# Patient Record
Sex: Female | Born: 1970 | Race: White | Hispanic: No | Marital: Married | Smoking: Current some day smoker
Health system: Southern US, Community
[De-identification: ages and names within clinical notes are randomized; demographics above are authoritative.]

## PROBLEM LIST (undated history)

## (undated) DIAGNOSIS — F419 Anxiety disorder, unspecified: Secondary | ICD-10-CM

## (undated) DIAGNOSIS — F32A Depression, unspecified: Secondary | ICD-10-CM

## (undated) DIAGNOSIS — R197 Diarrhea, unspecified: Secondary | ICD-10-CM

## (undated) DIAGNOSIS — J40 Bronchitis, not specified as acute or chronic: Secondary | ICD-10-CM

## (undated) DIAGNOSIS — R109 Unspecified abdominal pain: Secondary | ICD-10-CM

## (undated) DIAGNOSIS — Z720 Tobacco use: Secondary | ICD-10-CM

## (undated) DIAGNOSIS — J439 Emphysema, unspecified: Secondary | ICD-10-CM

## (undated) DIAGNOSIS — D696 Thrombocytopenia, unspecified: Secondary | ICD-10-CM

## (undated) DIAGNOSIS — R161 Splenomegaly, not elsewhere classified: Secondary | ICD-10-CM

## (undated) DIAGNOSIS — J449 Chronic obstructive pulmonary disease, unspecified: Secondary | ICD-10-CM

## (undated) DIAGNOSIS — Z9289 Personal history of other medical treatment: Secondary | ICD-10-CM

## (undated) DIAGNOSIS — D649 Anemia, unspecified: Secondary | ICD-10-CM

## (undated) DIAGNOSIS — K219 Gastro-esophageal reflux disease without esophagitis: Secondary | ICD-10-CM

## (undated) DIAGNOSIS — F329 Major depressive disorder, single episode, unspecified: Secondary | ICD-10-CM

## (undated) DIAGNOSIS — D469 Myelodysplastic syndrome, unspecified: Secondary | ICD-10-CM

## (undated) HISTORY — DX: Unspecified abdominal pain: R10.9

## (undated) HISTORY — PX: WISDOM TOOTH EXTRACTION: SHX21

## (undated) HISTORY — DX: Personal history of other medical treatment: Z92.89

## (undated) HISTORY — PX: UPPER GASTROINTESTINAL ENDOSCOPY: SHX188

## (undated) HISTORY — PX: GASTRIC BYPASS: SHX52

## (undated) HISTORY — DX: Bronchitis, not specified as acute or chronic: J40

## (undated) HISTORY — DX: Thrombocytopenia, unspecified: D69.6

## (undated) HISTORY — DX: Anemia, unspecified: D64.9

## (undated) HISTORY — DX: Anxiety disorder, unspecified: F41.9

## (undated) HISTORY — DX: Diarrhea, unspecified: R19.7

## (undated) HISTORY — PX: CHOLECYSTECTOMY: SHX55

## (undated) HISTORY — DX: Tobacco use: Z72.0

## (undated) HISTORY — PX: OTHER SURGICAL HISTORY: SHX169

## (undated) HISTORY — PX: BONE MARROW TRANSPLANT: SHX200

## (undated) HISTORY — PX: ABDOMINAL HYSTERECTOMY: SHX81

## (undated) HISTORY — DX: Myelodysplastic syndrome, unspecified: D46.9

## (undated) HISTORY — DX: Splenomegaly, not elsewhere classified: R16.1

---

## 2011-09-06 DIAGNOSIS — D219 Benign neoplasm of connective and other soft tissue, unspecified: Secondary | ICD-10-CM | POA: Insufficient documentation

## 2011-09-06 DIAGNOSIS — Z9884 Bariatric surgery status: Secondary | ICD-10-CM | POA: Insufficient documentation

## 2011-09-06 DIAGNOSIS — N83209 Unspecified ovarian cyst, unspecified side: Secondary | ICD-10-CM | POA: Insufficient documentation

## 2011-11-15 DIAGNOSIS — Z90710 Acquired absence of both cervix and uterus: Secondary | ICD-10-CM | POA: Insufficient documentation

## 2012-05-17 DIAGNOSIS — K911 Postgastric surgery syndromes: Secondary | ICD-10-CM | POA: Insufficient documentation

## 2012-05-17 DIAGNOSIS — Z833 Family history of diabetes mellitus: Secondary | ICD-10-CM | POA: Insufficient documentation

## 2013-04-12 ENCOUNTER — Encounter (HOSPITAL_COMMUNITY): Payer: Self-pay | Admitting: Emergency Medicine

## 2013-04-12 ENCOUNTER — Emergency Department (HOSPITAL_COMMUNITY)
Admission: EM | Admit: 2013-04-12 | Discharge: 2013-04-12 | Disposition: A | Discharge status: Home / Self Care | Payer: Medicaid Other | Attending: Emergency Medicine | Admitting: Emergency Medicine

## 2013-04-12 DIAGNOSIS — R45851 Suicidal ideations: Secondary | ICD-10-CM | POA: Insufficient documentation

## 2013-04-12 DIAGNOSIS — J449 Chronic obstructive pulmonary disease, unspecified: Secondary | ICD-10-CM | POA: Insufficient documentation

## 2013-04-12 DIAGNOSIS — F1010 Alcohol abuse, uncomplicated: Secondary | ICD-10-CM

## 2013-04-12 DIAGNOSIS — F329 Major depressive disorder, single episode, unspecified: Secondary | ICD-10-CM

## 2013-04-12 DIAGNOSIS — F3289 Other specified depressive episodes: Secondary | ICD-10-CM | POA: Insufficient documentation

## 2013-04-12 DIAGNOSIS — F32A Depression, unspecified: Secondary | ICD-10-CM

## 2013-04-12 DIAGNOSIS — K219 Gastro-esophageal reflux disease without esophagitis: Secondary | ICD-10-CM | POA: Insufficient documentation

## 2013-04-12 DIAGNOSIS — J4489 Other specified chronic obstructive pulmonary disease: Secondary | ICD-10-CM | POA: Insufficient documentation

## 2013-04-12 DIAGNOSIS — Z9884 Bariatric surgery status: Secondary | ICD-10-CM | POA: Insufficient documentation

## 2013-04-12 DIAGNOSIS — F17200 Nicotine dependence, unspecified, uncomplicated: Secondary | ICD-10-CM | POA: Insufficient documentation

## 2013-04-12 DIAGNOSIS — Z79899 Other long term (current) drug therapy: Secondary | ICD-10-CM | POA: Insufficient documentation

## 2013-04-12 HISTORY — DX: Emphysema, unspecified: J43.9

## 2013-04-12 HISTORY — DX: Major depressive disorder, single episode, unspecified: F32.9

## 2013-04-12 HISTORY — DX: Depression, unspecified: F32.A

## 2013-04-12 HISTORY — DX: Chronic obstructive pulmonary disease, unspecified: J44.9

## 2013-04-12 HISTORY — DX: Gastro-esophageal reflux disease without esophagitis: K21.9

## 2013-04-12 LAB — URINALYSIS, ROUTINE W REFLEX MICROSCOPIC
Leukocytes, UA: NEGATIVE
Nitrite: NEGATIVE
Protein, ur: NEGATIVE mg/dL
Specific Gravity, Urine: 1.011 (ref 1.005–1.030)
Urobilinogen, UA: 0.2 mg/dL (ref 0.0–1.0)

## 2013-04-12 LAB — COMPREHENSIVE METABOLIC PANEL WITH GFR
ALT: 15 U/L (ref 0–35)
AST: 28 U/L (ref 0–37)
Albumin: 3.8 g/dL (ref 3.5–5.2)
Alkaline Phosphatase: 88 U/L (ref 39–117)
BUN: 8 mg/dL (ref 6–23)
CO2: 24 meq/L (ref 19–32)
Calcium: 8.6 mg/dL (ref 8.4–10.5)
Chloride: 100 meq/L (ref 96–112)
Creatinine, Ser: 0.63 mg/dL (ref 0.50–1.10)
GFR calc Af Amer: >90 mL/min
GFR calc non Af Amer: >90 mL/min
Glucose, Bld: 98 mg/dL (ref 70–99)
Potassium: 3.6 meq/L (ref 3.5–5.1)
Sodium: 135 meq/L (ref 135–145)
Total Bilirubin: 0.2 mg/dL — ABNORMAL LOW (ref 0.3–1.2)
Total Protein: 7.4 g/dL (ref 6.0–8.3)

## 2013-04-12 LAB — RAPID URINE DRUG SCREEN, HOSP PERFORMED
Amphetamines: NOT DETECTED
Barbiturates: NOT DETECTED
Benzodiazepines: NOT DETECTED
Cocaine: NOT DETECTED
Opiates: NOT DETECTED
Tetrahydrocannabinol: NOT DETECTED

## 2013-04-12 LAB — CBC
HCT: 38.7 % (ref 36.0–46.0)
Hemoglobin: 12.2 g/dL (ref 12.0–15.0)
MCH: 24 pg — ABNORMAL LOW (ref 26.0–34.0)
MCHC: 31.5 g/dL (ref 30.0–36.0)
MCV: 76.2 fL — ABNORMAL LOW (ref 78.0–100.0)
Platelets: 211 K/uL (ref 150–400)
RBC: 5.08 MIL/uL (ref 3.87–5.11)
RDW: 15.5 % (ref 11.5–15.5)
WBC: 5.9 K/uL (ref 4.0–10.5)

## 2013-04-12 MED ORDER — ZOLPIDEM TARTRATE 5 MG PO TABS: 5.0000 mg | ORAL_TABLET | Freq: Every evening | ORAL | Status: DC | PRN

## 2013-04-12 MED ORDER — NICOTINE 21 MG/24HR TD PT24: 21.0000 mg | MEDICATED_PATCH | Freq: Every day | TRANSDERMAL | Status: DC

## 2013-04-12 MED ORDER — ALUM & MAG HYDROXIDE-SIMETH 200-200-20 MG/5ML PO SUSP: 30.0000 mL | ORAL | Status: DC | PRN

## 2013-04-12 MED ORDER — IBUPROFEN 600 MG PO TABS: 600.0000 mg | ORAL_TABLET | Freq: Three times a day (TID) | ORAL | Status: DC | PRN

## 2013-04-12 MED ORDER — ACETAMINOPHEN 325 MG PO TABS: 650.0000 mg | ORAL_TABLET | ORAL | Status: DC | PRN

## 2013-04-12 MED ORDER — ONDANSETRON HCL 4 MG PO TABS: 4.0000 mg | ORAL_TABLET | Freq: Three times a day (TID) | ORAL | Status: DC | PRN

## 2013-04-12 MED ORDER — LORAZEPAM 1 MG PO TABS
1.0000 mg | ORAL_TABLET | Freq: Three times a day (TID) | ORAL | Status: DC | PRN
  Administered 2013-04-12: 1 mg via ORAL
  Filled 2013-04-12: qty 1

## 2013-04-12 NOTE — ED Provider Notes (Signed)
Medical screening examination/treatment/procedure(s) were performed by non-physician practitioner and as supervising physician I was immediately available for consultation/collaboration.  Martha K Linker, MD 04/12/13 0633 

## 2013-04-12 NOTE — Progress Notes (Signed)
Chaplain distressed and anxious after making phone call.  Had spoken with boyfriend, who wishes her to leave children with friend and spend time in [place].  Pt does not wish to go to [place].   Chaplain provided support around pt's upcoming decision and conversation with boyfriend.  Spoke with chaplain about history of behavioral health, having been in psychiatric hospitals in [place] from childhood through 48 y.  Pt described being on a number of medications.  Had stopped medication at 30 and felt she had been healthier.  Now back on medication, she is fearful that "this will snowball" and she will have to take more medications.  Has spoken with therapist about this.  Expressed fears that "the person that I was came out again last night."   Pt spoke with chaplain about her new faith and where this fits into discerning her next steps.    Belva Crome MDiv.

## 2013-04-12 NOTE — ED Notes (Signed)
Patient arrived to unit. Pt denies SI/HI but states "I was drinking and I said some stupid things to my pastor, things I shouldn't have". Pt states she made statements that may have scared him and he called [employer]. Pt asking to leave asap, and that she is "sober now".

## 2013-04-12 NOTE — Consult Note (Signed)
[facility] Psychiatry Consult   Reason for Consult:Ms Barbara Stanley drank 4-5 drinks with a friend last night on top of taking Latuda and had a bad reaction blacking out and making suicidal threats. Referring Physician:  ER physician  Shaneese Tait is an 42 y.o. female.  Assessment: AXIS I:  Alcohol Abuse AXIS II:  Deferred AXIS III:   Past Medical History  Diagnosis Date  . Depression   . COPD (chronic obstructive pulmonary disease)   . Emphysema   . GERD (gastroesophageal reflux disease)    AXIS IV:  other psychosocial or environmental problems AXIS V:  61-70 mild symptoms  Plan:  No evidence of imminent risk to self or others at present.   Patient does not meet criteria for psychiatric inpatient admission. Supportive therapy provided about ongoing stressors. Discussed crisis plan, support from social network, calling 911, coming to the Emergency Department, and calling Suicide Hotline.  Subjective:   Barbara Stanley is a 42 y.o. female patient admitted with having been drinking and made suicidal threats she cannot recall making.  HPI:  Ms Barbara Stanley related the story above.  Said years ago she was a problem drinker but stopped 12 years ago.  Last night she did not plan to drink that much but after a few drinks she does not recall all that happened.  Has no recall of making suicidal threats but does recall talking to her pastor.  Today she says she has absolutely no desire to kill herself and feels very bad about what happened.  She has been diagnosed with Bipolar DO and the Latuda has been helpful.  She has children and a supportive boyfriend and wants to be discharged. HPI Elements:   Location:  ER. Quality:  normally does not drink. Severity:  severe response to the combination of meds and alcohol. Timing:  a one time thing that will not happen again, she says. Duration:  has not drunk for 12 yrs. Context:  has been stressed recently.  Past Psychiatric History: Past Medical History   Diagnosis Date  . Depression   . COPD (chronic obstructive pulmonary disease)   . Emphysema   . GERD (gastroesophageal reflux disease)     reports that she has been smoking Cigarettes.  She has been smoking about 0.00 packs per day. She does not have any smokeless tobacco history on file. She reports that  drinks alcohol. She reports that she does not use illicit drugs. Family History  Problem Relation Age of Onset  . Diabetes Mother   . Diabetes Other   . Hypertension Other   . CAD Other            Allergies:  No Known Allergies  Past Psychiatric History: Diagnosis:  Alcohol abuse  Hospitalizations:  When younger, none for 15 years  Outpatient Care:  Sees and provider and takes Jordan  Substance Abuse Care:  none  Self-Mutilation:  none  Suicidal Attempts:  none  Violent Behaviors:  none   Objective: Blood pressure 121/77, pulse 77, temperature 97.6 F (36.4 C), temperature source Oral, resp. rate 16, SpO2 100.00%.There is no height or weight on file to calculate BMI. Results for orders placed during the hospital encounter of 04/12/13 (from the past 72 hour(s))  URINE RAPID DRUG SCREEN (HOSP PERFORMED)     Status: None   Collection Time    04/12/13  3:28 AM      Result Value Range   Opiates NONE DETECTED  NONE DETECTED   Cocaine NONE DETECTED  NONE  DETECTED   Benzodiazepines NONE DETECTED  NONE DETECTED   Amphetamines NONE DETECTED  NONE DETECTED   Tetrahydrocannabinol NONE DETECTED  NONE DETECTED   Barbiturates NONE DETECTED  NONE DETECTED   Comment:            DRUG SCREEN FOR MEDICAL PURPOSES     ONLY.  IF CONFIRMATION IS NEEDED     FOR ANY PURPOSE, NOTIFY LAB     WITHIN 5 DAYS.                LOWEST DETECTABLE LIMITS     FOR URINE DRUG SCREEN     Drug Class       Cutoff (ng/mL)     Amphetamine      1000     Barbiturate      200     Benzodiazepine   200     Tricyclics       300     Opiates          300     Cocaine          300     THC              50   URINALYSIS, ROUTINE W REFLEX MICROSCOPIC     Status: None   Collection Time    04/12/13  3:28 AM      Result Value Range   Color, Urine YELLOW  YELLOW   APPearance CLEAR  CLEAR   Specific Gravity, Urine 1.011  1.005 - 1.030   pH 5.5  5.0 - 8.0   Glucose, UA NEGATIVE  NEGATIVE mg/dL   Hgb urine dipstick NEGATIVE  NEGATIVE   Bilirubin Urine NEGATIVE  NEGATIVE   Ketones, ur NEGATIVE  NEGATIVE mg/dL   Protein, ur NEGATIVE  NEGATIVE mg/dL   Urobilinogen, UA 0.2  0.0 - 1.0 mg/dL   Nitrite NEGATIVE  NEGATIVE   Leukocytes, UA NEGATIVE  NEGATIVE   Comment: MICROSCOPIC NOT DONE ON URINES WITH NEGATIVE PROTEIN, BLOOD, LEUKOCYTES, NITRITE, OR GLUCOSE <1000 mg/dL.  CBC     Status: Abnormal   Collection Time    04/12/13  4:02 AM      Result Value Range   WBC 5.9  4.0 - 10.5 K/uL   RBC 5.08  3.87 - 5.11 MIL/uL   Hemoglobin 12.2  12.0 - 15.0 g/dL   HCT 16.1  09.6 - 04.5 %   MCV 76.2 (*) 78.0 - 100.0 fL   MCH 24.0 (*) 26.0 - 34.0 pg   MCHC 31.5  30.0 - 36.0 g/dL   RDW 40.9  81.1 - 91.4 %   Platelets 211  150 - 400 K/uL  COMPREHENSIVE METABOLIC PANEL     Status: Abnormal   Collection Time    04/12/13  4:02 AM      Result Value Range   Sodium 135  135 - 145 mEq/L   Potassium 3.6  3.5 - 5.1 mEq/L   Chloride 100  96 - 112 mEq/L   CO2 24  19 - 32 mEq/L   Glucose, Bld 98  70 - 99 mg/dL   BUN 8  6 - 23 mg/dL   Creatinine, Ser 7.82  0.50 - 1.10 mg/dL   Calcium 8.6  8.4 - 95.6 mg/dL   Total Protein 7.4  6.0 - 8.3 g/dL   Albumin 3.8  3.5 - 5.2 g/dL   AST 28  0 - 37 U/L   ALT 15  0 - 35 U/L  Alkaline Phosphatase 88  39 - 117 U/L   Total Bilirubin 0.2 (*) 0.3 - 1.2 mg/dL   GFR calc non Af Amer >90  >90 mL/min   GFR calc Af Amer >90  >90 mL/min   Comment:            The eGFR has been calculated     using the CKD EPI equation.     This calculation has not been     validated in all clinical     situations.     eGFR's persistently     <90 mL/min signify     possible Chronic Kidney  Disease.  ETHANOL     Status: Abnormal   Collection Time    04/12/13  4:02 AM      Result Value Range   Alcohol, Ethyl (B) 119 (*) 0 - 11 mg/dL   Comment:            LOWEST DETECTABLE LIMIT FOR     SERUM ALCOHOL IS 11 mg/dL     FOR MEDICAL PURPOSES ONLY   Labs are reviewed and are pertinent for any psychiatric issue.  Current Facility-Administered Medications  Medication Dose Route Frequency Provider Last Rate Last Dose  . acetaminophen (TYLENOL) tablet 650 mg  650 mg Oral Q4H PRN Antony Madura, PA-C      . alum & mag hydroxide-simeth (MAALOX/MYLANTA) 200-200-20 MG/5ML suspension 30 mL  30 mL Oral PRN Antony Madura, PA-C      . ibuprofen (ADVIL,MOTRIN) tablet 600 mg  600 mg Oral Q8H PRN Antony Madura, PA-C      . LORazepam (ATIVAN) tablet 1 mg  1 mg Oral Q8H PRN Antony Madura, PA-C      . nicotine (NICODERM CQ - dosed in mg/24 hours) patch 21 mg  21 mg Transdermal Daily Antony Madura, PA-C      . ondansetron [facility]) tablet 4 mg  4 mg Oral Q8H PRN Antony Madura, PA-C      . zolpidem (AMBIEN) tablet 5 mg  5 mg Oral QHS PRN Antony Madura, PA-C       Current Outpatient Prescriptions  Medication Sig Dispense Refill  . lurasidone (LATUDA) 40 MG TABS Take 40 mg by mouth daily with breakfast.      . omeprazole (PRILOSEC) 20 MG capsule Take 20 mg by mouth daily.        Psychiatric Specialty Exam: Physical Exam  ROS  Blood pressure 121/77, pulse 77, temperature 97.6 F (36.4 C), temperature source Oral, resp. rate 16, SpO2 100.00%.There is no height or weight on file to calculate BMI.  General Appearance: Fairly Groomed  Patent attorney::  Good  Speech:  Clear and Coherent and Normal Rate  Volume:  Normal  Mood:  Euthymic  Affect:  Appropriate  Thought Process:  Coherent and Goal Directed  Orientation:  Full (Time, Place, and Person)  Thought Content:  Negative  Suicidal Thoughts:  No  Homicidal Thoughts:  No  Memory:  Immediate;   Good Recent;   Good Remote;   Good  Judgement:  Good  Insight:   Good  Psychomotor Activity:  Normal  Concentration:  Good  Recall:  Good  Akathisia:  Negative  Handed:  Right  AIMS (if indicated):     Assets:  Communication Skills Desire for Improvement Financial Resources/Insurance Housing Intimacy Leisure Time Physical Health Resilience Social Support Transportation Vocational/Educational  Sleep:      Treatment Plan Summary: Daily contact with patient to assess and evaluate symptoms and progress  in treatment Medication management she will follow up with her outpt provider  TAYLOR,GERALD D 04/12/2013 9:27 AM

## 2013-04-12 NOTE — ED Provider Notes (Signed)
Transfer of care from Antony Madura, PA-C at 6:19AM. Barbara Stanley is a 41 y/o F with PMHx of depression, COPD, GERD presenting to the ED with suicidal thoughts. Patient in psych ED at [facility]. Telepsych ordered.   Raymon Mutton, PA-C 04/12/13 1840

## 2013-04-12 NOTE — ED Notes (Signed)
Pt's silver colored watch, $15 and a cell phone locked up in security.

## 2013-04-12 NOTE — ED Notes (Signed)
Pt states she is struggling with depression and is supposed to take medication for it but has been out of her meds for the past several days  Pt states she was talking to her pastor tonight and he called [employer] so they went out to check on the pt and brought her here

## 2013-04-12 NOTE — ED Provider Notes (Signed)
History    CSN: [id number] Arrival date & time 04/12/13  0309  First MD Initiated Contact with Patient 04/12/13 [phone]     Chief Complaint  Patient presents with  . Medical Clearance   (Consider location/radiation/quality/duration/timing/severity/associated sxs/prior Treatment) HPI Comments: Patient is a 42 year old female with a history of depression who presents for thoughts of suicide. Patient states that she was drinking Darrel Reach with one of her neighbors tonight and began to feel depressed. Patient subsequently called her pastor. While she does not recall the exact conversation she remembers having intermittent suicidal thoughts. Pastor contacted [place] [employer] who arrived at the patient's home and brought her to the emergency department. Patient denies current suicidal ideations or suicidal plan. She further denies homicidal ideation. Patient is followed by [facility] psychiatric clinic for depression and is supposed to be taking Jordan, but states she ran out of her samples and has not been able to get a new Rx. Patient denies illicit drug use. She denies any medical or pain complaints.  The history is provided by the patient. No language interpreter was used.   Past Medical History  Diagnosis Date  . Depression   . COPD (chronic obstructive pulmonary disease)   . Emphysema   . GERD (gastroesophageal reflux disease)    Past Surgical History  Procedure Laterality Date  . C section x 3     . Cholecystectomy    . Gastric bypass    . Abdominal hysterectomy     Family History  Problem Relation Age of Onset  . Diabetes Mother   . Diabetes Other   . Hypertension Other   . CAD Other    History  Substance Use Topics  . Smoking status: Current Every Day Smoker    Types: Cigarettes  . Smokeless tobacco: Not on file  . Alcohol Use: Yes     Comment: occ   OB History   Grav Para Term Preterm Abortions TAB SAB Ect Mult Living                 Review of Systems   Psychiatric/Behavioral: Positive for suicidal ideas.  All other systems reviewed and are negative.    Allergies  Review of patient's allergies indicates no known allergies.  Home Medications   Current Outpatient Rx  Name  Route  Sig  Dispense  Refill  . lurasidone (LATUDA) 40 MG TABS   Oral   Take 40 mg by mouth daily with breakfast.         . omeprazole (PRILOSEC) 20 MG capsule   Oral   Take 20 mg by mouth daily.          BP 127/71  Pulse 100  Temp(Src) 98.5 F (36.9 C) (Oral)  Resp 16  SpO2 93% Physical Exam  Nursing note and vitals reviewed. Constitutional: She is oriented to person, place, and time. She appears well-developed and well-nourished. No distress.  HENT:  Head: Normocephalic and atraumatic.  Mouth/Throat: Oropharynx is clear and moist. No oropharyngeal exudate.  Eyes: Conjunctivae and EOM are normal. Pupils are equal, round, and reactive to light. No scleral icterus.  Neck: Normal range of motion. Neck supple.  Cardiovascular: Normal rate, regular rhythm, normal heart sounds and intact distal pulses.   Pulmonary/Chest: Effort normal and breath sounds normal. No respiratory distress. She has no wheezes. She has no rales.  Abdominal: Soft. She exhibits no distension. There is no tenderness. There is no rebound and no guarding.  Musculoskeletal: Normal range of motion.  Lymphadenopathy:    She has no cervical adenopathy.  Neurological: She is alert and oriented to person, place, and time.  Skin: Skin is warm and dry. No rash noted. She is not diaphoretic. No erythema. No pallor.  Psychiatric: She has a normal mood and affect. Her behavior is normal.    ED Course  Procedures (including critical care time) Labs Reviewed  CBC - Abnormal; Notable for the following:    MCV 76.2 (*)    MCH 24.0 (*)    All other components within normal limits  COMPREHENSIVE METABOLIC PANEL - Abnormal; Notable for the following:    Total Bilirubin 0.2 (*)    All other  components within normal limits  ETHANOL - Abnormal; Notable for the following:    Alcohol, Ethyl (B) 119 (*)    All other components within normal limits  URINE RAPID DRUG SCREEN (HOSP PERFORMED)  URINALYSIS, ROUTINE W REFLEX MICROSCOPIC   No results found. No diagnosis found.  MDM  Patient presents via [place] [employer] for suicidal thoughts tonight. Patient has history of depression and is currently not taking Latuda for symptoms. Patient followed by [facility] psychiatric clinic for behavioral health history. Patient denies current suicidal ideation or plan and homicidal ideation. She has no medical or pain complaints and physical exam without significant findings. Patient medically cleared. Consult placed to psychiatry for further evaluation of suicidal ideation.  Antony Madura, PA-C 04/12/13 [phone]

## 2013-04-12 NOTE — ED Notes (Signed)
Chaplain saw pt on referral of nursing while rounding in ED.  Introduced spiritual care as resource.   Pt spoke with chaplain about remorse around her actions.  Also spoke about concerns around relationship with boyfriend, who she did not feel was supportive when she was admitted to ED.  Spoke with chaplain about how to communicate unmet needs with boyfriend.  Expressed fear and uncertainty.  Boyfriend is in [place] working on Database administrator.  Is moving to [place] next and wishes pt to go with him.  Pt cannot leave state with son and does not want to give up custody of son.  Provided emotional and spiritual support.

## 2013-04-16 NOTE — ED Provider Notes (Signed)
Medical screening examination/treatment/procedure(s) were performed by non-physician practitioner and as supervising physician I was immediately available for consultation/collaboration.   Richardean Canal, MD 04/16/13 [phone]

## 2013-09-24 ENCOUNTER — Encounter: Payer: Self-pay | Admitting: *Deleted

## 2013-09-24 ENCOUNTER — Encounter: Payer: Self-pay | Admitting: Internal Medicine

## 2013-09-25 ENCOUNTER — Institutional Professional Consult (permissible substitution): Payer: Self-pay | Admitting: Internal Medicine

## 2013-09-28 ENCOUNTER — Encounter: Payer: Self-pay | Admitting: Interventional Cardiology

## 2013-09-28 DIAGNOSIS — J449 Chronic obstructive pulmonary disease, unspecified: Secondary | ICD-10-CM | POA: Insufficient documentation

## 2013-09-28 DIAGNOSIS — J439 Emphysema, unspecified: Secondary | ICD-10-CM | POA: Insufficient documentation

## 2013-09-28 DIAGNOSIS — F32A Depression, unspecified: Secondary | ICD-10-CM | POA: Insufficient documentation

## 2013-09-28 DIAGNOSIS — F329 Major depressive disorder, single episode, unspecified: Secondary | ICD-10-CM | POA: Insufficient documentation

## 2013-09-28 DIAGNOSIS — D649 Anemia, unspecified: Secondary | ICD-10-CM | POA: Insufficient documentation

## 2013-09-28 DIAGNOSIS — K219 Gastro-esophageal reflux disease without esophagitis: Secondary | ICD-10-CM | POA: Insufficient documentation

## 2013-09-30 ENCOUNTER — Ambulatory Visit (INDEPENDENT_AMBULATORY_CARE_PROVIDER_SITE_OTHER): Payer: Medicaid Other | Admitting: Interventional Cardiology

## 2013-09-30 ENCOUNTER — Encounter: Payer: Self-pay | Admitting: Cardiology

## 2013-09-30 ENCOUNTER — Encounter: Payer: Self-pay | Admitting: Interventional Cardiology

## 2013-09-30 VITALS — BP 117/82 | HR 88 | Ht 68.0 in | Wt 265.8 lb

## 2013-09-30 DIAGNOSIS — F17200 Nicotine dependence, unspecified, uncomplicated: Secondary | ICD-10-CM

## 2013-09-30 DIAGNOSIS — R0602 Shortness of breath: Secondary | ICD-10-CM

## 2013-09-30 DIAGNOSIS — R55 Syncope and collapse: Secondary | ICD-10-CM

## 2013-09-30 DIAGNOSIS — R9430 Abnormal result of cardiovascular function study, unspecified: Secondary | ICD-10-CM | POA: Insufficient documentation

## 2013-09-30 LAB — BASIC METABOLIC PANEL
Calcium: 8.9 mg/dL (ref 8.4–10.5)
Creatinine, Ser: 0.8 mg/dL (ref 0.4–1.2)
GFR: 88.45 mL/min (ref >60.00)
Sodium: 139 mEq/L (ref 135–145)

## 2013-09-30 NOTE — Patient Instructions (Signed)
Your physician has requested that you have an echocardiogram. Echocardiography is a painless test that uses sound waves to create images of your heart. It provides your doctor with information about the size and shape of your heart and how well your heart's chambers and valves are working. This procedure takes approximately one hour. There are no restrictions for this procedure.  Your physician recommends that you return for lab work today for BNP and BMet.  Your physician wants you to follow-up in: 6 months with Dr. Eldridge Dace. You will receive a reminder letter in the mail two months in advance. If you don't receive a letter, please call our office to schedule the follow-up appointment.

## 2013-09-30 NOTE — Progress Notes (Signed)
Patient ID: Barbara Stanley, female   DOB: 12-08-1970, 42 y.o.   MRN: [id number]     Patient ID: Barbara Stanley MRN: [id number] DOB/AGE: 01/10/1971 42 y.o.   Referring Physician: Dr. Marchelle Gearing    Reason for Consultation: Abnormal stress test  HPI: 42 y/o who has had an abnormal stress test in 1/14.  There was a possible mild inferior defect.  This was managed medically.  SHe had several syncopal episodes prior to the stress test.  THey occurred with overexcitement per her report.  One episode during sex.  She had one near syncopal episode after walking.  No falling or hurting herself while passing out.   One episode of syncope during intercourse more recently.  Mother and father waith heart issues.  She thinks the father may have had revascularization.  She is trying to cut back on smoking.  SHe has not been successful in the past.    She fels more SHOB with exertion.  SHe coughs a lot.  She has some SHOB with lying.  Inhalers give some relief.  This is her motivation to stop smoking.  Occasional chest pain with anger.  No exertional chest pain.    She drinks water but drinks a lot of coffee.     Current Outpatient Prescriptions  Medication Sig Dispense Refill  . albuterol (PROAIR HFA) 108 (90 BASE) MCG/ACT inhaler Inhale 2 puffs into the lungs every 6 (six) hours as needed for wheezing or shortness of breath.      . budesonide-formoterol (SYMBICORT) 160-4.5 MCG/ACT inhaler Inhale 2 puffs into the lungs 2 (two) times daily.      [name] esomeprazole (NEXIUM) 40 MG capsule Take 40 mg by mouth daily at 12 noon.      [name] omeprazole (PRILOSEC) 20 MG capsule Take 20 mg by mouth as needed.       . tiotropium (SPIRIVA) 18 MCG inhalation capsule Place 18 mcg into inhaler and inhale daily.       No current facility-administered medications for this visit.   Past Medical History  Diagnosis Date  . Depression   . COPD (chronic obstructive pulmonary disease)   . Emphysema   . GERD (gastroesophageal  reflux disease)   . Anemia   . Tobacco abuse     Family History  Problem Relation Age of Onset  . Diabetes Mother   . Diabetes Other   . Hypertension Other   . CAD Other     History   Social History  . Marital Status: Single    Spouse Name: N/A    Number of Children: N/A  . Years of Education: N/A   Occupational History  . Not on file.   Social History Main Topics  . Smoking status: Current Every Day Smoker    Types: Cigarettes  . Smokeless tobacco: Not on file  . Alcohol Use: Yes     Comment: occ  . Drug Use: No  . Sexual Activity: Not on file   Other Topics Concern  . Not on file   Social History Narrative  . No narrative on file    Past Surgical History  Procedure Laterality Date  . C section x 3     . Cholecystectomy    . Gastric bypass    . Abdominal hysterectomy        (Not in a hospital admission)  Review of systems complete and found to be negative unless listed above .  No nausea, vomiting.  No fever chills, No focal weakness,  No palpitations.  Physical Exam: Filed Vitals:   09/30/13 1155  BP: 117/82  Pulse: 88    Weight: 265 lb 12.8 oz (120.566 kg)  Physical exam:  Vian/AT EOMI No JVD, No carotid bruit RRR S1S2  No wheezing Soft. NT, nondistended No edema. No focal motor or sensory deficits Normal affect  Labs:   Lab Results  Component Value Date   WBC 5.9 04/12/2013   HGB 12.2 04/12/2013   HCT 38.7 04/12/2013   MCV 76.2* 04/12/2013   PLT 211 04/12/2013   No results found for this basename: NA, K, CL, CO2, BUN, CREATININE, CALCIUM, LABALBU, PROT, BILITOT, ALKPHOS, ALT, AST, GLUCOSE,  in the last 168 hours No results found for this basename: CKTOTAL, CKMB, CKMBINDEX, TROPONINI    No results found for this basename: CHOL   No results found for this basename: HDL   No results found for this basename: LDLCALC   No results found for this basename: TRIG   No results found for this basename: CHOLHDL   No results found for this  basename: LDLDIRECT       [id number]:[id number]  ASSESSMENT AND PLAN:  Syncope:  Unclear etiology. Both episodes have occurred during intercourse. I encouraged her to stay well hydrated. She does drink a lot of caffeine which may dehydrate her.  Check echocardiogram to evaluate for structural heart disease.  Abnormal stress test: I reviewed her stress test results from [place] performed in January 2014. There is a question of a basal inferior wall defect, but the reader felt it was more likely attenuation artifact. She is not having symptoms of ischemia. I would not pursue further workup at this time.  Tobaco abuse: I strongly recommended that she needs systolic smoking.  SHOB: Likely multifactorial. Will evaluate for structural heart disease with echocardiogram. Check BNP to evaluate for fluid overload.  Signed:   Fredric Mare, MD, [facility] 09/30/2013, 12:11 PM

## 2013-10-01 LAB — BRAIN NATRIURETIC PEPTIDE: Pro B Natriuretic peptide (BNP): 22 pg/mL (ref 0.0–100.0)

## 2013-10-03 ENCOUNTER — Encounter: Payer: Self-pay | Admitting: Cardiology

## 2013-10-03 ENCOUNTER — Ambulatory Visit (HOSPITAL_COMMUNITY): Payer: Medicaid Other | Attending: Cardiology | Admitting: Radiology

## 2013-10-03 DIAGNOSIS — R0602 Shortness of breath: Secondary | ICD-10-CM

## 2013-10-03 DIAGNOSIS — J4489 Other specified chronic obstructive pulmonary disease: Secondary | ICD-10-CM | POA: Insufficient documentation

## 2013-10-03 DIAGNOSIS — J449 Chronic obstructive pulmonary disease, unspecified: Secondary | ICD-10-CM | POA: Insufficient documentation

## 2013-10-03 DIAGNOSIS — Z87891 Personal history of nicotine dependence: Secondary | ICD-10-CM | POA: Insufficient documentation

## 2013-10-03 NOTE — Progress Notes (Signed)
Echocardiogram performed.  

## 2013-10-04 ENCOUNTER — Encounter (HOSPITAL_COMMUNITY): Payer: Self-pay | Admitting: Emergency Medicine

## 2013-10-04 ENCOUNTER — Emergency Department (INDEPENDENT_AMBULATORY_CARE_PROVIDER_SITE_OTHER)
Admission: EM | Admit: 2013-10-04 | Discharge: 2013-10-04 | Disposition: A | Discharge status: Home / Self Care | Payer: Medicaid Other | Source: Home / Self Care | Attending: Family Medicine | Admitting: Family Medicine

## 2013-10-04 DIAGNOSIS — M6740 Ganglion, unspecified site: Secondary | ICD-10-CM

## 2013-10-04 NOTE — ED Notes (Signed)
Pt  Reports  l  Foot  Pain    X  sev weeks         denys  Any  Injury  She  Has  Some  Swelling  To  The  Top   Of        foot

## 2013-10-04 NOTE — ED Provider Notes (Signed)
CSN: [id number]     Arrival date & time 10/04/13  0957 History   First MD Initiated Contact with Patient 10/04/13 1050     Chief Complaint  Patient presents with  . Foot Pain   (Consider location/radiation/quality/duration/timing/severity/associated sxs/prior Treatment) Patient is a 42 y.o. female presenting with lower extremity pain. The history is provided by the patient.  Foot Pain This is a new problem. The current episode started more than 1 week ago. The problem has not changed since onset.The symptoms are aggravated by walking.    Past Medical History  Diagnosis Date  . Depression   . COPD (chronic obstructive pulmonary disease)   . Emphysema   . GERD (gastroesophageal reflux disease)   . Anemia   . Tobacco abuse    Past Surgical History  Procedure Laterality Date  . C section x 3     . Cholecystectomy    . Gastric bypass    . Abdominal hysterectomy     Family History  Problem Relation Age of Onset  . Diabetes Mother   . Diabetes Other   . Hypertension Other   . CAD Other    History  Substance Use Topics  . Smoking status: Current Every Day Smoker    Types: Cigarettes  . Smokeless tobacco: Not on file  . Alcohol Use: Yes     Comment: occ   OB History   Grav Para Term Preterm Abortions TAB SAB Ect Mult Living                 Review of Systems  Constitutional: Negative.   Musculoskeletal: Positive for gait problem. Negative for joint swelling and myalgias.    Allergies  Review of patient's allergies indicates no known allergies.  Home Medications   Current Outpatient Rx  Name  Route  Sig  Dispense  Refill  . albuterol (PROAIR HFA) 108 (90 BASE) MCG/ACT inhaler   Inhalation   Inhale 2 puffs into the lungs every 6 (six) hours as needed for wheezing or shortness of breath.         . budesonide-formoterol (SYMBICORT) 160-4.5 MCG/ACT inhaler   Inhalation   Inhale 2 puffs into the lungs 2 (two) times daily.         [name] esomeprazole (NEXIUM) 40 MG  capsule   Oral   Take 40 mg by mouth daily at 12 noon.         [name] omeprazole (PRILOSEC) 20 MG capsule   Oral   Take 20 mg by mouth as needed.          . tiotropium (SPIRIVA) 18 MCG inhalation capsule   Inhalation   Place 18 mcg into inhaler and inhale daily.          There were no vitals taken for this visit. Physical Exam  Nursing note and vitals reviewed. Constitutional: She is oriented to person, place, and time. She appears well-developed and well-nourished.  Musculoskeletal: She exhibits tenderness.       Feet:  Neurological: She is alert and oriented to person, place, and time.  Skin: Skin is warm and dry.    ED Course  Procedures (including critical care time) Labs Review Labs Reviewed - No data to display Imaging Review No results found.  EKG Interpretation    Date/Time:    Ventricular Rate:    PR Interval:    QRS Duration:   QT Interval:    QTC Calculation:   R Axis:     Text Interpretation:  MDM     Linna Hoff, MD 10/04/13 [phone]

## 2013-10-08 ENCOUNTER — Institutional Professional Consult (permissible substitution): Payer: Self-pay | Admitting: Internal Medicine

## 2013-11-11 ENCOUNTER — Institutional Professional Consult (permissible substitution): Payer: Self-pay | Admitting: Internal Medicine

## 2013-11-14 ENCOUNTER — Encounter: Payer: Self-pay | Admitting: Internal Medicine

## 2014-03-20 ENCOUNTER — Emergency Department (HOSPITAL_COMMUNITY)
Admission: EM | Admit: 2014-03-20 | Discharge: 2014-03-21 | Disposition: A | Discharge status: Other Acute Inpatient Hospital | Payer: Medicaid Other | Attending: Emergency Medicine | Admitting: Emergency Medicine

## 2014-03-20 ENCOUNTER — Encounter (HOSPITAL_COMMUNITY): Payer: Self-pay | Admitting: Emergency Medicine

## 2014-03-20 DIAGNOSIS — F17200 Nicotine dependence, unspecified, uncomplicated: Secondary | ICD-10-CM | POA: Insufficient documentation

## 2014-03-20 DIAGNOSIS — R45851 Suicidal ideations: Secondary | ICD-10-CM

## 2014-03-20 DIAGNOSIS — J4489 Other specified chronic obstructive pulmonary disease: Secondary | ICD-10-CM | POA: Insufficient documentation

## 2014-03-20 DIAGNOSIS — T50992A Poisoning by other drugs, medicaments and biological substances, intentional self-harm, initial encounter: Secondary | ICD-10-CM | POA: Insufficient documentation

## 2014-03-20 DIAGNOSIS — F339 Major depressive disorder, recurrent, unspecified: Secondary | ICD-10-CM | POA: Diagnosis present

## 2014-03-20 DIAGNOSIS — T1491XA Suicide attempt, initial encounter: Secondary | ICD-10-CM | POA: Diagnosis present

## 2014-03-20 DIAGNOSIS — T426X1A Poisoning by other antiepileptic and sedative-hypnotic drugs, accidental (unintentional), initial encounter: Secondary | ICD-10-CM | POA: Insufficient documentation

## 2014-03-20 DIAGNOSIS — F329 Major depressive disorder, single episode, unspecified: Secondary | ICD-10-CM | POA: Insufficient documentation

## 2014-03-20 DIAGNOSIS — J449 Chronic obstructive pulmonary disease, unspecified: Secondary | ICD-10-CM | POA: Insufficient documentation

## 2014-03-20 DIAGNOSIS — Z79899 Other long term (current) drug therapy: Secondary | ICD-10-CM | POA: Insufficient documentation

## 2014-03-20 DIAGNOSIS — K219 Gastro-esophageal reflux disease without esophagitis: Secondary | ICD-10-CM | POA: Insufficient documentation

## 2014-03-20 DIAGNOSIS — F332 Major depressive disorder, recurrent severe without psychotic features: Secondary | ICD-10-CM

## 2014-03-20 DIAGNOSIS — F3289 Other specified depressive episodes: Secondary | ICD-10-CM | POA: Insufficient documentation

## 2014-03-20 DIAGNOSIS — Z3202 Encounter for pregnancy test, result negative: Secondary | ICD-10-CM | POA: Insufficient documentation

## 2014-03-20 DIAGNOSIS — T50901A Poisoning by unspecified drugs, medicaments and biological substances, accidental (unintentional), initial encounter: Secondary | ICD-10-CM

## 2014-03-20 DIAGNOSIS — Z862 Personal history of diseases of the blood and blood-forming organs and certain disorders involving the immune mechanism: Secondary | ICD-10-CM | POA: Insufficient documentation

## 2014-03-20 LAB — COMPREHENSIVE METABOLIC PANEL
ALT: 11 U/L (ref 0–35)
AST: 19 U/L (ref 0–37)
Albumin: 4 g/dL (ref 3.5–5.2)
Alkaline Phosphatase: 72 U/L (ref 39–117)
BUN: 12 mg/dL (ref 6–23)
CO2: 19 mEq/L (ref 19–32)
CREATININE: 0.62 mg/dL (ref 0.50–1.10)
Calcium: 9 mg/dL (ref 8.4–10.5)
Chloride: 108 mEq/L (ref 96–112)
GFR calc non Af Amer: >90 mL/min (ref >90)
GLUCOSE: 94 mg/dL (ref 70–99)
Potassium: 3.7 mEq/L (ref 3.7–5.3)
SODIUM: 141 meq/L (ref 137–147)
TOTAL PROTEIN: 7.3 g/dL (ref 6.0–8.3)
Total Bilirubin: 0.3 mg/dL (ref 0.3–1.2)

## 2014-03-20 LAB — RAPID URINE DRUG SCREEN, HOSP PERFORMED
Amphetamines: NOT DETECTED
BARBITURATES: NOT DETECTED
Benzodiazepines: NOT DETECTED
Cocaine: NOT DETECTED
Opiates: NOT DETECTED
Tetrahydrocannabinol: NOT DETECTED

## 2014-03-20 LAB — URINALYSIS, ROUTINE W REFLEX MICROSCOPIC
BILIRUBIN URINE: NEGATIVE
Glucose, UA: NEGATIVE mg/dL
Hgb urine dipstick: NEGATIVE
KETONES UR: NEGATIVE mg/dL
Leukocytes, UA: NEGATIVE
NITRITE: NEGATIVE
PROTEIN: NEGATIVE mg/dL
Specific Gravity, Urine: 1.017 (ref 1.005–1.030)
Urobilinogen, UA: 1 mg/dL (ref 0.0–1.0)
pH: 8 (ref 5.0–8.0)

## 2014-03-20 LAB — ACETAMINOPHEN LEVEL

## 2014-03-20 LAB — CBC WITH DIFFERENTIAL/PLATELET
Basophils Absolute: 0 10*3/uL (ref 0.0–0.1)
Basophils Relative: 1 % (ref 0–1)
EOS ABS: 0.1 10*3/uL (ref 0.0–0.7)
Eosinophils Relative: 1 % (ref 0–5)
HCT: 37.7 % (ref 36.0–46.0)
Hemoglobin: 11.4 g/dL — ABNORMAL LOW (ref 12.0–15.0)
Lymphocytes Relative: 26 % (ref 12–46)
Lymphs Abs: 1.8 10*3/uL (ref 0.7–4.0)
MCH: 22.7 pg — ABNORMAL LOW (ref 26.0–34.0)
MCHC: 30.2 g/dL (ref 30.0–36.0)
MCV: 75 fL — ABNORMAL LOW (ref 78.0–100.0)
MONOS PCT: 5 % (ref 3–12)
Monocytes Absolute: 0.4 10*3/uL (ref 0.1–1.0)
Neutro Abs: 4.8 10*3/uL (ref 1.7–7.7)
Neutrophils Relative %: 67 % (ref 43–77)
PLATELETS: 216 10*3/uL (ref 150–400)
RBC: 5.03 MIL/uL (ref 3.87–5.11)
RDW: 15.9 % — ABNORMAL HIGH (ref 11.5–15.5)
WBC: 7.1 10*3/uL (ref 4.0–10.5)

## 2014-03-20 LAB — POC URINE PREG, ED: PREG TEST UR: NEGATIVE

## 2014-03-20 LAB — ETHANOL

## 2014-03-20 LAB — SALICYLATE LEVEL

## 2014-03-20 MED ORDER — PANTOPRAZOLE SODIUM 40 MG PO TBEC
40.0000 mg | DELAYED_RELEASE_TABLET | Freq: Every day | ORAL | Status: DC
  Administered 2014-03-20 – 2014-03-21 (×2): 40 mg via ORAL
  Filled 2014-03-20 (×2): qty 1

## 2014-03-20 MED ORDER — SODIUM CHLORIDE 0.9 % IV BOLUS (SEPSIS)
1000.0000 mL | INTRAVENOUS | Status: AC
  Administered 2014-03-20: 1000 mL via INTRAVENOUS

## 2014-03-20 MED ORDER — ONDANSETRON HCL 4 MG/2ML IJ SOLN
4.0000 mg | Freq: Once | INTRAMUSCULAR | Status: AC
  Administered 2014-03-20: 4 mg via INTRAVENOUS
  Filled 2014-03-20: qty 2

## 2014-03-20 MED ORDER — ALBUTEROL SULFATE HFA 108 (90 BASE) MCG/ACT IN AERS
2.0000 | INHALATION_SPRAY | Freq: Four times a day (QID) | RESPIRATORY_TRACT | Status: DC | PRN
  Filled 2014-03-20: qty 6.7

## 2014-03-20 MED ORDER — BUDESONIDE-FORMOTEROL FUMARATE 160-4.5 MCG/ACT IN AERO
2.0000 | INHALATION_SPRAY | Freq: Two times a day (BID) | RESPIRATORY_TRACT | Status: DC
  Administered 2014-03-20 – 2014-03-21 (×2): 2 via RESPIRATORY_TRACT
  Filled 2014-03-20: qty 6

## 2014-03-20 MED ORDER — TIOTROPIUM BROMIDE MONOHYDRATE 18 MCG IN CAPS
18.0000 ug | ORAL_CAPSULE | Freq: Every day | RESPIRATORY_TRACT | Status: DC
  Administered 2014-03-21: 18 ug via RESPIRATORY_TRACT
  Filled 2014-03-20: qty 5

## 2014-03-20 NOTE — ED Notes (Signed)
Pt presented by EMS and PD, report of ingestion 20-40 Topamax(50mg ) at noon, SI, 2nd attempt in 2 weeks, c/o nausea, dizziness and abd pain. Pt in NAD.

## 2014-03-20 NOTE — ED Notes (Signed)
MD at bedside. 

## 2014-03-20 NOTE — ED Provider Notes (Signed)
CSN: [id number]     Arrival date & time 03/20/14  2021 History   First MD Initiated Contact with Patient 03/20/14 2036     Chief Complaint  Patient presents with  . Ingestion  . Drug Overdose  . Suicide Attempt     (Consider location/radiation/quality/duration/timing/severity/associated sxs/prior Treatment) Patient is a 43 y.o. female presenting with Ingested Medication and Overdose. The history is provided by the patient.  Ingestion This is a new problem. The current episode started 6 to 12 hours ago. Episode frequency: once. The problem has not changed since onset.Pertinent negatives include no chest pain, no abdominal pain, no headaches and no shortness of breath. Nothing aggravates the symptoms. Nothing relieves the symptoms. She has tried nothing for the symptoms. The treatment provided no relief.  Drug Overdose This is a recurrent problem. The current episode started 6 to 12 hours ago. Episode frequency: once. The problem has not changed since onset.Pertinent negatives include no chest pain, no abdominal pain, no headaches and no shortness of breath. Nothing aggravates the symptoms. Nothing relieves the symptoms. She has tried nothing for the symptoms. The treatment provided no relief.    Past Medical History  Diagnosis Date  . Depression   . COPD (chronic obstructive pulmonary disease)   . Emphysema   . GERD (gastroesophageal reflux disease)   . Anemia   . Tobacco abuse    Past Surgical History  Procedure Laterality Date  . C section x 3     . Cholecystectomy    . Gastric bypass    . Abdominal hysterectomy     Family History  Problem Relation Age of Onset  . Diabetes Mother   . Diabetes Other   . Hypertension Other   . CAD Other    History  Substance Use Topics  . Smoking status: Current Every Day Smoker -- 2.00 packs/day    Types: Cigarettes  . Smokeless tobacco: Not on file  . Alcohol Use: Yes     Comment: occ   OB History   Grav Para Term Preterm Abortions  TAB SAB Ect Mult Living                 Review of Systems  Constitutional: Negative for fever and fatigue.  HENT: Negative for congestion and drooling.   Eyes: Negative for pain.  Respiratory: Negative for cough and shortness of breath.   Cardiovascular: Negative for chest pain.  Gastrointestinal: Negative for nausea, vomiting, abdominal pain and diarrhea.  Genitourinary: Negative for dysuria and hematuria.  Musculoskeletal: Negative for back pain, gait problem and neck pain.  Skin: Negative for color change.  Neurological: Negative for dizziness and headaches.  Hematological: Negative for adenopathy.  Psychiatric/Behavioral: Negative for behavioral problems.  All other systems reviewed and are negative.     Allergies  Review of patient's allergies indicates no known allergies.  Home Medications   Prior to Admission medications   Medication Sig Start Date End Date Taking? Authorizing Provider  albuterol (PROAIR HFA) 108 (90 BASE) MCG/ACT inhaler Inhale 2 puffs into the lungs every 6 (six) hours as needed for wheezing or shortness of breath.    Historical Provider, MD  budesonide-formoterol (SYMBICORT) 160-4.5 MCG/ACT inhaler Inhale 2 puffs into the lungs 2 (two) times daily.    Historical Provider, MD  esomeprazole (NEXIUM) 40 MG capsule Take 40 mg by mouth daily at 12 noon.    Historical Provider, MD  omeprazole (PRILOSEC) 20 MG capsule Take 20 mg by mouth as needed.  Historical Provider, MD  tiotropium (SPIRIVA) 18 MCG inhalation capsule Place 18 mcg into inhaler and inhale daily.    Historical Provider, MD   BP 117/60  Pulse 83  Temp(Src) 98.9 F (37.2 C) (Oral)  Resp 16  Ht 5\' 8"  (1.727 m)  Wt 250 lb (113.399 kg)  BMI 38.02 kg/m2  SpO2 96% Physical Exam  Nursing note and vitals reviewed. Constitutional: She is oriented to person, place, and time. She appears well-developed and well-nourished.  HENT:  Head: Normocephalic and atraumatic.  Mouth/Throat: Oropharynx  is clear and moist. No oropharyngeal exudate.  Eyes: Conjunctivae and EOM are normal. Pupils are equal, round, and reactive to light.  Neck: Normal range of motion. Neck supple.  Cardiovascular: Normal rate, regular rhythm, normal heart sounds and intact distal pulses.  Exam reveals no gallop and no friction rub.   No murmur heard. Pulmonary/Chest: Effort normal and breath sounds normal. No respiratory distress. She has no wheezes.  Abdominal: Soft. Bowel sounds are normal. There is no tenderness. There is no rebound and no guarding.  Musculoskeletal: Normal range of motion. She exhibits no edema and no tenderness.  Neurological: She is alert and oriented to person, place, and time.  Skin: Skin is warm and dry.  Psychiatric: Her speech is normal and behavior is normal. Judgment normal. Cognition and memory are normal. She exhibits a depressed mood. She expresses suicidal ideation.    ED Course  Procedures (including critical care time) Labs Review Labs Reviewed  CBC WITH DIFFERENTIAL - Abnormal; Notable for the following:    Hemoglobin 11.4 (*)    MCV 75.0 (*)    MCH 22.7 (*)    RDW 15.9 (*)    All other components within normal limits  SALICYLATE LEVEL - Abnormal; Notable for the following:    Salicylate Lvl <7.0 (*)    All other components within normal limits  URINALYSIS, ROUTINE W REFLEX MICROSCOPIC - Abnormal; Notable for the following:    APPearance CLOUDY (*)    All other components within normal limits  COMPREHENSIVE METABOLIC PANEL  ACETAMINOPHEN LEVEL  ETHANOL  URINE RAPID DRUG SCREEN (HOSP PERFORMED)  POC URINE PREG, ED    Imaging Review No results found.   EKG Interpretation   Date/Time:  Thursday March 20 2014 20:37:06 EDT Ventricular Rate:  57 PR Interval:  119 QRS Duration: 105 QT Interval:  417 QTC Calculation: 406 R Axis:   71 Text Interpretation:  Sinus rhythm Borderline short PR interval no  previous for comparison Confirmed by Patrecia Veiga  MD, Delray Reza  ([id number]) on  03/20/2014 8:40:03 PM      MDM   Final diagnoses:  Overdose  Suicide attempt    8:53 PM 43 y.o. female with a history of depression presents with overdose. She states that she took approximately 40 tablets of 50 mg Topamax today at noon. She denies any other coingestants such as Tylenol, other drugs, or alcohol. She states that she took 10 ibuprofen several weeks ago to try to hurt herself but did not present to the ER at that time. She states that she has had thoughts of suicide worsening over the last few weeks. She states that she has a mild global headache, nausea, and nonspecific diffuse abdominal pain since ingesting the Topamax. She denies any vomiting. She is afebrile and vital signs are unremarkable here. I discussed the case with poison control who recommends symptomatic therapy. They do not recommend getting a topiramate level.  11:14 PM Medically cleared, pt continues to  appear well. Consulted TTS. Pt willing to stay voluntarily.     Blanchard Kelch, MD 03/20/14 [phone]

## 2014-03-20 NOTE — ED Notes (Signed)
Bed: XU38 Expected date: 03/20/14 Expected time: 8:11 PM Means of arrival: Ambulance Comments: Drug overdose/SI

## 2014-03-21 ENCOUNTER — Inpatient Hospital Stay (HOSPITAL_COMMUNITY)
Admission: AD | Admit: 2014-03-21 | Discharge: 2014-03-25 | DRG: 885 | Disposition: A | Discharge status: Home / Self Care | Payer: Medicaid Other | Source: Intra-hospital | Attending: Psychiatry | Admitting: Psychiatry

## 2014-03-21 ENCOUNTER — Encounter (HOSPITAL_COMMUNITY): Payer: Self-pay | Admitting: Psychiatry

## 2014-03-21 ENCOUNTER — Encounter (HOSPITAL_COMMUNITY): Payer: Self-pay | Admitting: *Deleted

## 2014-03-21 DIAGNOSIS — R45851 Suicidal ideations: Secondary | ICD-10-CM

## 2014-03-21 DIAGNOSIS — Z23 Encounter for immunization: Secondary | ICD-10-CM

## 2014-03-21 DIAGNOSIS — Z8249 Family history of ischemic heart disease and other diseases of the circulatory system: Secondary | ICD-10-CM

## 2014-03-21 DIAGNOSIS — F17200 Nicotine dependence, unspecified, uncomplicated: Secondary | ICD-10-CM | POA: Diagnosis present

## 2014-03-21 DIAGNOSIS — K219 Gastro-esophageal reflux disease without esophagitis: Secondary | ICD-10-CM | POA: Diagnosis present

## 2014-03-21 DIAGNOSIS — F411 Generalized anxiety disorder: Secondary | ICD-10-CM | POA: Diagnosis present

## 2014-03-21 DIAGNOSIS — Z833 Family history of diabetes mellitus: Secondary | ICD-10-CM

## 2014-03-21 DIAGNOSIS — T1491XA Suicide attempt, initial encounter: Secondary | ICD-10-CM | POA: Diagnosis present

## 2014-03-21 DIAGNOSIS — Z598 Other problems related to housing and economic circumstances: Secondary | ICD-10-CM

## 2014-03-21 DIAGNOSIS — J45909 Unspecified asthma, uncomplicated: Secondary | ICD-10-CM | POA: Diagnosis present

## 2014-03-21 DIAGNOSIS — G4700 Insomnia, unspecified: Secondary | ICD-10-CM | POA: Diagnosis present

## 2014-03-21 DIAGNOSIS — Z5987 Material hardship due to limited financial resources, not elsewhere classified: Secondary | ICD-10-CM

## 2014-03-21 DIAGNOSIS — J438 Other emphysema: Secondary | ICD-10-CM | POA: Diagnosis present

## 2014-03-21 DIAGNOSIS — F329 Major depressive disorder, single episode, unspecified: Secondary | ICD-10-CM | POA: Diagnosis present

## 2014-03-21 DIAGNOSIS — F4310 Post-traumatic stress disorder, unspecified: Secondary | ICD-10-CM | POA: Diagnosis present

## 2014-03-21 DIAGNOSIS — F332 Major depressive disorder, recurrent severe without psychotic features: Secondary | ICD-10-CM

## 2014-03-21 DIAGNOSIS — F339 Major depressive disorder, recurrent, unspecified: Secondary | ICD-10-CM

## 2014-03-21 MED ORDER — HYDROXYZINE HCL 25 MG PO TABS
50.0000 mg | ORAL_TABLET | Freq: Once | ORAL | Status: AC
  Administered 2014-03-21: 50 mg via ORAL
  Filled 2014-03-21: qty 2

## 2014-03-21 MED ORDER — PANTOPRAZOLE SODIUM 40 MG PO TBEC
40.0000 mg | DELAYED_RELEASE_TABLET | Freq: Every day | ORAL | Status: DC
  Administered 2014-03-22 – 2014-03-25 (×4): 40 mg via ORAL
  Filled 2014-03-21 (×6): qty 1

## 2014-03-21 MED ORDER — ACETAMINOPHEN 325 MG PO TABS: 650.0000 mg | ORAL_TABLET | Freq: Four times a day (QID) | ORAL | Status: DC | PRN

## 2014-03-21 MED ORDER — PNEUMOCOCCAL VAC POLYVALENT 25 MCG/0.5ML IJ INJ
0.5000 mL | INJECTION | INTRAMUSCULAR | Status: AC
  Administered 2014-03-22: 0.5 mL via INTRAMUSCULAR

## 2014-03-21 MED ORDER — ALBUTEROL SULFATE HFA 108 (90 BASE) MCG/ACT IN AERS
2.0000 | INHALATION_SPRAY | Freq: Four times a day (QID) | RESPIRATORY_TRACT | Status: DC | PRN
  Administered 2014-03-22 – 2014-03-23 (×3): 2 via RESPIRATORY_TRACT
  Filled 2014-03-21: qty 6.7

## 2014-03-21 MED ORDER — TIOTROPIUM BROMIDE MONOHYDRATE 18 MCG IN CAPS
18.0000 ug | ORAL_CAPSULE | Freq: Every day | RESPIRATORY_TRACT | Status: DC
  Administered 2014-03-22 – 2014-03-25 (×4): 18 ug via RESPIRATORY_TRACT

## 2014-03-21 MED ORDER — BUDESONIDE-FORMOTEROL FUMARATE 160-4.5 MCG/ACT IN AERO
2.0000 | INHALATION_SPRAY | Freq: Two times a day (BID) | RESPIRATORY_TRACT | Status: DC
  Administered 2014-03-22 – 2014-03-25 (×6): 2 via RESPIRATORY_TRACT
  Filled 2014-03-21: qty 6

## 2014-03-21 MED ORDER — BUPROPION HCL ER (XL) 150 MG PO TB24
150.0000 mg | ORAL_TABLET | Freq: Every day | ORAL | Status: DC
  Administered 2014-03-21: 150 mg via ORAL
  Filled 2014-03-21: qty 1

## 2014-03-21 MED ORDER — FLUOXETINE HCL 10 MG PO CAPS
10.0000 mg | ORAL_CAPSULE | Freq: Every day | ORAL | Status: DC
  Filled 2014-03-21 (×3): qty 1

## 2014-03-21 MED ORDER — LURASIDONE HCL 40 MG PO TABS
40.0000 mg | ORAL_TABLET | Freq: Every day | ORAL | Status: DC
  Administered 2014-03-21: 40 mg via ORAL
  Filled 2014-03-21 (×2): qty 1

## 2014-03-21 MED ORDER — BUPROPION HCL ER (XL) 150 MG PO TB24
150.0000 mg | ORAL_TABLET | Freq: Every day | ORAL | Status: DC
  Administered 2014-03-22 – 2014-03-25 (×4): 150 mg via ORAL
  Filled 2014-03-21: qty 1
  Filled 2014-03-21: qty 3
  Filled 2014-03-21 (×4): qty 1

## 2014-03-21 MED ORDER — MAGNESIUM HYDROXIDE 400 MG/5ML PO SUSP: 30.0000 mL | Freq: Every day | ORAL | Status: DC | PRN

## 2014-03-21 MED ORDER — ALUM & MAG HYDROXIDE-SIMETH 200-200-20 MG/5ML PO SUSP: 30.0000 mL | ORAL | Status: DC | PRN

## 2014-03-21 MED ORDER — FLUOXETINE HCL 10 MG PO CAPS: 10.0000 mg | ORAL_CAPSULE | Freq: Every day | ORAL | Status: DC

## 2014-03-21 NOTE — BH Assessment (Signed)
Per Florencia Reasons, [facility], adult unit is at capacity. Contacted the following facilities for placement:  BED AVAILABLE. FAXED CLINICAL INFORMATION: [facility]: Per [facility]: Per [facility]: Per Jackelyn Poling  AT CAPACITY: [facility]: Per [facility]: Per Virl Cagey: Per [facility]: Per [place]: Per [facility]: Per [facility]: Per [facility]: Per [facility]: Per [facility]: Per [facility]: Per Andee Poles: Per Alvina Chou, [facility], [facility] Triage Specialist [phone]

## 2014-03-21 NOTE — Consult Note (Signed)
Barbara Stanley   Reason for Stanley:  Intentional overdose Referring Physician:  EDP  Barbara Stanley is an 43 y.o. female. Total Time spent with patient: 20 minutes  Assessment: AXIS I:  Major Depression, Recurrent severe AXIS II:  Deferred AXIS III:   Past Medical History  Diagnosis Date  . Depression   . COPD (chronic obstructive pulmonary disease)   . Emphysema   . GERD (gastroesophageal reflux disease)   . Anemia   . Tobacco abuse    AXIS IV:  economic problems, other psychosocial or environmental problems, problems related to social environment and problems with primary support group AXIS V:  21-30 behavior considerably influenced by delusions or hallucinations OR serious impairment in judgment, communication OR inability to function in almost all areas  Plan:  Recommend psychiatric Inpatient admission when medically cleared.  Dr. Parke Poisson assessed the patient and concurs with the plan.  Subjective:   Barbara Stanley is a 43 y.o. female patient admitted with depression, suicide attempt.  HPI:  Patient has been having financial stressors with her disability for depression and her boyfriend being out of work.  She has a 7 and 46 yo who live with her.  Her depression has been getting worse with the stress and almost overdosed on ibuprofen recently and last night took an overdose of Topamax.  She admits that "it was stupid" but obviously she is at her breaking point.  Admits to depression with suicidal ideations over the past two weeks.  She has not been taking any antidepressant medications for a long time.  Agreeable to come to inpatient for stabilization and medications. HPI Elements:   Location:  generalized. Quality:  acute. Severity:  severe. Timing:  constant. Duration:  two weeks plus. Context:  stressors.  Past Psychiatric History: Past Medical History  Diagnosis Date  . Depression   . COPD (chronic obstructive pulmonary disease)   . Emphysema   .  GERD (gastroesophageal reflux disease)   . Anemia   . Tobacco abuse     reports that she has been smoking Cigarettes.  She has been smoking about 2.00 packs per day. She does not have any smokeless tobacco history on file. She reports that she drinks alcohol. She reports that she does not use illicit drugs. Family History  Problem Relation Age of Onset  . Diabetes Mother   . Diabetes Other   . Hypertension Other   . CAD Other            Allergies:  No Known Allergies  ACT Assessment Complete:  Yes:    Educational Status    Risk to Self: Risk to self Is patient at risk for suicide?: Yes Substance abuse history and/or treatment for substance abuse?: No  Risk to Others:    Abuse:    Prior Inpatient Therapy:    Prior Outpatient Therapy:    Additional Information:                    Objective: Blood pressure 114/70, pulse 74, temperature 97.9 F (36.6 C), temperature source Oral, resp. rate 22, height '5\' 8"'  (1.727 m), weight 250 lb (113.399 kg), SpO2 97.00%.Body mass index is 38.02 kg/(m^2). Results for orders placed during the hospital encounter of 03/20/14 (from the past 72 hour(s))  CBC WITH DIFFERENTIAL     Status: Abnormal   Collection Time    03/20/14  9:00 PM      Result Value Ref Range   WBC 7.1  4.0 -  10.5 K/uL   RBC 5.03  3.87 - 5.11 MIL/uL   Hemoglobin 11.4 (*) 12.0 - 15.0 g/dL   HCT 37.7  36.0 - 46.0 %   MCV 75.0 (*) 78.0 - 100.0 fL   MCH 22.7 (*) 26.0 - 34.0 pg   MCHC 30.2  30.0 - 36.0 g/dL   RDW 15.9 (*) 11.5 - 15.5 %   Platelets 216  150 - 400 K/uL   Neutrophils Relative % 67  43 - 77 %   Neutro Abs 4.8  1.7 - 7.7 K/uL   Lymphocytes Relative 26  12 - 46 %   Lymphs Abs 1.8  0.7 - 4.0 K/uL   Monocytes Relative 5  3 - 12 %   Monocytes Absolute 0.4  0.1 - 1.0 K/uL   Eosinophils Relative 1  0 - 5 %   Eosinophils Absolute 0.1  0.0 - 0.7 K/uL   Basophils Relative 1  0 - 1 %   Basophils Absolute 0.0  0.0 - 0.1 K/uL  COMPREHENSIVE METABOLIC  PANEL     Status: None   Collection Time    03/20/14  9:00 PM      Result Value Ref Range   Sodium 141  137 - 147 mEq/L   Potassium 3.7  3.7 - 5.3 mEq/L   Chloride 108  96 - 112 mEq/L   CO2 19  19 - 32 mEq/L   Glucose, Bld 94  70 - 99 mg/dL   BUN 12  6 - 23 mg/dL   Creatinine, Ser 0.62  0.50 - 1.10 mg/dL   Calcium 9.0  8.4 - 10.5 mg/dL   Total Protein 7.3  6.0 - 8.3 g/dL   Albumin 4.0  3.5 - 5.2 g/dL   AST 19  0 - 37 U/L   ALT 11  0 - 35 U/L   Alkaline Phosphatase 72  39 - 117 U/L   Total Bilirubin 0.3  0.3 - 1.2 mg/dL   GFR calc non Af Amer >90  >90 mL/min   GFR calc Af Amer >90  >90 mL/min   Comment: (NOTE)     The eGFR has been calculated using the CKD EPI equation.     This calculation has not been validated in all clinical situations.     eGFR's persistently <90 mL/min signify possible Chronic Kidney     Disease.  ACETAMINOPHEN LEVEL     Status: None   Collection Time    03/20/14  9:00 PM      Result Value Ref Range   Acetaminophen (Tylenol), Serum <15.0  10 - 30 ug/mL   Comment:            THERAPEUTIC CONCENTRATIONS VARY     SIGNIFICANTLY. A RANGE OF 10-30     ug/mL MAY BE AN EFFECTIVE     CONCENTRATION FOR MANY PATIENTS.     HOWEVER, SOME ARE BEST TREATED     AT CONCENTRATIONS OUTSIDE THIS     RANGE.     ACETAMINOPHEN CONCENTRATIONS     >150 ug/mL AT 4 HOURS AFTER     INGESTION AND >50 ug/mL AT 12     HOURS AFTER INGESTION ARE     OFTEN ASSOCIATED WITH TOXIC     REACTIONS.  SALICYLATE LEVEL     Status: Abnormal   Collection Time    03/20/14  9:00 PM      Result Value Ref Range   Salicylate Lvl <0.9 (*) 2.8 - 20.0 mg/dL  ETHANOL  Status: None   Collection Time    03/20/14  9:00 PM      Result Value Ref Range   Alcohol, Ethyl (B) <11  0 - 11 mg/dL   Comment:            LOWEST DETECTABLE LIMIT FOR     SERUM ALCOHOL IS 11 mg/dL     FOR MEDICAL PURPOSES ONLY  URINALYSIS, ROUTINE W REFLEX MICROSCOPIC     Status: Abnormal   Collection Time    03/20/14   9:41 PM      Result Value Ref Range   Color, Urine YELLOW  YELLOW   APPearance CLOUDY (*) CLEAR   Specific Gravity, Urine 1.017  1.005 - 1.030   pH 8.0  5.0 - 8.0   Glucose, UA NEGATIVE  NEGATIVE mg/dL   Hgb urine dipstick NEGATIVE  NEGATIVE   Bilirubin Urine NEGATIVE  NEGATIVE   Ketones, ur NEGATIVE  NEGATIVE mg/dL   Protein, ur NEGATIVE  NEGATIVE mg/dL   Urobilinogen, UA 1.0  0.0 - 1.0 mg/dL   Nitrite NEGATIVE  NEGATIVE   Leukocytes, UA NEGATIVE  NEGATIVE   Comment: MICROSCOPIC NOT DONE ON URINES WITH NEGATIVE PROTEIN, BLOOD, LEUKOCYTES, NITRITE, OR GLUCOSE <1000 mg/dL.  URINE RAPID DRUG SCREEN (HOSP PERFORMED)     Status: None   Collection Time    03/20/14  9:41 PM      Result Value Ref Range   Opiates NONE DETECTED  NONE DETECTED   Cocaine NONE DETECTED  NONE DETECTED   Benzodiazepines NONE DETECTED  NONE DETECTED   Amphetamines NONE DETECTED  NONE DETECTED   Tetrahydrocannabinol NONE DETECTED  NONE DETECTED   Barbiturates NONE DETECTED  NONE DETECTED   Comment:            DRUG SCREEN FOR MEDICAL PURPOSES     ONLY.  IF CONFIRMATION IS NEEDED     FOR ANY PURPOSE, NOTIFY LAB     WITHIN 5 DAYS.                LOWEST DETECTABLE LIMITS     FOR URINE DRUG SCREEN     Drug Class       Cutoff (ng/mL)     Amphetamine      1000     Barbiturate      200     Benzodiazepine   008     Tricyclics       676     Opiates          300     Cocaine          300     THC              50  POC URINE PREG, ED     Status: None   Collection Time    03/20/14  9:49 PM      Result Value Ref Range   Preg Test, Ur NEGATIVE  NEGATIVE   Comment:            THE SENSITIVITY OF THIS     METHODOLOGY IS >24 mIU/mL   Labs are reviewed and are pertinent for no medical issues noted.  Current Facility-Administered Medications  Medication Dose Route Frequency Provider Last Rate Last Dose  . albuterol (PROVENTIL HFA;VENTOLIN HFA) 108 (90 BASE) MCG/ACT inhaler 2 puff  2 puff Inhalation Q6H PRN Blanchard Kelch, MD      . budesonide-formoterol (SYMBICORT) 160-4.5 MCG/ACT inhaler 2 puff  2  puff Inhalation BID Blanchard Kelch, MD   2 puff at 03/21/14 0820  . lurasidone (LATUDA) tablet 40 mg  40 mg Oral Q breakfast Lurena Nida, NP   40 mg at 03/21/14 0820  . pantoprazole (PROTONIX) EC tablet 40 mg  40 mg Oral Daily Blanchard Kelch, MD   40 mg at 03/21/14 0820  . tiotropium (SPIRIVA) inhalation capsule 18 mcg  18 mcg Inhalation Daily Blanchard Kelch, MD   18 mcg at 03/21/14 0820   Current Outpatient Prescriptions  Medication Sig Dispense Refill  . albuterol (PROAIR HFA) 108 (90 BASE) MCG/ACT inhaler Inhale 2 puffs into the lungs every 6 (six) hours as needed for wheezing or shortness of breath.      . budesonide-formoterol (SYMBICORT) 160-4.5 MCG/ACT inhaler Inhale 2 puffs into the lungs 2 (two) times daily.      [name] esomeprazole (NEXIUM) 40 MG capsule Take 40 mg by mouth daily at 12 noon.      . tiotropium (SPIRIVA) 18 MCG inhalation capsule Place 18 mcg into inhaler and inhale daily.        Psychiatric Specialty Exam:     Blood pressure 114/70, pulse 74, temperature 97.9 F (36.6 C), temperature source Oral, resp. rate 22, height '5\' 8"'  (1.727 m), weight 250 lb (113.399 kg), SpO2 97.00%.Body mass index is 38.02 kg/(m^2).  General Appearance: Disheveled  Eye Sport and exercise psychologist::  Fair  Speech:  Normal Rate  Volume:  Decreased  Mood:  Depressed  Affect:  Congruent  Thought Process:  Coherent  Orientation:  Full (Time, Place, and Person)  Thought Content:  Rumination  Suicidal Thoughts:  Yes.  with intent/plan  Homicidal Thoughts:  No  Memory:  Immediate;   Fair Recent;   Fair Remote;   Fair  Judgement:  Poor  Insight:  Fair  Psychomotor Activity:  Decreased  Concentration:  Fair  Recall:  [employer] of Knowledge:Fair  Language: Fair  Akathisia:  No  Handed:  Right  AIMS (if indicated):     Assets:  Communication Skills Desire for Improvement Housing Intimacy Leisure  Time Physical Health Resilience Social Support Transportation  Sleep:      Musculoskeletal: Strength & Muscle Tone: within normal limits Gait & Station: normal Patient leans: N/A  Treatment Plan Summary: Daily contact with patient to assess and evaluate symptoms and progress in treatment Medication management; inpatient hospitalization for stability.  Barbara Stanley, PMH-NP 03/21/2014 11:37 AM

## 2014-03-21 NOTE — Progress Notes (Signed)
Pt accepted to [facility] by [facility],  pt to be transported voluntarily by [employer]. Support paperwork completed.   [name] [phone]  ED CSW 03/21/2014 1427pm

## 2014-03-21 NOTE — Progress Notes (Signed)
Didn't attended group

## 2014-03-21 NOTE — ED Notes (Signed)
Patient drowsy. Denies SI, HI, AVH at present. Complains of stabbing pain in abd 7/10.   Encouragement offered. Vitals reassessed.   Patient safety maintained, Q 15 checks in place.
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
## 2014-03-21 NOTE — ED Notes (Signed)
Spoke with Raquel Sarna RN at [place] control with patient update.  No new reccomendations for care at this time.

## 2014-03-21 NOTE — Consult Note (Signed)
[facility] Consult   Reason for Consult:  Referral for psychiatric evaluation Referring Physician:  EDP Nile Dorning is an 43 y.o. female. Total Time spent with patient: 30 minutes  Assessment: AXIS I:  Major Depression, Recurrent severe AXIS II:  Deferred AXIS III:   Past Medical History  Diagnosis Date  . Depression   . COPD (chronic obstructive pulmonary disease)   . Emphysema   . GERD (gastroesophageal reflux disease)   . Anemia   . Tobacco abuse    AXIS IV:  economic problems, other psychosocial or environmental problems and problems with primary support group AXIS V:  11-20 some danger of hurting self or others possible OR occasionally fails to maintain minimal personal hygiene OR gross impairment in communication  Plan:  Recommend psychiatric Inpatient admission when medically cleared.  Subjective:   Zenda Herskowitz is a 43 y.o. female patient who presented to [facility] ED for evaluation of ingestion of medication and overdose. Patient states "I've taken some pills, approximately 40 Topamax(16m) around noon (03/20/14)."  Patient states "I didn't want to be here; I didn't want to be around any more."  Patient endorses that the Topamax ingestion was a suicide attempt; patient states this is her 2nd attempt in 2 weeks. Patient states she has had "multiple" previous attempts by overdose with the exception of once when she was found sitting on her window sill and she was taken to the hospital and admitted overnight. Patient states that prior to recent attempts, her last attempt was "many years ago." Patient reports her last hospitalization was 9 months ago at "[facility]," however, a review of EPIC does not show an admission for this patient, only a consult in 03/2013. Patient also states that she has had "alot of other hospitalizations" [place] She states "I was in the hospital more than I was out." Patient states she was diagnosed with  Bipolar disorder and was on Latuda which she felt was helpful.  Patient reports she has "been feeling really down for the past 3-4 days." She reports that she is not sleeping well and is only sleeping 2-3 hours at night. States she has difficulty falling asleep and staying asleep.  She states her appetite is "sometimes good and sometimes bad." Rates her current depression 15/10. Patient endorses current SI; will contract for safety in the hospital. Denis HI and AVH.   HPI:  43y.o. female patient who presented for evaluation of ingestion of medication and overdose. HPI Elements:   Location:  Mood. Quality:  Depressed. Severity:  Severe. Timing:  Daily. Duration:  Worsening over the past 3-4 days. Context:  Stressors; financial.  Past Psychiatric History: Past Medical History  Diagnosis Date  . Depression   . COPD (chronic obstructive pulmonary disease)   . Emphysema   . GERD (gastroesophageal reflux disease)   . Anemia   . Tobacco abuse     reports that she has been smoking Cigarettes.  She has been smoking about 2.00 packs per day. She does not have any smokeless tobacco history on file. She reports that she drinks alcohol. She reports that she does not use illicit drugs. Family History  Problem Relation Age of Onset  . Diabetes Mother   . Diabetes Other   . Hypertension Other   . CAD Other            Allergies:  No Known Allergies  ACT Assessment Complete:  No:   Past Psychiatric History: Yes Diagnosis:  MDD and Bipolar  Hospitalizations:  Yes, last hospitalized in [place]:  None at this time  Substance Abuse Care:  None  Self-Mutilation:  None  Suicidal Attempts:  Yes, reports multiple previous attempts  Homicidal Behaviors:  None   Violent Behaviors:  None   Place of Residence:  Lives with boyfriend in [place], [place] Marital Status:  Single Employed/Unemployed:  Unemployed Education:  High school grad, some Audiological scientist Family Supports:   Patient reports none Objective: Blood pressure 130/60, pulse 59, temperature 98.9 F (37.2 C), temperature source Oral, resp. rate 17, height '5\' 8"'  (1.727 m), weight 113.399 kg (250 lb), SpO2 95.00%.Body mass index is 38.02 kg/(m^2). Results for orders placed during the hospital encounter of 03/20/14 (from the past 72 hour(s))  CBC WITH DIFFERENTIAL     Status: Abnormal   Collection Time    03/20/14  9:00 PM      Result Value Ref Range   WBC 7.1  4.0 - 10.5 K/uL   RBC 5.03  3.87 - 5.11 MIL/uL   Hemoglobin 11.4 (*) 12.0 - 15.0 g/dL   HCT 37.7  36.0 - 46.0 %   MCV 75.0 (*) 78.0 - 100.0 fL   MCH 22.7 (*) 26.0 - 34.0 pg   MCHC 30.2  30.0 - 36.0 g/dL   RDW 15.9 (*) 11.5 - 15.5 %   Platelets 216  150 - 400 K/uL   Neutrophils Relative % 67  43 - 77 %   Neutro Abs 4.8  1.7 - 7.7 K/uL   Lymphocytes Relative 26  12 - 46 %   Lymphs Abs 1.8  0.7 - 4.0 K/uL   Monocytes Relative 5  3 - 12 %   Monocytes Absolute 0.4  0.1 - 1.0 K/uL   Eosinophils Relative 1  0 - 5 %   Eosinophils Absolute 0.1  0.0 - 0.7 K/uL   Basophils Relative 1  0 - 1 %   Basophils Absolute 0.0  0.0 - 0.1 K/uL  COMPREHENSIVE METABOLIC PANEL     Status: None   Collection Time    03/20/14  9:00 PM      Result Value Ref Range   Sodium 141  137 - 147 mEq/L   Potassium 3.7  3.7 - 5.3 mEq/L   Chloride 108  96 - 112 mEq/L   CO2 19  19 - 32 mEq/L   Glucose, Bld 94  70 - 99 mg/dL   BUN 12  6 - 23 mg/dL   Creatinine, Ser 0.62  0.50 - 1.10 mg/dL   Calcium 9.0  8.4 - 10.5 mg/dL   Total Protein 7.3  6.0 - 8.3 g/dL   Albumin 4.0  3.5 - 5.2 g/dL   AST 19  0 - 37 U/L   ALT 11  0 - 35 U/L   Alkaline Phosphatase 72  39 - 117 U/L   Total Bilirubin 0.3  0.3 - 1.2 mg/dL   GFR calc non Af Amer >90  >90 mL/min   GFR calc Af Amer >90  >90 mL/min   Comment: (NOTE)     The eGFR has been calculated using the CKD EPI equation.     This calculation has not been validated in all clinical situations.     eGFR's persistently <90 mL/min signify  possible Chronic Kidney     Disease.  ACETAMINOPHEN LEVEL     Status: None   Collection Time    03/20/14  9:00 PM  Result Value Ref Range   Acetaminophen (Tylenol), Serum <15.0  10 - 30 ug/mL   Comment:            THERAPEUTIC CONCENTRATIONS VARY     SIGNIFICANTLY. A RANGE OF 10-30     ug/mL MAY BE AN EFFECTIVE     CONCENTRATION FOR MANY PATIENTS.     HOWEVER, SOME ARE BEST TREATED     AT CONCENTRATIONS OUTSIDE THIS     RANGE.     ACETAMINOPHEN CONCENTRATIONS     >150 ug/mL AT 4 HOURS AFTER     INGESTION AND >50 ug/mL AT 12     HOURS AFTER INGESTION ARE     OFTEN ASSOCIATED WITH TOXIC     REACTIONS.  SALICYLATE LEVEL     Status: Abnormal   Collection Time    03/20/14  9:00 PM      Result Value Ref Range   Salicylate Lvl <3.5 (*) 2.8 - 20.0 mg/dL  ETHANOL     Status: None   Collection Time    03/20/14  9:00 PM      Result Value Ref Range   Alcohol, Ethyl (B) <11  0 - 11 mg/dL   Comment:            LOWEST DETECTABLE LIMIT FOR     SERUM ALCOHOL IS 11 mg/dL     FOR MEDICAL PURPOSES ONLY  URINALYSIS, ROUTINE W REFLEX MICROSCOPIC     Status: Abnormal   Collection Time    03/20/14  9:41 PM      Result Value Ref Range   Color, Urine YELLOW  YELLOW   APPearance CLOUDY (*) CLEAR   Specific Gravity, Urine 1.017  1.005 - 1.030   pH 8.0  5.0 - 8.0   Glucose, UA NEGATIVE  NEGATIVE mg/dL   Hgb urine dipstick NEGATIVE  NEGATIVE   Bilirubin Urine NEGATIVE  NEGATIVE   Ketones, ur NEGATIVE  NEGATIVE mg/dL   Protein, ur NEGATIVE  NEGATIVE mg/dL   Urobilinogen, UA 1.0  0.0 - 1.0 mg/dL   Nitrite NEGATIVE  NEGATIVE   Leukocytes, UA NEGATIVE  NEGATIVE   Comment: MICROSCOPIC NOT DONE ON URINES WITH NEGATIVE PROTEIN, BLOOD, LEUKOCYTES, NITRITE, OR GLUCOSE <1000 mg/dL.  URINE RAPID DRUG SCREEN (HOSP PERFORMED)     Status: None   Collection Time    03/20/14  9:41 PM      Result Value Ref Range   Opiates NONE DETECTED  NONE DETECTED   Cocaine NONE DETECTED  NONE DETECTED    Benzodiazepines NONE DETECTED  NONE DETECTED   Amphetamines NONE DETECTED  NONE DETECTED   Tetrahydrocannabinol NONE DETECTED  NONE DETECTED   Barbiturates NONE DETECTED  NONE DETECTED   Comment:            DRUG SCREEN FOR MEDICAL PURPOSES     ONLY.  IF CONFIRMATION IS NEEDED     FOR ANY PURPOSE, NOTIFY LAB     WITHIN 5 DAYS.                LOWEST DETECTABLE LIMITS     FOR URINE DRUG SCREEN     Drug Class       Cutoff (ng/mL)     Amphetamine      1000     Barbiturate      200     Benzodiazepine   456     Tricyclics       256     Opiates  300     Cocaine          300     THC              50  POC URINE PREG, ED     Status: None   Collection Time    03/20/14  9:49 PM      Result Value Ref Range   Preg Test, Ur NEGATIVE  NEGATIVE   Comment:            THE SENSITIVITY OF THIS     METHODOLOGY IS >24 mIU/mL   Labs are reviewed and are essentially unremarkable.  Current Facility-Administered Medications  Medication Dose Route Frequency Provider Last Rate Last Dose  . albuterol (PROVENTIL HFA;VENTOLIN HFA) 108 (90 BASE) MCG/ACT inhaler 2 puff  2 puff Inhalation Q6H PRN Blanchard Kelch, MD      . budesonide-formoterol [facility]) 160-4.5 MCG/ACT inhaler 2 puff  2 puff Inhalation BID Blanchard Kelch, MD   2 puff at 03/20/14 2334  . hydrOXYzine (ATARAX/VISTARIL) tablet 50 mg  50 mg Oral Once Lurena Nida, NP      . pantoprazole (PROTONIX) EC tablet 40 mg  40 mg Oral Daily Blanchard Kelch, MD   40 mg at 03/20/14 2334  . tiotropium (SPIRIVA) inhalation capsule 18 mcg  18 mcg Inhalation Daily Blanchard Kelch, MD       Current Outpatient Prescriptions  Medication Sig Dispense Refill  . albuterol (PROAIR HFA) 108 (90 BASE) MCG/ACT inhaler Inhale 2 puffs into the lungs every 6 (six) hours as needed for wheezing or shortness of breath.      . budesonide-formoterol (SYMBICORT) 160-4.5 MCG/ACT inhaler Inhale 2 puffs into the lungs 2 (two) times daily.      [name] esomeprazole  (NEXIUM) 40 MG capsule Take 40 mg by mouth daily at 12 noon.      . tiotropium (SPIRIVA) 18 MCG inhalation capsule Place 18 mcg into inhaler and inhale daily.        Psychiatric Specialty Exam:     Blood pressure 130/60, pulse 59, temperature 98.9 F (37.2 C), temperature source Oral, resp. rate 17, height '5\' 8"'  (1.727 m), weight 113.399 kg (250 lb), SpO2 95.00%.Body mass index is 38.02 kg/(m^2).  General Appearance: Fairly Groomed  Engineer, water::  Minimal  Speech:  Slow  Volume:  Decreased  Mood:  Depressed  Affect:  Flat  Thought Process:  Coherent  Orientation:  Full (Time, Place, and Person)  Thought Content:  WDL  Suicidal Thoughts:  Yes.  with intent/plan  Homicidal Thoughts:  No  Memory:  Immediate;   Good Recent;   Good Remote;   Fair  Judgement:  Poor  Insight:  Fair  Psychomotor Activity:  Normal  Concentration:  Fair  Recall:  [employer] of Knowledge:Fair  Language: Good  Akathisia:  No  Handed:  Right  AIMS (if indicated):     Assets:  Communication Skills Desire for Improvement  Sleep:      Musculoskeletal: Strength & Muscle Tone: within normal limits Gait & Station: Patient laying in bed Patient leans: N/A  Treatment Plan Summary: Daily contact with patient to assess and evaluate symptoms and progress in treatment Medication management  Disposition: Inpatient treatment is recommended.  No beds available at [facility] Will seek placement at alternate facilities. Will start Latuda 40 mg PO QD.  Serena Colonel, FNP-BC 03/21/2014 2:31 AM

## 2014-03-21 NOTE — ED Notes (Signed)
Pt provided with Suicide Prevention Plan work packet.

## 2014-03-22 DIAGNOSIS — F339 Major depressive disorder, recurrent, unspecified: Secondary | ICD-10-CM

## 2014-03-22 MED ORDER — TRAZODONE HCL 50 MG PO TABS
50.0000 mg | ORAL_TABLET | Freq: Every evening | ORAL | Status: DC | PRN
  Administered 2014-03-22 – 2014-03-24 (×3): 50 mg via ORAL
  Filled 2014-03-22: qty 3
  Filled 2014-03-22 (×2): qty 1

## 2014-03-22 NOTE — [facility] Group Notes (Signed)
[place] Group Notes:  (Clinical Social Work)  03/22/2014   3-4PM  Summary of Progress/Problems:   The main focus of today's process group was for the patient to identify ways in which they have sabotaged their own mental health wellness/recovery.  Motivational interviewing and a handout were used to explore the benefits and costs of their self-sabotaging behavior as well as the benefits and costs of changing this behavior.  The Stages of Change were explained to the group using a handout, and patients identified where they are with regard to changing self-defeating behaviors.  The patient expressed she self-sabotages with negative self-talk and being a people pleaser, but only with the significant someone she is involved with at any given time.  She talked about having a series of unfulfilling relationships, but said she is in a good relationship now and does not understand how things can be going so well and she can end up in the hospital.  Type of Therapy:  Process Group  Participation Level:  Active  Participation Quality:  Attentive and Sharing  Affect:  Blunted and Depressed  Cognitive:  Appropriate and Oriented  Insight:  Engaged  Engagement in Therapy:  Engaged  Modes of Intervention:  Education, Motivational Interviewing   [employer], LCSW 03/22/2014, 4:00pm

## 2014-03-22 NOTE — [facility] Suicide Risk Assessment (Signed)
Suicide Risk Assessment  Admission Assessment     Nursing information obtained from:    Demographic factors:   43 year old married female  Current Mental Status:   See below Loss Factors:   family stressors  Historical Factors:   history of depression Risk Reduction Factors:   Sense of commitment and responsibility to children Total Time spent with patient: 45 minutes  CLINICAL FACTORS:   Depression:   Anhedonia  Psychiatric Specialty Exam:     Blood pressure 116/74, pulse 91, temperature 97.3 F (36.3 C), temperature source Oral, resp. rate 20, height 5\' 7"  (1.702 m), weight 117.028 kg (258 lb).Body mass index is 40.4 kg/(m^2).  General Appearance: Fairly Groomed  Engineer, water::  Good  Speech:  Normal Rate  Volume:  Normal  Mood:  Depressed  Affect:  Labile  Thought Process:  Linear  Orientation:  Full (Time, Place, and Person)  Thought Content:  no psychotic symptoms  Suicidal Thoughts:  No- at THIS time denies any ongoing suicidal ideations and contracts for safety  Homicidal Thoughts:  No  Memory:  NA  Judgement:  Fair  Insight:  Fair  Psychomotor Activity:  Normal  Concentration:  Good  Recall:  Good  Fund of Knowledge:Good  Language: Good  Akathisia:  No  Handed:  Right  AIMS (if indicated):     Assets:  Desire for Improvement Resilience Social Support  Sleep:  Number of Hours: 6.5   Musculoskeletal: Strength & Muscle Tone: within normal limits Gait & Station: normal Patient leans: N/A  COGNITIVE FEATURES THAT CONTRIBUTE TO RISK:  Closed-mindedness    SUICIDE RISK:   Moderate:  Frequent suicidal ideation with limited intensity, and duration, some specificity in terms of plans, no associated intent, good self-control, limited dysphoria/symptomatology, some risk factors present, and identifiable protective factors, including available and accessible social support.  PLAN OF CARE:Patient admitted to inpatient unit- provide milieu/ support geared toward   encouraging and improving adaptive coping skills.  Provide  a safe, monitored environment and medication management as needed.  I certify that inpatient services furnished can reasonably be expected to improve the patient's condition.  Tikita Mabee 03/22/2014, 5:36 PM

## 2014-03-22 NOTE — [facility] Group Notes (Signed)
0900 nursing orientation group   The focus of this group is to educate the patient on the purpose and policies of crisis stabilization and provide a format to answer questions about their admission.  The group details unit policies and expectations of patients while admitted.   Pt was an active participant in group.  She was appropriate and sharing.  She denies any problems or complaints.

## 2014-03-22 NOTE — Progress Notes (Signed)
Adult Psychoeducational Group Note  Date:  03/22/2014 Time:  11:51 PM  Group Topic/Focus:  Wrap-Up Group:   The focus of this group is to help patients review their daily goal of treatment and discuss progress on daily workbooks.  Participation Level:  Active  Participation Quality:  Appropriate  Affect:  Appropriate  Cognitive:  Appropriate  Insight: Appropriate  Engagement in Group:  Engaged  Modes of Intervention:  Discussion  Additional Comments: The patient expressed that she had a better day than yesterday.  Thayer Dallas Ladajah Soltys 03/22/2014, 11:51 PM

## 2014-03-22 NOTE — Progress Notes (Signed)
D: Pt denies SI/HI/AVH. Pt is pleasant and cooperative. Pt forwards little, pt asked question then spent most of night in bed.  A: Pt was offered support and encouragement. Pt was given scheduled medications. Pt was encourage to attend groups. Q 15 minute checks were done for safety.   R:Pt receptive to treatment and safety maintained on unit.

## 2014-03-22 NOTE — Progress Notes (Signed)
D) Pt has been attending the groups and interacting with her peers appropriately. Affect is bright this evening and mood less depressed. Pt rates her depression and hopelessness both at a 4. Denies SI and HI. Affect is less intense today. A) Given support, reassurance and praise along with encouragement. Therapeutic humor used.  Provided with a 1:1.  R) Denies Si and HI.

## 2014-03-22 NOTE — Progress Notes (Signed)
Patient ID: Barbara Stanley, female   DOB: [birth year], 43 y.o.   MRN: [id number] D: pt. Visible on unit in dayroom watching TV, had a visit with family. Pt. Say of SA, "It was as stupid thing to do, a mistake", currently denies SHI. A: Writer introduced self to client, reviewed medication schedule, encouraged client to report concerns to staff. Staff will monitor q19min for safety, encouraged group. R: Pt. Is safe on the unit, attended group.

## 2014-03-22 NOTE — Progress Notes (Signed)
Psychoeducational Group Note  Date: 03/22/2014 Time:  1015  Group Topic/Focus:  Identifying Needs:   The focus of this group is to help patients identify their personal needs that have been historically problematic and identify healthy behaviors to address their needs.  Participation Level:  Active  Participation Quality:  Appropriate  Affect:  Appropriate  Cognitive:  Oriented  Insight:  Improving  Engagement in Group:  Engaged  Additional Comments:  Pt was attentive and participated in the group. Shared good ideas.  Barbara Stanley
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
## 2014-03-23 DIAGNOSIS — F329 Major depressive disorder, single episode, unspecified: Secondary | ICD-10-CM

## 2014-03-23 NOTE — Progress Notes (Signed)
[facility] MD Progress Note  03/23/2014 2:24 PM Barbara Stanley  MRN:  [id number] Subjective:   Evaluated caucasian female who was admitted for suicidal attempt by OD on her Topamax.  Patient stated she was very depressed, called up some loved ones for assistance but could not get help.  Patient feels remorseful and stated she will not attempt suicide again.  Patient today denied SI/HI/AVH and she reported that attending and participating in groups have assisted her in learning coping skills to use when she is depressed.  Patient reported fairly good sleep and good appetite.  Patient is compliant with her medications.  We will continue with our plan of care.  Diagnosis:   Major depressive d/o, single episode DSM5: Schizophrenia Disorders:  NA Obsessive-Compulsive Disorders:  NA Trauma-Stressor Disorders:  NA Substance/Addictive Disorders:  NA Depressive Disorders:  Major Depressive Disorder - Severe (296.23) Total Time spent with patient: 30 minutes  Axis I: Major Depression, single episode Axis II: Deferred Axis III:  Past Medical History  Diagnosis Date  . Depression   . COPD (chronic obstructive pulmonary disease)   . Emphysema   . GERD (gastroesophageal reflux disease)   . Anemia   . Tobacco abuse    Axis IV: other psychosocial or environmental problems, problems related to social environment and problems with primary support group Axis V: 41-50 serious symptoms  ADL's:  Intact  Sleep: Good  Appetite:  Good  Suicidal Ideation:  Plan:  NA Intent:  NA Means:  NA Homicidal Ideation:  Plan:  NA Intent:  NA Means:  NA AEB (as evidenced by):  Psychiatric Specialty Exam: Physical Exam  ROS  Blood pressure 115/78, pulse 94, temperature 97 F (36.1 C), temperature source Oral, resp. rate 18, height 5\' 7"  (1.702 m), weight 117.028 kg (258 lb), SpO2 96.00%.Body mass index is 40.4 kg/(m^2).  General Appearance: Casual and Fairly Groomed  Engineer, water::  Good  Speech:  Clear and  Coherent and Normal Rate  Volume:  Normal  Mood:  Depressed  Affect:  Depressed  Thought Process:  Coherent and Goal Directed  Orientation:  Full (Time, Place, and Person)  Thought Content:  WDL  Suicidal Thoughts:  No  Homicidal Thoughts:  No  Memory:  Immediate;   Good Recent;   Good Remote;   Good  Judgement:  Fair  Insight:  Good  Psychomotor Activity:  Normal  Concentration:  Good  Recall:  NA  Fund of Knowledge:Good  Language: Good  Akathisia:  NA  Handed:  Right  AIMS (if indicated):     Assets:  Desire for Improvement  Sleep:  Number of Hours: 5   Musculoskeletal: Strength & Muscle Tone: within normal limits Gait & Station: normal Patient leans: N/A  Current Medications: Current Facility-Administered Medications  Medication Dose Route Frequency Provider Last Rate Last Dose  . acetaminophen (TYLENOL) tablet 650 mg  650 mg Oral Q6H PRN Waylan Boga, NP      . albuterol (PROVENTIL HFA;VENTOLIN HFA) 108 (90 BASE) MCG/ACT inhaler 2 puff  2 puff Inhalation Q6H PRN Waylan Boga, NP   2 puff at 03/23/14 1255  . alum & mag hydroxide-simeth (MAALOX/MYLANTA) 200-200-20 MG/5ML suspension 30 mL  30 mL Oral Q4H PRN Waylan Boga, NP      . budesonide-formoterol (SYMBICORT) 160-4.5 MCG/ACT inhaler 2 puff  2 puff Inhalation BID Waylan Boga, NP   2 puff at 03/23/14 0726  . buPROPion (WELLBUTRIN XL) 24 hr tablet 150 mg  150 mg Oral Daily Waylan Boga, NP  150 mg at 03/23/14 0726  . magnesium hydroxide (MILK OF MAGNESIA) suspension 30 mL  30 mL Oral Daily PRN Waylan Boga, NP      . pantoprazole (PROTONIX) EC tablet 40 mg  40 mg Oral Daily Waylan Boga, NP   40 mg at 03/23/14 0726  . tiotropium (SPIRIVA) inhalation capsule 18 mcg  18 mcg Inhalation Daily Waylan Boga, NP   18 mcg at 03/23/14 0725  . traZODone (DESYREL) tablet 50 mg  50 mg Oral QHS PRN Nena Polio, PA-C   50 mg at 03/22/14 2125    Lab Results: No results found for this or any previous visit (from the past 48  hour(s)).  Physical Findings: AIMS: Facial and Oral Movements Muscles of Facial Expression: None, normal Lips and Perioral Area: None, normal Jaw: None, normal Tongue: None, normal,Extremity Movements Upper (arms, wrists, hands, fingers): None, normal Lower (legs, knees, ankles, toes): None, normal, Trunk Movements Neck, shoulders, hips: None, normal, Overall Severity Severity of abnormal movements (highest score from questions above): None, normal Incapacitation due to abnormal movements: None, normal Patient's awareness of abnormal movements (rate only patient's report): No Awareness, Dental Status Current problems with teeth and/or dentures?: No Does patient usually wear dentures?: No  CIWA:    COWS:     Treatment Plan Summary: Daily contact with patient to assess and evaluate symptoms and progress in treatment Medication management  Plan:  Plan: Continue with plan of care Continue crisis management Encourage to participate in group and individual sessions Continue medication management/ and review as needed Continue taking Bupropion 24 hr tablet by mouth daily for depression Trazodone 50 mg po every night as needed for sleep Discharge  Plan in progress Address health issues /V/S as needed    Medical Decision Making Problem Points:  Established problem, stable/improving (1) Data Points:  Review and summation of old records (2)  I certify that inpatient services furnished can reasonably be expected to improve the patient's condition.   Delfin Gant  PMHNP-BC  03/23/2014, 2:24 PM
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
## 2014-03-23 NOTE — Progress Notes (Signed)
Psychoeducational Group Note  Date: 03/23/2014 Time: 1015  Group Topic/Focus:  Making Healthy Choices:   The focus of this group is to help patients identify negative/unhealthy choices they were using prior to admission and identify positive/healthier coping strategies to replace them upon discharge.  Participation Level:  Active  Participation Quality:  Attentive  Affect:  Blunted  Cognitive:  Appropriate  Insight:  Engaged  Engagement in Group:  Engaged  Additional Comments:    03/23/2014,4:53 PM [place]

## 2014-03-23 NOTE — BHH Group Notes (Signed)
0900 nursing orientation group   The focus of this group is to educate the patient on the purpose and policies of crisis stabilization and provide a format to answer questions about their admission.  The group details unit policies and expectations of patients while admitted.  Pt was appropriate and sharing in group.  She stated,"I have my boyfriend and kids to help hold me up and it's okay to make a mistake as long as I learn from them"

## 2014-03-23 NOTE — Progress Notes (Signed)
D Pt has been attending the groups and interacting with her peers. Mood and affect are appropriate. Pt denies SI and HI and rates her depression and hopelessness both at a 3. Interacts with her peers in the milieu and attends the groups. Pt states that she is learning a lot and is happy with the new coping skills she has learned. A) Given support, reassurance and praise. Encouraged to talk in the groups and to be able to list her strengths. Provided with positive affirmations and therapeutic humor used. R) Denies SI and HI.

## 2014-03-23 NOTE — BHH Group Notes (Signed)
[facility] Group Notes:  (Clinical Social Work)  03/23/2014   1:15-2:15PM  Summary of Progress/Problems:  The main focus of today's process group was to   identify the patient's current support system and decide on other supports that can be put in place.  The picture on workbook was used to discuss why additional supports are needed.  An emphasis was placed on using counselor, doctor, therapy groups, 12-step groups, and problem-specific support groups to expand supports.   There was also an extensive discussion about what constitutes a healthy support versus an unhealthy support.  The patient expressed full comprehension of the concepts presented, and agreed that there is a need to add more supports.  The patient was very interactive in group and had several examples of unhealthy supports, was insightful.  Type of Therapy:  Process Group  Participation Level:  Active  Participation Quality:  Attentive and Sharing  Affect:  Blunted and Anxious and Depressed  Cognitive:  Appropriate and Oriented  Insight:  Engaged  Engagement in Therapy:  Engaged  Modes of Intervention:  Education,  Support and [employer], LCSW 03/23/2014, 4:00pm

## 2014-03-24 ENCOUNTER — Other Ambulatory Visit (HOSPITAL_COMMUNITY): Payer: Self-pay | Admitting: Physician Assistant

## 2014-03-24 MED ORDER — HYDROXYZINE HCL 50 MG PO TABS
50.0000 mg | ORAL_TABLET | Freq: Once | ORAL | Status: AC
  Administered 2014-03-24: 50 mg via ORAL
  Filled 2014-03-24 (×2): qty 1

## 2014-03-24 MED ORDER — HYDROXYZINE HCL 25 MG PO TABS: 25.0000 mg | ORAL_TABLET | Freq: Four times a day (QID) | ORAL | Status: DC | PRN

## 2014-03-24 MED ORDER — HYDROXYZINE HCL 50 MG PO TABS
50.0000 mg | ORAL_TABLET | Freq: Every evening | ORAL | Status: DC | PRN
  Administered 2014-03-24: 50 mg via ORAL
  Filled 2014-03-24: qty 1

## 2014-03-24 NOTE — Progress Notes (Signed)
D) Pt has been attending the groups and interacting with her peers. Affect is flat when not interacting. Mood is less depressed than yesterday. Pt rates her depression at a 2 and her hopelessness at a 1. States that she is starting to feel better and wanted to be discharged today. A) Pt given support, reassurance and praise. Encouragement given.  R) Pt denies SI and HI.

## 2014-03-24 NOTE — Progress Notes (Signed)
[facility] Assessment Progress Note Adult Psychoeducational Group Note  Date:  03/24/2014 Time:  10:16 PM  Group Topic/Focus:  Wrap-Up Group:   The focus of this group is to help patients review their daily goal of treatment and discuss progress on daily workbooks.  Participation Level:  Active  Participation Quality:  Appropriate and Attentive  Affect:  Appropriate  Cognitive:  Alert and Appropriate  Insight: Appropriate and Good  Engagement in Group:  Supportive  Modes of Intervention:  Activity and Support  Additional Comments:  Pt was engaged in group and had a couple of good points   Dhaval Woo R Marleah Beever 03/24/2014, 10:16 PM      ssessment Progress Note

## 2014-03-24 NOTE — Consult Note (Signed)
Patient seen with [name] and team. Patient increasingly depressed , overwhelmed and S/P suicide attempt by OD ing. At this time warrants inpatient psychiatric admission for safety/ crisis intervention. Agree withh Ms. Alinda Money Note, Assessment, Plan.

## 2014-03-24 NOTE — BHH Group Notes (Addendum)
[facility] LCSW Aftercare Discharge Planning Group Note   03/24/2014 9:33 AM    Participation Quality:  Appropraite  Mood/Affect:  Appropriate  Depression Rating:  1  Anxiety Rating:  3  Thoughts of Suicide:  No  Will you contract for safety?   NA  Current AVH:  No  Plan for Discharge/Comments:  Patient attended discharge planning group and actively participated in group.  She will need referral in [facility].  CSW provided all participants with daily workbook.   Transportation Means: Patient has transportation.   Supports:  Patient has a support system.   Gaby Harney, Eulas Post

## 2014-03-24 NOTE — Progress Notes (Signed)
[facility] MD Progress Note  03/24/2014 4:51 PM Barbara Stanley  MRN:  [id number] Subjective:  " I am feeling better"  Objective: I have discussed case with treatment team and I have met with patient. At this time patient is better. Less depressed, less anxious, and is currently future oriented, looking forward to discharge soon. She is focused on trying to get her son back , who is currently in the custody of her ex boyfirend's wife ( father of the child).  She is participative in milieu and not disruptive on the unit.  She denies medication side effects. Upon discharge she plans to follow up at [facility], and she has a PCP for medical management as needed As per staff, patient is improving, motivated .  Diagnosis:   AXIS I: MDD recurrent single episode  AXIS II: Deferred  AXIS III:  Past Medical History   Diagnosis  Date   .  Depression    .  COPD (chronic obstructive pulmonary disease)    .  Emphysema    .  GERD (gastroesophageal reflux disease)    .  Anemia    .  Tobacco abuse     AXIS IV: economic problems, occupational problems, problems related to social environment and problems with access to health care services  AXIS V: 50- 55    Total Time spent with patient: 20 minutes    ADL's:  Intact  Sleep: Good  Appetite:  Good  Suicidal Ideation:   denies SI currently Homicidal Ideation:   denies HI  AEB (as evidenced by):  Psychiatric Specialty Exam: Physical Exam  Review of Systems  Constitutional: Negative for fever and chills.  Respiratory: Negative for shortness of breath.   Cardiovascular: Negative for chest pain.  Gastrointestinal: Negative for vomiting.  Neurological: Negative for dizziness and tremors.  Psychiatric/Behavioral: Positive for depression. Negative for suicidal ideas.    Blood pressure 116/79, pulse 92, temperature 97.5 F (36.4 C), temperature source Oral, resp. rate 16, height '5\' 7"'  (1.702 m), weight 117.028 kg (258 lb), SpO2 96.00%.Body mass index is  40.4 kg/(m^2).  General Appearance: Well Groomed  Engineer, water::  Good  Speech:  Normal Rate  Volume:  Normal  Mood:  Depressed but improving  Affect:  Appropriate and reactive   Thought Process:  Goal Directed  Orientation:  Full (Time, Place, and Person)  Thought Content:  no psychotic symptoms  Suicidal Thoughts:  No  Homicidal Thoughts:  No  Memory:  NA  Judgement:  Good  Insight:  Fair  Psychomotor Activity:  Normal  Concentration:  Good  Recall:  Good  Fund of Knowledge:Good  Language: Good  Akathisia:  No  Handed:  Right  AIMS (if indicated):     Assets:  Communication Skills Desire for Improvement Resilience  Sleep:  Number of Hours: 5.25   Musculoskeletal: Strength & Muscle Tone: within normal limits Gait & Station: normal Patient leans: N/A  Current Medications: Current Facility-Administered Medications  Medication Dose Route Frequency Provider Last Rate Last Dose  . acetaminophen (TYLENOL) tablet 650 mg  650 mg Oral Q6H PRN Waylan Boga, NP      . albuterol (PROVENTIL HFA;VENTOLIN HFA) 108 (90 BASE) MCG/ACT inhaler 2 puff  2 puff Inhalation Q6H PRN Waylan Boga, NP   2 puff at 03/23/14 1255  . alum & mag hydroxide-simeth (MAALOX/MYLANTA) 200-200-20 MG/5ML suspension 30 mL  30 mL Oral Q4H PRN Waylan Boga, NP      . budesonide-formoterol (SYMBICORT) 160-4.5 MCG/ACT inhaler 2 puff  2  puff Inhalation BID Waylan Boga, NP   2 puff at 03/24/14 [phone]  . buPROPion (WELLBUTRIN XL) 24 hr tablet 150 mg  150 mg Oral Daily Waylan Boga, NP   150 mg at 03/24/14 0811  . magnesium hydroxide (MILK OF MAGNESIA) suspension 30 mL  30 mL Oral Daily PRN Waylan Boga, NP      . pantoprazole (PROTONIX) EC tablet 40 mg  40 mg Oral Daily Waylan Boga, NP   40 mg at 03/24/14 0811  . tiotropium (SPIRIVA) inhalation capsule 18 mcg  18 mcg Inhalation Daily Waylan Boga, NP   18 mcg at 03/24/14 0811  . traZODone (DESYREL) tablet 50 mg  50 mg Oral QHS PRN Nena Polio, PA-C   50 mg at 03/23/14  2110    Lab Results: No results found for this or any previous visit (from the past 48 hour(s)).  Physical Findings: AIMS: Facial and Oral Movements Muscles of Facial Expression: None, normal Lips and Perioral Area: None, normal Jaw: None, normal Tongue: None, normal,Extremity Movements Upper (arms, wrists, hands, fingers): None, normal Lower (legs, knees, ankles, toes): None, normal, Trunk Movements Neck, shoulders, hips: None, normal, Overall Severity Severity of abnormal movements (highest score from questions above): None, normal Incapacitation due to abnormal movements: None, normal Patient's awareness of abnormal movements (rate only patient's report): No Awareness, Dental Status Current problems with teeth and/or dentures?: No Does patient usually wear dentures?: No  CIWA:    COWS:     Assessment: patient is improving - mood and affect are now improved, her behavior is in good control, has no ongoing suicidal ideations, and is future oriented, she is tolerating WELLBUTRIN trial well , without side effects. We considered increasing it, but decided to continue current dose due to no side effects and clinical improvement .   Treatment Plan Summary: Daily contact with patient to assess and evaluate symptoms and progress in treatment Medication management continue Wellbutrin. Consider discharge soon as she continues to improve  Plan:As above.   Medical Decision Making Problem Points:  Established problem, stable/improving (1) Data Points:  Review of medication regiment & side effects (2)  I certify that inpatient services furnished can reasonably be expected to improve the patient's condition.   Fernando Cobos 03/24/2014, 4:51 PM

## 2014-03-24 NOTE — Progress Notes (Signed)
Adult Psychoeducational Group Note  Date:  03/24/2014 Time:  10:23 PM  Group Topic/Focus:  Wrap-Up Group:   The focus of this group is to help patients review their daily goal of treatment and discuss progress on daily workbooks.  Participation Level:  Active  Participation Quality:  Appropriate and Attentive  Affect:  Appropriate  Cognitive:  Alert and Appropriate  Insight: Appropriate and Good  Engagement in Group:  Supportive  Modes of Intervention:  Activity  Additional Comments:  PT was very engaged in the group and talked about her support group and what she felt needs to happens when she leaves [place].  Zhaire Locker R Jenese Mischke 03/24/2014, 10:23 PM

## 2014-03-24 NOTE — Progress Notes (Signed)
D: Pt denies SI/HI/AVH. Pt is pleasant and cooperative. Pt stated she was ready to leave. Her pastor will be picking her up in the morning. Pt wanted vistaril, pt stated it helped her to sleep.   A: Pt was offered support and encouragement. Pt was given scheduled medications. Pt was encourage to attend groups. Q 15 minute checks were done for safety.   R:Pt attends groups and interacts well with peers and staff. Pt is taking medication. Pt has no complaints at this time.Pt receptive to treatment and safety maintained on unit.

## 2014-03-24 NOTE — BHH Group Notes (Signed)
[facility] LCSW Group Therapy          Overcoming Obstacles       1:15 -2:30        03/24/2014       Type of Therapy:  Group Therapy  Participation Level:  Appropriate  Participation Quality:  Appropriate  Affect:  Appropriate, Alert  Cognitive:  Attentive Appropriate  Insight: Developing/Improving Engaged  Engagement in Therapy: Developing/Imprvoing Engaged  Modes of Intervention:  Discussion Exploration  Education Rapport BuildingProblem-Solving Support  Summary of Progress/Problems:  The main focus of today's group was overcoming obstacles.  Patient share she obstacle she has to overcome is trying to build a relationship with a new person before getting over the last person.       Patient able to identify appropriate coping skills.   Barbara Stanley 03/24/2014

## 2014-03-24 NOTE — Progress Notes (Signed)
  D: Pt observed sleeping in bed with eyes closed. RR even and unlabored. No distress noted  .  A: Q 15 minute checks were done for safety.  R: safety maintained on unit.  

## 2014-03-24 NOTE — Tx Team (Signed)
Interdisciplinary Treatment Plan Update   Date Reviewed:  03/24/2014  Time Reviewed:  8:33 AM  Progress in Treatment:   Attending groups: Yes Participating in groups: Yes Taking medication as prescribed: Yes  Tolerating medication: Yes Family/Significant other contact made:  No, but will ask patient for consent for collateral contact Patient understands diagnosis: Yes  Discussing patient identified problems/goals with staff: Yes Medical problems stabilized or resolved: Yes Denies suicidal/homicidal ideation: Yes Patient has not harmed self or others: Yes  For review of initial/current patient goals, please see plan of care.  Estimated Length of Stay:  2-3 days  Reasons for Continued Hospitalization:  Anxiety Depression Medication stabilization Suicidal ideation  New Problems/Goals identified:    Discharge Plan or Barriers:   Home with outpatient follow up to be determined  Additional Comments:   Barbara Stanley is a 43 year old female admitted from the ED after taking an overdose of "40 Topamax." The patient notes that she had been depressed for about 2 weeks prior to taking the pills. She is followed by Dr. Alford Highland at a local health clinic who is her PCP. She tried to speak with the LCSW about her symptoms prior to the OD, and was told she needed to make an appointment to be seen. She has a history of depression and previous suicide attempts ( "I can't tell you how many times from 43 years old to 43years old.) She states none since age 27. She is not currently on any psychiatric medication, and has not been for 20+ years. She states she has been stable for the past 20 years.  Attendees:  Patient:  03/24/2014 8:33 AM   Signature: Gabriel Earing, MD 03/24/2014 8:33 AM  Signature:   03/24/2014 8:33 AM  Signature:  Drake Leach, RN 03/24/2014 8:33 AM  Signature:Beverly Danelle Earthly, RN 03/24/2014 8:33 AM  Signature: Phillis Knack,  RN 03/24/2014 8:33 AM  Signature:  Joette Catching, LCSW 03/24/2014 8:33 AM  Signature:   Regan Lemming, LCSW 03/24/2014 8:33 AM  Signature:  Lucinda Dell, Care Coordinator [facility] 03/24/2014 8:33 AM  Signature:   03/24/2014 8:33 AM  Signature:  03/24/2014  8:33 AM  Signature:   Lars Pinks, RN [facility] 03/24/2014  8:33 AM  Signature: 03/24/2014  8:33 AM    Scribe for Treatment Team:   Joette Catching,  03/24/2014 8:33 AM

## 2014-03-25 MED ORDER — BUPROPION HCL ER (XL) 150 MG PO TB24: 150.0000 mg | ORAL_TABLET | Freq: Every day | ORAL | Status: DC

## 2014-03-25 MED ORDER — TRAZODONE HCL 50 MG PO TABS
50.0000 mg | ORAL_TABLET | Freq: Every evening | ORAL | Status: DC | PRN
Start: 2014-03-25 — End: 2014-06-04

## 2014-03-25 MED ORDER — ESOMEPRAZOLE MAGNESIUM 40 MG PO CPDR: 40.0000 mg | DELAYED_RELEASE_CAPSULE | Freq: Every day | ORAL | Status: DC

## 2014-03-25 MED ORDER — TIOTROPIUM BROMIDE MONOHYDRATE 18 MCG IN CAPS: 18.0000 ug | ORAL_CAPSULE | Freq: Every day | RESPIRATORY_TRACT | Status: DC

## 2014-03-25 MED ORDER — HYDROXYZINE HCL 25 MG PO TABS: 25.0000 mg | ORAL_TABLET | Freq: Four times a day (QID) | ORAL | Status: DC | PRN

## 2014-03-25 MED ORDER — BUDESONIDE-FORMOTEROL FUMARATE 160-4.5 MCG/ACT IN AERO: 2.0000 | INHALATION_SPRAY | Freq: Two times a day (BID) | RESPIRATORY_TRACT | Status: DC

## 2014-03-25 NOTE — Consult Note (Signed)
Face to face evaluation and I agree with this note 

## 2014-03-25 NOTE — BHH Suicide Risk Assessment (Signed)
[facility] INPATIENT:  Family/Significant Other Suicide Prevention Education  Suicide Prevention Education:  Education Completed; [name], Boyfriend, [phone];  has been identified by the patient as the family member/significant other with whom the patient will be residing, and identified as the person(s) who will aid the patient in the event of a mental health crisis (suicidal ideations/suicide attempt).  With written consent from the patient, the family member/significant other has been provided the following suicide prevention education, prior to the and/or following the discharge of the patient.  The suicide prevention education provided includes the following:  Suicide risk factors  Suicide prevention and interventions  National Suicide Hotline telephone number  [facility] assessment telephone number  [facility] Emergency Assistance [place] and/or Residential Mobile Crisis Unit telephone number  Request made of family/significant other to:  Remove weapons (e.g., guns, rifles, knives), all items previously/currently identified as safety concern.  Boyfriend advised patient does not have access to weapons.    Remove drugs/medications (over-the-counter, prescriptions, illicit drugs), all items previously/currently identified as a safety concern.  The family member/significant other verbalizes understanding of the suicide prevention education information provided.  The family member/significant other agrees to remove the items of safety concern listed above.  Barbara Stanley 03/25/2014, 9:07 AM

## 2014-03-25 NOTE — Progress Notes (Signed)
[facility] Adult Case Management Discharge Plan :  Will you be returning to the same living situation after discharge: Yes,  Patient will return to her home At discharge, do you have transportation home?:Yes,  Patient to arrange transportation home Do you have the ability to pay for your medications:Yes,  Patient has Medicaid.  Release of information consent forms completed and in the chart;  Patient's signature needed at discharge.  Patient to Follow up at: Follow-up Information   Follow up with [facility] On 03/28/2014. (Please go to [facility] on Friday, March 28, 2014 or any weekday between [place] for medication management)    Contact information:   [address], [place]   [address]  [phone]      Follow up On 03/27/2014. Ivin Booty Burkitt at [facility] on Thursday, March 27, 2014)    Contact information:   [address], [place]  [address]  [phone]      Patient denies SI/HI:  Patient no longer endorsing SI/HI or other thoughts of self harm.   Safety Planning and Suicide Prevention discussed:  .Reviewed with all patients during discharge planning group   [place] 03/25/2014, 10:30 AM

## 2014-03-25 NOTE — [facility] Suicide Risk Assessment (Signed)
   Demographic Factors:  43 year old female , living with boyfriend, currently unemployed   Total Time spent with patient: 30 minutes  Psychiatric Specialty Exam: Physical Exam  ROS  Blood pressure 101/67, pulse 91, temperature 98.4 F (36.9 C), temperature source Oral, resp. rate 18, height 5\' 7"  (1.702 m), weight 117.028 kg (258 lb), SpO2 96.00%.Body mass index is 40.4 kg/(m^2).  General Appearance: Well Groomed  Engineer, water::  Good  Speech:  Clear and Coherent  Volume:  Normal  Mood:  Euthymic  Affect:  Full Range  Thought Process:  Goal Directed and Linear  Orientation:  Full (Time, Place, and Person)  Thought Content:  no psychotic symptoms  Suicidal Thoughts:  No  Homicidal Thoughts:  No  Memory:  NA  Judgement:  Good  Insight:  Fair  Psychomotor Activity:  Normal  Concentration:  Good  Recall:  Good  Fund of Knowledge:Good  Language: Good  Akathisia:  No  Handed:  Right  AIMS (if indicated):     Assets:  Communication Skills Desire for Improvement Social Support  Sleep:  Number of Hours: 5.25    Musculoskeletal: Strength & Muscle Tone: within normal limits Gait & Station: normal Patient leans: N/A   Mental Status Per Nursing Assessment::   On Admission:     Current Mental Status by Physician: At this time patient calm, pleasant, well related,  not suicidal or homicidal or psychotic, future oriented  Loss Factors: Financial problems/change in socioeconomic status  Historical Factors: Impulsivity  Risk Reduction Factors:   Responsible for children under 49 years of age, Sense of responsibility to family and Positive social support  Continued Clinical Symptoms:  Depression:   Impulsivity  Cognitive Features That Contribute To Risk:  No gross cognitive deficits or issues noted at this time   Suicide Risk:  Mild:  Suicidal ideation of limited frequency, intensity, duration, and specificity.  There are no identifiable plans, no associated intent,  mild dysphoria and related symptoms, good self-control (both objective and subjective assessment), few other risk factors, and identifiable protective factors, including available and accessible social support.  Discharge Diagnoses:   AXIS I: MDD recurrent single episode  AXIS II: Deferred  AXIS III:  Past Medical History   Diagnosis  Date   .  Depression    .  COPD (chronic obstructive pulmonary disease)    .  Emphysema    .  GERD (gastroesophageal reflux disease)    .  Anemia    .  Tobacco abuse    AXIS IV: economic problems, occupational problems, problems related to social environment and problems with access to health care services  AXIS V: 60-65 upon discharge     Plan Of Care/Follow-up recommendations:  Activity:  as tolerated  Diet:  regular Tests:  NA Other:  Patient is returning home- she will follow up at [facility] for ongoing outpatient treatment She is tolerating Wellbutrin trial well- feels it is helping and currently denies side effects  Is patient on multiple antipsychotic therapies at discharge:  No   Has Patient had three or more failed trials of antipsychotic monotherapy by history:  No  Recommended Plan for Multiple Antipsychotic Therapies: NA    Fernando Cobos 03/25/2014, 2:08 PM

## 2014-03-25 NOTE — Progress Notes (Signed)
The focus of this group is to educate the patient on the purpose and policies of crisis stabilization and provide a format to answer questions about their admission.  The group details unit policies and expectations of patients while admitted. Patient attended this group. 

## 2014-03-25 NOTE — Progress Notes (Signed)
Patient ID: Barbara Stanley, female   DOB: [birth year], 43 y.o.   MRN: [id number] Patient currently asleep; no s/s of distress noted. Respirations regular and unlabored. No complaints at this time.

## 2014-03-28 NOTE — Progress Notes (Signed)
Patient Discharge Instructions:  After Visit Summary (AVS):   Faxed to:  03/28/14 Psychiatric Admission Assessment Note:   Faxed to:  03/28/14 Suicide Risk Assessment - Discharge Assessment:   Faxed to:  03/28/14 Faxed/Sent to the Next Level Care provider:  03/28/14 Faxed to [place] @ [phone] Faxed to [facility] @ [place], 03/28/2014, 2:44 PM

## 2014-04-13 NOTE — Discharge Summary (Signed)
Physician Discharge Summary Note  Patient:  Barbara Stanley is an 43 y.o., female MRN:  [id number] DOB:  1971-08-01 Patient phone:  [phone] (home)  Patient address:   [place] [address],  Total Time spent with patient: 30 minutes  Date of Admission:  03/21/2014 Date of Discharge: 03/25/2014  Reason for Admission:  MDD with SI with OD  Discharge Diagnoses: Active Problems:   MDD (major depressive disorder)   Psychiatric Specialty Exam: Physical Exam  Review of Systems  Constitutional: Negative.   HENT: Negative.   Eyes: Negative.   Respiratory: Negative.   Cardiovascular: Negative.   Gastrointestinal: Negative.   Genitourinary: Negative.   Musculoskeletal: Negative.   Skin: Negative.   Neurological: Negative.   Endo/Heme/Allergies: Negative.   Psychiatric/Behavioral: Positive for depression. The patient is nervous/anxious.     Blood pressure 101/67, pulse 91, temperature 98.4 F (36.9 C), temperature source Oral, resp. rate 18, height 5\' 7"  (1.702 m), weight 117.028 kg (258 lb), SpO2 96.00%.Body mass index is 40.4 kg/(m^2).   General Appearance: Well Groomed   Engineer, water:: Good   Speech: Clear and Coherent   Volume: Normal   Mood: Euthymic   Affect: Full Range   Thought Process: Goal Directed and Linear   Orientation: Full (Time, Place, and Person)   Thought Content: no psychotic symptoms   Suicidal Thoughts: No   Homicidal Thoughts: No   Memory: NA   Judgement: Good   Insight: Fair   Psychomotor Activity: Normal   Concentration: Good   Recall: Good   Fund of Knowledge:Good   Language: Good   Akathisia: No   Handed: Right   AIMS (if indicated):   Assets: Communication Skills  Desire for Improvement  Social Support   Sleep: Number of Hours: 5.25    Musculoskeletal:  Strength & Muscle Tone: within normal limits  Gait & Station: normal  Patient leans: N/A   DSM5:  Depressive Disorders:  Major Depressive Disorder - Severe  (296.23)  Axis Diagnosis:   AXIS I:  Major Depression, Recurrent severe AXIS II:  Deferred AXIS III:   Past Medical History  Diagnosis Date  . Depression   . COPD (chronic obstructive pulmonary disease)   . Emphysema   . GERD (gastroesophageal reflux disease)   . Anemia   . Tobacco abuse    AXIS IV:  other psychosocial or environmental problems and problems related to social environment AXIS V:  41-50 serious symptoms  Level of Care:  OP  Hospital Course:  [name] is a 43 year old female admitted from the ED after taking an overdose of "40 Topamax." The patient notes that she had been depressed for about 2 weeks prior to taking the pills. She is followed by Dr. Alford Highland at a local health clinic who is her PCP. She tried to speak with the LCSW about her symptoms prior to the OD, and was told she needed to make an appointment to be seen. She has a history of depression and previous suicide attempts ( "I can't tell you how many times from 43 years old to 43years old.) She states none since age 78. She is not currently on any psychiatric medication, and has not been for 20+ years. She states she has been stable for the past 20 years.  During Hospitalization: Medications managed, psychoeducation, group and individual therapy. Pt currently denies SI, HI, and Psychosis. At discharge, pt rates anxiety and depression as moderate. Pt states that she does have a good supportive home  environment and will followup with outpatient treatment at [facility]. Affirms agreement with medication regimen and discharge plan. Denies other physical and psychological concerns at time of discharge.   Consults:  None  Significant Diagnostic Studies:  None  Discharge Vitals:   Blood pressure 101/67, pulse 91, temperature 98.4 F (36.9 C), temperature source Oral, resp. rate 18, height 5\' 7"  (1.702 m), weight 117.028 kg (258 lb), SpO2 96.00%. Body mass index is 40.4 kg/(m^2). Lab Results:   No results found for  this or any previous visit (from the past 72 hour(s)).  Physical Findings: AIMS: Facial and Oral Movements Muscles of Facial Expression: None, normal Lips and Perioral Area: None, normal Jaw: None, normal Tongue: None, normal,Extremity Movements Upper (arms, wrists, hands, fingers): None, normal Lower (legs, knees, ankles, toes): None, normal, Trunk Movements Neck, shoulders, hips: None, normal, Overall Severity Severity of abnormal movements (highest score from questions above): None, normal Incapacitation due to abnormal movements: None, normal Patient's awareness of abnormal movements (rate only patient's report): No Awareness, Dental Status Current problems with teeth and/or dentures?: No Does patient usually wear dentures?: No  CIWA:    COWS:     Psychiatric Specialty Exam: See Psychiatric Specialty Exam and Suicide Risk Assessment completed by Attending Physician prior to discharge.  Discharge destination:  Home  Is patient on multiple antipsychotic therapies at discharge:  No   Has Patient had three or more failed trials of antipsychotic monotherapy by history:  No  Recommended Plan for Multiple Antipsychotic Therapies: NA     Medication List       Indication   budesonide-formoterol 160-4.5 MCG/ACT inhaler  Commonly known as:  SYMBICORT  Inhale 2 puffs into the lungs 2 (two) times daily.   Indication:  shortness of breath     buPROPion 150 MG 24 hr tablet  Commonly known as:  WELLBUTRIN XL  Take 1 tablet (150 mg total) by mouth daily.   Indication:  mood stabilization     esomeprazole 40 MG capsule  Commonly known as:  NEXIUM  Take 1 capsule (40 mg total) by mouth daily at 12 noon.   Indication:  GERD     hydrOXYzine 25 MG tablet  Commonly known as:  ATARAX/VISTARIL  Take 1 tablet (25 mg total) by mouth every 6 (six) hours as needed for anxiety.   Indication:  anxiety     PROAIR HFA 108 (90 BASE) MCG/ACT inhaler  Generic drug:  albuterol  Inhale 2 puffs  into the lungs every 6 (six) hours as needed for wheezing or shortness of breath.      tiotropium 18 MCG inhalation capsule  Commonly known as:  SPIRIVA  Place 1 capsule (18 mcg total) into inhaler and inhale daily.   Indication:  asthma     traZODone 50 MG tablet  Commonly known as:  DESYREL  Take 1 tablet (50 mg total) by mouth at bedtime as needed for sleep.   Indication:  Trouble Sleeping           Follow-up Information   Follow up with [facility] On 03/28/2014. (Please go to [facility] on Friday, March 28, 2014 or any weekday between [place] for medication management)    Contact information:   [address], [place]   [address]  [phone]      Follow up On 03/27/2014. Ivin Booty Burkitt at [facility] on Thursday, March 27, 2014)    Contact information:   [address] [place], [place]  [address]  [phone]  Follow-up recommendations:  Activity:  As tolerated Diet:  Heart healthy with low sodium.  Comments:   Take all medications as prescribed. Keep all follow-up appointments as scheduled.  Do not consume alcohol or use illegal drugs while on prescription medications. Report any adverse effects from your medications to your primary care provider promptly.  In the event of recurrent symptoms or worsening symptoms, call 911, a crisis hotline, or go to the nearest emergency department for evaluation.   Total Discharge Time:  Greater than 30 minutes.  Signed: Benjamine Mola, FNP-BC 03/25/2014, 4:31 PM

## 2014-06-02 ENCOUNTER — Emergency Department (INDEPENDENT_AMBULATORY_CARE_PROVIDER_SITE_OTHER)
Admission: EM | Admit: 2014-06-02 | Discharge: 2014-06-02 | Disposition: A | Discharge status: Home / Self Care | Payer: Medicaid Other | Source: Home / Self Care | Attending: Family Medicine | Admitting: Family Medicine

## 2014-06-02 ENCOUNTER — Encounter (HOSPITAL_COMMUNITY): Payer: Self-pay | Admitting: Emergency Medicine

## 2014-06-02 DIAGNOSIS — B002 Herpesviral gingivostomatitis and pharyngotonsillitis: Secondary | ICD-10-CM

## 2014-06-02 MED ORDER — VALACYCLOVIR HCL 1 G PO TABS: 2000.0000 mg | ORAL_TABLET | Freq: Once | ORAL | Status: DC

## 2014-06-02 MED ORDER — VALACYCLOVIR HCL 1 G PO TABS: 2000.0000 mg | ORAL_TABLET | Freq: Two times a day (BID) | ORAL | Status: DC

## 2014-06-02 MED ORDER — VALACYCLOVIR HCL 1 G PO TABS
2000.0000 mg | ORAL_TABLET | Freq: Two times a day (BID) | ORAL | Status: DC
Start: 2014-06-02 — End: 2014-06-02

## 2014-06-02 NOTE — Discharge Instructions (Signed)
Thank you for coming in today. Take valtrex once.  Keep your lips covered with Vaseline Come back as needed  Cold Sore A cold sore (fever blister) is a skin infection caused by the herpes simplex virus (HSV-1). HSV-1 is closely related to the virus that causes genital herpes (HSV-2), but they are not the same even though both viruses can cause oral and genital infections. Cold sores are small, fluid-filled sores inside of the mouth or on the lips, gums, nose, chin, cheeks, or fingers.  The herpes simplex virus can be easily passed (contagious) to other people through close personal contact, such as kissing or sharing personal items. The virus can also spread to other parts of the body, such as the eyes or genitals. Cold sores are contagious until the sores crust over completely. They often heal within 2 weeks.  Once a person is infected, the herpes simplex virus remains permanently in the body. Therefore, there is no cure for cold sores, and they often recur when a person is tired, stressed, sick, or gets too much sun. Additional factors that can cause a recurrence include hormone changes in menstruation or pregnancy, certain drugs, and cold weather.  CAUSES  Cold sores are caused by the herpes simplex virus. The virus is spread from person to person through close contact, such as through kissing, touching the affected area, or sharing personal items such as lip balm, razors, or eating utensils.  SYMPTOMS  The first infection may not cause symptoms. If symptoms develop, the symptoms often go through different stages. Here is how a cold sore develops:   Tingling, itching, or burning is felt 1-2 days before the outbreak.   Fluid-filled blisters appear on the lips, inside the mouth, nose, or on the cheeks.   The blisters start to ooze clear fluid.   The blisters dry up and a yellow crust appears in its place.   The crust falls off.  Symptoms depend on whether it is the initial outbreak or a  recurrence. Some other symptoms with the first outbreak may include:   Fever.   Sore throat.   Headache.   Muscle aches.   Swollen neck glands.  DIAGNOSIS  A diagnosis is often made based on your symptoms and looking at the sores. Sometimes, a sore may be swabbed and then examined in the lab to make a final diagnosis. If the sores are not present, blood tests can find the herpes simplex virus.  TREATMENT  There is no cure for cold sores and no vaccine for the herpes simplex virus. Within 2 weeks, most cold sores go away on their own without treatment. Medicines cannot make the infection go away, but medicine can help relieve some of the pain associated with the sores, can work to stop the virus from multiplying, and can also shorten healing time. Medicine may be in the form of creams, gels, pills, or a shot.  HOME CARE INSTRUCTIONS   Only take over-the-counter or prescription medicines for pain, discomfort, or fever as directed by your caregiver. Do not use aspirin.   Use a cotton-tip swab to apply creams or gels to your sores.   Do not touch the sores or pick the scabs. Wash your hands often. Do not touch your eyes without washing your hands first.   Avoid kissing, oral sex, and sharing personal items until sores heal.   Apply an ice pack on your sores for 10-15 minutes to ease any discomfort.   Avoid hot, cold, or salty foods  because they may hurt your mouth. Eat a soft, bland diet to avoid irritating the sores. Use a straw to drink if you have pain when drinking out of a glass.   Keep sores clean and dry to prevent an infection of other tissues.   Avoid the sun and limit stress if these things trigger outbreaks. If sun causes cold sores, apply sunscreen on the lips before being out in the sun.  SEEK MEDICAL CARE IF:   You have a fever or persistent symptoms for more than 2-3 days.   You have a fever and your symptoms suddenly get worse.   You have pus, not  clear fluid, coming from the sores.   You have redness that is spreading.   You have pain or irritation in your eye.   You get sores on your genitals.   Your sores do not heal within 2 weeks.   You have a weakened immune system.   You have frequent recurrences of cold sores.  MAKE SURE YOU:   Understand these instructions.  Will watch your condition.  Will get help right away if you are not doing well or get worse. Document Released: 09/30/2000 Document Revised: 02/17/2014 Document Reviewed: 02/15/2012 [facility] Patient Information 2015 [place], [place]. This information is not intended to replace advice given to you by your health care provider. Make sure you discuss any questions you have with your health care provider.

## 2014-06-02 NOTE — ED Provider Notes (Addendum)
Barbara Stanley is a 43 y.o. female who presents to Urgent Care today for lip blisters. Patient notes a three-day history of tingling and blistering to her lower lip. This is in the setting of a recent sunburn. Her symptoms are somewhat consistent with past episodes of cold sores however they are worse. She denies any fevers or chills nausea vomiting or diarrhea. She denies any new medications soaps shampoos cosmetics etc.   Past Medical History  Diagnosis Date  . Depression   . COPD (chronic obstructive pulmonary disease)   . Emphysema   . GERD (gastroesophageal reflux disease)   . Anemia   . Tobacco abuse    History  Substance Use Topics  . Smoking status: Current Every Day Smoker -- 2.00 packs/day    Types: Cigarettes  . Smokeless tobacco: Not on file  . Alcohol Use: Yes     Comment: occ   ROS as above Medications: No current facility-administered medications for this encounter.   Current Outpatient Prescriptions  Medication Sig Dispense Refill  . esomeprazole (NEXIUM) 40 MG capsule Take 1 capsule (40 mg total) by mouth daily at 12 noon.      [name] albuterol (PROAIR HFA) 108 (90 BASE) MCG/ACT inhaler Inhale 2 puffs into the lungs every 6 (six) hours as needed for wheezing or shortness of breath.      . budesonide-formoterol (SYMBICORT) 160-4.5 MCG/ACT inhaler Inhale 2 puffs into the lungs 2 (two) times daily.  1 Inhaler  12  . buPROPion (WELLBUTRIN XL) 150 MG 24 hr tablet Take 1 tablet (150 mg total) by mouth daily.  30 tablet  0  . hydrOXYzine (ATARAX/VISTARIL) 25 MG tablet Take 1 tablet (25 mg total) by mouth every 6 (six) hours as needed for anxiety.  30 tablet  0  . tiotropium (SPIRIVA) 18 MCG inhalation capsule Place 1 capsule (18 mcg total) into inhaler and inhale daily.  30 capsule  12  . traZODone (DESYREL) 50 MG tablet Take 1 tablet (50 mg total) by mouth at bedtime as needed for sleep.  14 tablet  0  . valACYclovir (VALTREX) 1000 MG tablet Take 2 tablets (2,000 mg total) by  mouth 2 (two) times daily.  4 tablet  0    Exam:  BP 115/80  Pulse 68  Temp(Src) 99.4 F (37.4 C) (Oral)  Resp 16  SpO2 97% Gen: Well NAD Lips: Blistering lower lip.  Mildly tender. No other oral lesions. Skin: No rash  No results found for this or any previous visit (from the past 24 hour(s)). No results found.  Assessment and Plan: 43 y.o. female with oral herpes. Plan to treat with Valtrex 2 g twice daily for one day. HSV culture pending E-prescription clarified over the phone with the pharmacist  Discussed warning signs or symptoms. Please see discharge instructions. Patient expresses understanding.   This note was created using Systems analyst. Any transcription errors are unintended.    Gregor Hams, MD 06/02/14 2919  Gregor Hams, MD 06/02/14 1228

## 2014-06-02 NOTE — ED Notes (Signed)
Pt reports blisters on lower lip onset yest; sunburn Pain is 5/10 Denies fevers Alert; no signs of acute distress.

## 2014-06-04 ENCOUNTER — Emergency Department (HOSPITAL_COMMUNITY)
Admission: EM | Admit: 2014-06-04 | Discharge: 2014-06-04 | Disposition: A | Discharge status: Home / Self Care | Payer: Medicaid Other | Attending: Emergency Medicine | Admitting: Emergency Medicine

## 2014-06-04 ENCOUNTER — Encounter (HOSPITAL_COMMUNITY): Payer: Self-pay | Admitting: Emergency Medicine

## 2014-06-04 DIAGNOSIS — J4489 Other specified chronic obstructive pulmonary disease: Secondary | ICD-10-CM | POA: Insufficient documentation

## 2014-06-04 DIAGNOSIS — F418 Other specified anxiety disorders: Secondary | ICD-10-CM | POA: Diagnosis present

## 2014-06-04 DIAGNOSIS — F419 Anxiety disorder, unspecified: Secondary | ICD-10-CM

## 2014-06-04 DIAGNOSIS — F411 Generalized anxiety disorder: Secondary | ICD-10-CM

## 2014-06-04 DIAGNOSIS — J449 Chronic obstructive pulmonary disease, unspecified: Secondary | ICD-10-CM | POA: Insufficient documentation

## 2014-06-04 DIAGNOSIS — F32A Depression, unspecified: Secondary | ICD-10-CM | POA: Diagnosis present

## 2014-06-04 DIAGNOSIS — K219 Gastro-esophageal reflux disease without esophagitis: Secondary | ICD-10-CM | POA: Diagnosis not present

## 2014-06-04 DIAGNOSIS — F329 Major depressive disorder, single episode, unspecified: Secondary | ICD-10-CM | POA: Insufficient documentation

## 2014-06-04 DIAGNOSIS — Z862 Personal history of diseases of the blood and blood-forming organs and certain disorders involving the immune mechanism: Secondary | ICD-10-CM | POA: Diagnosis not present

## 2014-06-04 DIAGNOSIS — J438 Other emphysema: Secondary | ICD-10-CM | POA: Diagnosis not present

## 2014-06-04 DIAGNOSIS — Z79899 Other long term (current) drug therapy: Secondary | ICD-10-CM | POA: Diagnosis not present

## 2014-06-04 DIAGNOSIS — F3289 Other specified depressive episodes: Secondary | ICD-10-CM | POA: Insufficient documentation

## 2014-06-04 DIAGNOSIS — F17200 Nicotine dependence, unspecified, uncomplicated: Secondary | ICD-10-CM | POA: Insufficient documentation

## 2014-06-04 DIAGNOSIS — R45851 Suicidal ideations: Secondary | ICD-10-CM | POA: Diagnosis present

## 2014-06-04 LAB — HERPES SIMPLEX VIRUS CULTURE: CULTURE: DETECTED

## 2014-06-04 LAB — CBC
HEMATOCRIT: 37 % (ref 36.0–46.0)
Hemoglobin: 11.1 g/dL — ABNORMAL LOW (ref 12.0–15.0)
MCH: 22.3 pg — AB (ref 26.0–34.0)
MCHC: 30 g/dL (ref 30.0–36.0)
MCV: 74.3 fL — ABNORMAL LOW (ref 78.0–100.0)
Platelets: 260 10*3/uL (ref 150–400)
RBC: 4.98 MIL/uL (ref 3.87–5.11)
RDW: 16.7 % — AB (ref 11.5–15.5)
WBC: 7.2 10*3/uL (ref 4.0–10.5)

## 2014-06-04 LAB — COMPREHENSIVE METABOLIC PANEL
ALBUMIN: 3.8 g/dL (ref 3.5–5.2)
ALK PHOS: 76 U/L (ref 39–117)
ALT: 10 U/L (ref 0–35)
AST: 19 U/L (ref 0–37)
Anion gap: 14 (ref 5–15)
BUN: 8 mg/dL (ref 6–23)
CALCIUM: 9.4 mg/dL (ref 8.4–10.5)
CO2: 23 mEq/L (ref 19–32)
Chloride: 103 mEq/L (ref 96–112)
Creatinine, Ser: 0.65 mg/dL (ref 0.50–1.10)
GFR calc non Af Amer: >90 mL/min (ref >90)
Glucose, Bld: 91 mg/dL (ref 70–99)
POTASSIUM: 4.4 meq/L (ref 3.7–5.3)
Sodium: 140 mEq/L (ref 137–147)
Total Bilirubin: 0.3 mg/dL (ref 0.3–1.2)
Total Protein: 7.6 g/dL (ref 6.0–8.3)

## 2014-06-04 LAB — RAPID URINE DRUG SCREEN, HOSP PERFORMED
Amphetamines: NOT DETECTED
Barbiturates: NOT DETECTED
Benzodiazepines: NOT DETECTED
Cocaine: NOT DETECTED
OPIATES: NOT DETECTED
TETRAHYDROCANNABINOL: NOT DETECTED

## 2014-06-04 LAB — ETHANOL: ALCOHOL ETHYL (B): 28 mg/dL — AB (ref 0–11)

## 2014-06-04 LAB — ACETAMINOPHEN LEVEL

## 2014-06-04 LAB — SALICYLATE LEVEL

## 2014-06-04 MED ORDER — ZOLPIDEM TARTRATE 5 MG PO TABS: 5.0000 mg | ORAL_TABLET | Freq: Every evening | ORAL | Status: DC | PRN

## 2014-06-04 MED ORDER — ONDANSETRON HCL 4 MG PO TABS: 4.0000 mg | ORAL_TABLET | Freq: Three times a day (TID) | ORAL | Status: DC | PRN

## 2014-06-04 MED ORDER — LORAZEPAM 1 MG PO TABS: 1.0000 mg | ORAL_TABLET | Freq: Three times a day (TID) | ORAL | Status: DC | PRN

## 2014-06-04 MED ORDER — ACETAMINOPHEN 325 MG PO TABS: 650.0000 mg | ORAL_TABLET | ORAL | Status: DC | PRN

## 2014-06-04 MED ORDER — ALUM & MAG HYDROXIDE-SIMETH 200-200-20 MG/5ML PO SUSP: 30.0000 mL | ORAL | Status: DC | PRN

## 2014-06-04 MED ORDER — IBUPROFEN 200 MG PO TABS: 600.0000 mg | ORAL_TABLET | Freq: Three times a day (TID) | ORAL | Status: DC | PRN

## 2014-06-04 NOTE — ED Notes (Signed)
Patient cooperative, irritated. Denies SI, HI, AVH at present. Admits to calling hotline and expressing SI prior to being brought to the hospital. States that she is angry that she is at the hospital. Rates anxiety 5/10, feelings of depression 6/10. Reports that she has been having trouble falling and staying asleep. Reports that her appetite fluctuates over the last few months. States that family and friends are current stressors in her life. Reports hx of COPD and acid reflux.  Encouragement offered. Oriented to the unit.  Q 15 safety checks in place.

## 2014-06-04 NOTE — Progress Notes (Signed)
Pt was given the [employer] information. Pt encouraged to read material and ask questions if she had any. Pt alert and made eye contact.

## 2014-06-04 NOTE — ED Provider Notes (Signed)
Medical screening examination/treatment/procedure(s) were performed by non-physician practitioner and as supervising physician I was immediately available for consultation/collaboration.   EKG Interpretation None        Julianne Rice, MD 06/04/14 0600

## 2014-06-04 NOTE — Consult Note (Signed)
[facility] Consult   Reason for Consult:  Suicidal Ideation  Referring Physician:  EDP  Barbara Stanley is an 43 y.o. female. Total Time spent with patient: 45 minutes  Assessment: AXIS I:  Anxiety Disorder NOS and Depressive Disorder NOS AXIS II:  Deferred AXIS III:   Past Medical History  Diagnosis Date  . Depression   . COPD (chronic obstructive pulmonary disease)   . Emphysema   . GERD (gastroesophageal reflux disease)   . Anemia   . Tobacco abuse    AXIS IV:  other psychosocial or environmental problems AXIS V:  61-70 mild symptoms  Plan:  No evidence of imminent risk to self or others at present.   Patient does not meet criteria for psychiatric inpatient admission. Supportive therapy provided about ongoing stressors. Discussed crisis plan, support from social network, calling 911, coming to the Emergency Department, and calling Suicide Hotline.  Subjective:   Barbara Stanley is a 43 y.o. female patient.  HPI:  Patient states that she called hot line because she needed someone to talk to.  "I'm here cause I said a bunch of stuff on the hot line that I shouldn't have said; I told them I'll take a bunch of pills and go to sleep.  I was just upset and angry about a bunch of stuff.  I have been dealing with a lot of stuff from my past with my therapist.  My ex-boyfriend just died 01-04-2023; and my boyfriend just decided that he wanted to break up and move out.  I really just wanted to sleep and to get away from everything for a day or two.  No, I don't, want to kill my self."  Patient denies suicidal/homicidal ideation, psychosis, and paranoia.  Patient has outpatient services with psychiatrist and therapist and has an appointment "This 2023/01/04."  HPI Elements:   Location:  Depression. Quality:  ideation. Severity:  stating that she would take a bunch of pills and go to sleep.. Timing:  1 day.  Past Psychiatric History: Past Medical History  Diagnosis Date  .  Depression   . COPD (chronic obstructive pulmonary disease)   . Emphysema   . GERD (gastroesophageal reflux disease)   . Anemia   . Tobacco abuse     reports that she has been smoking Cigarettes.  She has been smoking about 2.00 packs per day. She does not have any smokeless tobacco history on file. She reports that she drinks alcohol. She reports that she does not use illicit drugs. Family History  Problem Relation Age of Onset  . Diabetes Mother   . Diabetes Other   . Hypertension Other   . CAD Other            Allergies:  No Known Allergies  ACT Assessment Complete:  Yes:    Educational Status    Risk to Self: Risk to self with the past 6 months Is patient at risk for suicide?: Yes Substance abuse history and/or treatment for substance abuse?: Yes  Risk to Others:    Abuse:    Prior Inpatient Therapy:    Prior Outpatient Therapy:    Additional Information:      Family History  Problem Relation Age of Onset  . Diabetes Mother   . Diabetes Other   . Hypertension Other   . CAD Other    Review of Systems  Constitutional: Negative for weight loss, malaise/fatigue and diaphoresis.  Respiratory: Negative.   Musculoskeletal: Negative.   Neurological: Negative for  loss of consciousness and headaches.  Psychiatric/Behavioral: Positive for depression. Negative for suicidal ideas, hallucinations and substance abuse. The patient is nervous/anxious.      Objective: Blood pressure 124/72, pulse 65, temperature 98.1 F (36.7 C), temperature source Oral, resp. rate 18, SpO2 97.00%.There is no weight on file to calculate BMI. Results for orders placed during the hospital encounter of 06/04/14 (from the past 72 hour(s))  URINE RAPID DRUG SCREEN (HOSP PERFORMED)     Status: None   Collection Time    06/04/14  4:37 AM      Result Value Ref Range   Opiates NONE DETECTED  NONE DETECTED   Cocaine NONE DETECTED  NONE DETECTED   Benzodiazepines NONE DETECTED  NONE DETECTED    Amphetamines NONE DETECTED  NONE DETECTED   Tetrahydrocannabinol NONE DETECTED  NONE DETECTED   Barbiturates NONE DETECTED  NONE DETECTED   Comment:            DRUG SCREEN FOR MEDICAL PURPOSES     ONLY.  IF CONFIRMATION IS NEEDED     FOR ANY PURPOSE, NOTIFY LAB     WITHIN 5 DAYS.                LOWEST DETECTABLE LIMITS     FOR URINE DRUG SCREEN     Drug Class       Cutoff (ng/mL)     Amphetamine      1000     Barbiturate      200     Benzodiazepine   056     Tricyclics       979     Opiates          300     Cocaine          300     THC              50  ACETAMINOPHEN LEVEL     Status: None   Collection Time    06/04/14  4:45 AM      Result Value Ref Range   Acetaminophen (Tylenol), Serum <15.0  10 - 30 ug/mL   Comment:            THERAPEUTIC CONCENTRATIONS VARY     SIGNIFICANTLY. A RANGE OF 10-30     ug/mL MAY BE AN EFFECTIVE     CONCENTRATION FOR MANY PATIENTS.     HOWEVER, SOME ARE BEST TREATED     AT CONCENTRATIONS OUTSIDE THIS     RANGE.     ACETAMINOPHEN CONCENTRATIONS     >150 ug/mL AT 4 HOURS AFTER     INGESTION AND >50 ug/mL AT 12     HOURS AFTER INGESTION ARE     OFTEN ASSOCIATED WITH TOXIC     REACTIONS.  CBC     Status: Abnormal   Collection Time    06/04/14  4:45 AM      Result Value Ref Range   WBC 7.2  4.0 - 10.5 K/uL   RBC 4.98  3.87 - 5.11 MIL/uL   Hemoglobin 11.1 (*) 12.0 - 15.0 g/dL   HCT 37.0  36.0 - 46.0 %   MCV 74.3 (*) 78.0 - 100.0 fL   MCH 22.3 (*) 26.0 - 34.0 pg   MCHC 30.0  30.0 - 36.0 g/dL   RDW 16.7 (*) 11.5 - 15.5 %   Platelets 260  150 - 400 K/uL  COMPREHENSIVE METABOLIC PANEL     Status: None  Collection Time    06/04/14  4:45 AM      Result Value Ref Range   Sodium 140  137 - 147 mEq/L   Potassium 4.4  3.7 - 5.3 mEq/L   Chloride 103  96 - 112 mEq/L   CO2 23  19 - 32 mEq/L   Glucose, Bld 91  70 - 99 mg/dL   BUN 8  6 - 23 mg/dL   Creatinine, Ser 0.65  0.50 - 1.10 mg/dL   Calcium 9.4  8.4 - 10.5 mg/dL   Total Protein 7.6  6.0  - 8.3 g/dL   Albumin 3.8  3.5 - 5.2 g/dL   AST 19  0 - 37 U/L   ALT 10  0 - 35 U/L   Alkaline Phosphatase 76  39 - 117 U/L   Total Bilirubin 0.3  0.3 - 1.2 mg/dL   GFR calc non Af Amer >90  >90 mL/min   GFR calc Af Amer >90  >90 mL/min   Comment: (NOTE)     The eGFR has been calculated using the CKD EPI equation.     This calculation has not been validated in all clinical situations.     eGFR's persistently <90 mL/min signify possible Chronic Kidney     Disease.   Anion gap 14  5 - 15  ETHANOL     Status: Abnormal   Collection Time    06/04/14  4:45 AM      Result Value Ref Range   Alcohol, Ethyl (B) 28 (*) 0 - 11 mg/dL   Comment:            LOWEST DETECTABLE LIMIT FOR     SERUM ALCOHOL IS 11 mg/dL     FOR MEDICAL PURPOSES ONLY  SALICYLATE LEVEL     Status: Abnormal   Collection Time    06/04/14  4:45 AM      Result Value Ref Range   Salicylate Lvl <8.3 (*) 2.8 - 20.0 mg/dL   Labs are reviewed see above values.  Medications reviewed and no changes made.  Current Facility-Administered Medications  Medication Dose Route Frequency Provider Last Rate Last Dose  . acetaminophen (TYLENOL) tablet 650 mg  650 mg Oral Q4H PRN Resa Miner Lawyer, PA-C      . alum & mag hydroxide-simeth (MAALOX/MYLANTA) 200-200-20 MG/5ML suspension 30 mL  30 mL Oral PRN Resa Miner Lawyer, PA-C      . ibuprofen (ADVIL,MOTRIN) tablet 600 mg  600 mg Oral Q8H PRN Resa Miner Lawyer, PA-C      . LORazepam (ATIVAN) tablet 1 mg  1 mg Oral Q8H PRN Resa Miner Lawyer, PA-C      . ondansetron [facility]) tablet 4 mg  4 mg Oral Q8H PRN Resa Miner Lawyer, PA-C      . zolpidem (AMBIEN) tablet 5 mg  5 mg Oral QHS PRN Brent General, PA-C       Current Outpatient Prescriptions  Medication Sig Dispense Refill  . budesonide-formoterol (SYMBICORT) 160-4.5 MCG/ACT inhaler Inhale 2 puffs into the lungs 2 (two) times daily.  1 Inhaler  12  . esomeprazole (NEXIUM) 40 MG capsule Take 1 capsule (40 mg total)  by mouth daily at 12 noon.      . hydrOXYzine (ATARAX/VISTARIL) 25 MG tablet Take 25 mg by mouth every 4 (four) hours as needed for anxiety.      [name] ibuprofen (ADVIL,MOTRIN) 800 MG tablet Take 800 mg by mouth every 8 (eight) hours as needed for  moderate pain.      [name] tiotropium (SPIRIVA) 18 MCG inhalation capsule Place 1 capsule (18 mcg total) into inhaler and inhale daily.  30 capsule  12  . albuterol (PROAIR HFA) 108 (90 BASE) MCG/ACT inhaler Inhale 2 puffs into the lungs every 6 (six) hours as needed for wheezing or shortness of breath.      . valACYclovir (VALTREX) 1000 MG tablet Take 2 tablets (2,000 mg total) by mouth 2 (two) times daily.  4 tablet  0    Psychiatric Specialty Exam:     Blood pressure 124/72, pulse 65, temperature 98.1 F (36.7 C), temperature source Oral, resp. rate 18, SpO2 97.00%.There is no weight on file to calculate BMI.  General Appearance: Casual  Eye Contact::  Good  Speech:  Clear and Coherent and Normal Rate  Volume:  Normal  Mood:  Anxious and Depressed  Affect:  Appropriate and Congruent  Thought Process:  Circumstantial and Goal Directed  Orientation:  Full (Time, Place, and Person)  Thought Content:  WDL  Suicidal Thoughts:  No  Homicidal Thoughts:  No  Memory:  Immediate;   Good Recent;   Good Remote;   Good  Judgement:  Fair  Insight:  Present  Psychomotor Activity:  Normal  Concentration:  Fair  Recall:  Good  Fund of Knowledge:Good  Language: Good  Akathisia:  No  Handed:  Right  AIMS (if indicated):     Assets:  Communication Skills Desire for Improvement Housing Social Support  Sleep:      Musculoskeletal: Strength & Muscle Tone: within normal limits Gait & Station: normal Patient leans: N/A  Treatment Plan Summary: Discharge home.  Patient to keep scheduled appointment with outpatient provider [facility] [place]) for Friday 06/06/2014.  Takeesha Isley, FNP-BC 06/04/2014 10:29 AM

## 2014-06-04 NOTE — [facility] Suicide Risk Assessment (Cosign Needed)
Suicide Risk Assessment  Discharge Assessment     Demographic Factors:  Caucasian  Total Time spent with patient: 30 minutes  Psychiatric Specialty Exam:     Blood pressure 124/72, pulse 65, temperature 98.1 F (36.7 C), temperature source Oral, resp. rate 18, SpO2 97.00%.There is no weight on file to calculate BMI.   General Appearance: Casual   Eye Contact:: Good   Speech: Clear and Coherent and Normal Rate   Volume: Normal   Mood: Anxious and Depressed   Affect: Appropriate and Congruent   Thought Process: Circumstantial and Goal Directed   Orientation: Full (Time, Place, and Person)   Thought Content: WDL   Suicidal Thoughts: No   Homicidal Thoughts: No   Memory: Immediate; Good  Recent; Good  Remote; Good   Judgement: Fair   Insight: Present   Psychomotor Activity: Normal   Concentration: Fair   Recall: Good   Fund of Knowledge:Good   Language: Good   Akathisia: No   Handed: Right   AIMS (if indicated):   Assets: Communication Skills  Desire for Improvement  Housing  Social Support   Sleep:   Musculoskeletal:  Strength & Muscle Tone: within normal limits  Gait & Station: normal  Patient leans: N/A  Mental Status Per Nursing Assessment::   On Admission:     Current Mental Status by Physician: Patient denies suicidal/homicidal ideation, psychosis, and paranoia  Loss Factors: NA  Historical Factors: NA  Risk Reduction Factors:   Responsible for children under 45 years of age, Sense of responsibility to family, Living with another person, especially a relative and Positive therapeutic relationship  Continued Clinical Symptoms:  Previous Psychiatric Diagnoses and Treatments  Cognitive Features That Contribute To Risk:  None noted    Suicide Risk:  Minimal: No identifiable suicidal ideation.  Patients presenting with no risk factors but with morbid ruminations; may be classified as minimal risk based on the severity of the depressive  symptoms  Discharge Diagnoses:  AXIS I: Anxiety Disorder NOS and Depressive Disorder NOS  AXIS II: Deferred  AXIS III:  Past Medical History   Diagnosis  Date   .  Depression    .  COPD (chronic obstructive pulmonary disease)    .  Emphysema    .  GERD (gastroesophageal reflux disease)    .  Anemia    .  Tobacco abuse     AXIS IV: other psychosocial or environmental problems  AXIS V: 61-70 mild symptoms   Plan Of Care/Follow-up recommendations:  Activity:  At tolerated Diet:  as tolerated  Is patient on multiple antipsychotic therapies at discharge:  No   Has Patient had three or more failed trials of antipsychotic monotherapy by history:  No  Recommended Plan for Multiple Antipsychotic Therapies: NA    Rankin, Shuvon, FNP-BC 06/04/2014, 10:55 AM

## 2014-06-04 NOTE — ED Notes (Signed)
PA at bedside.

## 2014-06-04 NOTE — ED Notes (Signed)
Pt brought in by [employer] voluntarily  Pt called the suicide hotline multiple times today telling them she was going to kill herself  Pt states she had access to medication, alcohol, and a box cutter  Suicide hotline called the police to go check on her and she verified to the police what she had told them   Upon interviewing pt she states she is here because she said something to someone earlier and now she is being forced to be here  Pt denies any suicidal thoughts at this time  Pt is agitated but cooperative at this time

## 2014-06-04 NOTE — ED Provider Notes (Signed)
CSN: [id number]     Arrival date & time 06/04/14  0865 History   First MD Initiated Contact with Patient 06/04/14 0404     Chief Complaint  Patient presents with  . Suicidal     (Consider location/radiation/quality/duration/timing/severity/associated sxs/prior Treatment) HPI Patient presents to the emergency department with suicidal ideation.  The patient, states, that she has attempted suicide in the past.  The patient was brought in by [employer].  The patient, states, that she was forced to come here.  The patient denies hallucinations, nausea, vomiting, diarrhea, headache, blurred vision, chest pain, shortness of breath, fever, weakness, dizziness, or syncope.  Patient, states nothing seems make her condition, better or worse.  The patient, states she has had multiple issues lately, that has caused her to feel this way Past Medical History  Diagnosis Date  . Depression   . COPD (chronic obstructive pulmonary disease)   . Emphysema   . GERD (gastroesophageal reflux disease)   . Anemia   . Tobacco abuse    Past Surgical History  Procedure Laterality Date  . C section x 3     . Cholecystectomy    . Gastric bypass    . Abdominal hysterectomy     Family History  Problem Relation Age of Onset  . Diabetes Mother   . Diabetes Other   . Hypertension Other   . CAD Other    History  Substance Use Topics  . Smoking status: Current Every Day Smoker -- 2.00 packs/day    Types: Cigarettes  . Smokeless tobacco: Not on file  . Alcohol Use: Yes     Comment: occ   OB History   Grav Para Term Preterm Abortions TAB SAB Ect Mult Living                 Review of Systems  All other systems negative except as documented in the HPI. All pertinent positives and negatives as reviewed in the HPI.  Allergies  Review of patient's allergies indicates no known allergies.  Home Medications   Prior to Admission medications   Medication Sig Start Date End Date Taking? Authorizing Provider   albuterol (PROAIR HFA) 108 (90 BASE) MCG/ACT inhaler Inhale 2 puffs into the lungs every 6 (six) hours as needed for wheezing or shortness of breath.    Historical Provider, MD  budesonide-formoterol (SYMBICORT) 160-4.5 MCG/ACT inhaler Inhale 2 puffs into the lungs 2 (two) times daily. 03/25/14   Benjamine Mola, FNP  buPROPion (WELLBUTRIN XL) 150 MG 24 hr tablet Take 1 tablet (150 mg total) by mouth daily. 03/25/14   Benjamine Mola, FNP  esomeprazole (NEXIUM) 40 MG capsule Take 1 capsule (40 mg total) by mouth daily at 12 noon. 03/25/14   Benjamine Mola, FNP  hydrOXYzine (ATARAX/VISTARIL) 25 MG tablet Take 1 tablet (25 mg total) by mouth every 6 (six) hours as needed for anxiety. 03/25/14   Benjamine Mola, FNP  tiotropium (SPIRIVA) 18 MCG inhalation capsule Place 1 capsule (18 mcg total) into inhaler and inhale daily. 03/25/14   Benjamine Mola, FNP  traZODone (DESYREL) 50 MG tablet Take 1 tablet (50 mg total) by mouth at bedtime as needed for sleep. 03/25/14   Benjamine Mola, FNP  valACYclovir (VALTREX) 1000 MG tablet Take 2 tablets (2,000 mg total) by mouth 2 (two) times daily. 06/02/14   Gregor Hams, MD   BP 123/72  Pulse 73  Temp(Src) 98.6 F (37 C) (Oral)  Resp 16  SpO2 97%  Physical Exam  Nursing note and vitals reviewed. Constitutional: She is oriented to person, place, and time. She appears well-developed and well-nourished. No distress.  HENT:  Head: Normocephalic and atraumatic.  Mouth/Throat: Oropharynx is clear and moist.  Eyes: Pupils are equal, round, and reactive to light.  Neck: Normal range of motion. Neck supple.  Cardiovascular: Normal rate, regular rhythm and normal heart sounds.   Pulmonary/Chest: Effort normal and breath sounds normal. No respiratory distress.  Neurological: She is alert and oriented to person, place, and time. She exhibits normal muscle tone. Coordination normal.  Skin: Skin is warm and dry.    ED Course  Procedures (including critical care time) Labs  Review Labs Reviewed  ACETAMINOPHEN LEVEL  CBC  COMPREHENSIVE METABOLIC PANEL  ETHANOL  SALICYLATE LEVEL  URINE RAPID DRUG SCREEN (HOSP PERFORMED)   Patient will need further psychiatric evaluation of her suicidal thoughts and ideation    Brent General, PA-C 06/04/14 0410

## 2014-06-04 NOTE — Discharge Instructions (Signed)
Depression Depression refers to feeling sad, low, down in the dumps, blue, gloomy, or empty. In general, there are two kinds of depression: 1. Normal sadness or normal grief. This kind of depression is one that we all feel from time to time after upsetting life experiences, such as the loss of a job or the ending of a relationship. This kind of depression is considered normal, is short lived, and resolves within a few days to 2 weeks. Depression experienced after the loss of a loved one (bereavement) often lasts longer than 2 weeks but normally gets better with time. 2. Clinical depression. This kind of depression lasts longer than normal sadness or normal grief or interferes with your ability to function at home, at work, and in school. It also interferes with your personal relationships. It affects almost every aspect of your life. Clinical depression is an illness. Symptoms of depression can also be caused by conditions other than those mentioned above, such as:  Physical illness. Some physical illnesses, including underactive thyroid gland (hypothyroidism), severe anemia, specific types of cancer, diabetes, uncontrolled seizures, heart and lung problems, strokes, and chronic pain are commonly associated with symptoms of depression.  Side effects of some prescription medicine. In some people, certain types of medicine can cause symptoms of depression.  Substance abuse. Abuse of alcohol and illicit drugs can cause symptoms of depression. SYMPTOMS Symptoms of normal sadness and normal grief include the following:  Feeling sad or crying for short periods of time.  Not caring about anything (apathy).  Difficulty sleeping or sleeping too much.  No longer able to enjoy the things you used to enjoy.  Desire to be by oneself all the time (social isolation).  Lack of energy or motivation.  Difficulty concentrating or remembering.  Change in appetite or weight.  Restlessness or  agitation. Symptoms of clinical depression include the same symptoms of normal sadness or normal grief and also the following symptoms:  Feeling sad or crying all the time.  Feelings of guilt or worthlessness.  Feelings of hopelessness or helplessness.  Thoughts of suicide or the desire to harm yourself (suicidal ideation).  Loss of touch with reality (psychotic symptoms). Seeing or hearing things that are not real (hallucinations) or having false beliefs about your life or the people around you (delusions and paranoia). DIAGNOSIS  The diagnosis of clinical depression is usually based on how bad the symptoms are and how long they have lasted. Your health care provider will also ask you questions about your medical history and substance use to find out if physical illness, use of prescription medicine, or substance abuse is causing your depression. Your health care provider may also order blood tests. TREATMENT  Often, normal sadness and normal grief do not require treatment. However, sometimes antidepressant medicine is given for bereavement to ease the depressive symptoms until they resolve. The treatment for clinical depression depends on how bad the symptoms are but often includes antidepressant medicine, counseling with a mental health professional, or both. Your health care provider will help to determine what treatment is best for you. Depression caused by physical illness usually goes away with appropriate medical treatment of the illness. If prescription medicine is causing depression, talk with your health care provider about stopping the medicine, decreasing the dose, or changing to another medicine. Depression caused by the abuse of alcohol or illicit drugs goes away when you stop using these substances. Some adults need professional help in order to stop drinking or using drugs. [place]  CARE IF:  You have thoughts about hurting yourself or others.  You lose touch  with reality (have psychotic symptoms).  You are taking medicine for depression and have a serious side effect. FOR MORE INFORMATION  National Alliance on Mental Illness: www.nami.[employer] of Mental Health: https://carter.com/ Document Released: 09/30/2000 Document Revised: 02/17/2014 Document Reviewed: 01/02/2012 [facility] Patient Information 2015 [place], [place]. This information is not intended to replace advice given to you by your health care provider. Make sure you discuss any questions you have with your health care provider.  Stress Stress-related medical problems are becoming increasingly common. The body has a built-in physical response to stressful situations. Faced with pressure, challenge or danger, we need to react quickly. Our bodies release hormones such as cortisol and adrenaline to help do this. These hormones are part of the "fight or flight" response and affect the metabolic rate, heart rate and blood pressure, resulting in a heightened, stressed state that prepares the body for optimum performance in dealing with a stressful situation. It is likely that early man required these mechanisms to stay alive, but usually modern stresses do not call for this, and the same hormones released in today's world can damage health and reduce coping ability. CAUSES  Pressure to perform at work, at school or in sports.  Threats of physical violence.  Money worries.  Arguments.  Family conflicts.  Divorce or separation from significant other.  Bereavement.  New job or unemployment.  Changes in location.  Alcohol or drug abuse. SOMETIMES, THERE IS NO PARTICULAR REASON FOR DEVELOPING STRESS. Almost all people are at risk of being stressed at some time in their lives. It is important to know that some stress is temporary and some is long term.  Temporary stress will go away when a situation is resolved. Most people can cope with short periods of stress, and it can  often be relieved by relaxing, taking a walk or getting any type of exercise, chatting through issues with friends, or having a good night's sleep.  Chronic (long-term, continuous) stress is much harder to deal with. It can be psychologically and emotionally damaging. It can be harmful both for an individual and for friends and family. SYMPTOMS Everyone reacts to stress differently. There are some common effects that help [place] recognize it. In times of extreme stress, people may:  Shake uncontrollably.  Breathe faster and deeper than normal (hyperventilate).  Vomit.  For people with asthma, stress can trigger an attack.  For some people, stress may trigger migraine headaches, ulcers, and body pain. PHYSICAL EFFECTS OF STRESS MAY INCLUDE:  Loss of energy.  Skin problems.  Aches and pains resulting from tense muscles, including neck ache, backache and tension headaches.  Increased pain from arthritis and other conditions.  Irregular heart beat (palpitations).  Periods of irritability or anger.  Apathy or depression.  Anxiety (feeling uptight or worrying).  Unusual behavior.  Loss of appetite.  Comfort eating.  Lack of concentration.  Loss of, or decreased, sex-drive.  Increased smoking, drinking, or recreational drug use.  For women, missed periods.  Ulcers, joint pain, and muscle pain. Post-traumatic stress is the stress caused by any serious accident, strong emotional damage, or extremely difficult or violent experience such as rape or war. Post-traumatic stress victims can experience mixtures of emotions such as fear, shame, depression, guilt or anger. It may include recurrent memories or images that may be haunting. These feelings can last for weeks, months or even years after the traumatic  event that triggered them. Specialized treatment, possibly with medicines and psychological therapies, is available. If stress is causing physical symptoms, severe distress or  making it difficult for you to function as normal, it is worth seeing your caregiver. It is important to remember that although stress is a usual part of life, extreme or prolonged stress can lead to other illnesses that will need treatment. It is better to visit a doctor sooner rather than later. Stress has been linked to the development of high blood pressure and heart disease, as well as insomnia and depression. There is no diagnostic test for stress since everyone reacts to it differently. But a caregiver will be able to spot the physical symptoms, such as:  Headaches.  Shingles.  Ulcers. Emotional distress such as intense worry, low mood or irritability should be detected when the doctor asks pertinent questions to identify any underlying problems that might be the cause. In case there are physical reasons for the symptoms, the doctor may also want to do some tests to exclude certain conditions. If you feel that you are suffering from stress, try to identify the aspects of your life that are causing it. Sometimes you may not be able to change or avoid them, but even a small change can have a positive ripple effect. A simple lifestyle change can make all the difference. STRATEGIES THAT CAN HELP DEAL WITH STRESS:  Delegating or sharing responsibilities.  Avoiding confrontations.  Learning to be more assertive.  Regular exercise.  Avoid using alcohol or street drugs to cope.  Eating a healthy, balanced diet, rich in fruit and vegetables and proteins.  Finding humor or absurdity in stressful situations.  Never taking on more than you know you can handle comfortably.  Organizing your time better to get as much done as possible.  Talking to friends or family and sharing your thoughts and fears.  Listening to music or relaxation tapes.  Relaxation techniques like deep breathing, meditation, and yoga.  Tensing and then relaxing your muscles, starting at the toes and working up to the  head and neck. If you think that you would benefit from help, either in identifying the things that are causing your stress or in learning techniques to help you relax, see a caregiver who is capable of helping you with this. Rather than relying on medications, it is usually better to try and identify the things in your life that are causing stress and try to deal with them. There are many techniques of managing stress including counseling, psychotherapy, aromatherapy, yoga, and exercise. Your caregiver can help you determine what is best for you. Document Released: 12/24/2002 Document Revised: 10/08/2013 Document Reviewed: 11/20/2007 [facility] Patient Information 2015 [place], [place]. This information is not intended to replace advice given to you by your health care provider. Make sure you discuss any questions you have with your health care provider.  Suicidal Feelings, How to Help Yourself Everyone feels sad or unhappy at times, but depressing thoughts and feelings of hopelessness can lead to thoughts of suicide. It can seem as if life is too tough to handle. If you feel as though you have reached the point where suicide is the only answer, it is time to let someone know immediately.  HOW TO COPE AND PREVENT SUICIDE  Let family, friends, teachers, or counselors know. Get help. Try not to isolate yourself from those who care about you. Even though you may not feel sociable, talk with someone every day. It is best if it  is face-to-face. Remember, they will want to help you.  Eat a regularly spaced and well-balanced diet.  Get plenty of rest.  Avoid alcohol and drugs because they will only make you feel worse and may also lower your inhibitions. Remove them from the home. If you are thinking of taking an overdose of your prescribed medicines, give your medicines to someone who can give them to you one day at a time. If you are on antidepressants, let your caregiver know of your feelings so he or she  can provide a safer medicine, if that is a concern.  Remove weapons or poisons from your home.  Try to stick to routines. Follow a schedule and remind yourself that you have to keep that schedule every day.  Set some realistic goals and achieve them. Make a list and cross things off as you go. Accomplishments give a sense of worth. Wait until you are feeling better before doing things you find difficult or unpleasant to do.  If you are able, try to start exercising. Even half-hour periods of exercise each day will make you feel better. Getting out in the sun or into nature helps you recover from depression faster. If you have a favorite place to walk, take advantage of that.  Increase safe activities that have always given you pleasure. This may include playing your favorite music, reading a good book, painting a picture, or playing your favorite instrument. Do whatever takes your mind off your depression.  Keep your living space well-lighted. GET HELP Contact a suicide hotline, crisis center, or local suicide prevention center for help right away. Local centers may include a hospital, clinic, community service organization, social service provider, or health department.  Call your local emergency services (911 in the [place]).  Call a suicide hotline:  1-800-273-TALK (1-304-696-8044) in the [place].  1-800-SUICIDE 470-644-9333) in the [place].  612-445-4286 in the [place] for Spanish-speaking counselors.  0-973-532-9JME 541-880-9114) in the [place] for TTY users.  Visit the following websites for information and help:  National Suicide Prevention Lifeline: www.suicidepreventionlifeline.org  Hopeline: www.hopeline.[place] for Suicide Prevention: PromotionalLoans.co.za  For lesbian, gay, bisexual, transgender, or questioning youth, contact [employer]:  9-798-9-Q-JJHERD 765-568-3436) in the [place].  www.thetrevorproject.org  In [place], treatment resources are listed in each [place] with listings available under US[employer] for [employer] or similar titles. Another source for Crisis Centres by [place] is located at http://www.suicideprevention.ca/in-crisis-now/find-a-crisis-centre-now/crisis-centres Document Released: 04/09/2003 Document Revised: 12/26/2011 Document Reviewed: 01/28/2014 [facility] Patient Information 2015 [place], [place]. This information is not intended to replace advice given to you by your health care provider. Make sure you discuss any questions you have with your health care provider.

## 2014-06-04 NOTE — Consult Note (Signed)
Face to face evaluation and I agree with this note 

## 2014-06-06 NOTE — ED Notes (Signed)
Chart  Reviewed   By  Dr  Georgina Snell  With lab results  For  This  Visit  No  furthur  tx  Needed

## 2014-06-08 ENCOUNTER — Telehealth (HOSPITAL_COMMUNITY): Payer: Self-pay | Admitting: *Deleted

## 2014-06-08 NOTE — ED Notes (Addendum)
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  Pt. adequately treated with Valtrex.  I called pt. and left a message to call. Call 1. Roselyn Meier 06/08/2014 Unable to reach pt. by phone x 3.  Confidential marked letter sent with results and instructions. Roselyn Meier 06/12/2014

## 2014-07-21 ENCOUNTER — Other Ambulatory Visit (HOSPITAL_COMMUNITY): Payer: Self-pay | Admitting: Internal Medicine

## 2014-07-21 ENCOUNTER — Ambulatory Visit (HOSPITAL_COMMUNITY)
Admission: RE | Admit: 2014-07-21 | Discharge: 2014-07-21 | Disposition: A | Discharge status: Home / Self Care | Payer: Medicaid Other | Source: Ambulatory Visit | Attending: Diagnostic Radiology | Admitting: Diagnostic Radiology

## 2014-07-21 DIAGNOSIS — M85861 Other specified disorders of bone density and structure, right lower leg: Secondary | ICD-10-CM | POA: Insufficient documentation

## 2014-07-21 DIAGNOSIS — R52 Pain, unspecified: Secondary | ICD-10-CM

## 2014-07-21 DIAGNOSIS — G8929 Other chronic pain: Secondary | ICD-10-CM | POA: Diagnosis present

## 2014-08-26 ENCOUNTER — Encounter (HOSPITAL_COMMUNITY): Payer: Self-pay | Admitting: Emergency Medicine

## 2014-08-26 ENCOUNTER — Emergency Department (HOSPITAL_COMMUNITY)
Admission: EM | Admit: 2014-08-26 | Discharge: 2014-08-26 | Discharge status: Against Medical Advice | Payer: Medicaid Other | Attending: Emergency Medicine | Admitting: Emergency Medicine

## 2014-08-26 DIAGNOSIS — J449 Chronic obstructive pulmonary disease, unspecified: Secondary | ICD-10-CM | POA: Diagnosis not present

## 2014-08-26 DIAGNOSIS — R441 Visual hallucinations: Secondary | ICD-10-CM | POA: Insufficient documentation

## 2014-08-26 DIAGNOSIS — R45851 Suicidal ideations: Secondary | ICD-10-CM | POA: Diagnosis present

## 2014-08-26 DIAGNOSIS — T43292A Poisoning by other antidepressants, intentional self-harm, initial encounter: Secondary | ICD-10-CM | POA: Insufficient documentation

## 2014-08-26 DIAGNOSIS — R440 Auditory hallucinations: Secondary | ICD-10-CM | POA: Diagnosis not present

## 2014-08-26 DIAGNOSIS — Z720 Tobacco use: Secondary | ICD-10-CM | POA: Diagnosis not present

## 2014-08-26 LAB — COMPREHENSIVE METABOLIC PANEL
ALT: 11 U/L (ref 0–35)
AST: 18 U/L (ref 0–37)
Albumin: 3.9 g/dL (ref 3.5–5.2)
Alkaline Phosphatase: 91 U/L (ref 39–117)
Anion gap: 13 (ref 5–15)
BUN: 10 mg/dL (ref 6–23)
CALCIUM: 9.3 mg/dL (ref 8.4–10.5)
CO2: 22 meq/L (ref 19–32)
CREATININE: 0.66 mg/dL (ref 0.50–1.10)
Chloride: 104 mEq/L (ref 96–112)
GLUCOSE: 95 mg/dL (ref 70–99)
Potassium: 4 mEq/L (ref 3.7–5.3)
Sodium: 139 mEq/L (ref 137–147)
Total Bilirubin: 0.3 mg/dL (ref 0.3–1.2)
Total Protein: 7.4 g/dL (ref 6.0–8.3)

## 2014-08-26 LAB — CBC
HCT: 37.8 % (ref 36.0–46.0)
HEMOGLOBIN: 11.8 g/dL — AB (ref 12.0–15.0)
MCH: 22.3 pg — AB (ref 26.0–34.0)
MCHC: 31.2 g/dL (ref 30.0–36.0)
MCV: 71.5 fL — AB (ref 78.0–100.0)
PLATELETS: 228 10*3/uL (ref 150–400)
RBC: 5.29 MIL/uL — AB (ref 3.87–5.11)
RDW: 16.6 % — ABNORMAL HIGH (ref 11.5–15.5)
WBC: 6.9 10*3/uL (ref 4.0–10.5)

## 2014-08-26 LAB — ETHANOL

## 2014-08-26 LAB — ACETAMINOPHEN LEVEL

## 2014-08-26 LAB — SALICYLATE LEVEL: Salicylate Lvl: <2 mg/dL — ABNORMAL LOW (ref 2.8–20.0)

## 2014-08-26 NOTE — ED Notes (Signed)
Charge RN notified that patient was gone and her scrubs were left in the floor of triage 4,

## 2014-08-26 NOTE — ED Notes (Signed)
Per pt states she took 4 300 mg Wellbutrin in an attempt to "feel better"-states she is hearing voices and seeing things

## 2014-11-04 ENCOUNTER — Encounter (HOSPITAL_COMMUNITY): Payer: Self-pay

## 2014-11-04 ENCOUNTER — Emergency Department (HOSPITAL_COMMUNITY)
Admission: EM | Admit: 2014-11-04 | Discharge: 2014-11-05 | Disposition: A | Discharge status: Other Acute Inpatient Hospital | Payer: Medicaid Other | Source: Home / Self Care | Attending: Emergency Medicine | Admitting: Emergency Medicine

## 2014-11-04 DIAGNOSIS — K219 Gastro-esophageal reflux disease without esophagitis: Secondary | ICD-10-CM

## 2014-11-04 DIAGNOSIS — J449 Chronic obstructive pulmonary disease, unspecified: Secondary | ICD-10-CM

## 2014-11-04 DIAGNOSIS — F32A Depression, unspecified: Secondary | ICD-10-CM

## 2014-11-04 DIAGNOSIS — R45851 Suicidal ideations: Secondary | ICD-10-CM

## 2014-11-04 DIAGNOSIS — F4310 Post-traumatic stress disorder, unspecified: Secondary | ICD-10-CM | POA: Diagnosis present

## 2014-11-04 DIAGNOSIS — Z9884 Bariatric surgery status: Secondary | ICD-10-CM

## 2014-11-04 DIAGNOSIS — Z720 Tobacco use: Secondary | ICD-10-CM

## 2014-11-04 DIAGNOSIS — R05 Cough: Secondary | ICD-10-CM | POA: Insufficient documentation

## 2014-11-04 DIAGNOSIS — Z79899 Other long term (current) drug therapy: Secondary | ICD-10-CM

## 2014-11-04 DIAGNOSIS — Z862 Personal history of diseases of the blood and blood-forming organs and certain disorders involving the immune mechanism: Secondary | ICD-10-CM | POA: Insufficient documentation

## 2014-11-04 DIAGNOSIS — F323 Major depressive disorder, single episode, severe with psychotic features: Principal | ICD-10-CM | POA: Diagnosis present

## 2014-11-04 DIAGNOSIS — F329 Major depressive disorder, single episode, unspecified: Secondary | ICD-10-CM

## 2014-11-04 DIAGNOSIS — J45909 Unspecified asthma, uncomplicated: Secondary | ICD-10-CM | POA: Diagnosis present

## 2014-11-04 DIAGNOSIS — Z833 Family history of diabetes mellitus: Secondary | ICD-10-CM

## 2014-11-04 DIAGNOSIS — Z8249 Family history of ischemic heart disease and other diseases of the circulatory system: Secondary | ICD-10-CM

## 2014-11-04 DIAGNOSIS — Z599 Problem related to housing and economic circumstances, unspecified: Secondary | ICD-10-CM

## 2014-11-04 DIAGNOSIS — F17210 Nicotine dependence, cigarettes, uncomplicated: Secondary | ICD-10-CM | POA: Diagnosis present

## 2014-11-04 DIAGNOSIS — G4700 Insomnia, unspecified: Secondary | ICD-10-CM | POA: Diagnosis present

## 2014-11-04 DIAGNOSIS — F419 Anxiety disorder, unspecified: Secondary | ICD-10-CM | POA: Diagnosis present

## 2014-11-04 DIAGNOSIS — R440 Auditory hallucinations: Secondary | ICD-10-CM

## 2014-11-04 LAB — RAPID URINE DRUG SCREEN, HOSP PERFORMED
Amphetamines: NOT DETECTED
Barbiturates: NOT DETECTED
Benzodiazepines: NOT DETECTED
COCAINE: NOT DETECTED
Opiates: NOT DETECTED
Tetrahydrocannabinol: NOT DETECTED

## 2014-11-04 LAB — COMPREHENSIVE METABOLIC PANEL
ALK PHOS: 81 U/L (ref 39–117)
ALT: 14 U/L (ref 0–35)
ANION GAP: 7 (ref 5–15)
AST: 25 U/L (ref 0–37)
Albumin: 4.2 g/dL (ref 3.5–5.2)
BUN: 7 mg/dL (ref 6–23)
CO2: 25 mmol/L (ref 19–32)
CREATININE: 0.64 mg/dL (ref 0.50–1.10)
Calcium: 8.7 mg/dL (ref 8.4–10.5)
Chloride: 108 mEq/L (ref 96–112)
GFR calc non Af Amer: >90 mL/min (ref >90)
Glucose, Bld: 102 mg/dL — ABNORMAL HIGH (ref 70–99)
POTASSIUM: 3.8 mmol/L (ref 3.5–5.1)
SODIUM: 140 mmol/L (ref 135–145)
TOTAL PROTEIN: 7 g/dL (ref 6.0–8.3)
Total Bilirubin: 0.7 mg/dL (ref 0.3–1.2)

## 2014-11-04 LAB — CBC WITH DIFFERENTIAL/PLATELET
Basophils Absolute: 0.1 10*3/uL (ref 0.0–0.1)
Basophils Relative: 1 % (ref 0–1)
EOS ABS: 0.1 10*3/uL (ref 0.0–0.7)
Eosinophils Relative: 1 % (ref 0–5)
HCT: 37.9 % (ref 36.0–46.0)
HEMOGLOBIN: 11.5 g/dL — AB (ref 12.0–15.0)
Lymphocytes Relative: 28 % (ref 12–46)
Lymphs Abs: 1.6 10*3/uL (ref 0.7–4.0)
MCH: 22.5 pg — ABNORMAL LOW (ref 26.0–34.0)
MCHC: 30.3 g/dL (ref 30.0–36.0)
MCV: 74.2 fL — ABNORMAL LOW (ref 78.0–100.0)
MONOS PCT: 3 % (ref 3–12)
Monocytes Absolute: 0.2 10*3/uL (ref 0.1–1.0)
Neutro Abs: 3.7 10*3/uL (ref 1.7–7.7)
Neutrophils Relative %: 67 % (ref 43–77)
Platelets: 239 10*3/uL (ref 150–400)
RBC: 5.11 MIL/uL (ref 3.87–5.11)
RDW: 18.2 % — ABNORMAL HIGH (ref 11.5–15.5)
WBC: 5.7 10*3/uL (ref 4.0–10.5)

## 2014-11-04 LAB — URINALYSIS, ROUTINE W REFLEX MICROSCOPIC
Bilirubin Urine: NEGATIVE
GLUCOSE, UA: NEGATIVE mg/dL
Hgb urine dipstick: NEGATIVE
Ketones, ur: NEGATIVE mg/dL
Leukocytes, UA: NEGATIVE
Nitrite: NEGATIVE
Protein, ur: NEGATIVE mg/dL
Specific Gravity, Urine: 1.016 (ref 1.005–1.030)
Urobilinogen, UA: 1 mg/dL (ref 0.0–1.0)
pH: 5.5 (ref 5.0–8.0)

## 2014-11-04 LAB — ETHANOL

## 2014-11-04 MED ORDER — ALBUTEROL SULFATE HFA 108 (90 BASE) MCG/ACT IN AERS: 2.0000 | INHALATION_SPRAY | Freq: Four times a day (QID) | RESPIRATORY_TRACT | Status: DC | PRN

## 2014-11-04 MED ORDER — IBUPROFEN 200 MG PO TABS: 600.0000 mg | ORAL_TABLET | Freq: Three times a day (TID) | ORAL | Status: DC | PRN

## 2014-11-04 MED ORDER — HYDROXYZINE HCL 25 MG PO TABS
25.0000 mg | ORAL_TABLET | ORAL | Status: DC | PRN
  Administered 2014-11-04: 25 mg via ORAL
  Filled 2014-11-04: qty 1

## 2014-11-04 MED ORDER — PANTOPRAZOLE SODIUM 40 MG PO TBEC
40.0000 mg | DELAYED_RELEASE_TABLET | Freq: Every day | ORAL | Status: DC
  Administered 2014-11-04: 40 mg via ORAL
  Filled 2014-11-04: qty 1

## 2014-11-04 MED ORDER — BUDESONIDE-FORMOTEROL FUMARATE 160-4.5 MCG/ACT IN AERO
2.0000 | INHALATION_SPRAY | Freq: Two times a day (BID) | RESPIRATORY_TRACT | Status: DC
  Administered 2014-11-04: 2 via RESPIRATORY_TRACT
  Filled 2014-11-04: qty 6

## 2014-11-04 MED ORDER — QUETIAPINE FUMARATE ER 300 MG PO TB24
600.0000 mg | ORAL_TABLET | Freq: Every day | ORAL | Status: DC
  Administered 2014-11-04: 600 mg via ORAL
  Filled 2014-11-04 (×2): qty 2

## 2014-11-04 MED ORDER — ACETAMINOPHEN 325 MG PO TABS: 650.0000 mg | ORAL_TABLET | ORAL | Status: DC | PRN
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
## 2014-11-04 NOTE — BH Assessment (Signed)
[facility] Assessment Progress Note      This writer attempted to connect via tele assessment, but unable to get an answer on [facility] machine. Spoke with Princeton Santiago Glad to request pt and machine be moved to Consult room where reception is better.  While awaiting relocation, this writer was notified there was a walk-in at [facility].  Attempted to contact [facility] TTS counselor and TTS lead counselor temporarily located at [facility] but unable to do so.  Notified stationed counselor, Waldon Merl who will see the patient when she completes current assessment.

## 2014-11-04 NOTE — ED Notes (Signed)
Patient sitting down on floor at the foot of her bed. Patient reports increase anxiety. Patient states " I feel comfortable and safe here". Writer offers medication for anxiety to patient and patient accepts. Respirations equal and unlabored, skin warm and dry. NAD. Encouragement and support provided and safety maintain. Patient will be medicated per MAR. Q 15 min safety checks remain in place.

## 2014-11-04 NOTE — BHH Counselor (Addendum)
Pt has yet to have a 1st opinion. TTS counselor spoke with Dr Alvino Chapel, whom stated that 1st opinion would be completed in the morning.  AC (Tori) stated that pt needs a first opinion prior to coming over [facility].   Ramond Dial, [facility] Triage Specialist

## 2014-11-04 NOTE — ED Provider Notes (Signed)
CSN: [id number]     Arrival date & time 11/04/14  1639 History   First MD Initiated Contact with Patient 11/04/14 1650     Chief Complaint  Patient presents with  . Suicidal  . Hallucinations     (Consider location/radiation/quality/duration/timing/severity/associated sxs/prior Treatment) The history is provided by the patient.   patient with depression and suicidal thoughts. Also some hallucinations. She has a history of both. She saw her social worker today who involuntarily committed her. Patient states she has had depression for a long time. She states she has had hallucinations in the past they started up a few months ago. She gets seen at [facility] and has been seen since they started back up. She has had an adjustment of her medicines since this is started. She's continued to have an angry hallucination. It does command her to hurt herself but she states she would not do it. She states she's been dealing with this for years. She states she was not involuntarily committed at the office today for when she got home the please were waiting for her. Patient states she's had some chronic suicidal thoughts and she has had a previous attempt but she states that was years ago. I discussed with the patient's boyfriend, who stated that he is not worried about her attempting. He is aware of her chronic history and states this is unchanged from her baseline. She denies substance abuse.  Past Medical History  Diagnosis Date  . Depression   . COPD (chronic obstructive pulmonary disease)   . Emphysema   . GERD (gastroesophageal reflux disease)   . Anemia   . Tobacco abuse    Past Surgical History  Procedure Laterality Date  . C section x 3     . Cholecystectomy    . Gastric bypass    . Abdominal hysterectomy     Family History  Problem Relation Age of Onset  . Diabetes Mother   . Diabetes Other   . Hypertension Other   . CAD Other    History  Substance Use Topics  . Smoking status:  Current Every Day Smoker -- 2.00 packs/day    Types: Cigarettes  . Smokeless tobacco: Not on file  . Alcohol Use: Yes     Comment: occ   OB History    No data available     Review of Systems  Constitutional: Negative for activity change and appetite change.  Eyes: Negative for pain.  Respiratory: Positive for cough. Negative for chest tightness and shortness of breath.   Cardiovascular: Negative for chest pain and leg swelling.  Gastrointestinal: Negative for nausea, vomiting, abdominal pain and diarrhea.  Genitourinary: Negative for flank pain.  Musculoskeletal: Negative for back pain and neck stiffness.  Skin: Negative for rash.  Neurological: Negative for weakness, numbness and headaches.  Psychiatric/Behavioral: Positive for suicidal ideas and hallucinations. Negative for behavioral problems.      Allergies  Review of patient's allergies indicates no known allergies.  Home Medications   Prior to Admission medications   Medication Sig Start Date End Date Taking? Authorizing Provider  albuterol (PROAIR HFA) 108 (90 BASE) MCG/ACT inhaler Inhale 2 puffs into the lungs every 6 (six) hours as needed for wheezing or shortness of breath.   Yes Historical Provider, MD  budesonide-formoterol (SYMBICORT) 160-4.5 MCG/ACT inhaler Inhale 2 puffs into the lungs 2 (two) times daily. 03/25/14  Yes Benjamine Mola, FNP  esomeprazole (NEXIUM) 40 MG capsule Take 1 capsule (40 mg total) by mouth daily  at 12 noon. Patient taking differently: Take 40 mg by mouth every morning.  03/25/14  Yes Benjamine Mola, FNP  hydrOXYzine (ATARAX/VISTARIL) 25 MG tablet Take 25 mg by mouth every 4 (four) hours as needed for anxiety.   Yes Historical Provider, MD  ibuprofen (ADVIL,MOTRIN) 800 MG tablet Take 800 mg by mouth every 8 (eight) hours as needed for moderate pain.   Yes Historical Provider, MD  QUEtiapine (SEROQUEL XR) 300 MG 24 hr tablet Take 600 mg by mouth at bedtime.   Yes Historical Provider, MD   tiotropium (SPIRIVA) 18 MCG inhalation capsule Place 1 capsule (18 mcg total) into inhaler and inhale daily. Patient taking differently: Place 18 mcg into inhaler and inhale daily as needed (COPD).  03/25/14  Yes Benjamine Mola, FNP  valACYclovir (VALTREX) 1000 MG tablet Take 2 tablets (2,000 mg total) by mouth 2 (two) times daily. Patient taking differently: Take 2,000 mg by mouth 2 (two) times daily as needed (cold sores).  06/02/14  Yes Gregor Hams, MD   BP 140/71 mmHg  Pulse 77  Temp(Src) 98.2 F (36.8 C) (Oral)  Resp 18  Ht 5\' 8"  (1.727 m)  Wt 265 lb (120.203 kg)  BMI 40.30 kg/m2  SpO2 98% Physical Exam  Constitutional: She is oriented to person, place, and time. She appears well-developed and well-nourished.  HENT:  Head: Normocephalic and atraumatic.  Cardiovascular: Normal rate and regular rhythm.   No murmur heard. Pulmonary/Chest: Effort normal. No respiratory distress.  Abdominal: Soft. Bowel sounds are normal.  Musculoskeletal: Normal range of motion.  Neurological: She is alert and oriented to person, place, and time. No cranial nerve deficit.  Skin: Skin is warm and dry.  Psychiatric: She has a normal mood and affect. Her speech is normal.  Nursing note and vitals reviewed.   ED Course  Procedures (including critical care time) Labs Review Labs Reviewed - No data to display  Imaging Review No results found.   EKG Interpretation None      MDM   Final diagnoses:  None    Patient with chronic depression and has had auditory hallucinations with some commands over the last few months. Has seen her psychiatrist for it recently. Seen by psychiatry and recommended inpatient treatment. Has been accepted behavioral health.   Jasper Riling. Alvino Chapel, MD 11/04/14 1935

## 2014-11-04 NOTE — ED Notes (Signed)
Pt brought in by [employer].  Pt IVC by Education officer, museum.  Papers states that she appears suicidal.  Hearing commanding female voices.  Visual hallucinations.  Voices telling her to hurt self.  Pt denied to [employer].  Calm and cooperative on transport.

## 2014-11-04 NOTE — ED Notes (Signed)
Writer informed by Vicente Males, TTS counselor that first examination will have to be done in the morning by the psychiatrist therefore patient will not be transferred until tomorrow morning.

## 2014-11-05 ENCOUNTER — Encounter (HOSPITAL_COMMUNITY): Payer: Self-pay

## 2014-11-05 ENCOUNTER — Inpatient Hospital Stay (HOSPITAL_COMMUNITY)
Admission: RE | Admit: 2014-11-05 | Discharge: 2014-11-08 | DRG: 885 | Disposition: A | Discharge status: Home / Self Care | Payer: Medicaid Other | Source: Intra-hospital | Attending: Psychiatry | Admitting: Psychiatry

## 2014-11-05 DIAGNOSIS — F4310 Post-traumatic stress disorder, unspecified: Secondary | ICD-10-CM | POA: Diagnosis present

## 2014-11-05 DIAGNOSIS — J449 Chronic obstructive pulmonary disease, unspecified: Secondary | ICD-10-CM | POA: Diagnosis present

## 2014-11-05 DIAGNOSIS — R45851 Suicidal ideations: Secondary | ICD-10-CM | POA: Diagnosis present

## 2014-11-05 DIAGNOSIS — J45909 Unspecified asthma, uncomplicated: Secondary | ICD-10-CM | POA: Diagnosis present

## 2014-11-05 DIAGNOSIS — Z599 Problem related to housing and economic circumstances, unspecified: Secondary | ICD-10-CM | POA: Diagnosis not present

## 2014-11-05 DIAGNOSIS — Z9884 Bariatric surgery status: Secondary | ICD-10-CM | POA: Diagnosis not present

## 2014-11-05 DIAGNOSIS — F17210 Nicotine dependence, cigarettes, uncomplicated: Secondary | ICD-10-CM | POA: Diagnosis present

## 2014-11-05 DIAGNOSIS — K219 Gastro-esophageal reflux disease without esophagitis: Secondary | ICD-10-CM | POA: Diagnosis present

## 2014-11-05 DIAGNOSIS — F323 Major depressive disorder, single episode, severe with psychotic features: Secondary | ICD-10-CM | POA: Diagnosis not present

## 2014-11-05 DIAGNOSIS — Z833 Family history of diabetes mellitus: Secondary | ICD-10-CM | POA: Diagnosis not present

## 2014-11-05 DIAGNOSIS — G4700 Insomnia, unspecified: Secondary | ICD-10-CM | POA: Diagnosis present

## 2014-11-05 DIAGNOSIS — Z8249 Family history of ischemic heart disease and other diseases of the circulatory system: Secondary | ICD-10-CM | POA: Diagnosis not present

## 2014-11-05 DIAGNOSIS — F419 Anxiety disorder, unspecified: Secondary | ICD-10-CM | POA: Diagnosis present

## 2014-11-05 MED ORDER — ALBUTEROL SULFATE HFA 108 (90 BASE) MCG/ACT IN AERS: 2.0000 | INHALATION_SPRAY | Freq: Four times a day (QID) | RESPIRATORY_TRACT | Status: DC | PRN

## 2014-11-05 MED ORDER — VENLAFAXINE HCL ER 37.5 MG PO CP24
37.5000 mg | ORAL_CAPSULE | Freq: Every day | ORAL | Status: DC
  Administered 2014-11-05 – 2014-11-06 (×2): 37.5 mg via ORAL
  Filled 2014-11-05 (×4): qty 1

## 2014-11-05 MED ORDER — TRAZODONE HCL 50 MG PO TABS: 50.0000 mg | ORAL_TABLET | Freq: Every evening | ORAL | Status: DC | PRN

## 2014-11-05 MED ORDER — ALUM & MAG HYDROXIDE-SIMETH 200-200-20 MG/5ML PO SUSP
30.0000 mL | ORAL | Status: DC | PRN
Start: 2014-11-05 — End: 2014-11-08

## 2014-11-05 MED ORDER — HALOPERIDOL 5 MG PO TABS
5.0000 mg | ORAL_TABLET | ORAL | Status: DC
  Administered 2014-11-05 – 2014-11-07 (×4): 5 mg via ORAL
  Filled 2014-11-05 (×6): qty 1

## 2014-11-05 MED ORDER — BENZTROPINE MESYLATE 0.5 MG PO TABS
0.5000 mg | ORAL_TABLET | ORAL | Status: DC
  Administered 2014-11-05 – 2014-11-07 (×4): 0.5 mg via ORAL
  Filled 2014-11-05 (×6): qty 1

## 2014-11-05 MED ORDER — QUETIAPINE FUMARATE ER 200 MG PO TB24: 200.0000 mg | ORAL_TABLET | Freq: Every day | ORAL | Status: DC

## 2014-11-05 MED ORDER — IBUPROFEN 800 MG PO TABS
800.0000 mg | ORAL_TABLET | Freq: Three times a day (TID) | ORAL | Status: DC | PRN
  Administered 2014-11-05 – 2014-11-07 (×6): 800 mg via ORAL
  Filled 2014-11-05 (×6): qty 1

## 2014-11-05 MED ORDER — HYDROXYZINE HCL 25 MG PO TABS
25.0000 mg | ORAL_TABLET | ORAL | Status: DC | PRN
  Administered 2014-11-05 – 2014-11-07 (×6): 25 mg via ORAL
  Filled 2014-11-05 (×3): qty 1
  Filled 2014-11-05: qty 20
  Filled 2014-11-05 (×3): qty 1

## 2014-11-05 MED ORDER — PANTOPRAZOLE SODIUM 40 MG PO TBEC
40.0000 mg | DELAYED_RELEASE_TABLET | Freq: Every day | ORAL | Status: DC
  Administered 2014-11-05 – 2014-11-08 (×4): 40 mg via ORAL
  Filled 2014-11-05 (×6): qty 1

## 2014-11-05 MED ORDER — TIOTROPIUM BROMIDE MONOHYDRATE 18 MCG IN CAPS
18.0000 ug | ORAL_CAPSULE | Freq: Every day | RESPIRATORY_TRACT | Status: DC
  Administered 2014-11-06 – 2014-11-08 (×2): 18 ug via RESPIRATORY_TRACT
  Filled 2014-11-05: qty 5

## 2014-11-05 MED ORDER — ACETAMINOPHEN 325 MG PO TABS: 650.0000 mg | ORAL_TABLET | Freq: Four times a day (QID) | ORAL | Status: DC | PRN

## 2014-11-05 MED ORDER — MAGNESIUM HYDROXIDE 400 MG/5ML PO SUSP: 30.0000 mL | Freq: Every day | ORAL | Status: DC | PRN

## 2014-11-05 MED ORDER — PRAZOSIN HCL 1 MG PO CAPS
1.0000 mg | ORAL_CAPSULE | Freq: Every day | ORAL | Status: DC
  Administered 2014-11-06 – 2014-11-07 (×2): 1 mg via ORAL
  Filled 2014-11-05 (×3): qty 1
  Filled 2014-11-05 (×2): qty 10
  Filled 2014-11-05: qty 1

## 2014-11-05 MED ORDER — TRAZODONE HCL 100 MG PO TABS
100.0000 mg | ORAL_TABLET | Freq: Every day | ORAL | Status: DC
  Administered 2014-11-05: 100 mg via ORAL
  Filled 2014-11-05 (×2): qty 1

## 2014-11-05 MED ORDER — BUDESONIDE-FORMOTEROL FUMARATE 160-4.5 MCG/ACT IN AERO
2.0000 | INHALATION_SPRAY | Freq: Two times a day (BID) | RESPIRATORY_TRACT | Status: DC
  Administered 2014-11-06 – 2014-11-08 (×2): 2 via RESPIRATORY_TRACT
  Filled 2014-11-05: qty 6

## 2014-11-05 MED ORDER — QUETIAPINE FUMARATE ER 300 MG PO TB24: 600.0000 mg | ORAL_TABLET | Freq: Every day | ORAL | Status: DC

## 2014-11-05 NOTE — Plan of Care (Signed)
Problem: Diagnosis: Increased Risk For Suicide Attempt Goal: STG-Patient Will Report Suicidal Feelings to Staff Outcome: Progressing Pt denied any suicidal ideation on her self-inventory and has no plans here in the hospital and verbally contracts to come to staff if she has any self-harm thoughts.

## 2014-11-05 NOTE — ED Notes (Signed)
Pt transported to [facility] by [employer] for continuation of specialized care. She left in no acute distress.

## 2014-11-05 NOTE — [facility] Suicide Risk Assessment (Signed)
[facility] Admission Suicide Risk Assessment   Nursing information obtained from:    Demographic factors:    Current Mental Status:    Loss Factors:    Historical Factors:    Risk Reduction Factors:    Total Time spent with patient: 30 minutes Principal Problem: PTSD (post-traumatic stress disorder) Diagnosis:   Patient Active Problem List   Diagnosis Date Noted  . 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 PTSD (post-traumatic stress disorder) [F43.10] 11/05/2014  . Depressive disorder [F32.9] 06/04/2014  . Anxiety disorder [F41.9] 06/04/2014  . Major depression, recurrent [F33.9] 03/21/2014  . Suicide attempt [T14.91] 03/21/2014  . Suicidal ideations [R45.851] 03/21/2014  . MDD (major depressive disorder) [F32.2] 03/21/2014  . Nonspecific abnormal unspecified cardiovascular function study [R94.30] 09/30/2013  . Tobacco use disorder [Z72.0] 09/30/2013  . COPD (chronic obstructive pulmonary disease) [J44.9]   . Emphysema [J43.9]   . GERD (gastroesophageal reflux disease) [K21.9]   . Anemia [D64.9]      Continued Clinical Symptoms:  Alcohol Use Disorder Identification Test Final Score (AUDIT): 1 The "Alcohol Use Disorders Identification Test", Guidelines for Use in Primary Care, Second Edition.  World Pharmacologist [facility]). Score between 0-7:  no or low risk or alcohol related problems. Score between 8-15:  moderate risk of alcohol related problems. Score between 16-19:  high risk of alcohol related problems. Score 20 or above:  warrants further diagnostic evaluation for alcohol dependence and treatment.   CLINICAL FACTORS:   Previous Psychiatric Diagnoses and Treatments Medical Diagnoses and Treatments/Surgeries   Musculoskeletal: Strength & Muscle Tone: within normal limits Gait & Station: normal Patient leans: N/A  Psychiatric Specialty Exam: Physical Exam  ROS  Blood pressure 122/75, pulse 78, temperature 98 F  (36.7 C), temperature source Oral, resp. rate 18, height 5\' 8"  (1.727 m), weight 117.028 kg (258 lb).Body mass index is 39.24 kg/(m^2).  Please see H&P.    SUICIDE RISK:   Moderate:  Frequent suicidal ideation with limited intensity, and duration, some specificity in terms of plans, no associated intent, good self-control, limited dysphoria/symptomatology, some risk factors present, and identifiable protective factors, including available and accessible social support.  PLAN OF CARE: Please see H&P.   Medical Decision Making:  Order AIMS Test (2), Established Problem, Worsening (2), Review or order medicine tests (1), Review of Medication Regimen & Side Effects (2) and Review of New Medication or Change in Dosage (2)  I certify that inpatient services furnished can reasonably be expected to improve the patient's condition.   Coralyn Roselli MD 11/05/2014, 2:17 PM

## 2014-11-05 NOTE — Progress Notes (Signed)
Adult Psychoeducational Group Note  Date:  11/05/2014 Time:  10:43 PM  Group Topic/Focus:  Wrap-Up Group:   The focus of this group is to help patients review their daily goal of treatment and discuss progress on daily workbooks.  Participation Level:  Minimal  Participation Quality:  Appropriate  Affect:  Flat  Cognitive:  Oriented  Insight: Appropriate  Engagement in Group:  Engaged  Modes of Intervention:  Socialization and Support  Additional Comments:  Patient attended and participated in group tonight. She reports having a good day. Today she went to her groups and meals. She see her doctor and took her medication.   Salley Scarlet [facility] 11/05/2014, 10:43 PM

## 2014-11-05 NOTE — ED Notes (Signed)
Patient Symbicort inhaler placed in envelope to be transported with patient.
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
## 2014-11-05 NOTE — ED Notes (Signed)
Patient resting quietly in bed with eyes closed. Respirations equal and unlabored., skin warm and dry. NAD noted. Q 15 min safety checks remain in place.

## 2014-11-05 NOTE — ED Notes (Signed)
[facility] communication contacted for patient transport to [facility].

## 2014-11-05 NOTE — BHH Group Notes (Signed)
[place] LCSW Group Therapy  11/05/2014 4:05 PM  Type of Therapy:  Group Therapy  Participation Level:  Minimal  Participation Quality:  Appropriate and Attentive  Affect:  Appropriate  Cognitive:  Alert  Insight:  Limited  Engagement in Therapy:  Limited  Modes of Intervention:  Discussion, Education, Socialization and Support  Summary of Progress/Problems:[employer] ([place]) speaker came to talk about his personal journey with substance abuse and mental illness. Group members were challenged to process ways by which to relate to the speaker. [place] speaker provided handouts and educational information pertaining to groups and services offered by the [facility]. Barbara Stanley attended group and stayed the entire time. She sat quietly and listened to the speaker.     Stanley,Barbara 11/05/2014, 4:05 PM

## 2014-11-05 NOTE — Progress Notes (Signed)
Patient ID: Barbara Stanley, female   DOB: [birth year], 44 y.o.   MRN: [id number]  Admission Note:   D:43 yr female who presents IVC in no acute distress for the treatment of SI and Depression. Pt appears agitated and irritated, and un- cooperative. Pt was calm and cooperative with admission process. Pt denies SI/HI,  Passive AVH- "sometimes". Pt did not want to talk, pt had argumentative tone. Pt was Ivc'd by her psychiatrist due to pt having SI- the means, and being a danger to herself.   A:Skin was assessed and found to be clear of any abnormal marks apart from a scar on abdomen, old cuts to L-arm, tattoo L-arm. [name] POC and unit policies explained and understanding verbalized. Consents obtained. Food and fluids offered, and refused.  R: Pt had no additional questions or concerns.

## 2014-11-05 NOTE — Tx Team (Signed)
Initial Interdisciplinary Treatment Plan   PATIENT STRESSORS: Financial difficulties Health problems Medication change or noncompliance   PATIENT STRENGTHS: General fund of knowledge Supportive family/friends   PROBLEM LIST: Problem List/Patient Goals Date to be addressed Date deferred Reason deferred Estimated date of resolution  "I want the paper to sign me out here" 11/05/14     Risk for suicide 11/05/14     depression 11/05/14     agitation 11/05/14     psychosis 11/05/14                              DISCHARGE CRITERIA:  Improved stabilization in mood, thinking, and/or behavior Medical problems require only outpatient monitoring  PRELIMINARY DISCHARGE PLAN: Attend aftercare/continuing care group Attend PHP/IOP Outpatient therapy  PATIENT/FAMIILY INVOLVEMENT: This treatment plan has been presented to and reviewed with the patient, Barbara Stanley.  The patient and family have been given the opportunity to ask questions and make suggestions.  Vonzella Nipple A 11/05/2014, 6:15 AM

## 2014-11-05 NOTE — BHH Group Notes (Signed)
[facility] LCSW Aftercare Discharge Planning Group Note   11/05/2014 11:09 AM  Participation Quality:  Active   Mood/Affect:  Defensive  Depression Rating:  0  Anxiety Rating:  4  Thoughts of Suicide:  No Will you contract for safety?   NA  Current AVH:  Yes  Plan for Discharge/Comments:  Pt plans to return home and receive outpatient services. Edin stated she does not understand why she was involuntarily committed by her therapist at [place]. Demonstrates a remarkable lack of insight. Later, admits to having a "voice problem," as well as feeling overwhelmed with paying her bills, which was a major stressor.   Transportation Means: Family   Supports: Family   Hyatt,Candace

## 2014-11-05 NOTE — Progress Notes (Signed)
Pt has been up and active in the milieu today. He denied any depression or hopelessness but 5 on his self inventory.  He was hoping to leave today but he handle it well and went back to his room.

## 2014-11-05 NOTE — Tx Team (Signed)
Interdisciplinary Treatment Plan Update (Adult)  Date: 11/05/2014 Time Reviewed: 10:43 AM  Progress in Treatment:  Attending groups: Yes Participating in groups:  Yes Taking medication as prescribed: Yes  Tolerating medication: Yes  Family/Significant othe contact made: Yes Patient understands diagnosis: No  Limited insight  Discussing patient identified problems/goals with staff: Yes  Medical problems stabilized or resolved: Yes  Denies suicidal/homicidal ideation: Yes  Patient has not harmed self or Others: Yes  New problem(s) identified: N/A Discharge Plan or Barriers: Pt plans to return home and receive outpatient services.   Additional comments:Barbara Stanley is an 44 y.o. female. Pt presents to [place] by her therapist Marc Morgans, LCSW at [place]. Per petition "respondent appears to be suicidal". "she has the means and intent to overdose on medication". Pt reports that she presented to her therapist today for a routine therapy appointment. Patient reports that she informed her therapist about her auditory hallucinations. Pt reports that she has been experiencing hallucinations for the past 53months. Pt reports that she has been having worsening Auditory Command Hallucinations for the past 2 months. Pt reports that she ignores the voices. Pt reports that the voices tell her to negative things and put her down. Pt reports that the last episode of command voices telling her to harm herself was 3-4 days ago. Pt reports financial strain as she reports that her rent was due on the 5th of the month and she has been unable to pay. Patient states that her landlord is threatening to take her to court. Pt denies active SI and HI.  Pt and CSW Intern reviewed pt's identified goals and treatment plan. Pt verbalized understanding and agreed to treatment plan  Reason for Continuation of Hospitalization:  Medication stabilization Auditory hallucinations Suicidal ideation    Estimated length of stay: 4-5 days   Attendees:  Patient:  11/05/2014 10:43 AM   Family:  1/20/201610:43 AM   Physician: Dr. Shea Evans MD  1/20/201610:43 AM  Nursing: Para March, RN 11/05/2014 10:43 AM  Clinical Social Worker: Roque Lias, [place]  1/20/201610:43 AM  Clinical Social Worker: Bonnye Fava, [place] Intern 1/20/201610:43 AM  Other:  1/20/201610:43 AM  Other:  1/20/201610:43 AM  Other:  1/20/201610:43 AM  Scribe for Treatment Team:  Bonnye Fava, [place] Intern 11/05/2014 10:43 AM
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
## 2014-11-05 NOTE — BHH Suicide Risk Assessment (Signed)
[place] [facility] INPATIENT:  Family/Significant Other Suicide Prevention Education  Suicide Prevention Education:  Education Completed; Emi Lymon, [phone] has been identified by the patient as the family member/significant other with whom the patient will be residing, and identified as the person(s) who will aid the patient in the event of a mental health crisis (suicidal ideations/suicide attempt).  With written consent from the patient, the family member/significant other has been provided the following suicide prevention education, prior to the and/or following the discharge of the patient.  The suicide prevention education provided includes the following:  Suicide risk factors  Suicide prevention and interventions  National Suicide Hotline telephone number  [facility] assessment telephone number  [facility] Emergency Assistance [place] and/or Residential Mobile Crisis Unit telephone number  Request made of family/significant other to:  Remove weapons (e.g., guns, rifles, knives), all items previously/currently identified as safety concern.    Remove drugs/medications (over-the-counter, prescriptions, illicit drugs), all items previously/currently identified as a safety concern.  The family member/significant other verbalizes understanding of the suicide prevention education information provided.  The family member/significant other agrees to remove the items of safety concern listed above.   Hyatt,Candace 11/05/2014, 3:20 PM

## 2014-11-06 LAB — HEMOGLOBIN A1C
Hgb A1c MFr Bld: 5.3 % (ref <5.7)
MEAN PLASMA GLUCOSE: 105 mg/dL (ref <117)

## 2014-11-06 LAB — LIPID PANEL
CHOL/HDL RATIO: 3.5 ratio
CHOLESTEROL: 156 mg/dL (ref 0–200)
HDL: 44 mg/dL (ref >39)
LDL CALC: 94 mg/dL (ref 0–99)
Triglycerides: 89 mg/dL (ref <150)
VLDL: 18 mg/dL (ref 0–40)

## 2014-11-06 LAB — TSH: TSH: 2.038 u[IU]/mL (ref 0.350–4.500)

## 2014-11-06 MED ORDER — VENLAFAXINE HCL ER 75 MG PO CP24
75.0000 mg | ORAL_CAPSULE | Freq: Every day | ORAL | Status: DC
  Administered 2014-11-07 – 2014-11-08 (×2): 75 mg via ORAL
  Filled 2014-11-06: qty 1
  Filled 2014-11-06 (×2): qty 10
  Filled 2014-11-06 (×3): qty 1

## 2014-11-06 MED ORDER — CAPSAICIN 0.075 % EX CREA
TOPICAL_CREAM | Freq: Two times a day (BID) | CUTANEOUS | Status: DC
  Filled 2014-11-06: qty 60

## 2014-11-06 MED ORDER — CAPSAICIN 0.025 % EX CREA
TOPICAL_CREAM | Freq: Two times a day (BID) | CUTANEOUS | Status: DC
  Administered 2014-11-06: 17:00:00 via TOPICAL
  Filled 2014-11-06: qty 56.6

## 2014-11-06 MED ORDER — TRAZODONE HCL 150 MG PO TABS
150.0000 mg | ORAL_TABLET | Freq: Every day | ORAL | Status: DC
  Administered 2014-11-06 – 2014-11-07 (×2): 150 mg via ORAL
  Filled 2014-11-06: qty 10
  Filled 2014-11-06 (×2): qty 1
  Filled 2014-11-06: qty 10
  Filled 2014-11-06 (×2): qty 1

## 2014-11-06 NOTE — BHH Group Notes (Signed)
[facility] LCSW Group Therapy  11/06/2014 10:23 AM   Type of Therapy:  Group Therapy  Participation Level:  Active  Participation Quality:  Attentive  Affect:  Appropriate  Cognitive:  Appropriate  Insight:  Improving  Engagement in Therapy:  Engaged  Modes of Intervention:  Clarification, Education, Exploration and Socialization  Summary of Progress/Problems: Today's group focused on relapse prevention.  We defined the term, and then brainstormed on ways to prevent relapse.  "Emotion is something that needs to be released so that it doesn't build up.  Fear is one of those for me.  I try to distract myself; sometimes it works, sometimes I am not successful."  "Trust your instincts."  Talked about how that can be difficult.  "My instincts are good. But sometimes its easier for me to ignore them."   Better insight today.  Minimal participation-only when invited directly.  Barbara Stanley 11/06/2014 , 10:23 AM

## 2014-11-06 NOTE — Progress Notes (Signed)
Patient did attend the evening karaoke group. Pt was supportive, engaged, and participated by singing a song.

## 2014-11-06 NOTE — Progress Notes (Signed)
D: Pt presents anxious in affect and mood. Pt verbally endorses anxiety. Pt actively participated within the milieu this evening. Pt was negative for any SI/HI/AVH. Pt was compliant with her plan of care this evening.  A: Writer administered scheduled and prn medications to pt, per MD orders. Continued support and availability as needed was extended to this pt. Staff continue to monitor pt with q2min checks.  R: No adverse drug reactions noted. Pt receptive to treatment. Pt remains safe at this time.

## 2014-11-06 NOTE — Progress Notes (Signed)
Patient ID: Barbara Stanley, female   DOB: 1971-05-10, 44 y.o.   MRN: [id number]  D: Patient pleasant on approach this am. Reports her mood has improved since admission. Gives depression and hopelessness feelings at 0. Anxiety at "3" on scale. Contracts for safety on the unit. Staff reports that she lacks insight into her illness.  A: Staff will monitor on q 15 minute checks, follow treatment plan, and give meds as ordered. R: Cooperative with taking meds and attended nursing group this am.

## 2014-11-06 NOTE — BHH Group Notes (Signed)
[facility] Group Notes:  (Nursing/MHT/Case Management/Adjunct)  Date:  11/06/2014  Time:  9:52 AM  Type of Therapy:  Nurse Education  Participation Level:  Active  Participation Quality:  Appropriate and Attentive  Affect:  Appropriate  Cognitive:  Alert and Appropriate  Insight:  Appropriate  Engagement in Group:  Engaged  Modes of Intervention:  Problem-solving and Support  Summary of Progress/Problems: Answered questions appropriately. Wants discharge  Franciso Bend 11/06/2014, 9:52 AM
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
## 2014-11-07 MED ORDER — HALOPERIDOL 5 MG PO TABS
10.0000 mg | ORAL_TABLET | Freq: Every day | ORAL | Status: DC
  Administered 2014-11-07: 10 mg via ORAL
  Filled 2014-11-07 (×3): qty 2

## 2014-11-07 MED ORDER — BENZTROPINE MESYLATE 0.5 MG PO TABS
0.5000 mg | ORAL_TABLET | Freq: Every day | ORAL | Status: DC
  Administered 2014-11-07: 0.5 mg via ORAL
  Filled 2014-11-07 (×3): qty 1

## 2014-11-07 MED ORDER — BENZTROPINE MESYLATE 1 MG PO TABS
0.5000 mg | ORAL_TABLET | Freq: Every day | ORAL | Status: DC
  Administered 2014-11-08: 0.5 mg via ORAL
  Filled 2014-11-07 (×2): qty 1
  Filled 2014-11-07 (×3): qty 10

## 2014-11-07 MED ORDER — HALOPERIDOL 5 MG PO TABS
5.0000 mg | ORAL_TABLET | Freq: Every day | ORAL | Status: DC
  Administered 2014-11-08: 5 mg via ORAL
  Filled 2014-11-07: qty 30
  Filled 2014-11-07 (×2): qty 1
  Filled 2014-11-07: qty 30

## 2014-11-07 NOTE — Progress Notes (Signed)
[facility] MD Progress Note  11/07/2014 10:08 AM Barbara Stanley  MRN:  [id number] Subjective: Patient states "I feel a little better today, I still hear voices. "   Objective: Patient seen and chart reviewed.Patient discussed with treatment team. Patient with significant past hx of mental illness ,several suicide attempts ,presented after being IVC ed by her therapist.Patient presented with AH of angry voices as well as PTSD sx. Patient with psychosocial stressors like financial problems ,inability to pay rent ,her land lord is taking her to court for the same. Patient today reports some improvement in her anxiety sx. Patient reports having AH still ,but with some improvement. Denies SI.Reports sleep as improved. Denies side effects of medications.      Principal Problem: MDD (major depressive disorder), single episode, severe with psychosis Diagnosis:   DSM5: Primary Psychiatric Diagnosis: MDD ,recurrent severe with psychosis   Secondary Psychiatric Diagnosis: PTSD   Non Psychiatric Diagnosis: SEE PMH   Patient Active Problem List   Diagnosis Date Noted  . 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 PTSD (post-traumatic stress disorder) [F43.10] 11/05/2014  . Depressive disorder [F32.9] 06/04/2014  . Anxiety disorder [F41.9] 06/04/2014  . Major depression, recurrent [F33.9] 03/21/2014  . Suicide attempt [T14.91] 03/21/2014  . Suicidal ideations [R45.851] 03/21/2014  . MDD (major depressive disorder) [F32.2] 03/21/2014  . Nonspecific abnormal unspecified cardiovascular function study [R94.30] 09/30/2013  . Tobacco use disorder [Z72.0] 09/30/2013  . COPD (chronic obstructive pulmonary disease) [J44.9]   . Emphysema [J43.9]   . GERD (gastroesophageal reflux disease) [K21.9]   . Anemia [D64.9]    Total Time spent with patient: 30 minutes   Past Medical History:  Past Medical History  Diagnosis Date  . Depression   . COPD  (chronic obstructive pulmonary disease)   . Emphysema   . GERD (gastroesophageal reflux disease)   . Anemia   . Tobacco abuse     Past Surgical History  Procedure Laterality Date  . C section x 3     . Cholecystectomy    . Gastric bypass    . Abdominal hysterectomy     Family History:  Family History  Problem Relation Age of Onset  . Diabetes Mother   . Diabetes Other   . Hypertension Other   . CAD Other    Social History:  History  Alcohol Use  . Yes    Comment: occas     History  Drug Use No    History   Social History  . Marital Status: Single    Spouse Name: N/A    Number of Children: N/A  . Years of Education: N/A   Social History Main Topics  . Smoking status: Current Every Day Smoker -- 1.00 packs/day for 1 years    Types: Cigarettes  . Smokeless tobacco: None  . Alcohol Use: Yes     Comment: occas  . Drug Use: No  . Sexual Activity: Yes    Birth Control/ Protection: None   Other Topics Concern  . None   Social History Narrative   Additional History:    Sleep: Fair  Appetite:  Fair   Assessment:   Musculoskeletal: Strength & Muscle Tone: within normal limits Gait & Station: normal Patient leans: N/A   Psychiatric Specialty Exam: Physical Exam  ROS  Blood pressure 118/66, pulse 108, temperature 98.2 F (36.8 C), temperature source Oral, resp. rate 20, height 5\' 8"  (1.727 m), weight 117.028 kg (258 lb).Body mass index is  39.24 kg/(m^2).  General Appearance: Fairly Groomed  Engineer, water::  Fair  Speech:  Normal Rate  Volume:  Normal  Mood:  Anxious and Depressed  Affect:  Congruent  Thought Process:  Linear  Orientation:  Full (Time, Place, and Person)  Thought Content:  Hallucinations: Auditory and Rumination  Suicidal Thoughts:  No  Homicidal Thoughts:  No  Memory:  Immediate;   Fair Recent;   Fair Remote;   Fair  Judgement:  Impaired  Insight:  Lacking  Psychomotor Activity:  Normal  Concentration:  Fair  Recall:  [employer] of Knowledge:Fair  Language: Fair  Akathisia:  No  Handed:  Right  AIMS (if indicated):   0  Assets:  Communication Skills Desire for Improvement Physical Health Social Support  ADL's:  Intact  Cognition: WNL  Sleep:  Number of Hours: 6.5     Current Medications: Current Facility-Administered Medications  Medication Dose Route Frequency Provider Last Rate Last Dose  . acetaminophen (TYLENOL) tablet 650 mg  650 mg Oral Q6H PRN Laverle Hobby, PA-C      . albuterol (PROVENTIL HFA;VENTOLIN HFA) 108 (90 BASE) MCG/ACT inhaler 2 puff  2 puff Inhalation Q6H PRN Laverle Hobby, PA-C      . alum & mag hydroxide-simeth (MAALOX/MYLANTA) 200-200-20 MG/5ML suspension 30 mL  30 mL Oral Q4H PRN Laverle Hobby, PA-C      . [START ON 11/08/2014] haloperidol (HALDOL) tablet 5 mg  5 mg Oral Daily Ursula Alert, MD       And  . Derrill Memo ON 11/08/2014] benztropine (COGENTIN) tablet 0.5 mg  0.5 mg Oral Daily Amariyon Maynes, MD      . haloperidol (HALDOL) tablet 10 mg  10 mg Oral QHS Traeton Bordas, MD       And  . benztropine (COGENTIN) tablet 0.5 mg  0.5 mg Oral QHS Deakon Frix, MD      . budesonide-formoterol (SYMBICORT) 160-4.5 MCG/ACT inhaler 2 puff  2 puff Inhalation BID Laverle Hobby, PA-C   2 puff at 11/06/14 0758  . capsaicin (ZOSTRIX) 0.025 % cream   Topical BID Ursula Alert, MD      . hydrOXYzine (ATARAX/VISTARIL) tablet 25 mg  25 mg Oral Q4H PRN Laverle Hobby, PA-C   25 mg at 11/06/14 1502  . ibuprofen (ADVIL,MOTRIN) tablet 800 mg  800 mg Oral Q8H PRN Laverle Hobby, PA-C   800 mg at 11/07/14 4496  . magnesium hydroxide (MILK OF MAGNESIA) suspension 30 mL  30 mL Oral Daily PRN Laverle Hobby, PA-C      . pantoprazole (PROTONIX) EC tablet 40 mg  40 mg Oral Daily Laverle Hobby, PA-C   40 mg at 11/07/14 0837  . prazosin (MINIPRESS) capsule 1 mg  1 mg Oral QHS Ursula Alert, MD   1 mg at 11/06/14 2201  . tiotropium (SPIRIVA) inhalation capsule 18 mcg  18 mcg Inhalation Daily  Laverle Hobby, PA-C   18 mcg at 11/06/14 0758  . traZODone (DESYREL) tablet 150 mg  150 mg Oral QHS Ursula Alert, MD   150 mg at 11/06/14 2201  . venlafaxine XR (EFFEXOR-XR) 24 hr capsule 75 mg  75 mg Oral Q breakfast Ursula Alert, MD   75 mg at 11/07/14 7591    Lab Results:  Results for orders placed or performed during the hospital encounter of 11/05/14 (from the past 48 hour(s))  Lipid panel     Status: None   Collection Time: 11/06/14  7:00 AM  Result Value Ref Range   Cholesterol 156 0 - 200 mg/dL   Triglycerides 89 <150 mg/dL   HDL 44 >39 mg/dL   Total CHOL/HDL Ratio 3.5 RATIO   VLDL 18 0 - 40 mg/dL   LDL Cholesterol 94 0 - 99 mg/dL    Comment:        Total Cholesterol/HDL:CHD Risk Coronary Heart Disease Risk Table                     Men   Women  1/2 Average Risk   3.4   3.3  Average Risk       5.0   4.4  2 X Average Risk   9.6   7.1  3 X Average Risk  23.4   11.0        Use the calculated Patient Ratio above and the CHD Risk Table to determine the patient's CHD Risk.        ATP III CLASSIFICATION (LDL):  <100     mg/dL   Optimal  100-129  mg/dL   Near or Above                    Optimal  130-159  mg/dL   Borderline  160-189  mg/dL   High  >190     mg/dL   Very High Performed at [facility]   Hemoglobin A1c     Status: None   Collection Time: 11/06/14  7:00 AM  Result Value Ref Range   Hgb A1c MFr Bld 5.3 <5.7 %    Comment: (NOTE)                                                                       According to the ADA Clinical Practice Recommendations for 2011, when HbA1c is used as a screening test:  >=6.5%   Diagnostic of Diabetes Mellitus           (if abnormal result is confirmed) 5.7-6.4%   Increased risk of developing Diabetes Mellitus References:Diagnosis and Classification of Diabetes Mellitus,Diabetes HERD,4081,44(YJEHU 1):S62-S69 and Standards of Medical Care in         Diabetes - 2011,Diabetes Care,2011,34 (Suppl 1):S11-S61.     Mean Plasma Glucose 105 <117 mg/dL    Comment: Performed at [employer] [facility]  TSH     Status: None   Collection Time: 11/06/14  7:00 AM  Result Value Ref Range   TSH 2.038 0.350 - 4.500 uIU/mL    Comment: Performed at [facility]    Physical Findings: AIMS: Facial and Oral Movements Muscles of Facial Expression: None, normal Lips and Perioral Area: None, normal Jaw: None, normal Tongue: None, normal,Extremity Movements Upper (arms, wrists, hands, fingers): None, normal Lower (legs, knees, ankles, toes): None, normal, Trunk Movements Neck, shoulders, hips: None, normal, Overall Severity Severity of abnormal movements (highest score from questions above): None, normal Incapacitation due to abnormal movements: None, normal Patient's awareness of abnormal movements (rate only patient's report): No Awareness, Dental Status Current problems with teeth and/or dentures?: No Does patient usually wear dentures?: No  CIWA:  CIWA-Ar Total: 0 COWS:  COWS Total Score: 0  Treatment Plan Summary: Daily contact with patient to  assess and evaluate symptoms and progress in treatment and Medication management   Will increase Effexor XR to 75 mg po daily. Will increase Haldol to 15 mg po daily for psychosis. Start Cogentin 0.5 mg po bid for EPS.AIMS - 0 (11/05/14) Will continue Trazodone 150 mg po qhs for sleep.Will continue Prazosin 1 mg po qhs for nightmares.  Patient to attend group activities. Patient to be referred for Intensive psychotherapy once discharged. Patient has a therapist at [facility].     Medical Decision Making:  Established Problem, Stable/Improving (1), Review of Last Therapy Session (1), Review of Medication Regimen & Side Effects (2) and Review of New Medication or Change in Dosage (2) Problem Points:  Established problem, stable/improving (1), New problem, with no additional work-up planned (3), Review of last therapy session (1) and Review of psycho-social  stressors (1) Data Points:  Order Aims Assessment (2) Review of medication regiment & side effects (2) Review of new medications or change in dosage (2)    Cendy Oconnor MD 11/07/2014, 10:08 AM

## 2014-11-07 NOTE — Progress Notes (Signed)
  [facility] Adult Case Management Discharge Plan :  Will you be returning to the same living situation after discharge:  Yes,  home At discharge, do you have transportation home?: Yes,  [place] Do you have the ability to pay for your medications: Yes,  MCD  Release of information consent forms completed and in the chart;  Patient's signature needed at discharge.  Patient to Follow up at: Follow-up Information    Follow up with [facility].   Why:  Go to your next scheduled medication management appointment   Contact information:   [place] [phone]      Follow up with [place] On 11/11/2014.   Why:  Tuesday at 1:00 with your therapist   Contact information:   [place] [phone]      Patient denies SI/HI: Yes,  yes    Safety Planning and Suicide Prevention discussed: Yes,  yes  Has patient been referred to the Quitline?: Patient refused referral  Trish Mage 11/07/2014, 1:29 PM

## 2014-11-07 NOTE — Tx Team (Signed)
  Interdisciplinary Treatment Plan Update   Date Reviewed:  11/07/2014  Time Reviewed:  1:20 PM  Progress in Treatment:   Attending groups: Yes Participating in groups: Yes Taking medication as prescribed: Yes  Tolerating medication: Yes Family/Significant other contact made: Yes  Patient understands diagnosis: Yes  Discussing patient identified problems/goals with staff: Yes  See initial care plan Medical problems stabilized or resolved: Yes Denies suicidal/homicidal ideation: Yes  In tx team Patient has not harmed self or others: Yes  For review of initial/current patient goals, please see plan of care.  Estimated Length of Stay:  Likely d/c tomorrow  Reason for Continuation of Hospitalization:   New Problems/Goals identified:  N/A  Discharge Plan or Barriers:  return home, follow up outpt   Additional Comments:  Attendees:  Signature: Steva Colder, MD 11/07/2014 1:20 PM   Signature: Ripley Fraise, LCSW 11/07/2014 1:20 PM  Signature:  11/07/2014 1:20 PM  Signature: Drake Leach, RN 11/07/2014 1:20 PM  Signature:  11/07/2014 1:20 PM  Signature:  11/07/2014 1:20 PM  Signature:   11/07/2014 1:20 PM  Signature:    Signature:    Signature:    Signature:    Signature:    Signature:      Scribe for Treatment Team:   Ripley Fraise, LCSW  11/07/2014 1:20 PM

## 2014-11-07 NOTE — Progress Notes (Signed)
D: Pt's affect brightens upon approach. Pt was present and active within the milieu. Pt denied any SI/HI/VH. Pt reports a decrease of intensity of her AH. Pt was happily to report that she did not feel the need for any prn anxiety medications tonight. Pt had no concerns she wished for this writer to address at this time. A: Writer administered scheduled and prn medications to pt, per MD orders. Continued support and availability as needed was extended to this pt. Staff continue to monitor pt with q78min checks.  R: No adverse drug reactions noted. Pt receptive to treatment. Pt remains safe at this time.

## 2014-11-07 NOTE — Progress Notes (Addendum)
Barbara Stanley is seen OOB UAL on the 400 hall today...she handles this well. She makes good eye contact . She is pleasant and she cooperates.   A She completed her morning assessment sheet and on it she wrote she denied SI  Within the past 24 hrs and she rated her depression, hopelessness and anxiety "0/0/4" when asked to rate them specifically. She requested and was given ibuprofen for c/o pain in her back ( chronic)  And stated she received relief 1 hour  afterwards.    R DC planning includes discussing with pt potential for tomorrow. Pt in agreement and states she understands. Safety is in place.                     Addendum: 1430 Barbara Pt presented to this nurse and requested " my anxiety medicine" . She states she has anxiety " all of the time". She is given 25 mg po for vistaril per MD order. Pt resting in the dayroom afterwards. Safety in place.

## 2014-11-08 ENCOUNTER — Encounter (HOSPITAL_COMMUNITY): Payer: Self-pay | Admitting: Registered Nurse

## 2014-11-08 MED ORDER — HALOPERIDOL 10 MG PO TABS: 10.0000 mg | ORAL_TABLET | Freq: Every day | ORAL | Status: DC

## 2014-11-08 MED ORDER — HALOPERIDOL 5 MG PO TABS: 5.0000 mg | ORAL_TABLET | Freq: Every day | ORAL | Status: DC

## 2014-11-08 MED ORDER — PRAZOSIN HCL 1 MG PO CAPS: 1.0000 mg | ORAL_CAPSULE | Freq: Every day | ORAL | Status: DC

## 2014-11-08 MED ORDER — TRAZODONE HCL 150 MG PO TABS: 150.0000 mg | ORAL_TABLET | Freq: Every day | ORAL | Status: AC

## 2014-11-08 MED ORDER — CAPSAICIN 0.025 % EX CREA: TOPICAL_CREAM | Freq: Two times a day (BID) | CUTANEOUS | Status: DC

## 2014-11-08 MED ORDER — BENZTROPINE MESYLATE 0.5 MG PO TABS: 0.5000 mg | ORAL_TABLET | Freq: Every day | ORAL | Status: DC

## 2014-11-08 MED ORDER — HYDROXYZINE HCL 25 MG PO TABS: 25.0000 mg | ORAL_TABLET | ORAL | Status: DC | PRN

## 2014-11-08 MED ORDER — VENLAFAXINE HCL ER 75 MG PO CP24: 75.0000 mg | ORAL_CAPSULE | Freq: Every day | ORAL | Status: DC

## 2014-11-08 NOTE — Progress Notes (Signed)
D) Pt being discharged to home accompanied by her family. Affect and mood are appropriate. Pt denies SI and HI. Rates her depression and hopelessness at a 0 and her anxiety at a 4. Pt has been pleasant attending the program and interacting with her peers appropriately.  A) given support and reassurance along with praise. Encouragement given. Provided with a 1:1. All Pt's medications explained to her along with explaining her follow up plans. All Pt's belongings returned to her. R) Pt denies SI and HI delusions and hallucinations.

## 2014-11-08 NOTE — Progress Notes (Signed)
D: Patient alert and cooperative. Pt c/o of anxiety stating "it been creeping up all day". Pt reports she is discharging tomorrow and will follow up outpatient. Pt mood/affect is anxious. Pt denies SI/HI/AVH.    A: Medications administered as prescribed. Emotional support given and will continue to monitor pt's progress.  R: Patient remains safe and complaint with medications.

## 2014-11-08 NOTE — Progress Notes (Signed)
.  Psychoeducational Group Note    Date: 11/08/2014 Time: 1000    Goal Setting Purpose of Group: To be able to set a goal that is measurable and that can be accomplished in one day Participation Level:  Active  Participation Quality:  Appropriate  Affect:  Flat and Lethargic  Cognitive:  organized  Insight: appropriate  Engagement in Group:  engaged  Additional Comments:  Pt attended the group, paid attention and did provide feedback for a goal. very engaged in the group.  Paulino Rily

## 2014-11-08 NOTE — [facility] Suicide Risk Assessment (Signed)
[facility] Discharge Suicide Risk Assessment   Demographic Factors:  Caucasian and Unemployed  Total Time spent with patient: 30 minutes  Musculoskeletal: Strength & Muscle Tone: within normal limits Gait & Station: normal Patient leans: N/A  Psychiatric Specialty Exam: Physical Exam  ROS  Blood pressure 128/72, pulse 91, temperature 98.2 F (36.8 C), temperature source Oral, resp. rate 20, height 5\' 8"  (1.727 m), weight 117.028 kg (258 lb).Body mass index is 39.24 kg/(m^2).  General Appearance: Casual  Eye Contact::  Good  Speech:  Normal Rate409  Volume:  Normal  Mood:  Anxious  Affect:  Congruent  Thought Process:  Circumstantial  Orientation:  Full (Time, Place, and Person)  Thought Content:  WDL  Suicidal Thoughts:  No  Homicidal Thoughts:  No  Memory:  Immediate;   Good Recent;   Good Remote;   Good  Judgement:  Good  Insight:  Good  Psychomotor Activity:  Normal  Concentration:  Good  Recall:  Good  Fund of Knowledge:Good  Language: Fair  Akathisia:  No  Handed:  Right  AIMS (if indicated):     Assets:  Communication Skills Desire for Improvement Housing Resilience Social Support Transportation  Sleep:  Number of Hours: 6.5  Cognition: WNL  ADL's:  Intact   Have you used any form of tobacco in the last 30 days? (Cigarettes, Smokeless Tobacco, Cigars, and/or Pipes): Yes  Has this patient used any form of tobacco in the last 30 days? (Cigarettes, Smokeless Tobacco, Cigars, and/or Pipes) Yes, Prescription not provided because: Patient refused  Mental Status Per Nursing Assessment::   On Admission:     Current Mental Status by Physician: See above  Loss Factors: Decrease in vocational status  Historical Factors: Prior suicide attempts and Impulsivity  Risk Reduction Factors:   Responsible for children under 96 years of age, Sense of responsibility to family, Living with another person, especially a relative, Positive social support, Positive therapeutic  relationship and Positive coping skills or problem solving skills  Continued Clinical Symptoms:  Depression:   Insomnia  Cognitive Features That Contribute To Risk:  None    Suicide Risk:  Minimal: No identifiable suicidal ideation.  Patients presenting with no risk factors but with morbid ruminations; may be classified as minimal risk based on the severity of the depressive symptoms  Principal Problem: MDD (major depressive disorder), single episode, severe with psychosis Discharge Diagnoses:  Patient Active Problem List   Diagnosis Date Noted  . 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 PTSD (post-traumatic stress disorder) [F43.10] 11/05/2014  . Depressive disorder [F32.9] 06/04/2014  . Anxiety disorder [F41.9] 06/04/2014  . Major depression, recurrent [F33.9] 03/21/2014  . Suicide attempt [T14.91] 03/21/2014  . Suicidal ideations [R45.851] 03/21/2014  . MDD (major depressive disorder) [F32.2] 03/21/2014  . Nonspecific abnormal unspecified cardiovascular function study [R94.30] 09/30/2013  . Tobacco use disorder [Z72.0] 09/30/2013  . COPD (chronic obstructive pulmonary disease) [J44.9]   . Emphysema [J43.9]   . GERD (gastroesophageal reflux disease) [K21.9]   . Anemia [D64.9]     Follow-up Information    Follow up with [facility].   Why:  Go to your next scheduled medication management appointment   Contact information:   [place] [phone]      Follow up with [facility] On 11/11/2014.   Why:  Tuesday at 1:00 with your therapist   Contact information:   [place]  [  [place] [facility]/Follow-up recommendations:  Activity:  As tolerated Diet:  Unchanged from the past Other:  Patient will follow-up at [facility]  Is patient on multiple antipsychotic therapies at discharge:  No   Has Patient had three or more failed trials of antipsychotic  monotherapy by history:  No  Recommended Plan for Multiple Antipsychotic Therapies: NA    Barbara Stanley T. 11/08/2014, 9:42 AM

## 2014-11-08 NOTE — [facility] Group Notes (Signed)
[place] LCSW Group Therapy Note  11/08/2014 / 10:30 AM  Type of Therapy and Topic:  Group Therapy: Avoiding Self-Sabotaging and Enabling Behaviors  Participation Level:  Active  Therapeutic Goals: 1. Patient will identify one obstacle that relates to self-sabotage and enabling behaviors 2. Patient will identify one personal self-sabotaging or enabling behavior they did prior to admission 3. Patient able to establish a plan to change the above identified behavior they did prior to admission:  4. Patient will demonstrate ability to communicate their needs through discussion and/or role plays.   Summary of Patient Progress: The main focus of today's process group was to discuss what "self-sabotage" means and use Motivational Interviewing to discuss what benefits, negative or positive, were involved in a self-identified self-sabotaging behavior. We then talked about reasons the patient may want to change the behavior and current desire to change. The patient identified with self sabotaging in the form of isolation and procrastination. She was engaged in group process as evidenced by her shares and tracking of group discussion.Patient shared that medication stabilization has been most helpful while inpatient and her plan to advocate on her own behalf upon discharge.     Therapeutic Modalities:   Cognitive Behavioral Therapy Person-Centered Therapy Motivational Interviewing   Sheilah Pigeon, LCSW

## 2014-11-08 NOTE — Plan of Care (Signed)
Problem: Diagnosis: Increased Risk For Suicide Attempt Goal: STG-Patient Will Comply With Medication Regime Outcome: Progressing Pt is safe and compliant with schedule medication regime

## 2014-11-08 NOTE — Discharge Summary (Signed)
Physician Discharge Summary Note  Patient:  Barbara Stanley is an 44 y.o., female MRN:  [id number] DOB:  24-Mar-1971 Patient phone:  [phone] (home)  Patient address:   [place] [address],  Total Time spent with patient: Greater than 30 minutes  Date of Admission:  11/05/2014 Date of Discharge: 11/08/2014  Reason for Admission:  Barbara Stanley is an 44 y.o. female. Pt presented to [facility] by her therapist Barbara Morgans, LCSW at [facility]. Per petition "respondent appears to be suicidal". "she has the means and intent to overdose on medication". Pt during initial evaluation in the ED reported that she presented to her therapist for a routine therapy appointment. Patient reported that she informed her therapist about her auditory hallucinations. Pt reported that she has been experiencing hallucinations for the past 4 months.Pt reports that the voices tell her to negative things and put her down. Pt reports that the last episode of command voices telling her to harm herself was 3-4 days ago. Pt reports financial strain as she reports that her rent was due on the 5th of the month and she has been unable to pay. Patient states that her landlord is threatening to take her to court. Pt denies active SI and HI.   Principal Problem: MDD (major depressive disorder), single episode, severe with psychosis Discharge Diagnoses: Patient Active Problem List   Diagnosis Date Noted  . 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 PTSD (post-traumatic stress disorder) [F43.10] 11/05/2014  . Depressive disorder [F32.9] 06/04/2014  . Anxiety disorder [F41.9] 06/04/2014  . Major depression, recurrent [F33.9] 03/21/2014  . Suicide attempt [T14.91] 03/21/2014  . Suicidal ideations [R45.851] 03/21/2014  . MDD (major depressive disorder) [F32.2] 03/21/2014  . Nonspecific abnormal unspecified cardiovascular function study [R94.30]  09/30/2013  . Tobacco use disorder [Z72.0] 09/30/2013  . COPD (chronic obstructive pulmonary disease) [J44.9]   . Emphysema [J43.9]   . GERD (gastroesophageal reflux disease) [K21.9]   . Anemia [D64.9]     Musculoskeletal: Strength & Muscle Tone: within normal limits Gait & Station: normal Patient leans: N/A  Psychiatric Specialty Exam:  See Suicide Risk Assessment Physical Exam  Review of Systems  Psychiatric/Behavioral: Negative for suicidal ideas and memory loss. Depression: Stable. Hallucinations: Stable. Nervous/anxious: Stable. Insomnia: Stable.   All other systems reviewed and are negative.   Blood pressure 128/72, pulse 91, temperature 98.2 F (36.8 C), temperature source Oral, resp. rate 20, height 5\' 8"  (1.727 m), weight 117.028 kg (258 lb).Body mass index is 39.24 kg/(m^2).   Past Medical History:  Past Medical History  Diagnosis Date  . Depression   . COPD (chronic obstructive pulmonary disease)   . Emphysema   . GERD (gastroesophageal reflux disease)   . Anemia   . Tobacco abuse     Past Surgical History  Procedure Laterality Date  . C section x 3     . Cholecystectomy    . Gastric bypass    . Abdominal hysterectomy     Family History:  Family History  Problem Relation Age of Onset  . Diabetes Mother   . Diabetes Other   . Hypertension Other   . CAD Other    Social History:  History  Alcohol Use  . Yes    Comment: occas     History  Drug Use No    History   Social History  . Marital Status: Single    Spouse Name: N/A  Number of Children: N/A  . Years of Education: N/A   Social History Main Topics  . Smoking status: Current Every Day Smoker -- 1.00 packs/day for 1 years    Types: Cigarettes  . Smokeless tobacco: None  . Alcohol Use: Yes     Comment: occas  . Drug Use: No  . Sexual Activity: Yes    Birth Control/ Protection: None   Other Topics Concern  . None   Social History Narrative    Past Psychiatric  History: Hospitalizations:  Outpatient Care:  Substance Abuse Care:  Self-Mutilation:  Suicidal Attempts:  Violent Behaviors:   Risk to Self: Is patient at risk for suicide?: Yes Risk to Others:   Prior Inpatient Therapy:   Prior Outpatient Therapy:    Level of Care:  OP  Hospital Course:    Barbara Stanley was admitted for Major Depressive Disorder, single episode, severe with psychosis and crisis management.  She was treated with Cogentin for extrapyramidal symptoms, Haloperidol for psychosis,  Vistaril for anxiety, Prazosin for nightmares, Trazodone for insomnia and Effexor XR for major depression.  Medical problems were identified and treated as needed.  Home medications were restarted as appropriate.  Improvement was monitored by observation and Barbara Stanley daily report of symptom reduction.  Emotional and mental status was monitored by daily self inventory reports completed by Barbara Stanley and clinical staff.         Barbara Stanley was evaluated by the treatment team for stability and plans for continued recovery upon discharge.  She was offered further treatment options upon discharge including Residential, Intensive Outpatient and Outpatient treatment.  She will follow up with Barbara Stanley (Keeping her next scheduled appointment) for medication management and [facility] for therapy.       Barbara Stanley motivation was an integral factor for scheduling further treatment.  Employment, transportation, bed availability, health status, family support, and any pending legal issues were also considered during her hospital stay.  Upon completion of this admission the patient was both mentally and medically stable for discharge denying suicidal/homicidal ideation, auditory/visual/tactile hallucinations, delusional thoughts and paranoia.       Consults:  psychiatry  Significant Diagnostic Studies:  labs: CBC/Diff, CMET, UDS, ETOH, Urinalysis  Discharge Vitals:   Blood  pressure 128/72, pulse 91, temperature 98.2 F (36.8 C), temperature source Oral, resp. rate 20, height 5\' 8"  (1.727 m), weight 117.028 kg (258 lb). Body mass index is 39.24 kg/(m^2). Lab Results:   Results for orders placed or performed during the hospital encounter of 11/05/14 (from the past 72 hour(s))  Lipid panel     Status: None   Collection Time: 11/06/14  7:00 AM  Result Value Ref Range   Cholesterol 156 0 - 200 mg/dL   Triglycerides 89 <150 mg/dL   HDL 44 >39 mg/dL   Total CHOL/HDL Ratio 3.5 RATIO   VLDL 18 0 - 40 mg/dL   LDL Cholesterol 94 0 - 99 mg/dL    Comment:        Total Cholesterol/HDL:CHD Risk Coronary Heart Disease Risk Table                     Men   Women  1/2 Average Risk   3.4   3.3  Average Risk       5.0   4.4  2 X Average Risk   9.6   7.1  3 X Average Risk  23.4   11.0  Use the calculated Patient Ratio above and the CHD Risk Table to determine the patient's CHD Risk.        ATP III CLASSIFICATION (LDL):  <100     mg/dL   Optimal  100-129  mg/dL   Near or Above                    Optimal  130-159  mg/dL   Borderline  160-189  mg/dL   High  >190     mg/dL   Very High Performed at [facility]   Hemoglobin A1c     Status: None   Collection Time: 11/06/14  7:00 AM  Result Value Ref Range   Hgb A1c MFr Bld 5.3 <5.7 %    Comment: (NOTE)                                                                       According to the ADA Clinical Practice Recommendations for 2011, when HbA1c is used as a screening test:  >=6.5%   Diagnostic of Diabetes Mellitus           (if abnormal result is confirmed) 5.7-6.4%   Increased risk of developing Diabetes Mellitus References:Diagnosis and Classification of Diabetes Mellitus,Diabetes KGYJ,8563,14(HFWYO 1):S62-S69 and Standards of Medical Care in         Diabetes - 2011,Diabetes Care,2011,34 (Suppl 1):S11-S61.    Mean Plasma Glucose 105 <117 mg/dL    Comment: Performed at [facility]  TSH      Status: None   Collection Time: 11/06/14  7:00 AM  Result Value Ref Range   TSH 2.038 0.350 - 4.500 uIU/mL    Comment: Performed at [facility]    Physical Findings: AIMS: Facial and Oral Movements Muscles of Facial Expression: None, normal Lips and Perioral Area: None, normal Jaw: None, normal Tongue: None, normal,Extremity Movements Upper (arms, wrists, hands, fingers): None, normal Lower (legs, knees, ankles, toes): None, normal, Trunk Movements Neck, shoulders, hips: None, normal, Overall Severity Severity of abnormal movements (highest score from questions above): None, normal Incapacitation due to abnormal movements: None, normal Patient's awareness of abnormal movements (rate only patient's report): No Awareness, Dental Status Current problems with teeth and/or dentures?: No Does patient usually wear dentures?: No  CIWA:  CIWA-Ar Total: 0 COWS:  COWS Total Score: 0   See Psychiatric Specialty Exam and Suicide Risk Assessment completed by Attending Physician prior to discharge.  Discharge destination:  Home  Is patient on multiple antipsychotic therapies at discharge:  No   Has Patient had three or more failed trials of antipsychotic monotherapy by history:  No    Recommended Plan for Multiple Antipsychotic Therapies: NA      Discharge Instructions    Discharge instructions    Complete by:  As directed   Take all of you medications as prescribed by your mental healthcare provider.  Report any adverse effects and reactions from your medications to your outpatient provider promptly. Do not engage in alcohol and or illegal drug use while on prescription medicines. In the event of worsening symptoms call the crisis hotline, 911, and or go to the nearest emergency department for appropriate evaluation and treatment of symptoms. Follow-up with your primary care  provider for your medical issues, concerns and or health care needs.   Keep all scheduled  appointments.  If you are unable to keep an appointment call to reschedule.  Let the nurse know if you will need medications before next scheduled appointment.            Medication List    STOP taking these medications        QUEtiapine 300 MG 24 hr tablet  Commonly known as:  SEROQUEL XR      TAKE these medications      Indication   benztropine 0.5 MG tablet  Commonly known as:  COGENTIN  Take 1 tablet (0.5 mg total) by mouth daily. For drug induced tremors   Indication:  Extrapyramidal Reaction caused by Medications     budesonide-formoterol 160-4.5 MCG/ACT inhaler  Commonly known as:  SYMBICORT  Inhale 2 puffs into the lungs 2 (two) times daily.   Indication:  shortness of breath     capsaicin 0.025 % cream  Commonly known as:  ZOSTRIX  Apply topically 2 (two) times daily. Apply to affected area      esomeprazole 40 MG capsule  Commonly known as:  NEXIUM  Take 1 capsule (40 mg total) by mouth daily at 12 noon.   Indication:  GERD     haloperidol 5 MG tablet  Commonly known as:  HALDOL  Take 1 tablet (5 mg total) by mouth daily. For psychosis      hydrOXYzine 25 MG tablet  Commonly known as:  ATARAX/VISTARIL  Take 1 tablet (25 mg total) by mouth every 4 (four) hours as needed for anxiety.   Indication:  anxiety     ibuprofen 800 MG tablet  Commonly known as:  ADVIL,MOTRIN  Take 800 mg by mouth every 8 (eight) hours as needed for moderate pain.      prazosin 1 MG capsule  Commonly known as:  MINIPRESS  Take 1 capsule (1 mg total) by mouth at bedtime. For nightmares   Indication:  nightmares     PROAIR HFA 108 (90 BASE) MCG/ACT inhaler  Generic drug:  albuterol  Inhale 2 puffs into the lungs every 6 (six) hours as needed for wheezing or shortness of breath.      tiotropium 18 MCG inhalation capsule  Commonly known as:  SPIRIVA  Place 1 capsule (18 mcg total) into inhaler and inhale daily.   Indication:  asthma     traZODone 150 MG tablet  Commonly known  as:  DESYREL  Take 1 tablet (150 mg total) by mouth at bedtime. For sleep   Indication:  Trouble Sleeping     valACYclovir 1000 MG tablet  Commonly known as:  VALTREX  Take 2 tablets (2,000 mg total) by mouth 2 (two) times daily.      venlafaxine XR 75 MG 24 hr capsule  Commonly known as:  EFFEXOR-XR  Take 1 capsule (75 mg total) by mouth daily with breakfast. For depression   Indication:  Major Depressive Disorder       Follow-up Information    Follow up with [facility].   Why:  Go to your next scheduled medication management appointment   Contact information:   [facility] [phone]      Follow up with [facility] On 11/11/2014.   Why:  Tuesday at 1:00 with your therapist   Contact information:   [facility] [phone]  Follow-up recommendations:  Activity:  As tolerated Diet:  As tolerated  Comments:   Patient has been instructed to take medications as prescribed; and report adverse effects to outpatient provider.  Follow up with primary doctor for any medical issues and If symptoms recur report to nearest emergency or crisis hot line.    Total Discharge Time: Greater than 30 minutes  Signed: Earleen Newport, FNP-BC 11/08/2014, 9:43 AM   I have personally seen the patient and agreed with the findings and involved in the treatment plan. Berniece Andreas, MD

## 2014-11-12 NOTE — Progress Notes (Signed)
Patient Discharge Instructions:  After Visit Summary (AVS):   Faxed to:  11/12/14 Discharge Summary Note:   Faxed to:  11/12/14 Psychiatric Admission Assessment Note:   Faxed to:  11/12/14 Suicide Risk Assessment - Discharge Assessment:   Faxed to:  11/12/14 Faxed/Sent to the Next Level Care provider:  11/12/14 Faxed to [facility] @ [phone] Faxed to [facility] @ [phone]  Patsey Berthold, 11/12/2014, 3:31 PM

## 2015-01-13 DIAGNOSIS — J449 Chronic obstructive pulmonary disease, unspecified: Secondary | ICD-10-CM | POA: Insufficient documentation

## 2016-11-28 DIAGNOSIS — M129 Arthropathy, unspecified: Secondary | ICD-10-CM | POA: Insufficient documentation

## 2017-08-07 ENCOUNTER — Ambulatory Visit (HOSPITAL_COMMUNITY)
Admission: RE | Admit: 2017-08-07 | Discharge: 2017-08-07 | Disposition: A | Discharge status: Home / Self Care | Payer: Medicaid Other | Source: Ambulatory Visit | Attending: Family Medicine | Admitting: Family Medicine

## 2017-08-07 ENCOUNTER — Encounter: Payer: Self-pay | Admitting: Family Medicine

## 2017-08-07 ENCOUNTER — Ambulatory Visit (INDEPENDENT_AMBULATORY_CARE_PROVIDER_SITE_OTHER): Payer: Medicaid Other | Admitting: Family Medicine

## 2017-08-07 VITALS — BP 95/68 | HR 97 | Temp 97.8°F | Resp 16 | Ht 68.0 in | Wt 238.0 lb

## 2017-08-07 DIAGNOSIS — Z1239 Encounter for other screening for malignant neoplasm of breast: Secondary | ICD-10-CM

## 2017-08-07 DIAGNOSIS — D509 Iron deficiency anemia, unspecified: Secondary | ICD-10-CM

## 2017-08-07 DIAGNOSIS — Z9884 Bariatric surgery status: Secondary | ICD-10-CM | POA: Diagnosis not present

## 2017-08-07 DIAGNOSIS — F330 Major depressive disorder, recurrent, mild: Secondary | ICD-10-CM

## 2017-08-07 DIAGNOSIS — R05 Cough: Secondary | ICD-10-CM | POA: Diagnosis present

## 2017-08-07 DIAGNOSIS — Z23 Encounter for immunization: Secondary | ICD-10-CM | POA: Diagnosis not present

## 2017-08-07 DIAGNOSIS — R0602 Shortness of breath: Secondary | ICD-10-CM | POA: Diagnosis present

## 2017-08-07 DIAGNOSIS — Z1231 Encounter for screening mammogram for malignant neoplasm of breast: Secondary | ICD-10-CM

## 2017-08-07 DIAGNOSIS — K529 Noninfective gastroenteritis and colitis, unspecified: Secondary | ICD-10-CM

## 2017-08-07 DIAGNOSIS — J449 Chronic obstructive pulmonary disease, unspecified: Secondary | ICD-10-CM | POA: Insufficient documentation

## 2017-08-07 DIAGNOSIS — K219 Gastro-esophageal reflux disease without esophagitis: Secondary | ICD-10-CM | POA: Diagnosis not present

## 2017-08-07 DIAGNOSIS — Z13220 Encounter for screening for lipoid disorders: Secondary | ICD-10-CM | POA: Diagnosis not present

## 2017-08-07 DIAGNOSIS — E669 Obesity, unspecified: Secondary | ICD-10-CM | POA: Diagnosis not present

## 2017-08-07 LAB — COMPLETE METABOLIC PANEL WITH GFR
AG Ratio: 1.9 (calc) (ref 1.0–2.5)
ALT: 13 U/L (ref 6–29)
AST: 19 U/L (ref 10–35)
Albumin: 3.7 g/dL (ref 3.6–5.1)
Alkaline phosphatase (APISO): 81 U/L (ref 33–115)
BUN: 7 mg/dL (ref 7–25)
CALCIUM: 8.4 mg/dL — AB (ref 8.6–10.2)
CHLORIDE: 106 mmol/L (ref 98–110)
CO2: 30 mmol/L (ref 20–32)
Creat: 0.59 mg/dL (ref 0.50–1.10)
GFR, Est African American: 127 mL/min/{1.73_m2} (ref >=60)
GFR, Est Non African American: 110 mL/min/{1.73_m2} (ref >=60)
Globulin: 2 g/dL (calc) (ref 1.9–3.7)
Glucose, Bld: 84 mg/dL (ref 65–99)
Potassium: 3.5 mmol/L (ref 3.5–5.3)
Sodium: 141 mmol/L (ref 135–146)
Total Bilirubin: 0.6 mg/dL (ref 0.2–1.2)
Total Protein: 5.7 g/dL — ABNORMAL LOW (ref 6.1–8.1)

## 2017-08-07 LAB — POCT URINALYSIS DIP (DEVICE)
Bilirubin Urine: NEGATIVE
Glucose, UA: NEGATIVE mg/dL
HGB URINE DIPSTICK: NEGATIVE
Ketones, ur: NEGATIVE mg/dL
LEUKOCYTES UA: NEGATIVE
Nitrite: NEGATIVE
PH: 6 (ref 5.0–8.0)
PROTEIN: NEGATIVE mg/dL
SPECIFIC GRAVITY, URINE: 1.015 (ref 1.005–1.030)
UROBILINOGEN UA: 0.2 mg/dL (ref 0.0–1.0)

## 2017-08-07 LAB — CBC WITH DIFFERENTIAL/PLATELET
BASOS PCT: 0.7 %
Basophils Absolute: 50 cells/uL (ref 0–200)
EOS ABS: 50 {cells}/uL (ref 15–500)
Eosinophils Relative: 0.7 %
HCT: 36.7 % (ref 35.0–45.0)
Hemoglobin: 10.6 g/dL — ABNORMAL LOW (ref 11.7–15.5)
Lymphs Abs: 1109 cells/uL (ref 850–3900)
MCH: 20.9 pg — ABNORMAL LOW (ref 27.0–33.0)
MCHC: 28.9 g/dL — ABNORMAL LOW (ref 32.0–36.0)
MCV: 72.5 fL — AB (ref 80.0–100.0)
MPV: 13.2 fL — ABNORMAL HIGH (ref 7.5–12.5)
Monocytes Relative: 5 %
Neutro Abs: 5630 cells/uL (ref 1500–7800)
Neutrophils Relative %: 78.2 %
Platelets: 104 10*3/uL — ABNORMAL LOW (ref 140–400)
RBC: 5.06 10*6/uL (ref 3.80–5.10)
RDW: 14.9 % (ref 11.0–15.0)
TOTAL LYMPHOCYTE: 15.4 %
WBC: 7.2 10*3/uL (ref 3.8–10.8)
WBCMIX: 360 {cells}/uL (ref 200–950)

## 2017-08-07 LAB — POCT GLYCOSYLATED HEMOGLOBIN (HGB A1C): Hemoglobin A1C: 4.7

## 2017-08-07 MED ORDER — ALBUTEROL SULFATE HFA 108 (90 BASE) MCG/ACT IN AERS: 2.0000 | INHALATION_SPRAY | Freq: Four times a day (QID) | RESPIRATORY_TRACT | 5 refills | Status: DC | PRN

## 2017-08-07 MED ORDER — ESOMEPRAZOLE MAGNESIUM 40 MG PO CPDR: 40.0000 mg | DELAYED_RELEASE_CAPSULE | Freq: Every day | ORAL | 1 refills | Status: DC

## 2017-08-07 MED ORDER — BUDESONIDE-FORMOTEROL FUMARATE 160-4.5 MCG/ACT IN AERO: 2.0000 | INHALATION_SPRAY | Freq: Two times a day (BID) | RESPIRATORY_TRACT | 12 refills | Status: DC

## 2017-08-07 NOTE — Progress Notes (Signed)
Subjective:    Patient ID: Barbara Stanley, female    DOB: 04/07/1971, 46 y.o.   MRN: [id number]  HPI Barbara Stanley, a 46 year old female accompanied by husband to establish care. Barbara Stanley was a patient of Dr. Nelida Gores, who retired greater than 1 year ago. Patient has a history of major depression and PTSD  followed by [facility]. She says that depression has improved on current medication regimen. She reports occasional anhedonia and feelings of sadness. She denies current suicidal and homicidal plan or intent.  She complains of the following side effects from the treatment: dry mouth.   Patient complains of periodic fatigue. Symptoms began several months ago.  Symptoms of her fatigue have been diffuse soft tissue aches and pains and general malaise. Patient denies symptoms of arthritis, exercise intolerance, unusual rashes, GI blood loss and excessive menstrual bleeding. Symptoms have been intermittent. Severity has been mild.   Barbara Stanley also has a history of COPD. She is a chronic everyday smoker. She smokes 1 pack per day. She is not ready to quit smoking. She has attempted to quit in the past without success. She has been out of Symbicort. She endorses shortness of breath with exertion. She is not on oxygen therapy.  Past Medical History:  Diagnosis Date  . Anemia   . COPD (chronic obstructive pulmonary disease) ([place])   . Depression   . Emphysema   . GERD (gastroesophageal reflux disease)   . Tobacco abuse    Social History   Social History  . Marital status: Single    Spouse name: N/A  . Number of children: N/A  . Years of education: N/A   Occupational History  . Not on file.   Social History Main Topics  . Smoking status: Current Every Day Smoker    Packs/day: 1.00    Years: 1.00    Types: Cigarettes  . Smokeless tobacco: Never Used  . Alcohol use No     Comment: occas  . Drug use: No  . Sexual activity: Yes    Birth control/ protection: None    Other Topics Concern  . Not on file   Social History Narrative  . No narrative on file     Review of Systems  Constitutional: Positive for fatigue.  HENT: Negative.   Eyes: Negative for photophobia, redness and visual disturbance.  Respiratory: Positive for cough and shortness of breath.   Cardiovascular: Negative.  Negative for chest pain.  Gastrointestinal: Negative.   Endocrine: Negative for polydipsia, polyphagia and polyuria.  Musculoskeletal: Negative.   Skin: Negative.   Allergic/Immunologic: Negative for immunocompromised state.  Neurological: Negative.  Negative for dizziness, facial asymmetry, light-headedness and headaches.  Hematological: Negative.   Psychiatric/Behavioral: The patient is nervous/anxious.        Depression       Objective:   Physical Exam  Constitutional: She is oriented to person, place, and time. She appears well-developed. She does not appear ill. No distress.  HENT:  Dry mouth  Cardiovascular: Normal rate, regular rhythm, normal heart sounds and intact distal pulses.   Pulmonary/Chest: Effort normal and breath sounds normal. No respiratory distress. She has no wheezes. She has no rales. She exhibits no tenderness.  Abdominal: Soft. Bowel sounds are normal.  Abdominal obestiy  Musculoskeletal: Normal range of motion.  Neurological: She is alert and oriented to person, place, and time. She has normal reflexes.  Skin: Skin is warm and dry.  Psychiatric: Her behavior is normal. Judgment  and thought content normal. Cognition and memory are normal. She exhibits a depressed mood. She expresses no homicidal and no suicidal ideation.  Flat affect       BP 95/68 (BP Location: Left Arm, Patient Position: Sitting, Cuff Size: Large)   Pulse 97   Temp 97.8 F (36.6 C) (Oral)   Resp 16   Ht 5\' 8"  (1.727 m)   Wt 238 lb (108 kg)   SpO2 97%   BMI 36.19 kg/m  Assessment & Plan:  1. Chronic obstructive pulmonary disease, unspecified COPD type  (HCC) - albuterol (PROAIR HFA) 108 (90 Base) MCG/ACT inhaler; Inhale 2 puffs into the lungs every 6 (six) hours as needed for wheezing or shortness of breath.  Dispense: 1 each; Refill: 5 - DG Chest 2 View; Future - budesonide-formoterol (SYMBICORT) 160-4.5 MCG/ACT inhaler; Inhale 2 puffs into the lungs 2 (two) times daily.  Dispense: 1 Inhaler; Refill: 12  2. History of gastric bypass Barbara Stanley reports a history of periodic diarrhea since gastric bypass 15 years ago.  Recommend a lowfat, low carbohydrate diet.  Body mass index is 36.19 kg/m.   3. Gastroesophageal reflux disease without esophagitis - esomeprazole (NEXIUM) 40 MG capsule; Take 1 capsule (40 mg total) by mouth daily.  Dispense: 90 capsule; Refill: 1 - Ambulatory referral to Gastroenterology  4. Chronic diarrhea - Ambulatory referral to Gastroenterology  5. Screening cholesterol level - Lipid Panel; Future  6. Obesity (BMI 30-39.9) Recommend a lowfat, low carbohydrate diet divided over 5-6 small meals, increase water intake to 6-8 glasses, and 150 minutes per week of cardiovascular exercise.   - COMPLETE METABOLIC PANEL WITH GFR - HgB A1c  7. Iron deficiency anemia, unspecified iron deficiency anemia type - CBC with Differential  8. Need for Tdap vaccination - Tdap vaccine greater than or equal to 7yo IM  9. Immunization due - Pneumococcal polysaccharide vaccine 23-valent greater than or equal to 2yo subcutaneous/IM  10. Influenza vaccination given - Flu Vaccine QUAD 6+ mos PF IM (Fluarix Quad PF)  11. Screening for breast cancer - MM DIGITAL SCREENING BILATERAL; Future  12. Major Depressive Disorder Continue to follow with psychiatry as scheduled   RTC; 3 months for chronic conditions   [place]  MSN, FNP-C [facility] [address] [place], [place] [address] [phone]

## 2017-08-07 NOTE — Patient Instructions (Signed)
1. Will sent a referral to gastroenterology for GERD and chronic diarrhea. Will continue Nexium 40 mg. Recommend a lowfat, low carbohydrate diet divided over 5-6 small meals, increase water intake to 6-8 glasses, and 150 minutes per week of cardiovascular exercise.   2. Persistent cough Albuterol 2 puffs every 4 as needed for cough, shortness of breath and/or wheezing Will send order for chest xray Will follow up by phone after chest xray   3. Follow up with psychiatry as scheduled  4. Sent order for mammogram ( schedule soon)  5. Smoking cessation recommended.

## 2017-08-08 ENCOUNTER — Other Ambulatory Visit: Payer: Self-pay | Admitting: Family Medicine

## 2017-08-08 MED ORDER — TIOTROPIUM BROMIDE MONOHYDRATE 18 MCG IN CAPS: 18.0000 ug | ORAL_CAPSULE | Freq: Every day | RESPIRATORY_TRACT | 12 refills | Status: DC

## 2017-08-24 ENCOUNTER — Other Ambulatory Visit: Payer: Self-pay

## 2017-08-24 ENCOUNTER — Encounter: Payer: Self-pay | Admitting: Family Medicine

## 2017-08-24 MED ORDER — TIOTROPIUM BROMIDE MONOHYDRATE 18 MCG IN CAPS: 18.0000 ug | ORAL_CAPSULE | Freq: Every day | RESPIRATORY_TRACT | 12 refills | Status: DC

## 2017-08-24 NOTE — Telephone Encounter (Signed)
Sent int medication to corrected pharmacy. Thanks!

## 2017-09-14 ENCOUNTER — Ambulatory Visit: Payer: Self-pay

## 2017-10-13 ENCOUNTER — Encounter: Payer: Self-pay | Admitting: Family Medicine

## 2017-11-08 ENCOUNTER — Ambulatory Visit: Payer: Self-pay | Admitting: Family Medicine

## 2018-02-21 ENCOUNTER — Other Ambulatory Visit: Payer: Self-pay | Admitting: Family Medicine

## 2018-02-21 DIAGNOSIS — K219 Gastro-esophageal reflux disease without esophagitis: Secondary | ICD-10-CM

## 2018-07-26 ENCOUNTER — Telehealth: Payer: Self-pay

## 2018-07-26 NOTE — Telephone Encounter (Signed)
Left a vm to let patient know her appointment is tomorrow at 1040am and to give [place] a call to reschedule is she is unable to make appoinment

## 2018-07-27 ENCOUNTER — Encounter: Payer: Self-pay | Admitting: Family Medicine

## 2018-07-27 ENCOUNTER — Ambulatory Visit (HOSPITAL_COMMUNITY)
Admission: RE | Admit: 2018-07-27 | Discharge: 2018-07-27 | Disposition: A | Discharge status: Home / Self Care | Payer: Medicaid Other | Source: Ambulatory Visit | Attending: Family Medicine | Admitting: Family Medicine

## 2018-07-27 ENCOUNTER — Ambulatory Visit (INDEPENDENT_AMBULATORY_CARE_PROVIDER_SITE_OTHER): Payer: Medicaid Other | Admitting: Family Medicine

## 2018-07-27 VITALS — BP 116/69 | HR 64 | Temp 99.3°F | Resp 16 | Ht 68.0 in | Wt 216.0 lb

## 2018-07-27 DIAGNOSIS — E876 Hypokalemia: Secondary | ICD-10-CM

## 2018-07-27 DIAGNOSIS — K909 Intestinal malabsorption, unspecified: Secondary | ICD-10-CM

## 2018-07-27 DIAGNOSIS — Z114 Encounter for screening for human immunodeficiency virus [HIV]: Secondary | ICD-10-CM

## 2018-07-27 DIAGNOSIS — J90 Pleural effusion, not elsewhere classified: Secondary | ICD-10-CM | POA: Insufficient documentation

## 2018-07-27 DIAGNOSIS — Z862 Personal history of diseases of the blood and blood-forming organs and certain disorders involving the immune mechanism: Secondary | ICD-10-CM

## 2018-07-27 DIAGNOSIS — J449 Chronic obstructive pulmonary disease, unspecified: Secondary | ICD-10-CM | POA: Diagnosis not present

## 2018-07-27 DIAGNOSIS — Z23 Encounter for immunization: Secondary | ICD-10-CM

## 2018-07-27 DIAGNOSIS — R0781 Pleurodynia: Secondary | ICD-10-CM | POA: Diagnosis present

## 2018-07-27 DIAGNOSIS — R197 Diarrhea, unspecified: Secondary | ICD-10-CM

## 2018-07-27 DIAGNOSIS — Z9884 Bariatric surgery status: Secondary | ICD-10-CM | POA: Diagnosis not present

## 2018-07-27 DIAGNOSIS — R5383 Other fatigue: Secondary | ICD-10-CM

## 2018-07-27 MED ORDER — PNEUMOCOCCAL 13-VAL CONJ VACC IM SUSP
0.5000 mL | INTRAMUSCULAR | Status: AC
  Administered 2018-07-27: 0.5 mL via INTRAMUSCULAR

## 2018-07-27 MED ORDER — IPRATROPIUM BROMIDE 0.02 % IN SOLN
0.5000 mg | Freq: Once | RESPIRATORY_TRACT | Status: AC
  Administered 2018-07-27: 0.5 mg via RESPIRATORY_TRACT

## 2018-07-27 MED ORDER — METHYLPREDNISOLONE 4 MG PO TBPK: ORAL_TABLET | ORAL | 0 refills | Status: DC

## 2018-07-27 MED ORDER — ALBUTEROL SULFATE (2.5 MG/3ML) 0.083% IN NEBU
2.5000 mg | INHALATION_SOLUTION | Freq: Once | RESPIRATORY_TRACT | Status: AC
  Administered 2018-07-27: 2.5 mg via RESPIRATORY_TRACT

## 2018-07-27 MED ORDER — MOXIFLOXACIN HCL 400 MG PO TABS: 400.0000 mg | ORAL_TABLET | Freq: Every day | ORAL | 0 refills | Status: DC

## 2018-07-27 NOTE — Patient Instructions (Signed)
Steps to Quit Smoking Smoking tobacco can be bad for your health. It can also affect almost every organ in your body. Smoking puts you and people around you at risk for many serious long-lasting (chronic) diseases. Quitting smoking is hard, but it is one of the best things that you can do for your health. It is never too late to quit. What are the benefits of quitting smoking? When you quit smoking, you lower your risk for getting serious diseases and conditions. They can include:  Lung cancer or lung disease.  Heart disease.  Stroke.  Heart attack.  Not being able to have children (infertility).  Weak bones (osteoporosis) and broken bones (fractures).  If you have coughing, wheezing, and shortness of breath, those symptoms may get better when you quit. You may also get sick less often. If you are pregnant, quitting smoking can help to lower your chances of having a baby of low birth weight. What can I do to help me quit smoking? Talk with your doctor about what can help you quit smoking. Some things you can do (strategies) include:  Quitting smoking totally, instead of slowly cutting back how much you smoke over a period of time.  Going to in-person counseling. You are more likely to quit if you go to many counseling sessions.  Using resources and support systems, such as: ? Online chats with a counselor. ? Phone quitlines. ? Printed self-help materials. ? Support groups or group counseling. ? Text messaging programs. ? Mobile phone apps or applications.  Taking medicines. Some of these medicines may have nicotine in them. If you are pregnant or breastfeeding, do not take any medicines to quit smoking unless your doctor says it is okay. Talk with your doctor about counseling or other things that can help you.  Talk with your doctor about using more than one strategy at the same time, such as taking medicines while you are also going to in-person counseling. This can help make  quitting easier. What things can I do to make it easier to quit? Quitting smoking might feel very hard at first, but there is a lot that you can do to make it easier. Take these steps:  Talk to your family and friends. Ask them to support and encourage you.  Call phone quitlines, reach out to support groups, or work with a counselor.  Ask people who smoke to not smoke around you.  Avoid places that make you want (trigger) to smoke, such as: ? Bars. ? Parties. ? Smoke-break areas at work.  Spend time with people who do not smoke.  Lower the stress in your life. Stress can make you want to smoke. Try these things to help your stress: ? Getting regular exercise. ? Deep-breathing exercises. ? Yoga. ? Meditating. ? Doing a body scan. To do this, close your eyes, focus on one area of your body at a time from head to toe, and notice which parts of your body are tense. Try to relax the muscles in those areas.  Download or buy apps on your mobile phone or tablet that can help you stick to your quit plan. There are many free apps, such as QuitGuide from the CDC (Centers for Disease Control and Prevention). You can find more support from smokefree.gov and other websites.  This information is not intended to replace advice given to you by your health care provider. Make sure you discuss any questions you have with your health care provider. Document Released: 07/30/2009 Document   Revised: 05/31/2016 Document Reviewed: 02/17/2015 Elsevier Interactive Patient Education  2018 [employer]. Acute Bronchitis, Adult Acute bronchitis is when air tubes (bronchi) in the lungs suddenly get swollen. The condition can make it hard to breathe. It can also cause these symptoms:  A cough.  Coughing up clear, yellow, or green mucus.  Wheezing.  Chest congestion.  Shortness of breath.  A fever.  Body aches.  Chills.  A sore throat.  Follow these instructions at home: Medicines  Take  over-the-counter and prescription medicines only as told by your doctor.  If you were prescribed an antibiotic medicine, take it as told by your doctor. Do not stop taking the antibiotic even if you start to feel better. General instructions  Rest.  Drink enough fluids to keep your pee (urine) clear or pale yellow.  Avoid smoking and secondhand smoke. If you smoke and you need help quitting, ask your doctor. Quitting will help your lungs heal faster.  Use an inhaler, cool mist vaporizer, or humidifier as told by your doctor.  Keep all follow-up visits as told by your doctor. This is important. How is this prevented? To lower your risk of getting this condition again:  Wash your hands often with soap and water. If you cannot use soap and water, use hand sanitizer.  Avoid contact with people who have cold symptoms.  Try not to touch your hands to your mouth, nose, or eyes.  Make sure to get the flu shot every year.  Contact a doctor if:  Your symptoms do not get better in 2 weeks. Get help right away if:  You cough up blood.  You have chest pain.  You have very bad shortness of breath.  You become dehydrated.  You faint (pass out) or keep feeling like you are going to pass out.  You keep throwing up (vomiting).  You have a very bad headache.  Your fever or chills gets worse. This information is not intended to replace advice given to you by your health care provider. Make sure you discuss any questions you have with your health care provider. Document Released: 03/21/2008 Document Revised: 05/11/2016 Document Reviewed: 03/23/2016 Elsevier Interactive Patient Education  2018 [employer] Chronic Obstructive Pulmonary Disease Chronic obstructive pulmonary disease (COPD) is a long-term (chronic) lung problem. When you have COPD, it is hard for air to get in and out of your lungs. The way your lungs work will never return to normal. Usually the condition gets worse over  time. There are things you can do to keep yourself as healthy as possible. Your doctor may treat your condition with:  Medicines.  Quitting smoking, if you smoke.  Rehabilitation. This may involve a team of specialists.  Oxygen.  Exercise and changes to your diet.  Lung surgery.  Comfort measures (palliative care).  Follow these instructions at home: Medicines  Take over-the-counter and prescription medicines only as told by your doctor.  Talk to your doctor before taking any cough or allergy medicines. You may need to avoid medicines that cause your lungs to be dry. Lifestyle  If you smoke, stop. Smoking makes the problem worse. If you need help quitting, ask your doctor.  Avoid being around things that make your breathing worse. This may include smoke, chemicals, and fumes.  Stay active, but remember to also rest.  Learn and use tips on how to relax.  Make sure you get enough sleep. Most adults need at least 7 hours a night.  Eat healthy foods. Eat  smaller meals more often. Rest before meals. Controlled breathing  Learn and use tips on how to control your breathing as told by your doctor. Try: ? Breathing in (inhaling) through your nose for 1 second. Then, pucker your lips and breath out (exhale) through your lips for 2 seconds. ? Putting one hand on your belly (abdomen). Breathe in slowly through your nose for 1 second. Your hand on your belly should move out. Pucker your lips and breathe out slowly through your lips. Your hand on your belly should move in as you breathe out. Controlled coughing  Learn and use controlled coughing to clear mucus from your lungs. The steps are: 1. Lean your head a little forward. 2. Breathe in deeply. 3. Try to hold your breath for 3 seconds. 4. Keep your mouth slightly open while coughing 2 times. 5. Spit any mucus out into a tissue. 6. Rest and do the steps again 1 or 2 times as needed. General instructions  Make sure you get all  the shots (vaccines) that your doctor recommends. Ask your doctor about a flu shot and a pneumonia shot.  Use oxygen therapy and therapy to help improve your lungs (pulmonary rehabilitation) if told by your doctor. If you need home oxygen therapy, ask your doctor if you should buy a tool to measure your oxygen level (oximeter).  Make a COPD action plan with your doctor. This helps you know what to do if you feel worse than usual.  Manage any other conditions you have as told by your doctor.  Avoid going outside when it is very hot, cold, or humid.  Avoid people who have a sickness you can catch (contagious).  Keep all follow-up visits as told by your doctor. This is important. Contact a doctor if:  You cough up more mucus than usual.  There is a change in the color or thickness of the mucus.  It is harder to breathe than usual.  Your breathing is faster than usual.  You have trouble sleeping.  You need to use your medicines more often than usual.  You have trouble doing your normal activities such as getting dressed or walking around the house. Get help right away if:  You have shortness of breath while resting.  You have shortness of breath that stops you from: ? Being able to talk. ? Doing normal activities.  Your chest hurts for longer than 5 minutes.  Your skin color is more blue than usual.  Your pulse oximeter shows that you have low oxygen for longer than 5 minutes.  You have a fever.  You feel too tired to breathe normally. Summary  Chronic obstructive pulmonary disease (COPD) is a long-term lung problem.  The way your lungs work will never return to normal. Usually the condition gets worse over time. There are things you can do to keep yourself as healthy as possible.  Take over-the-counter and prescription medicines only as told by your doctor.  If you smoke, stop. Smoking makes the problem worse. This information is not intended to replace advice given  to you by your health care provider. Make sure you discuss any questions you have with your health care provider. Document Released: 03/21/2008 Document Revised: 03/10/2016 Document Reviewed: 05/30/2013 Elsevier Interactive Patient Education  2017 [employer].

## 2018-07-27 NOTE — Progress Notes (Signed)
[facility]   Progress Note: General Provider: Lanae Boast, FNP  SUBJECTIVE:   Barbara Stanley is a 47 y.o. female who  has a past medical history of Anemia, COPD (chronic obstructive pulmonary disease) ([place]), Depression, Emphysema, GERD (gastroesophageal reflux disease), and Tobacco abuse.. Patient presents today for COPD; Fatigue; Fall; Abdominal Pain; and Speech Problem Patient presents with wheezing, fatigue, chronic stomach pain and changes in her speech pattern.  Patient states that she had a fall a few weeks ago and is now having right-sided flank pain and upper back pain.  Patient with a history history of COPD and smokes 1 pack/day.  Patient states that she has Spiriva but does not have albuterol.  She complains of wheezing.  Patient states that she has a history of gastric bypass surgery and has daily abdominal pain and diarrhea.  Patient states that she was informed to take vitamin D 12 every day.  Has not been taking.  She also has a history of receiving vitamin B12 shots and has not had them in several years. Patient states that she feels that her speech has changed in the past few weeks.  She states that she speaks to the lower has to search for words.  Patient denies any tingling of her hands feet upper and lower extremities.  Denies paralysis or changes in muscle movement. Review of Systems  Constitutional: Positive for malaise/fatigue.  HENT: Negative.   Eyes: Negative.   Respiratory: Positive for cough, sputum production, shortness of breath and wheezing.   Cardiovascular: Negative.   Gastrointestinal: Positive for abdominal pain and diarrhea.  Genitourinary: Negative.   Musculoskeletal: Negative.   Skin: Negative.   Neurological: Positive for speech change.  Psychiatric/Behavioral: Negative.      OBJECTIVE: BP 116/69 (BP Location: Right Arm, Patient Position: Sitting, Cuff Size: Normal)   Pulse 64   Temp 99.3 F (37.4  C) (Oral)   Resp 16   Ht 5\' 8"  (1.727 m)   Wt 216 lb (98 kg)   SpO2 98%   BMI 32.84 kg/m   Physical Exam  Constitutional: She is oriented to person, place, and time. She appears well-developed and well-nourished. No distress.  HENT:  Head: Normocephalic and atraumatic.  Eyes: Pupils are equal, round, and reactive to light. Conjunctivae and EOM are normal.  Neck: Normal range of motion.  Cardiovascular: Normal rate, regular rhythm, normal heart sounds and intact distal pulses.  Pulmonary/Chest: Effort normal. No respiratory distress. She has no decreased breath sounds. She has wheezes in the right upper field, the right middle field, the right lower field, the left upper field, the left middle field and the left lower field. She has no rhonchi. She has no rales.  Abdominal: Soft. Bowel sounds are normal. She exhibits no distension. There is no tenderness.  Musculoskeletal: Normal range of motion.  Neurological: She is alert and oriented to person, place, and time.  Skin: Skin is warm and dry.  Psychiatric: She has a normal mood and affect. Her behavior is normal. Judgment and thought content normal.  Nursing note and vitals reviewed.   ASSESSMENT/PLAN:  1. Chronic obstructive pulmonary disease, unspecified COPD type (HCC) - pneumococcal 13-valent conjugate vaccine (PREVNAR 13) injection 0.5 mL - DG Ribs Bilateral W/Chest; Future - albuterol (PROVENTIL) (2.5 MG/3ML) 0.083% nebulizer solution 2.5 mg - ipratropium (ATROVENT) nebulizer solution 0.5 mg - methylPREDNISolone (MEDROL DOSEPAK) 4 MG TBPK tablet; Take as directed on pack  Dispense: 21 tablet; Refill: 0 -  moxifloxacin (AVELOX) 400 MG tablet; Take 1 tablet (400 mg total) by mouth daily.  Dispense: 7 tablet; Refill: 0  2. Personal hx of gastric bypass Assessing for vitamin deficiency - Ambulatory referral to Gastroenterology - Vitamin B12 - Vitamin B6 - Vitamin B1 - Folate - Copper, Serum - Magnesium  3. Hx of iron  deficiency anemia Pending labs. Will adjust medications accordingly.   - Comprehensive metabolic panel - CBC with Differential - Iron, TIBC and Ferritin Panel  4. Diarrhea due to malabsorption - Comprehensive metabolic panel - Ambulatory referral to Gastroenterology - Folate - Copper, Serum - Magnesium  5. Screening for HIV (human immunodeficiency virus) - HIV antibody (with reflex)  6. Fatigue, unspecified type - Thyroid Panel With TSH      The patient was given clear instructions to go to ER or return to medical center if symptoms do not improve, worsen or new problems develop. The patient verbalized understanding and agreed with plan of care.   [name], FNP-BC [facility] [address] [place], [place] [address] [phone]     This note has been created with Dragon speech recognition software and smart phrase technology. Any transcriptional errors are unintentional.

## 2018-07-28 LAB — THYROID PANEL WITH TSH
Free Thyroxine Index: 2.4 (ref 1.2–4.9)
T3 Uptake Ratio: 29 % (ref 24–39)
T4, Total: 8.4 ug/dL (ref 4.5–12.0)
TSH: 1.34 u[IU]/mL (ref 0.450–4.500)

## 2018-07-30 ENCOUNTER — Telehealth: Payer: Self-pay

## 2018-07-30 MED ORDER — POTASSIUM CHLORIDE ER 20 MEQ PO TBCR: 20.0000 meq | EXTENDED_RELEASE_TABLET | Freq: Every day | ORAL | 1 refills | Status: DC

## 2018-07-30 NOTE — Telephone Encounter (Signed)
-----   Message from Lanae Boast, [place] sent at 07/30/2018 10:45 AM EDT ----- Your labs are abnormal. Your potassium, copper, folate, potassium and platelets are severely low. I need for you to come in for an infusion of vitamins and fluids.  This is most likely the cause to your speech issues. If your symptoms have worsened, you will need to go directly to the ED.

## 2018-07-30 NOTE — Addendum Note (Signed)
Addended by: Genelle Bal on: 07/30/2018 10:52 AM   Modules accepted: Orders

## 2018-07-30 NOTE — Telephone Encounter (Signed)
Called and spoke with patient. Advised that labs are abnormal. Advised that potassium, copper, folate, and plantlets are severely low. Advised her that we needed her to come in for a transfusion of vitamins and fluids, she states she could not come in today. We have scheduled an appointment for tomorrow 07/31/2018 at our day hospital. She was advised this could be the cause of the speech issues and if symptoms get worse to go directly to the ED. Patient verbalized understanding. Thanks!

## 2018-07-31 ENCOUNTER — Telehealth: Payer: Self-pay

## 2018-07-31 ENCOUNTER — Ambulatory Visit (HOSPITAL_COMMUNITY)
Admission: RE | Admit: 2018-07-31 | Discharge: 2018-07-31 | Disposition: A | Discharge status: Home / Self Care | Payer: Medicaid Other | Source: Ambulatory Visit | Attending: Family Medicine | Admitting: Family Medicine

## 2018-07-31 ENCOUNTER — Other Ambulatory Visit: Payer: Self-pay | Admitting: Family Medicine

## 2018-07-31 DIAGNOSIS — Z9884 Bariatric surgery status: Secondary | ICD-10-CM

## 2018-07-31 DIAGNOSIS — J449 Chronic obstructive pulmonary disease, unspecified: Secondary | ICD-10-CM

## 2018-07-31 DIAGNOSIS — M549 Dorsalgia, unspecified: Secondary | ICD-10-CM | POA: Insufficient documentation

## 2018-07-31 DIAGNOSIS — E46 Unspecified protein-calorie malnutrition: Secondary | ICD-10-CM

## 2018-07-31 DIAGNOSIS — D696 Thrombocytopenia, unspecified: Secondary | ICD-10-CM | POA: Insufficient documentation

## 2018-07-31 DIAGNOSIS — R000 Tachycardia, unspecified: Secondary | ICD-10-CM | POA: Insufficient documentation

## 2018-07-31 DIAGNOSIS — D649 Anemia, unspecified: Secondary | ICD-10-CM

## 2018-07-31 MED ORDER — THIAMINE HCL 100 MG/ML IJ SOLN
INTRAVENOUS | Status: DC
  Administered 2018-07-31: 10:00:00 via INTRAVENOUS
  Filled 2018-07-31: qty 1000

## 2018-07-31 NOTE — Discharge Instructions (Signed)
Today you received an IV infusion of sodium chloride 0.9 % 1,000 mL with thiamine 206 mg, folic acid 1 mg, multivitamins adult 10 mL, potassium chloride 10 mEq infusion.  Come back tomorrow for lab work.

## 2018-07-31 NOTE — Progress Notes (Signed)
PATIENT CARE CENTER NOTE   Provider: Lanae Boast, FNP   Procedure: Banana bag infusion (1,000 mL 0.9%  Sodium chloride with thiamine, folic acid, multivitamins and potassium chloride).    Note: Patient received a Banana bag infusion. Tolerated infusion well. Vital signs stable. Discharge instructions given. Patient to come back tomorrow for follow-up labs. Patient alert, oriented and ambulatory at discharge.

## 2018-07-31 NOTE — Progress Notes (Signed)
Patient to have 1 banana bag on Tuesday. Repeat CMP prior to second bag on Thursday.

## 2018-07-31 NOTE — Telephone Encounter (Signed)
Patient in office today receiving banana bag infusion. She was informed to come in tomorrow for labs and fluids on Thursday. Thanks!

## 2018-07-31 NOTE — Telephone Encounter (Signed)
-----   Message from Lanae Boast, [place] sent at 07/31/2018  8:04 AM EDT ----- Patient is to come in Tuesday 07/31/2018 for banana bag infusion. Labs on Wednesday and return on Thursday for 2nd infusion. Patient has been notified of her results. Potassium sent to pharmacy.

## 2018-08-01 ENCOUNTER — Other Ambulatory Visit: Payer: Medicaid Other

## 2018-08-01 DIAGNOSIS — Z9884 Bariatric surgery status: Secondary | ICD-10-CM

## 2018-08-02 ENCOUNTER — Ambulatory Visit (HOSPITAL_COMMUNITY)
Admission: RE | Admit: 2018-08-02 | Discharge: 2018-08-02 | Disposition: A | Discharge status: Home / Self Care | Payer: Medicaid Other | Source: Ambulatory Visit | Attending: Family Medicine | Admitting: Family Medicine

## 2018-08-02 ENCOUNTER — Encounter (HOSPITAL_COMMUNITY): Payer: Self-pay

## 2018-08-02 ENCOUNTER — Other Ambulatory Visit: Payer: Self-pay

## 2018-08-02 ENCOUNTER — Emergency Department (HOSPITAL_COMMUNITY)
Admission: EM | Admit: 2018-08-02 | Discharge: 2018-08-02 | Disposition: A | Discharge status: Home / Self Care | Payer: Medicaid Other | Attending: Emergency Medicine | Admitting: Emergency Medicine

## 2018-08-02 DIAGNOSIS — E876 Hypokalemia: Secondary | ICD-10-CM

## 2018-08-02 DIAGNOSIS — Z79899 Other long term (current) drug therapy: Secondary | ICD-10-CM | POA: Diagnosis not present

## 2018-08-02 DIAGNOSIS — J449 Chronic obstructive pulmonary disease, unspecified: Secondary | ICD-10-CM | POA: Diagnosis not present

## 2018-08-02 DIAGNOSIS — F17210 Nicotine dependence, cigarettes, uncomplicated: Secondary | ICD-10-CM | POA: Diagnosis not present

## 2018-08-02 DIAGNOSIS — R531 Weakness: Secondary | ICD-10-CM | POA: Diagnosis present

## 2018-08-02 LAB — COMPREHENSIVE METABOLIC PANEL
ALT: 12 IU/L (ref 0–32)
AST: 21 IU/L (ref 0–40)
Albumin/Globulin Ratio: 1.9 (ref 1.2–2.2)
Albumin: 3.3 g/dL — ABNORMAL LOW (ref 3.5–5.5)
Alkaline Phosphatase: 94 IU/L (ref 39–117)
BUN/Creatinine Ratio: 17 (ref 9–23)
BUN: 10 mg/dL (ref 6–24)
Bilirubin Total: 0.5 mg/dL (ref 0.0–1.2)
CO2: 28 mmol/L (ref 20–29)
Calcium: 8.1 mg/dL — ABNORMAL LOW (ref 8.7–10.2)
Chloride: 103 mmol/L (ref 96–106)
Creatinine, Ser: 0.6 mg/dL (ref 0.57–1.00)
GFR calc Af Amer: 126 mL/min/{1.73_m2} (ref >59)
GFR calc non Af Amer: 109 mL/min/{1.73_m2} (ref >59)
Globulin, Total: 1.7 g/dL (ref 1.5–4.5)
Glucose: 79 mg/dL (ref 65–99)
Potassium: 2.8 mmol/L — ABNORMAL LOW (ref 3.5–5.2)
Sodium: 144 mmol/L (ref 134–144)
Total Protein: 5 g/dL — ABNORMAL LOW (ref 6.0–8.5)

## 2018-08-02 LAB — URINALYSIS, ROUTINE W REFLEX MICROSCOPIC
Bilirubin Urine: NEGATIVE
Glucose, UA: NEGATIVE mg/dL
Hgb urine dipstick: NEGATIVE
Ketones, ur: 5 mg/dL — AB
LEUKOCYTES UA: NEGATIVE
Nitrite: NEGATIVE
PROTEIN: NEGATIVE mg/dL
SPECIFIC GRAVITY, URINE: 1.028 (ref 1.005–1.030)
pH: 5 (ref 5.0–8.0)

## 2018-08-02 LAB — CBC
HCT: 35.3 % — ABNORMAL LOW (ref 36.0–46.0)
HEMOGLOBIN: 10.3 g/dL — AB (ref 12.0–15.0)
MCH: 25.9 pg — AB (ref 26.0–34.0)
MCHC: 29.2 g/dL — ABNORMAL LOW (ref 30.0–36.0)
MCV: 88.7 fL (ref 80.0–100.0)
NRBC: 0 % (ref 0.0–0.2)
Platelets: 32 10*3/uL — ABNORMAL LOW (ref 150–400)
RBC: 3.98 MIL/uL (ref 3.87–5.11)
RDW: 17.9 % — ABNORMAL HIGH (ref 11.5–15.5)
WBC: 4.8 10*3/uL (ref 4.0–10.5)

## 2018-08-02 LAB — CBC WITH DIFFERENTIAL/PLATELET
Basophils Absolute: 0 10*3/uL (ref 0.0–0.2)
Basos: 1 %
EOS (ABSOLUTE): 0 10*3/uL (ref 0.0–0.4)
Eos: 0 %
Hematocrit: 32.9 % — ABNORMAL LOW (ref 34.0–46.6)
Hemoglobin: 10.3 g/dL — ABNORMAL LOW (ref 11.1–15.9)
Immature Grans (Abs): 0 10*3/uL (ref 0.0–0.1)
Immature Granulocytes: 0 %
Lymphocytes Absolute: 0.9 10*3/uL (ref 0.7–3.1)
Lymphs: 30 %
MCH: 25.9 pg — ABNORMAL LOW (ref 26.6–33.0)
MCHC: 31.3 g/dL — ABNORMAL LOW (ref 31.5–35.7)
MCV: 83 fL (ref 79–97)
Monocytes Absolute: 0.1 10*3/uL (ref 0.1–0.9)
Monocytes: 5 %
Neutrophils Absolute: 1.9 10*3/uL (ref 1.4–7.0)
Neutrophils: 64 %
Platelets: 37 10*3/uL — CL (ref 150–450)
RBC: 3.97 x10E6/uL (ref 3.77–5.28)
RDW: 15.4 % (ref 12.3–15.4)
WBC: 2.9 10*3/uL — ABNORMAL LOW (ref 3.4–10.8)

## 2018-08-02 LAB — BASIC METABOLIC PANEL
ANION GAP: 8 (ref 5–15)
BUN: 11 mg/dL (ref 6–20)
CO2: 26 mmol/L (ref 22–32)
Calcium: 8.3 mg/dL — ABNORMAL LOW (ref 8.9–10.3)
Chloride: 107 mmol/L (ref 98–111)
Creatinine, Ser: 0.6 mg/dL (ref 0.44–1.00)
GFR calc non Af Amer: >60 mL/min (ref >60)
GLUCOSE: 104 mg/dL — AB (ref 70–99)
POTASSIUM: 2.9 mmol/L — AB (ref 3.5–5.1)
Sodium: 141 mmol/L (ref 135–145)

## 2018-08-02 LAB — IRON,TIBC AND FERRITIN PANEL
Ferritin: 7 ng/mL — ABNORMAL LOW (ref 15–150)
Iron Saturation: 7 % — CL (ref 15–55)
Iron: 29 ug/dL (ref 27–159)
Total Iron Binding Capacity: 436 ug/dL (ref 250–450)
UIBC: 407 ug/dL (ref 131–425)

## 2018-08-02 LAB — CBG MONITORING, ED: Glucose-Capillary: 104 mg/dL — ABNORMAL HIGH (ref 70–99)

## 2018-08-02 MED ORDER — SODIUM CHLORIDE 0.9 % IV SOLN
INTRAVENOUS | Status: DC | PRN
  Administered 2018-08-02: 250 mL via INTRAVENOUS

## 2018-08-02 MED ORDER — POTASSIUM CHLORIDE CRYS ER 20 MEQ PO TBCR
40.0000 meq | EXTENDED_RELEASE_TABLET | Freq: Once | ORAL | Status: AC
  Administered 2018-08-02: 40 meq via ORAL
  Filled 2018-08-02: qty 2

## 2018-08-02 MED ORDER — POTASSIUM CHLORIDE IN NACL 20-0.9 MEQ/L-% IV SOLN
Freq: Once | INTRAVENOUS | Status: AC
  Administered 2018-08-02: 13:00:00 via INTRAVENOUS
  Filled 2018-08-02: qty 1000

## 2018-08-02 MED ORDER — THIAMINE HCL 100 MG/ML IJ SOLN
Freq: Once | INTRAVENOUS | Status: AC
  Administered 2018-08-02: 09:00:00 via INTRAVENOUS
  Filled 2018-08-02: qty 1000

## 2018-08-02 MED ORDER — SODIUM CHLORIDE 0.9 % IV SOLN
510.0000 mg | Freq: Once | INTRAVENOUS | Status: AC
  Administered 2018-08-02: 510 mg via INTRAVENOUS
  Filled 2018-08-02: qty 17

## 2018-08-02 NOTE — Progress Notes (Signed)
Subjective: Patient presents today for iron infusion and fluid replacement. Patient with a hx of gastric bypass, anemia and fatigue. Patient is accompanied by her husband who reports a hx of blood transfusions in the past (2012).  Patient was seen in the ambulatory clinic on 07/27/2018 for complaints of fatigue, neuropathy and change in speech.Patient with mild pleural effusion noted.  Patient does not complain of SOB today in the day hospital today.   Objective: Vital signs in last 24 hours: Temp:  [98.1 F (36.7 C)] 98.1 F (36.7 C) (10/17 1002) Pulse Rate:  [61] 61 (10/17 0818) Resp:  [16-17] 17 (10/17 1002) BP: (115-130)/(48-69) 115/69 (10/17 1002) SpO2:  [100 %] 100 % (10/17 1002) Weight change:     Intake/Output from previous day: No intake/output data recorded. Intake/Output this shift: Total I/O In: 203.9 [I.V.:55.4; IV Piggyback:148.6] Out: -   General appearance: alert, cooperative, no distress and pale Head: Normocephalic, without obvious abnormality, atraumatic Resp: diminished breath sounds LLL and RLL Cardio: regular rate and rhythm, S1, S2 normal, no murmur, click, rub or gallop Neurologic: Alert and oriented X 3, normal strength and tone. Normal symmetric reflexes. Normal coordination and gait Patient c/o of sharp back pain. Was resolved with change of positions.   Lab Results: Recent Labs    08/01/18 1154  WBC 2.9*  HGB 10.3*  HCT 32.9*  PLT 37*   BMET Recent Labs    08/01/18 1154  NA 144  K 2.8*  CL 103  CO2 28  GLUCOSE 79  BUN 10  CREATININE 0.60  CALCIUM 8.1*    Studies/Results: No results found.  Medications: I have reviewed the patient's current medications.  Assessment/Plan: Patient with critical low platelet value, back pain that is resolving slowly. Patient advised to go to the ED. Offered EMS services. Husband advised that he will transport her due to cost.   LOS: 0 days   Lanae Boast 08/02/2018, 10:15 AM

## 2018-08-02 NOTE — Progress Notes (Signed)
PATIENT CARE CENTER NOTE   Provider: Lanae Boast, FNP   Procedure: Feraheme infusion and Banana Bag   Note: Patient received Feraheme infusion. Patient tolerated infusion well. Banana bag infusion started after Feraheme infusion. Around 30 minutes after start of Banana bag, patient reported sudden, severe pain in lower back. Infusion stopped and Venora Maples FNP notified and came to assess the patient. Patient's vital signs stable. Back pain began to resolve. Due to patient's critical platelet level and sudden severe back pain, the provider advised patient to go to the ED for further evaluation. Patient stable at discharge. Back pain resolved. Patient transported to the [facility] by husband.

## 2018-08-02 NOTE — ED Triage Notes (Signed)
Patient reports that she went to her PCP today. The patient received "a bag of vitamins" today. Patient was told to come to the ED because her Hgb was low.

## 2018-08-02 NOTE — ED Provider Notes (Signed)
[facility] DEPT Provider Note   CSN: [id number] Arrival date & time: 08/02/18  1031     History   Chief Complaint Chief Complaint  Patient presents with  . Abnormal Lab  . Weakness    HPI Barbara Stanley is a 47 y.o. female.  HPI Patient presents with concern of weakness. Patient has multiple medical issues, including anemia, COPD. She has been working with her physician on her persistent weakness, and today received a phone call that she had abnormal lab findings, with critical findings. Patient herself denies melena, hematemesis, focal pain, syncope, focal weakness, recent medication or diet changes.  When she has been receiving iron transfusions recently. Past Medical History:  Diagnosis Date  . Anemia   . COPD (chronic obstructive pulmonary disease) ([place])   . Depression   . Emphysema   . GERD (gastroesophageal reflux disease)   . Tobacco abuse     Patient Active Problem List   Diagnosis Date Noted  . MDD (major depressive disorder), single episode, severe with psychosis ([place]) 11/05/2014  . PTSD (post-traumatic stress disorder) 11/05/2014  . Depressive disorder 06/04/2014  . Anxiety disorder 06/04/2014  . Major depression, recurrent ([place]) 03/21/2014  . Suicide attempt ([place]) 03/21/2014  . Suicidal ideations 03/21/2014  . MDD (major depressive disorder) 03/21/2014  . Nonspecific abnormal unspecified cardiovascular function study 09/30/2013  . Tobacco use disorder 09/30/2013  . COPD (chronic obstructive pulmonary disease) ([place])   . Emphysema   . GERD (gastroesophageal reflux disease)   . Anemia     Past Surgical History:  Procedure Laterality Date  . ABDOMINAL HYSTERECTOMY    . c section x 3     . CHOLECYSTECTOMY    . GASTRIC BYPASS       OB History   None      Home Medications    Prior to Admission medications   Medication Sig Start Date End Date Taking? Authorizing Provider  albuterol (PROAIR HFA) 108 (90 Base)  MCG/ACT inhaler Inhale 2 puffs into the lungs every 6 (six) hours as needed for wheezing or shortness of breath. 08/07/17  Yes Dorena Dew, FNP  budesonide-formoterol (SYMBICORT) 160-4.5 MCG/ACT inhaler Inhale 2 puffs into the lungs 2 (two) times daily. 08/07/17  Yes Dorena Dew, FNP  esomeprazole (NEXIUM) 40 MG capsule TAKE ONE CAPSULE BY MOUTH DAILY 02/21/18  Yes Dorena Dew, FNP  methylPREDNISolone (MEDROL DOSEPAK) 4 MG TBPK tablet Take as directed on pack 07/27/18  Yes Lanae Boast, FNP  moxifloxacin (AVELOX) 400 MG tablet Take 1 tablet (400 mg total) by mouth daily. 07/27/18  Yes Lanae Boast, FNP  potassium chloride 20 MEQ TBCR Take 20 mEq by mouth daily. 07/30/18 08/29/18 Yes Lanae Boast, FNP  tiotropium (SPIRIVA) 18 MCG inhalation capsule Place 1 capsule (18 mcg total) daily into inhaler and inhale. 08/24/17  Yes Dorena Dew, FNP  venlafaxine XR (EFFEXOR-XR) 75 MG 24 hr capsule Take 1 capsule (75 mg total) by mouth daily with breakfast. For depression 11/08/14  Yes Rankin, Shuvon B, NP  benztropine (COGENTIN) 0.5 MG tablet Take 1 tablet (0.5 mg total) by mouth daily. For drug induced tremors Patient not taking: Reported on 07/27/2018 11/08/14   Rankin, Shuvon B, NP  haloperidol (HALDOL) 5 MG tablet Take 1 tablet (5 mg total) by mouth daily. For psychosis Patient not taking: Reported on 07/27/2018 11/08/14   Rankin, Shuvon B, NP  prazosin (MINIPRESS) 1 MG capsule Take 1 capsule (1 mg total) by mouth at bedtime. For  nightmares Patient not taking: Reported on 07/27/2018 11/08/14   Rankin, Shuvon B, NP  traZODone (DESYREL) 150 MG tablet Take 1 tablet (150 mg total) by mouth at bedtime. For sleep Patient not taking: Reported on 08/02/2018 11/08/14   Rankin, Delphia Grates B, NP    Family History Family History  Problem Relation Age of Onset  . Diabetes Mother   . Diabetes Other   . Hypertension Other   . CAD Other     Social History Social History   Tobacco Use  .  Smoking status: Current Every Day Smoker    Packs/day: 1.00    Years: 1.00    Pack years: 1.00    Types: Cigarettes  . Smokeless tobacco: Never Used  Substance Use Topics  . Alcohol use: No    Comment: occas  . Drug use: No     Allergies   Patient has no known allergies.   Review of Systems Review of Systems  Constitutional:       Per HPI, otherwise negative  HENT:       Per HPI, otherwise negative  Respiratory:       Per HPI, otherwise negative  Cardiovascular:       Per HPI, otherwise negative  Gastrointestinal: Negative for vomiting.  Endocrine:       Negative aside from HPI  Genitourinary:       Neg aside from HPI   Musculoskeletal:       Per HPI, otherwise negative  Skin: Negative.   Neurological: Positive for weakness. Negative for syncope.     Physical Exam Updated Vital Signs BP 137/71   Pulse 80   Temp 98.4 F (36.9 C)   Resp 16   Ht 5' (1.524 m)   Wt 97.7 kg   SpO2 (!) 87%   BMI 42.07 kg/m   Physical Exam  Constitutional: She is oriented to person, place, and time. She appears well-developed and well-nourished. No distress.  HENT:  Head: Normocephalic and atraumatic.  Eyes: Conjunctivae and EOM are normal.  Cardiovascular: Normal rate and regular rhythm.  Pulmonary/Chest: Effort normal and breath sounds normal. No stridor. No respiratory distress.  Abdominal: She exhibits no distension.  Musculoskeletal: She exhibits no edema.  Neurological: She is alert and oriented to person, place, and time. No cranial nerve deficit.  Skin: Skin is warm and dry.  Psychiatric: She has a normal mood and affect.  Nursing note and vitals reviewed.    ED Treatments / Results  Labs (all labs ordered are listed, but only abnormal results are displayed) Labs Reviewed  BASIC METABOLIC PANEL - Abnormal; Notable for the following components:      Result Value   Potassium 2.9 (*)    Glucose, Bld 104 (*)    Calcium 8.3 (*)    All other components within  normal limits  CBC - Abnormal; Notable for the following components:   Hemoglobin 10.3 (*)    HCT 35.3 (*)    MCH 25.9 (*)    MCHC 29.2 (*)    RDW 17.9 (*)    Platelets 32 (*)    All other components within normal limits  URINALYSIS, ROUTINE W REFLEX MICROSCOPIC - Abnormal; Notable for the following components:   Ketones, ur 5 (*)    All other components within normal limits  CBG MONITORING, ED - Abnormal; Notable for the following components:   Glucose-Capillary 104 (*)    All other components within normal limits    EKG EKG Interpretation  Date/Time:  Thursday August 02 2018 10:50:48 EDT Ventricular Rate:  62 PR Interval:    QRS Duration: 103 QT Interval:  467 QTC Calculation: 475 R Axis:   77 Text Interpretation:  Sinus rhythm Low voltage, precordial leads Baseline wander Abnormal ekg Confirmed by Carmin Muskrat [phone]) on 08/02/2018 1:05:32 PM   Radiology No results found.  Procedures Procedures (including critical care time)  Medications Ordered in ED Medications  potassium chloride SA (K-DUR,KLOR-CON) CR tablet 40 mEq (40 mEq Oral Given 08/02/18 1325)  0.9 % NaCl with KCl 20 mEq/ L  infusion ( Intravenous New Bag/Given 08/02/18 1327)     Initial Impression / Assessment and Plan / ED Course  I have reviewed the triage vital signs and the nursing notes.  Pertinent labs & imaging results that were available during my care of the patient were reviewed by me and considered in my medical decision making (see chart for details).     Update: Initial labs notable for hypokalemia, consistent with outpatient study.  3:43 PM Patient in no distress sitting upright speaking clearly. She is nearly completed her potassium repletion IV, has already received oral. With no new complaints, reassuring labs aside from hypokalemia, reassuring vital signs, the patient is appropriate for discharge, will follow up with her physician for repeat labs within the coming  days.   Final Clinical Impressions(s) / ED Diagnoses   Final diagnoses:  Hypokalemia      Carmin Muskrat, MD 08/02/18 1544

## 2018-08-02 NOTE — Discharge Instructions (Signed)
Ferumoxytol injection What is this medicine? FERUMOXYTOL is an iron complex. Iron is used to make healthy red blood cells, which carry oxygen and nutrients throughout the body. This medicine is used to treat iron deficiency anemia in people with chronic kidney disease. This medicine may be used for other purposes; ask your health care provider or pharmacist if you have questions. COMMON BRAND NAME(S): Feraheme What should I tell my health care provider before I take this medicine? They need to know if you have any of these conditions: -anemia not caused by low iron levels -high levels of iron in the blood -magnetic resonance imaging (MRI) test scheduled -an unusual or allergic reaction to iron, other medicines, foods, dyes, or preservatives -pregnant or trying to get pregnant -breast-feeding How should I use this medicine? This medicine is for injection into a vein. It is given by a health care professional in a hospital or clinic setting. Talk to your pediatrician regarding the use of this medicine in children. Special care may be needed. Overdosage: If you think you have taken too much of this medicine contact a poison control center or emergency room at once. NOTE: This medicine is only for you. Do not share this medicine with others. What if I miss a dose? It is important not to miss your dose. Call your doctor or health care professional if you are unable to keep an appointment. What may interact with this medicine? This medicine may interact with the following medications: -other iron products This list may not describe all possible interactions. Give your health care provider a list of all the medicines, herbs, non-prescription drugs, or dietary supplements you use. Also tell them if you smoke, drink alcohol, or use illegal drugs. Some items may interact with your medicine. What should I watch for while using this medicine? Visit your doctor or healthcare professional regularly. Tell  your doctor or healthcare professional if your symptoms do not start to get better or if they get worse. You may need blood work done while you are taking this medicine. You may need to follow a special diet. Talk to your doctor. Foods that contain iron include: whole grains/cereals, dried fruits, beans, or peas, leafy green vegetables, and organ meats (liver, kidney). What side effects may I notice from receiving this medicine? Side effects that you should report to your doctor or health care professional as soon as possible: -allergic reactions like skin rash, itching or hives, swelling of the face, lips, or tongue -breathing problems -changes in blood pressure -feeling faint or lightheaded, falls -fever or chills -flushing, sweating, or hot feelings -swelling of the ankles or feet Side effects that usually do not require medical attention (report to your doctor or health care professional if they continue or are bothersome): -diarrhea -headache -nausea, vomiting -stomach pain This list may not describe all possible side effects. Call your doctor for medical advice about side effects. You may report side effects to FDA at 1-800-FDA-1088. Where should I keep my medicine? This drug is given in a hospital or clinic and will not be stored at home. NOTE: This sheet is a summary. It may not cover all possible information. If you have questions about this medicine, talk to your doctor, pharmacist, or health care provider.  2018 Elsevier/Gold Standard (2015-11-05 12:41:49)  Today you received your second  Banana bag infusion (sodium chloride 0.9 % 1,000 mL with thiamine 176 mg, folic acid 1 mg, multivitamins adult 10 mL, potassium chloride 10 mEq infusion).

## 2018-08-02 NOTE — Discharge Instructions (Addendum)
Please be sure to schedule follow-up with your physician on Monday to ensure that your condition improves. Return here for concerning changes.

## 2018-08-02 NOTE — ED Notes (Signed)
Patient can eat and drink, per MD.

## 2018-08-05 ENCOUNTER — Other Ambulatory Visit: Payer: Self-pay | Admitting: Family Medicine

## 2018-08-05 DIAGNOSIS — D696 Thrombocytopenia, unspecified: Secondary | ICD-10-CM

## 2018-08-05 DIAGNOSIS — Z862 Personal history of diseases of the blood and blood-forming organs and certain disorders involving the immune mechanism: Secondary | ICD-10-CM

## 2018-08-05 DIAGNOSIS — Z9884 Bariatric surgery status: Secondary | ICD-10-CM

## 2018-08-05 LAB — IRON,TIBC AND FERRITIN PANEL
Ferritin: 7 ng/mL — ABNORMAL LOW (ref 15–150)
Iron Saturation: 7 % — CL (ref 15–55)
Iron: 32 ug/dL (ref 27–159)
Total Iron Binding Capacity: 449 ug/dL (ref 250–450)
UIBC: 417 ug/dL (ref 131–425)

## 2018-08-05 LAB — CBC WITH DIFFERENTIAL/PLATELET
Basophils Absolute: 0 10*3/uL (ref 0.0–0.2)
Basos: 1 %
EOS (ABSOLUTE): 0 10*3/uL (ref 0.0–0.4)
Eos: 1 %
Hematocrit: 35 % (ref 34.0–46.6)
Hemoglobin: 10.8 g/dL — ABNORMAL LOW (ref 11.1–15.9)
Immature Grans (Abs): 0 10*3/uL (ref 0.0–0.1)
Immature Granulocytes: 0 %
Lymphocytes Absolute: 0.8 10*3/uL (ref 0.7–3.1)
Lymphs: 23 %
MCH: 26 pg — ABNORMAL LOW (ref 26.6–33.0)
MCHC: 30.9 g/dL — ABNORMAL LOW (ref 31.5–35.7)
MCV: 84 fL (ref 79–97)
Monocytes Absolute: 0.2 10*3/uL (ref 0.1–0.9)
Monocytes: 5 %
Neutrophils Absolute: 2.5 10*3/uL (ref 1.4–7.0)
Neutrophils: 70 %
Platelets: 38 10*3/uL — CL (ref 150–450)
RBC: 4.16 x10E6/uL (ref 3.77–5.28)
RDW: 15.8 % — ABNORMAL HIGH (ref 12.3–15.4)
WBC: 3.5 10*3/uL (ref 3.4–10.8)

## 2018-08-05 LAB — FOLATE: Folate: 2.7 ng/mL — ABNORMAL LOW (ref >3.0)

## 2018-08-05 LAB — COMPREHENSIVE METABOLIC PANEL
ALT: 14 IU/L (ref 0–32)
AST: 20 IU/L (ref 0–40)
Albumin/Globulin Ratio: 1.8 (ref 1.2–2.2)
Albumin: 3.3 g/dL — ABNORMAL LOW (ref 3.5–5.5)
Alkaline Phosphatase: 102 IU/L (ref 39–117)
BUN/Creatinine Ratio: 11 (ref 9–23)
BUN: 7 mg/dL (ref 6–24)
Bilirubin Total: 0.5 mg/dL (ref 0.0–1.2)
CO2: 26 mmol/L (ref 20–29)
Calcium: 8.2 mg/dL — ABNORMAL LOW (ref 8.7–10.2)
Chloride: 101 mmol/L (ref 96–106)
Creatinine, Ser: 0.62 mg/dL (ref 0.57–1.00)
GFR calc Af Amer: 124 mL/min/{1.73_m2} (ref >59)
GFR calc non Af Amer: 108 mL/min/{1.73_m2} (ref >59)
Globulin, Total: 1.8 g/dL (ref 1.5–4.5)
Glucose: 83 mg/dL (ref 65–99)
Potassium: 2.9 mmol/L — ABNORMAL LOW (ref 3.5–5.2)
Sodium: 142 mmol/L (ref 134–144)
Total Protein: 5.1 g/dL — ABNORMAL LOW (ref 6.0–8.5)

## 2018-08-05 LAB — COPPER, SERUM: Copper: 67 ug/dL — ABNORMAL LOW (ref 72–166)

## 2018-08-05 LAB — HIV ANTIBODY (ROUTINE TESTING W REFLEX): HIV Screen 4th Generation wRfx: NONREACTIVE

## 2018-08-05 LAB — MAGNESIUM: Magnesium: 1.8 mg/dL (ref 1.6–2.3)

## 2018-08-05 LAB — VITAMIN B1: Thiamine: 77.2 nmol/L (ref 66.5–200.0)

## 2018-08-05 LAB — VITAMIN B6: Vitamin B6: 1.7 ug/L — ABNORMAL LOW (ref 2.0–32.8)

## 2018-08-05 LAB — VITAMIN B12: Vitamin B-12: 380 pg/mL (ref 232–1245)

## 2018-08-05 NOTE — Progress Notes (Signed)
Referred to heme for further eval

## 2018-08-06 ENCOUNTER — Ambulatory Visit (HOSPITAL_COMMUNITY)
Admission: RE | Admit: 2018-08-06 | Discharge: 2018-08-06 | Disposition: A | Discharge status: Home / Self Care | Payer: Medicaid Other | Source: Ambulatory Visit | Attending: Family Medicine | Admitting: Family Medicine

## 2018-08-06 ENCOUNTER — Encounter: Payer: Self-pay | Admitting: Family Medicine

## 2018-08-06 ENCOUNTER — Ambulatory Visit (INDEPENDENT_AMBULATORY_CARE_PROVIDER_SITE_OTHER): Payer: Medicaid Other | Admitting: Family Medicine

## 2018-08-06 VITALS — BP 116/63 | HR 77 | Temp 98.6°F | Resp 16 | Ht 68.0 in | Wt 218.0 lb

## 2018-08-06 DIAGNOSIS — F17200 Nicotine dependence, unspecified, uncomplicated: Secondary | ICD-10-CM | POA: Diagnosis not present

## 2018-08-06 DIAGNOSIS — E46 Unspecified protein-calorie malnutrition: Secondary | ICD-10-CM

## 2018-08-06 DIAGNOSIS — J449 Chronic obstructive pulmonary disease, unspecified: Secondary | ICD-10-CM

## 2018-08-06 DIAGNOSIS — D649 Anemia, unspecified: Secondary | ICD-10-CM

## 2018-08-06 LAB — FERRITIN: Ferritin: 78 ng/mL (ref 11–307)

## 2018-08-06 LAB — COMPREHENSIVE METABOLIC PANEL
ALT: 25 U/L (ref 0–44)
AST: 39 U/L (ref 15–41)
Albumin: 3.1 g/dL — ABNORMAL LOW (ref 3.5–5.0)
Alkaline Phosphatase: 88 U/L (ref 38–126)
Anion gap: 9 (ref 5–15)
BUN: 10 mg/dL (ref 6–20)
CO2: 24 mmol/L (ref 22–32)
Calcium: 7.9 mg/dL — ABNORMAL LOW (ref 8.9–10.3)
Chloride: 110 mmol/L (ref 98–111)
Creatinine, Ser: 0.52 mg/dL (ref 0.44–1.00)
GFR calc Af Amer: >60 mL/min (ref >60)
GFR calc non Af Amer: >60 mL/min (ref >60)
Glucose, Bld: 102 mg/dL — ABNORMAL HIGH (ref 70–99)
Potassium: 3 mmol/L — ABNORMAL LOW (ref 3.5–5.1)
Sodium: 143 mmol/L (ref 135–145)
Total Bilirubin: 0.8 mg/dL (ref 0.3–1.2)
Total Protein: 5.5 g/dL — ABNORMAL LOW (ref 6.5–8.1)

## 2018-08-06 LAB — CBC WITH DIFFERENTIAL/PLATELET
Abs Immature Granulocytes: 0.31 10*3/uL — ABNORMAL HIGH (ref 0.00–0.07)
Basophils Absolute: 0 10*3/uL (ref 0.0–0.1)
Basophils Relative: 1 %
Eosinophils Absolute: 0 10*3/uL (ref 0.0–0.5)
Eosinophils Relative: 0 %
HCT: 41.5 % (ref 36.0–46.0)
Hemoglobin: 12.1 g/dL (ref 12.0–15.0)
Immature Granulocytes: 11 %
Lymphocytes Relative: 48 %
Lymphs Abs: 1.3 10*3/uL (ref 0.7–4.0)
MCH: 26.7 pg (ref 26.0–34.0)
MCHC: 29.2 g/dL — ABNORMAL LOW (ref 30.0–36.0)
MCV: 91.6 fL (ref 80.0–100.0)
Monocytes Absolute: 0.1 10*3/uL (ref 0.1–1.0)
Monocytes Relative: 4 %
Neutro Abs: 1 10*3/uL — ABNORMAL LOW (ref 1.7–7.7)
Neutrophils Relative %: 36 %
Platelets: 34 10*3/uL — ABNORMAL LOW (ref 150–400)
RBC: 4.53 MIL/uL (ref 3.87–5.11)
RDW: 20 % — ABNORMAL HIGH (ref 11.5–15.5)
WBC: 2.7 10*3/uL — ABNORMAL LOW (ref 4.0–10.5)
nRBC: 3.3 % — ABNORMAL HIGH (ref 0.0–0.2)

## 2018-08-06 MED ORDER — THIAMINE HCL 100 MG/ML IJ SOLN
Freq: Once | INTRAVENOUS | Status: AC
  Administered 2018-08-06: 10:00:00 via INTRAVENOUS
  Filled 2018-08-06: qty 1000

## 2018-08-06 MED ORDER — SODIUM CHLORIDE 0.9 % IV SOLN
Freq: Once | INTRAVENOUS | Status: AC
  Administered 2018-08-06: 11:00:00 via INTRAVENOUS

## 2018-08-06 MED ORDER — METHYLPREDNISOLONE SODIUM SUCC 125 MG IJ SOLR
125.0000 mg | Freq: Once | INTRAMUSCULAR | Status: AC
  Administered 2018-08-06: 125 mg via INTRAVENOUS
  Filled 2018-08-06: qty 2

## 2018-08-06 MED ORDER — IPRATROPIUM-ALBUTEROL 0.5-2.5 (3) MG/3ML IN SOLN
3.0000 mL | Freq: Once | RESPIRATORY_TRACT | Status: AC
  Administered 2018-08-06: 3 mL via RESPIRATORY_TRACT
  Filled 2018-08-06: qty 3

## 2018-08-06 MED ORDER — IPRATROPIUM-ALBUTEROL 0.5-2.5 (3) MG/3ML IN SOLN: 3.0000 mL | Freq: Once | RESPIRATORY_TRACT | Status: DC

## 2018-08-06 NOTE — Progress Notes (Signed)
Patient complaint of shortness of breath, wheezing, O2 sat 93%, pulse 135, IV banana bag stopped, provider notified, orders received.

## 2018-08-06 NOTE — Progress Notes (Signed)
Vital signs stable.  Patient stated she feels much better.  Shortness of breath and wheezing subsided after stopping banana bag and administering IV Solumedrol and Duoneb.  IV NS 1L administered over 2 hours.  Provider advised for patient to return in 2 days for lab recheck and has placed a hematology consult.  Patient ambulatory at time of discharge.

## 2018-08-06 NOTE — Progress Notes (Addendum)
Patient [facility]   Progress Note: General Provider: Lanae Boast, FNP  SUBJECTIVE:   Allisen Pidgeon is a 47 y.o. female who  has a past medical history of Anemia, COPD (chronic obstructive pulmonary disease) ([place]), Depression, Emphysema, GERD (gastroesophageal reflux disease), and Tobacco abuse.. Patient presents today for Follow-up (er follow up for low potassium ) Patient presents for ED follow up. Patient was given potassium in the ED. Continues to have low platelets. Patient is currently seen at the [facility] for her depression and anxiety. She states that she feels mildly better. Not as fatigued. Denies bleeding from the rectum, vaginal bleeding, hempotysis, or any other blood loss. Patient states that she has a history of anemia and has received blood transfusions in the past. Review of Systems  Constitutional: Positive for malaise/fatigue.  HENT: Negative.   Eyes: Negative.   Respiratory: Negative.   Cardiovascular: Negative.   Gastrointestinal: Negative.   Genitourinary: Negative.   Musculoskeletal: Negative.   Skin: Negative.   Neurological: Negative.   Psychiatric/Behavioral: Negative.      OBJECTIVE: BP 116/63 (BP Location: Left Arm, Patient Position: Sitting, Cuff Size: Normal)   Pulse 77   Temp 98.6 F (37 C) (Oral)   Resp 16   Ht 5\' 8"  (1.727 m)   Wt 218 lb (98.9 kg)   SpO2 97%   BMI 33.15 kg/m   Physical Exam  Constitutional: She is oriented to person, place, and time. She appears well-developed and well-nourished. No distress.  HENT:  Head: Normocephalic and atraumatic.  Eyes: Pupils are equal, round, and reactive to light. Conjunctivae and EOM are normal.  Neck: Normal range of motion.  Cardiovascular: Normal rate, regular rhythm, normal heart sounds and intact distal pulses.  Pulmonary/Chest: Effort normal and breath sounds normal. No respiratory distress.  Abdominal: Soft. Bowel sounds are  normal. She exhibits no distension.  Musculoskeletal: Normal range of motion.  Neurological: She is alert and oriented to person, place, and time.  Skin: Skin is warm and dry.  Psychiatric: She has a normal mood and affect. Her behavior is normal. Thought content normal.  Nursing note and vitals reviewed.   ASSESSMENT/PLAN:  1. Malnutrition, unspecified type ([place]) Patient transferred to the day hospital at the [facility] for infusion of a banana bag.  Once the back was started, patient developed shortness of breath, tachycardia, and wheezing.  Banana bag stopped.  Patient given IV Solu-Medrol 125 and changed to normal saline.  Patient with resolution of shortness of breath.  EKG with normal sinus rhythm low voltage QRS.  Patient advised to continue oral potassium and will return to clinic in 2 days for lab draw.  2. Chronic obstructive pulmonary disease, unspecified COPD type ([place]) Patient to continue with albuterol and other respiratory medications.  She has been advised to quit smoking  3. Tobacco use disorder Patient not interested in smoking cessation at the present time.  We will continue to educate on the dangers of smoking and chronic illness such as anemia and hypertension.  4. Anemia, unspecified type Patient referred to hematologist for further evaluation of anemia. Discussed that Effexor and other SSRIs/ SNRIs can contribute to low platelets. Patient advised to contact her psychiatrist to discuss possible alternatives.          The patient was given clear instructions to go to ER or return to medical center if symptoms do not improve, worsen or new problems develop. The patient verbalized understanding and agreed  with plan of care.   [name], FNP-BC Patient [facility] [address] [place], [place] [address] [phone]     This note has been created with Dragon speech recognition software and smart phrase  technology. Any transcriptional errors are unintentional.

## 2018-08-07 ENCOUNTER — Encounter: Payer: Self-pay | Admitting: Gastroenterology

## 2018-08-08 ENCOUNTER — Encounter: Payer: Self-pay | Admitting: Family Medicine

## 2018-08-08 ENCOUNTER — Other Ambulatory Visit: Payer: Self-pay

## 2018-08-08 ENCOUNTER — Encounter: Payer: Self-pay | Admitting: Hematology and Oncology

## 2018-08-08 ENCOUNTER — Inpatient Hospital Stay (HOSPITAL_COMMUNITY)
Admission: EM | Admit: 2018-08-08 | Discharge: 2018-08-12 | DRG: 641 | Disposition: A | Discharge status: Home Health | Payer: Medicaid Other | Attending: Internal Medicine | Admitting: Internal Medicine

## 2018-08-08 ENCOUNTER — Telehealth: Payer: Self-pay | Admitting: Hematology and Oncology

## 2018-08-08 ENCOUNTER — Encounter (HOSPITAL_COMMUNITY): Payer: Self-pay | Admitting: Emergency Medicine

## 2018-08-08 ENCOUNTER — Ambulatory Visit (HOSPITAL_COMMUNITY)
Admission: RE | Admit: 2018-08-08 | Discharge: 2018-08-08 | Disposition: A | Discharge status: Home / Self Care | Payer: Medicaid Other | Source: Ambulatory Visit | Attending: Family Medicine | Admitting: Family Medicine

## 2018-08-08 DIAGNOSIS — D696 Thrombocytopenia, unspecified: Secondary | ICD-10-CM

## 2018-08-08 DIAGNOSIS — R9431 Abnormal electrocardiogram [ECG] [EKG]: Secondary | ICD-10-CM

## 2018-08-08 DIAGNOSIS — Z888 Allergy status to other drugs, medicaments and biological substances status: Secondary | ICD-10-CM

## 2018-08-08 DIAGNOSIS — F17210 Nicotine dependence, cigarettes, uncomplicated: Secondary | ICD-10-CM | POA: Diagnosis present

## 2018-08-08 DIAGNOSIS — J449 Chronic obstructive pulmonary disease, unspecified: Secondary | ICD-10-CM | POA: Diagnosis present

## 2018-08-08 DIAGNOSIS — Z7951 Long term (current) use of inhaled steroids: Secondary | ICD-10-CM

## 2018-08-08 DIAGNOSIS — Z9884 Bariatric surgery status: Secondary | ICD-10-CM

## 2018-08-08 DIAGNOSIS — D61818 Other pancytopenia: Secondary | ICD-10-CM | POA: Diagnosis present

## 2018-08-08 DIAGNOSIS — R945 Abnormal results of liver function studies: Secondary | ICD-10-CM

## 2018-08-08 DIAGNOSIS — F323 Major depressive disorder, single episode, severe with psychotic features: Secondary | ICD-10-CM | POA: Diagnosis present

## 2018-08-08 DIAGNOSIS — Z79899 Other long term (current) drug therapy: Secondary | ICD-10-CM

## 2018-08-08 DIAGNOSIS — R7989 Other specified abnormal findings of blood chemistry: Secondary | ICD-10-CM

## 2018-08-08 DIAGNOSIS — K219 Gastro-esophageal reflux disease without esophagitis: Secondary | ICD-10-CM | POA: Diagnosis present

## 2018-08-08 DIAGNOSIS — E876 Hypokalemia: Principal | ICD-10-CM | POA: Diagnosis present

## 2018-08-08 DIAGNOSIS — R197 Diarrhea, unspecified: Secondary | ICD-10-CM | POA: Diagnosis present

## 2018-08-08 LAB — CBC WITH DIFFERENTIAL/PLATELET
Abs Immature Granulocytes: 0.08 10*3/uL — ABNORMAL HIGH (ref 0.00–0.07)
Basophils Absolute: 0 10*3/uL (ref 0.0–0.1)
Basophils Relative: 1 %
Eosinophils Absolute: 0 10*3/uL (ref 0.0–0.5)
Eosinophils Relative: 1 %
HCT: 38.1 % (ref 36.0–46.0)
Hemoglobin: 11.1 g/dL — ABNORMAL LOW (ref 12.0–15.0)
Immature Granulocytes: 1 %
Lymphocytes Relative: 29 %
Lymphs Abs: 1.6 10*3/uL (ref 0.7–4.0)
MCH: 26.9 pg (ref 26.0–34.0)
MCHC: 29.1 g/dL — ABNORMAL LOW (ref 30.0–36.0)
MCV: 92.5 fL (ref 80.0–100.0)
Monocytes Absolute: 0.3 10*3/uL (ref 0.1–1.0)
Monocytes Relative: 5 %
Neutro Abs: 3.5 10*3/uL (ref 1.7–7.7)
Neutrophils Relative %: 63 %
Platelets: 40 10*3/uL — ABNORMAL LOW (ref 150–400)
RBC: 4.12 MIL/uL (ref 3.87–5.11)
RDW: 20.8 % — ABNORMAL HIGH (ref 11.5–15.5)
WBC: 5.6 10*3/uL (ref 4.0–10.5)
nRBC: 0 % (ref 0.0–0.2)

## 2018-08-08 LAB — COMPREHENSIVE METABOLIC PANEL
ALT: 51 U/L — ABNORMAL HIGH (ref 0–44)
AST: 57 U/L — ABNORMAL HIGH (ref 15–41)
Albumin: 3 g/dL — ABNORMAL LOW (ref 3.5–5.0)
Alkaline Phosphatase: 76 U/L (ref 38–126)
Anion gap: 7 (ref 5–15)
BUN: 9 mg/dL (ref 6–20)
CO2: 29 mmol/L (ref 22–32)
Calcium: 7.7 mg/dL — ABNORMAL LOW (ref 8.9–10.3)
Chloride: 106 mmol/L (ref 98–111)
Creatinine, Ser: 0.68 mg/dL (ref 0.44–1.00)
GFR calc Af Amer: >60 mL/min (ref >60)
GFR calc non Af Amer: >60 mL/min (ref >60)
Glucose, Bld: 83 mg/dL (ref 70–99)
Potassium: 2.9 mmol/L — ABNORMAL LOW (ref 3.5–5.1)
Sodium: 142 mmol/L (ref 135–145)
Total Bilirubin: 0.9 mg/dL (ref 0.3–1.2)
Total Protein: 5.4 g/dL — ABNORMAL LOW (ref 6.5–8.1)

## 2018-08-08 LAB — PHOSPHORUS
Phosphorus: 3.8 mg/dL (ref 2.5–4.6)
Phosphorus: 3.6 mg/dL (ref 2.5–4.6)

## 2018-08-08 LAB — C DIFFICILE QUICK SCREEN W PCR REFLEX
C DIFFICILE (CDIFF) TOXIN: NEGATIVE
C DIFFICLE (CDIFF) ANTIGEN: NEGATIVE
C Diff interpretation: NOT DETECTED

## 2018-08-08 LAB — TSH: TSH: 1.773 u[IU]/mL (ref 0.350–4.500)

## 2018-08-08 LAB — MAGNESIUM: Magnesium: 1.9 mg/dL (ref 1.7–2.4)

## 2018-08-08 MED ORDER — PRAZOSIN HCL 1 MG PO CAPS
1.0000 mg | ORAL_CAPSULE | Freq: Every day | ORAL | Status: DC
  Administered 2018-08-08 – 2018-08-11 (×4): 1 mg via ORAL
  Filled 2018-08-08 (×4): qty 1

## 2018-08-08 MED ORDER — SODIUM CHLORIDE 0.9 % IV BOLUS: 1000.0000 mL | Freq: Once | INTRAVENOUS | Status: DC

## 2018-08-08 MED ORDER — BENZTROPINE MESYLATE 0.5 MG PO TABS
0.5000 mg | ORAL_TABLET | Freq: Every day | ORAL | Status: DC
  Administered 2018-08-09 – 2018-08-12 (×4): 0.5 mg via ORAL
  Filled 2018-08-08 (×4): qty 1

## 2018-08-08 MED ORDER — POTASSIUM CHLORIDE CRYS ER 20 MEQ PO TBCR
40.0000 meq | EXTENDED_RELEASE_TABLET | Freq: Once | ORAL | Status: AC
  Administered 2018-08-08: 40 meq via ORAL
  Filled 2018-08-08: qty 2

## 2018-08-08 MED ORDER — POTASSIUM CHLORIDE CRYS ER 20 MEQ PO TBCR: 20.0000 meq | EXTENDED_RELEASE_TABLET | Freq: Every day | ORAL | Status: DC

## 2018-08-08 MED ORDER — MOMETASONE FURO-FORMOTEROL FUM 200-5 MCG/ACT IN AERO
2.0000 | INHALATION_SPRAY | Freq: Two times a day (BID) | RESPIRATORY_TRACT | Status: DC
  Administered 2018-08-08 – 2018-08-12 (×8): 2 via RESPIRATORY_TRACT
  Filled 2018-08-08: qty 8.8

## 2018-08-08 MED ORDER — SODIUM CHLORIDE 0.9 % IV BOLUS
1000.0000 mL | Freq: Once | INTRAVENOUS | Status: AC
  Administered 2018-08-08: 1000 mL via INTRAVENOUS

## 2018-08-08 MED ORDER — POTASSIUM CHLORIDE CRYS ER 20 MEQ PO TBCR
40.0000 meq | EXTENDED_RELEASE_TABLET | Freq: Two times a day (BID) | ORAL | Status: AC
  Administered 2018-08-08 – 2018-08-09 (×2): 40 meq via ORAL
  Filled 2018-08-08 (×2): qty 2

## 2018-08-08 MED ORDER — ALBUTEROL SULFATE (2.5 MG/3ML) 0.083% IN NEBU: 2.5000 mg | INHALATION_SOLUTION | Freq: Four times a day (QID) | RESPIRATORY_TRACT | Status: DC | PRN

## 2018-08-08 MED ORDER — POTASSIUM CHLORIDE 10 MEQ/100ML IV SOLN
10.0000 meq | INTRAVENOUS | Status: AC
  Administered 2018-08-08 (×2): 10 meq via INTRAVENOUS
  Filled 2018-08-08 (×2): qty 100

## 2018-08-08 MED ORDER — MAGNESIUM SULFATE 4 GM/100ML IV SOLN
4.0000 g | Freq: Once | INTRAVENOUS | Status: AC
  Administered 2018-08-08: 4 g via INTRAVENOUS
  Filled 2018-08-08: qty 100

## 2018-08-08 MED ORDER — ONDANSETRON HCL 4 MG/2ML IJ SOLN: 4.0000 mg | Freq: Four times a day (QID) | INTRAMUSCULAR | Status: DC | PRN

## 2018-08-08 MED ORDER — TRAZODONE HCL 50 MG PO TABS
150.0000 mg | ORAL_TABLET | Freq: Every evening | ORAL | Status: DC | PRN
  Administered 2018-08-11: 150 mg via ORAL
  Filled 2018-08-08: qty 1

## 2018-08-08 MED ORDER — ALBUTEROL SULFATE HFA 108 (90 BASE) MCG/ACT IN AERS: 2.0000 | INHALATION_SPRAY | Freq: Four times a day (QID) | RESPIRATORY_TRACT | Status: DC | PRN

## 2018-08-08 MED ORDER — TRAZODONE HCL 50 MG PO TABS
150.0000 mg | ORAL_TABLET | Freq: Every day | ORAL | Status: DC
  Administered 2018-08-08 – 2018-08-11 (×4): 150 mg via ORAL
  Filled 2018-08-08 (×4): qty 1

## 2018-08-08 MED ORDER — ACETAMINOPHEN 500 MG PO TABS: 1000.0000 mg | ORAL_TABLET | Freq: Every day | ORAL | Status: DC | PRN

## 2018-08-08 MED ORDER — HALOPERIDOL 5 MG PO TABS
5.0000 mg | ORAL_TABLET | Freq: Every day | ORAL | Status: DC
  Administered 2018-08-09 – 2018-08-12 (×4): 5 mg via ORAL
  Filled 2018-08-08 (×4): qty 1

## 2018-08-08 MED ORDER — POTASSIUM CHLORIDE IN NACL 40-0.9 MEQ/L-% IV SOLN
INTRAVENOUS | Status: DC
  Administered 2018-08-08 – 2018-08-09 (×2): 100 mL/h via INTRAVENOUS
  Filled 2018-08-08 (×4): qty 1000

## 2018-08-08 MED ORDER — VENLAFAXINE HCL ER 75 MG PO CP24
75.0000 mg | ORAL_CAPSULE | Freq: Every day | ORAL | Status: DC
  Administered 2018-08-09 – 2018-08-11 (×3): 75 mg via ORAL
  Filled 2018-08-08 (×3): qty 1

## 2018-08-08 MED ORDER — ONDANSETRON HCL 4 MG PO TABS: 4.0000 mg | ORAL_TABLET | Freq: Four times a day (QID) | ORAL | Status: DC | PRN

## 2018-08-08 MED ORDER — POTASSIUM CHLORIDE ER 20 MEQ PO TBCR: 20.0000 meq | EXTENDED_RELEASE_TABLET | Freq: Every day | ORAL | Status: DC

## 2018-08-08 MED ORDER — TIOTROPIUM BROMIDE MONOHYDRATE 18 MCG IN CAPS
18.0000 ug | ORAL_CAPSULE | Freq: Every day | RESPIRATORY_TRACT | Status: DC
  Administered 2018-08-09 – 2018-08-12 (×4): 18 ug via RESPIRATORY_TRACT
  Filled 2018-08-08: qty 5

## 2018-08-08 MED ORDER — TRAMADOL HCL 50 MG PO TABS: 50.0000 mg | ORAL_TABLET | Freq: Four times a day (QID) | ORAL | Status: DC | PRN

## 2018-08-08 MED ORDER — SODIUM CHLORIDE 0.9 % IV SOLN
INTRAVENOUS | Status: DC
  Filled 2018-08-08 (×2): qty 1000

## 2018-08-08 NOTE — H&P (Signed)
History and Physical    [name] QMG:[id number] DOB: 13-Feb-1971 DOA: 08/08/2018  PCP: Lanae Boast, FNP  Patient coming from: home  Chief Complaint: Abnormal labs  HPI: Barbara Stanley is a 47 y.o. female with medical history significant of past medical history of major depression ongoing tobacco abuse who was seen by her primary care doctor for low potassium and was sent to the ED.  The patient has a history of gastric bypass has been feeling weak for approximately 3 weeks with ongoing diarrhea, she has been to the ED several times for diarrhea and low potassium has had her potassium was repleted and has been sent home, was sent to today from the PCPs office for low potassium.,  As per patient has been on antibiotics for pneumonia, she reports that her diarrhea began prior to this, there is no history of travel no sick contacts  ED Course:  She was found to be with a heart rate 56-72 blood pressure 120/81, with a potassium of 2.9 mildly elevated AST and ALT , And her platelet count is 40 alkaline phosphatase 76 total bilirubin 0.9, her white count is 5.6 her hemoglobin is 11.1 which is basically improved from previous  Review of Systems: As per HPI otherwise 10 point review of systems negative.    Past Medical History:  Diagnosis Date  . Anemia   . COPD (chronic obstructive pulmonary disease) ([place])   . Depression   . Emphysema   . GERD (gastroesophageal reflux disease)   . Tobacco abuse     Past Surgical History:  Procedure Laterality Date  . ABDOMINAL HYSTERECTOMY    . c section x 3     . CHOLECYSTECTOMY    . GASTRIC BYPASS       reports that she has been smoking cigarettes. She has a 1.00 pack-year smoking history. She has never used smokeless tobacco. She reports that she does not drink alcohol or use drugs.  Allergies  Allergen Reactions  . Other     "Banana Bag" given to patient at 14 office    Family History  Problem Relation Age of Onset  . Diabetes  Mother   . Diabetes Other   . Hypertension Other   . CAD Other     Prior to Admission medications   Medication Sig Start Date End Date Taking? Authorizing Provider  acetaminophen (TYLENOL) 500 MG tablet Take 1,000 mg by mouth daily as needed for moderate pain.   Yes [provider]  albuterol (PROAIR HFA) 108 (90 Base) MCG/ACT inhaler Inhale 2 puffs into the lungs every 6 (six) hours as needed for wheezing or shortness of breath. 08/07/17  Yes Dorena Dew, FNP  budesonide-formoterol (SYMBICORT) 160-4.5 MCG/ACT inhaler Inhale 2 puffs into the lungs 2 (two) times daily. 08/07/17  Yes Dorena Dew, FNP  esomeprazole (NEXIUM) 40 MG capsule TAKE ONE CAPSULE BY MOUTH DAILY 02/21/18  Yes Dorena Dew, FNP  potassium chloride 20 MEQ TBCR Take 20 mEq by mouth daily. 07/30/18 08/29/18 Yes Lanae Boast, FNP  tiotropium (SPIRIVA) 18 MCG inhalation capsule Place 1 capsule (18 mcg total) daily into inhaler and inhale. 08/24/17  Yes Dorena Dew, FNP  traZODone (DESYREL) 150 MG tablet Take 1 tablet (150 mg total) by mouth at bedtime. For sleep 11/08/14  Yes Rankin, Shuvon B, NP  venlafaxine XR (EFFEXOR-XR) 75 MG 24 hr capsule Take 1 capsule (75 mg total) by mouth daily with breakfast. For depression 11/08/14  Yes Rankin, Shuvon B, NP  benztropine (COGENTIN) 0.5 MG tablet Take 1 tablet (0.5 mg total) by mouth daily. For drug induced tremors Patient not taking: Reported on 07/27/2018 11/08/14   Rankin, Shuvon B, NP  haloperidol (HALDOL) 5 MG tablet Take 1 tablet (5 mg total) by mouth daily. For psychosis Patient not taking: Reported on 08/08/2018 11/08/14   Rankin, Shuvon B, NP  methylPREDNISolone (MEDROL DOSEPAK) 4 MG TBPK tablet Take as directed on pack Patient not taking: Reported on 08/08/2018 07/27/18   Lanae Boast, FNP  moxifloxacin (AVELOX) 400 MG tablet Take 1 tablet (400 mg total) by mouth daily. Patient not taking: Reported on 08/08/2018 07/27/18   Lanae Boast, FNP    prazosin (MINIPRESS) 1 MG capsule Take 1 capsule (1 mg total) by mouth at bedtime. For nightmares Patient not taking: Reported on 08/08/2018 11/08/14   Earleen Newport B, NP    Physical Exam: Vitals:   08/08/18 1435 08/08/18 1625  BP: 125/81 120/78  Pulse: 72 (!) 57  Resp: 20 18  Temp: 98.4 F (36.9 C)   TempSrc: Oral   SpO2: 97% 100%    Constitutional: NAD, calm, comfortable Vitals:   08/08/18 1435 08/08/18 1625  BP: 125/81 120/78  Pulse: 72 (!) 57  Resp: 20 18  Temp: 98.4 F (36.9 C)   TempSrc: Oral   SpO2: 97% 100%   Eyes: PERRL, lids and conjunctivae normal ENMT: Mucous membranes are moist. Posterior pharynx clear of any exudate or lesions.Normal dentition.  Neck: normal, supple, no masses, no thyromegaly Respiratory: clear to auscultation bilaterally, no wheezing, no crackles. Normal respiratory effort. No accessory muscle use.  Cardiovascular: Regular rate and rhythm, no murmurs / rubs / gallops. No extremity edema. 2+ pedal pulses. No carotid bruits.  Abdomen: Positive bowel sounds, soft nontender nondistended Musculoskeletal: no clubbing / cyanosis. No joint deformity upper and lower extremities. Good ROM, no contractures. Normal muscle tone.  Skin: no rashes, lesions, ulcers. No induration Neurologic: 3-12 are grossly intact sensation is intact throughout muscle strength is 5 5 in all 4 extremities deep tendon reflexes are 2+ bilaterally she has an uncontrollable mouth Psychiatric: Normal judgment and insight. Alert and oriented x 3. Normal mood.    Labs on Admission: I have personally reviewed following labs and imaging studies  CBC: Recent Labs  Lab 08/02/18 1106 08/06/18 1041 08/08/18 1032  WBC 4.8 2.7* 5.6  NEUTROABS  --  1.0* 3.5  HGB 10.3* 12.1 11.1*  HCT 35.3* 41.5 38.1  MCV 88.7 91.6 92.5  PLT 32* 34* 40*   Basic Metabolic Panel: Recent Labs  Lab 08/02/18 1106 08/06/18 1041 08/08/18 1032  NA 141 143 142  K 2.9* 3.0* 2.9*  CL 107 110 106   CO2 26 24 29   GLUCOSE 104* 102* 83  BUN 11 10 9   CREATININE 0.60 0.52 0.68  CALCIUM 8.3* 7.9* 7.7*   GFR: Estimated Creatinine Clearance: 106.9 mL/min (by C-G formula based on SCr of 0.68 mg/dL). Liver Function Tests: Recent Labs  Lab 08/06/18 1041 08/08/18 1032  AST 39 57*  ALT 25 51*  ALKPHOS 88 76  BILITOT 0.8 0.9  PROT 5.5* 5.4*  ALBUMIN 3.1* 3.0*   No results for input(s): LIPASE, AMYLASE in the last 168 hours. No results for input(s): AMMONIA in the last 168 hours. Coagulation Profile: No results for input(s): INR, PROTIME in the last 168 hours. Cardiac Enzymes: No results for input(s): CKTOTAL, CKMB, CKMBINDEX, TROPONINI in the last 168 hours. BNP (last 3 results) No results for input(s): PROBNP in the  last 8760 hours. HbA1C: No results for input(s): HGBA1C in the last 72 hours. CBG: Recent Labs  Lab 08/02/18 1137  GLUCAP 104*   Lipid Profile: No results for input(s): CHOL, HDL, LDLCALC, TRIG, CHOLHDL, LDLDIRECT in the last 72 hours. Thyroid Function Tests: No results for input(s): TSH, T4TOTAL, FREET4, T3FREE, THYROIDAB in the last 72 hours. Anemia Panel: Recent Labs    08/06/18 1042  FERRITIN 78   Urine analysis:    Component Value Date/Time   COLORURINE YELLOW 08/02/2018 1106   APPEARANCEUR CLEAR 08/02/2018 1106   LABSPEC 1.028 08/02/2018 1106   PHURINE 5.0 08/02/2018 1106   GLUCOSEU NEGATIVE 08/02/2018 1106   HGBUR NEGATIVE 08/02/2018 1106   BILIRUBINUR NEGATIVE 08/02/2018 1106   KETONESUR 5 (A) 08/02/2018 1106   PROTEINUR NEGATIVE 08/02/2018 1106   UROBILINOGEN 0.2 08/07/2017 1429   NITRITE NEGATIVE 08/02/2018 1106   LEUKOCYTESUR NEGATIVE 08/02/2018 1106    Radiological Exams on Admission: No results found.  EKG: Independently reviewed.  Sinus bradycardia normal axis no T wave abnormalities no U waves.  Assessment/Plan Hypokalemia: It has been significantly low since 08/01/2018, likely due to diarrhea which she has had for over 2  weeks. She has an appointment to see her gastroenterologist on September 05, 2018. She is on Protonix and prednisone I will go ahead and d/c her Protonix and prednisone. We will check a magnesium level will replete potassium aggressively orally and IV. Check a phosphorus and replete as needed.  COPD (chronic obstructive pulmonary disease) ([place]) Ongoing tobacco abuse, she does not want to quit counseling has been done she has no wheezing on physical exam planes of no shortness of breath.  MDD (major depressive disorder), single episode, severe with psychosis ([place]) Current medications no changes made.  Chronic thrombocytopenia ([place]) Since 2018 and has improved today. Follow-up with PCP as an outpatient.  Mild elevation of her enzymes: Morbidly obese female, with no elevation in alkaline phosphatase, with a normal bilirubin and no complaints of abdominal pain likely fatty liver we will get an abnormal ultrasound.  DVT prophylaxis: SCD Code Status: full Family Communication: None Disposition Plan: Tmrw Consults called: none Admission status: observation  Charlynne Cousins MD [facility] Pager [phone]  If 7PM-7AM, please contact night-coverage [email or web address] Password [email or web address]  08/08/2018, 5:14 PM

## 2018-08-08 NOTE — ED Provider Notes (Addendum)
[facility] DEPT Provider Note   CSN: [id number] Arrival date & time: 08/08/18  1424     History   Chief Complaint Chief Complaint  Patient presents with  . Abnormal Lab    HPI Tanga Gloor is a 47 y.o. female.  HPI Patient is a 47 year old female who was sent to the emergency department by her primary care provider after a repeat potassium demonstrates that her potassium level is still 2.9.  Patient feels generally weak.  She is had a gastric bypass.  She reports approximately 3 weeks of diarrhea.  She was seen in the emergency department on 08/02/2018 and that time was given IV potassium sent home on 20 mg of daily potassium and recommended to eat foods high in potassium.  She continues to have diarrhea.  She continues to feel weak.  She had labs obtained as an outpatient earlier today was found to be hypokalemic at 2.9 and was sent to the ER for further evaluation.  Patient reports she did just complete a course of moxifloxacin for possible pneumonia but reports that her diarrhea had began prior to this.  No recent travel outside the country.  No recent sick contacts.  She has never had C. difficile colitis.  She has not had a significant work-up in regards to her diarrhea.  She is scheduled to see GI as an outpatient.   Past Medical History:  Diagnosis Date  . Anemia   . COPD (chronic obstructive pulmonary disease) ([place])   . Depression   . Emphysema   . GERD (gastroesophageal reflux disease)   . Tobacco abuse     Patient Active Problem List   Diagnosis Date Noted  . MDD (major depressive disorder), single episode, severe with psychosis ([place]) 11/05/2014  . PTSD (post-traumatic stress disorder) 11/05/2014  . Depressive disorder 06/04/2014  . Anxiety disorder 06/04/2014  . Major depression, recurrent ([place]) 03/21/2014  . Suicide attempt ([place]) 03/21/2014  . Suicidal ideations 03/21/2014  . MDD (major depressive disorder) 03/21/2014  .  Nonspecific abnormal unspecified cardiovascular function study 09/30/2013  . Tobacco use disorder 09/30/2013  . COPD (chronic obstructive pulmonary disease) ([place])   . Emphysema   . GERD (gastroesophageal reflux disease)   . Anemia     Past Surgical History:  Procedure Laterality Date  . ABDOMINAL HYSTERECTOMY    . c section x 3     . CHOLECYSTECTOMY    . GASTRIC BYPASS       OB History   None      Home Medications    Prior to Admission medications   Medication Sig Start Date End Date Taking? Authorizing Provider  acetaminophen (TYLENOL) 500 MG tablet Take 1,000 mg by mouth daily as needed for moderate pain.   Yes [provider]  albuterol (PROAIR HFA) 108 (90 Base) MCG/ACT inhaler Inhale 2 puffs into the lungs every 6 (six) hours as needed for wheezing or shortness of breath. 08/07/17  Yes Dorena Dew, FNP  budesonide-formoterol (SYMBICORT) 160-4.5 MCG/ACT inhaler Inhale 2 puffs into the lungs 2 (two) times daily. 08/07/17  Yes Dorena Dew, FNP  esomeprazole (NEXIUM) 40 MG capsule TAKE ONE CAPSULE BY MOUTH DAILY 02/21/18  Yes Dorena Dew, FNP  potassium chloride 20 MEQ TBCR Take 20 mEq by mouth daily. 07/30/18 08/29/18 Yes Lanae Boast, FNP  tiotropium (SPIRIVA) 18 MCG inhalation capsule Place 1 capsule (18 mcg total) daily into inhaler and inhale. 08/24/17  Yes Dorena Dew, FNP  traZODone ([place])  150 MG tablet Take 1 tablet (150 mg total) by mouth at bedtime. For sleep 11/08/14  Yes Rankin, Shuvon B, NP  venlafaxine XR (EFFEXOR-XR) 75 MG 24 hr capsule Take 1 capsule (75 mg total) by mouth daily with breakfast. For depression 11/08/14  Yes Rankin, Shuvon B, NP  benztropine (COGENTIN) 0.5 MG tablet Take 1 tablet (0.5 mg total) by mouth daily. For drug induced tremors Patient not taking: Reported on 07/27/2018 11/08/14   Rankin, Shuvon B, NP  haloperidol (HALDOL) 5 MG tablet Take 1 tablet (5 mg total) by mouth daily. For psychosis Patient not taking:  Reported on 08/08/2018 11/08/14   Rankin, Shuvon B, NP  methylPREDNISolone (MEDROL DOSEPAK) 4 MG TBPK tablet Take as directed on pack Patient not taking: Reported on 08/08/2018 07/27/18   Lanae Boast, FNP  moxifloxacin (AVELOX) 400 MG tablet Take 1 tablet (400 mg total) by mouth daily. Patient not taking: Reported on 08/08/2018 07/27/18   Lanae Boast, FNP  prazosin (MINIPRESS) 1 MG capsule Take 1 capsule (1 mg total) by mouth at bedtime. For nightmares Patient not taking: Reported on 08/08/2018 11/08/14   Rankin, Delphia Grates B, NP    Family History Family History  Problem Relation Age of Onset  . Diabetes Mother   . Diabetes Other   . Hypertension Other   . CAD Other     Social History Social History   Tobacco Use  . Smoking status: Current Every Day Smoker    Packs/day: 1.00    Years: 1.00    Pack years: 1.00    Types: Cigarettes  . Smokeless tobacco: Never Used  Substance Use Topics  . Alcohol use: No    Comment: occas  . Drug use: No     Allergies   Other   Review of Systems Review of Systems  All other systems reviewed and are negative.    Physical Exam Updated Vital Signs BP 120/78   Pulse (!) 57   Temp 98.4 F (36.9 C) (Oral)   Resp 18   SpO2 100%   Physical Exam  Constitutional: She is oriented to person, place, and time. She appears well-developed and well-nourished. No distress.  HENT:  Head: Normocephalic and atraumatic.  Eyes: EOM are normal.  Neck: Normal range of motion.  Cardiovascular: Normal rate, regular rhythm and normal heart sounds.  Pulmonary/Chest: Effort normal and breath sounds normal.  Abdominal: Soft. She exhibits no distension. There is no tenderness.  Musculoskeletal: Normal range of motion.  Neurological: She is alert and oriented to person, place, and time.  Skin: Skin is warm and dry.  Psychiatric: She has a normal mood and affect. Judgment normal.  Nursing note and vitals reviewed.    ED Treatments / Results   Labs (all labs ordered are listed, but only abnormal results are displayed) Labs Reviewed  C DIFFICILE QUICK SCREEN W PCR REFLEX  MAGNESIUM  PHOSPHORUS  TSH    EKG EKG Interpretation  Date/Time:  Wednesday August 08 2018 15:37:34 EDT Ventricular Rate:  52 PR Interval:    QRS Duration: 107 QT Interval:  513 QTC Calculation: 478 R Axis:   68 Text Interpretation:  Sinus rhythm Short PR interval qtc is 513. increased from prior Confirmed by Jola Schmidt [id number]) on 08/08/2018 5:02:31 PM   Radiology No results found.  Procedures Procedures (including critical care time)  Medications Ordered in ED Medications  potassium chloride 10 mEq in 100 mL IVPB (10 mEq Intravenous New Bag/Given 08/08/18 1624)  potassium chloride SA (K-DUR,KLOR-CON) CR  tablet 40 mEq (40 mEq Oral Given 08/08/18 1626)     Initial Impression / Assessment and Plan / ED Course  I have reviewed the triage vital signs and the nursing notes.  Pertinent labs & imaging results that were available during my care of the patient were reviewed by me and considered in my medical decision making (see chart for details).     Failed outpatient treatment of her hypokalemia.  She continues to feel weak.  She has not had any significant work up for her diarrhea.  If she can give [place] a stool sample I will send this for C. difficile.  IV fluids now.  IV potassium replacement now.  We will check a mag and fossa.  We will add thyroid studies.  Known thrombocytopenia based on prior labs.  Given her difficult to manage hypokalemia as an outpatient I think she will benefit from 24-hour observation for treatment and replacement of her potassium.  Admit  Final Clinical Impressions(s) / ED Diagnoses   Final diagnoses:  Hypokalemia  Abnormal QT interval present on electrocardiogram    ED Discharge Orders    None       Jola Schmidt, MD 08/08/18 1658    Jola Schmidt, MD 08/08/18 1704

## 2018-08-08 NOTE — Telephone Encounter (Signed)
A new hematology appt has been scheduled for the pt to see Dr. Audelia Hives on 10/30 at 1pm. Letter mailed to the pt.

## 2018-08-08 NOTE — Progress Notes (Signed)
Patient seen in the day hospital today for labs today. Patient taking oral potassium daily. Given banana bag 07/31/2018. Also potassium given IV in ED 08/02/2018. Patient with hx of malnutrition s/p gastric bypass. Hx of COPD.  Potassium today 2.9. Referring to the ED for further evaluation.

## 2018-08-08 NOTE — ED Notes (Signed)
ED TO INPATIENT HANDOFF REPORT  Name/Age/Gender Barbara Stanley 47 y.o. female  Code Status Code Status History    Date Active Date Inactive Code Status Order ID Comments User Context   11/05/2014 0446 11/08/2014 1722 Full Code [id number]  Rennie Plowman Inpatient   11/04/2014 1755 11/05/2014 0446 Full Code [id number]  Mackie Pai., MD ED   06/04/2014 0410 06/04/2014 1524 Full Code [id number]  Brent General, PA-C ED   03/21/2014 1519 03/25/2014 1832 Full Code [id number]  Waylan Boga, NP Inpatient   03/20/2014 2236 03/21/2014 1519 Full Code [id number]  Pamella Pert, MD ED   04/12/2013 0450 04/12/2013 1427 Full Code [id number]  Beverely Pace ED      Home/SNF/Other Home  Chief Complaint tired  Level of Care/Admitting Diagnosis ED Disposition    ED Disposition Condition [place]: [facility] [[id number]]  Level of Care: Telemetry [5]  Admit to tele based on following criteria: Monitor QTC interval  Diagnosis: Hypokalemia [749449]  Admitting Physician: Charlynne Cousins [[id number]]  Attending Physician: Charlynne Cousins [[id number]]  PT Class (Do Not Modify): Observation [104]  PT Acc Code (Do Not Modify): Observation [10022]       Medical History Past Medical History:  Diagnosis Date  . Anemia   . COPD (chronic obstructive pulmonary disease) ([place])   . Depression   . Emphysema   . GERD (gastroesophageal reflux disease)   . Tobacco abuse     Allergies Allergies  Allergen Reactions  . Other     "Banana Bag" given to patient at physician's office    IV Location/Drains/Wounds Patient Lines/Drains/Airways Status   Active Line/Drains/Airways    Name:   Placement date:   Placement time:   Site:   Days:   Peripheral IV 08/08/18 Left Antecubital   08/08/18    1623    Antecubital   less than 1          Labs/Imaging Results for orders placed or performed during the hospital encounter of 08/08/18 (from the past 48  hour(s))  Magnesium     Status: None   Collection Time: 08/08/18  4:18 PM  Result Value Ref Range   Magnesium 1.9 1.7 - 2.4 mg/dL    Comment: Performed at [facility], [place] [address], [place], [place] [address]  Phosphorus     Status: None   Collection Time: 08/08/18  4:18 PM  Result Value Ref Range   Phosphorus 3.8 2.5 - 4.6 mg/dL    Comment: Performed at [facility], [place] [address] [place], [place] [address]   No results found.  Pending Labs Unresulted Labs (From admission, onward)    Start     Ordered   08/08/18 1659  TSH  STAT,   STAT     08/08/18 1658   08/08/18 1622  C difficile quick scan w PCR reflex  (C Difficile quick screen w PCR reflex panel)  Once, for 24 hours,   R     08/08/18 1621   Signed and Held  CBC  Tomorrow morning,   R     Signed and Held   Signed and Held  Basic metabolic panel  Tomorrow morning,   R     Signed and Held   Signed and Held  Magnesium  Tomorrow morning,   R     Signed and Held   Signed and Held  Phosphorus  Once,   R  Signed and Held          Vitals/Pain Today's Vitals   08/08/18 1435 08/08/18 1436 08/08/18 1625 08/08/18 1700  BP: 125/81  120/78 119/74  Pulse: 72  (!) 57 (!) 53  Resp: 20  18 10   Temp: 98.4 F (36.9 C)     TempSrc: Oral     SpO2: 97%  100% 97%  PainSc:  0-No pain      Isolation Precautions Enteric precautions (UV disinfection)  Medications Medications  potassium chloride 10 mEq in 100 mL IVPB (10 mEq Intravenous New Bag/Given 08/08/18 1733)  magnesium sulfate IVPB 4 g 100 mL (has no administration in time range)  sodium chloride 0.9 % bolus 1,000 mL (has no administration in time range)  sodium chloride 0.9 % 1,000 mL with potassium chloride 40 mEq infusion (has no administration in time range)  potassium chloride SA (K-DUR,KLOR-CON) CR tablet 40 mEq (40 mEq Oral Given 08/08/18 1626)  sodium chloride 0.9 % bolus 1,000 mL (1,000 mLs Intravenous New Bag/Given  08/08/18 1733)    Mobility walks

## 2018-08-08 NOTE — ED Triage Notes (Signed)
Patient reports sent from PCP for potassium 2.9. Denies chest pain and SOB. Reports nausea and diarrhea x2 weeks.

## 2018-08-09 ENCOUNTER — Other Ambulatory Visit: Payer: Self-pay

## 2018-08-09 ENCOUNTER — Observation Stay (HOSPITAL_COMMUNITY): Payer: Medicaid Other

## 2018-08-09 DIAGNOSIS — F323 Major depressive disorder, single episode, severe with psychotic features: Secondary | ICD-10-CM | POA: Diagnosis present

## 2018-08-09 DIAGNOSIS — F17210 Nicotine dependence, cigarettes, uncomplicated: Secondary | ICD-10-CM | POA: Diagnosis present

## 2018-08-09 DIAGNOSIS — R945 Abnormal results of liver function studies: Secondary | ICD-10-CM | POA: Diagnosis not present

## 2018-08-09 DIAGNOSIS — E876 Hypokalemia: Secondary | ICD-10-CM | POA: Diagnosis present

## 2018-08-09 DIAGNOSIS — R9431 Abnormal electrocardiogram [ECG] [EKG]: Secondary | ICD-10-CM

## 2018-08-09 DIAGNOSIS — R197 Diarrhea, unspecified: Secondary | ICD-10-CM | POA: Diagnosis present

## 2018-08-09 DIAGNOSIS — Z79899 Other long term (current) drug therapy: Secondary | ICD-10-CM | POA: Diagnosis not present

## 2018-08-09 DIAGNOSIS — Z9884 Bariatric surgery status: Secondary | ICD-10-CM | POA: Diagnosis not present

## 2018-08-09 DIAGNOSIS — K219 Gastro-esophageal reflux disease without esophagitis: Secondary | ICD-10-CM | POA: Diagnosis present

## 2018-08-09 DIAGNOSIS — D696 Thrombocytopenia, unspecified: Secondary | ICD-10-CM

## 2018-08-09 DIAGNOSIS — D61818 Other pancytopenia: Secondary | ICD-10-CM | POA: Diagnosis present

## 2018-08-09 DIAGNOSIS — D519 Vitamin B12 deficiency anemia, unspecified: Secondary | ICD-10-CM | POA: Diagnosis not present

## 2018-08-09 DIAGNOSIS — Z7951 Long term (current) use of inhaled steroids: Secondary | ICD-10-CM | POA: Diagnosis not present

## 2018-08-09 DIAGNOSIS — J449 Chronic obstructive pulmonary disease, unspecified: Secondary | ICD-10-CM | POA: Diagnosis not present

## 2018-08-09 DIAGNOSIS — Z888 Allergy status to other drugs, medicaments and biological substances status: Secondary | ICD-10-CM | POA: Diagnosis not present

## 2018-08-09 LAB — CBC
HCT: 31.8 % — ABNORMAL LOW (ref 36.0–46.0)
Hemoglobin: 9.2 g/dL — ABNORMAL LOW (ref 12.0–15.0)
MCH: 27 pg (ref 26.0–34.0)
MCHC: 28.9 g/dL — AB (ref 30.0–36.0)
MCV: 93.3 fL (ref 80.0–100.0)
PLATELETS: 21 10*3/uL — AB (ref 150–400)
RBC: 3.41 MIL/uL — AB (ref 3.87–5.11)
RDW: 21 % — ABNORMAL HIGH (ref 11.5–15.5)
WBC: 2.8 10*3/uL — ABNORMAL LOW (ref 4.0–10.5)
nRBC: 0 % (ref 0.0–0.2)

## 2018-08-09 LAB — BASIC METABOLIC PANEL
Anion gap: 6 (ref 5–15)
BUN: 9 mg/dL (ref 6–20)
CO2: 29 mmol/L (ref 22–32)
CREATININE: 0.52 mg/dL (ref 0.44–1.00)
Calcium: 7.2 mg/dL — ABNORMAL LOW (ref 8.9–10.3)
Chloride: 109 mmol/L (ref 98–111)
GFR calc Af Amer: >60 mL/min (ref >60)
Glucose, Bld: 82 mg/dL (ref 70–99)
POTASSIUM: 2.8 mmol/L — AB (ref 3.5–5.1)
Sodium: 144 mmol/L (ref 135–145)

## 2018-08-09 LAB — MAGNESIUM: Magnesium: 2.5 mg/dL — ABNORMAL HIGH (ref 1.7–2.4)

## 2018-08-09 MED ORDER — LOPERAMIDE HCL 2 MG PO CAPS
2.0000 mg | ORAL_CAPSULE | Freq: Four times a day (QID) | ORAL | Status: DC | PRN
  Administered 2018-08-09 – 2018-08-10 (×3): 2 mg via ORAL
  Filled 2018-08-09 (×4): qty 1

## 2018-08-09 MED ORDER — HEPARIN SODIUM (PORCINE) 5000 UNIT/ML IJ SOLN
5000.0000 [IU] | Freq: Two times a day (BID) | INTRAMUSCULAR | Status: DC
  Administered 2018-08-09 – 2018-08-10 (×2): 5000 [IU] via SUBCUTANEOUS
  Filled 2018-08-09 (×2): qty 1

## 2018-08-09 MED ORDER — LACTATED RINGERS IV SOLN
INTRAVENOUS | Status: DC
  Administered 2018-08-09 – 2018-08-11 (×3): via INTRAVENOUS

## 2018-08-09 MED ORDER — PANTOPRAZOLE SODIUM 40 MG PO TBEC
40.0000 mg | DELAYED_RELEASE_TABLET | Freq: Every day | ORAL | Status: DC
  Administered 2018-08-09 – 2018-08-12 (×4): 40 mg via ORAL
  Filled 2018-08-09 (×4): qty 1

## 2018-08-09 MED ORDER — POTASSIUM CHLORIDE CRYS ER 20 MEQ PO TBCR
40.0000 meq | EXTENDED_RELEASE_TABLET | ORAL | Status: AC
  Administered 2018-08-09 (×2): 40 meq via ORAL
  Filled 2018-08-09 (×2): qty 2

## 2018-08-09 NOTE — Progress Notes (Signed)
The order for cardiac monitoring has expired.  PCP was notified to review the cardiac monitoring order, if appropriate.  Awaiting any new orders.

## 2018-08-09 NOTE — Progress Notes (Signed)
PROGRESS NOTE    Barbara Stanley  [id number]:[id number] DOB: September 21, 1971 DOA: 08/08/2018 PCP: Lanae Boast, FNP    Brief Narrative:  47 year old female who presented with hypokalemia.  She does have significant past medical history for depression, gastric bypass and tobacco abuse.  Patient reported worsening diarrhea for the last 3 weeks, with nausea and poor appetite, no frank abdominal pain.  She was diagnosed with hypokalemia, failed outpatient therapy.  On the initial physical examination blood pressure 125/81, heart rate 72, respiratory 20, temperature 98.4, she had moist mucous membranes, lungs clear to auscultation bilaterally, heart S1-S2 present rhythmic, abdomen protuberant and nontender, trace lower extremity edema.  Sodium 142, potassium 2.9, chloride 106, bicarb 29, glucose 83, BUN 9, creatinine 0.68, white count 5.6, hemoglobin 9.1, 38.1, platelets 40, C. difficile was negative.  Urinalysis negative for infection. Abdominal ultrasound with common bile duct in the upper limits of normal, status post cholecystectomy.  EKG sinus bradycardia, normal axis, normal intervals.  Patient was admitted to the hospital working diagnosis of severe diarrhea complicated by hypokalemia refractive to outpatient therapy   Assessment & Plan:   Active Problems:   COPD (chronic obstructive pulmonary disease) (HCC)   MDD (major depressive disorder), single episode, severe with psychosis ([place])   Hypokalemia   Thrombocytopenia ([place])   1. Diarrhea complicated with hypokalemia. Persistent hypokalemia, will continue k correction with po Kcl, 80 meq in 2 divided doses. Continue hydration with balanced electrolyte solutions and will add loperamide for diarrhea control. Follow on renal panel in am.   2. GERD. Will resume pantoprazole due to abdominal pain. Patient tolerating po diet well.   3. COPD. Stable with no signs of exacerbation, continue tiotropium and dulera  4. Depression. Continue haldol, trazodone  and venlafaxine, no confusion or agitation.    DVT prophylaxis: heparin sq  Code Status: full Family Communication: no family at the bedside  Disposition Plan/ discharge barriers: pending clinical improvement    Consultants:     Procedures:     Antimicrobials:       Subjective: Patient continue to have diarrhea, no nausea or vomiting, no chest pain or dyspnea.   Objective: Vitals:   08/09/18 0442 08/09/18 0843 08/09/18 0846 08/09/18 1306  BP: 126/71   114/69  Pulse: (!) 50   60  Resp: 18   16  Temp: 98.3 F (36.8 C)   98.2 F (36.8 C)  TempSrc:    Oral  SpO2: 93% 96% 96% 94%    Intake/Output Summary (Last 24 hours) at 08/09/2018 1619 Last data filed at 08/09/2018 1504 Gross per 24 hour  Intake 2574.18 ml  Output -  Net 2574.18 ml   There were no vitals filed for this visit.  Examination:   General: deconditioned and ill looking appearing  Neurology: Awake and alert, non focal  E ENT: mild pallor, no icterus, oral mucosa dry Cardiovascular: No JVD. S1-S2 present, rhythmic, no gallops, rubs, or murmurs. No lower extremity edema. Pulmonary:  positive breath sounds bilaterally, adequate air movement, no wheezing, rhonchi or rales. Gastrointestinal. Abdomen protuberant with no organomegaly, non tender, no rebound or guarding Skin. No rashes Musculoskeletal: no joint deformities     Data Reviewed: I have personally reviewed following labs and imaging studies  CBC: Recent Labs  Lab 08/06/18 1041 08/08/18 1032 08/09/18 0350  WBC 2.7* 5.6 2.8*  NEUTROABS 1.0* 3.5  --   HGB 12.1 11.1* 9.2*  HCT 41.5 38.1 31.8*  MCV 91.6 92.5 93.3  PLT 34* 40* 21*  Basic Metabolic Panel: Recent Labs  Lab 08/06/18 1041 08/08/18 1032 08/08/18 1618 08/08/18 2128 08/09/18 0350  NA 143 142  --   --  144  K 3.0* 2.9*  --   --  2.8*  CL 110 106  --   --  109  CO2 24 29  --   --  29  GLUCOSE 102* 83  --   --  82  BUN 10 9  --   --  9  CREATININE 0.52 0.68  --    --  0.52  CALCIUM 7.9* 7.7*  --   --  7.2*  MG  --   --  1.9  --  2.5*  PHOS  --   --  3.8 3.6  --    GFR: Estimated Creatinine Clearance: 106.9 mL/min (by C-G formula based on SCr of 0.52 mg/dL). Liver Function Tests: Recent Labs  Lab 08/06/18 1041 08/08/18 1032  AST 39 57*  ALT 25 51*  ALKPHOS 88 76  BILITOT 0.8 0.9  PROT 5.5* 5.4*  ALBUMIN 3.1* 3.0*   No results for input(s): LIPASE, AMYLASE in the last 168 hours. No results for input(s): AMMONIA in the last 168 hours. Coagulation Profile: No results for input(s): INR, PROTIME in the last 168 hours. Cardiac Enzymes: No results for input(s): CKTOTAL, CKMB, CKMBINDEX, TROPONINI in the last 168 hours. BNP (last 3 results) No results for input(s): PROBNP in the last 8760 hours. HbA1C: No results for input(s): HGBA1C in the last 72 hours. CBG: No results for input(s): GLUCAP in the last 168 hours. Lipid Profile: No results for input(s): CHOL, HDL, LDLCALC, TRIG, CHOLHDL, LDLDIRECT in the last 72 hours. Thyroid Function Tests: Recent Labs    08/08/18 1905  TSH 1.773   Anemia Panel: No results for input(s): VITAMINB12, FOLATE, FERRITIN, TIBC, IRON, RETICCTPCT in the last 72 hours.    Radiology Studies: I have reviewed all of the imaging during this hospital visit personally     Scheduled Meds: . benztropine  0.5 mg Oral Daily  . haloperidol  5 mg Oral Daily  . heparin injection (subcutaneous)  5,000 Units Subcutaneous Q12H  . mometasone-formoterol  2 puff Inhalation BID  . pantoprazole  40 mg Oral Daily  . [START ON 08/10/2018] potassium chloride  20 mEq Oral Daily  . potassium chloride  40 mEq Oral Q4H  . prazosin  1 mg Oral QHS  . tiotropium  18 mcg Inhalation Daily  . traZODone  150 mg Oral QHS  . venlafaxine XR  75 mg Oral Q breakfast   Continuous Infusions: . lactated ringers 50 mL/hr at 08/09/18 1503  . sodium chloride       LOS: 0 days        Tawni Millers, MD [facility] Pager [phone]

## 2018-08-09 NOTE — Progress Notes (Cosign Needed)
PROGRESS NOTE    Barbara Stanley  WUJ:[id number] DOB: 1971-09-22 DOA: 08/08/2018 PCP: Lanae Boast, [place]  Outpatient Specialists:   Brief Narrative: Barbara Stanley is a 47 y.o patient who has been admitted because of hypokalemia. Has a history of major depression, tobacco use, and gastric by pass. Patient was sent to the ED by her PCP for hypokalemia, 2 weeks of diarrhea, and weakness that has been occurring for two months. Lab shows low potassium and platelet level other wise unremarkable.  Assessment & Plan:   1. Hypokalemia: Secondary to diarrhea. Labs still show a low potassium level. Continue patient on IV potassium. Monitor potassium and magnesium levels.    2. Diarrhea: Likely caused because of viral infection. Patient is had more five diarrhea since admission. Prescribed loperamide 2 mg. Continue on Iv fluids.  3. Thrombocytopenia: Has a history of thrombocytopenia, current platelet 22. moniter CBC.   4. Major depressive disorder: Continue on Effexor 75mg  and Haldol 5mg .  5. Tobacco abuse: has a history of 1 pack a day. Counseled on quitting.    DVT prophylaxis:  SCD  Code Status:  Full  Family Communication: No family present at bed side  Disposition Plan:  Will be discharged once potassium level stabilize.  Consultants:  None  Procedures:  None  Antimicrobials:  None   Subjective: Had more than five diarrhea episode since admission. No abdominal pain. Has been tolerating PO. Has good appetite.    Objective: Vitals:   08/08/18 2159 08/09/18 0442 08/09/18 0843 08/09/18 0846  BP:  126/71    Pulse:  (!) 50    Resp:  18    Temp:  98.3 F (36.8 C)    TempSrc:      SpO2: 93% 93% 96% 96%    Intake/Output Summary (Last 24 hours) at 08/09/2018 1140 Last data filed at 08/09/2018 1104 Gross per 24 hour  Intake 1828.67 ml  Output -  Net 1828.67 ml   There were no vitals filed for this visit.  Examination:  General exam: Appears calm, with mild  comfortable  Respiratory system: Clear to auscultation. Respiratory effort normal. Cardiovascular system: S1 & S2 heard, RRR. No JVD, murmurs, rubs, gallops or clicks. No pedal edema. Gastrointestinal system: Abdomen is nondistended, soft and nontender. No organomegaly or masses felt. Normal bowel sounds heard. Central nervous system: Alert and oriented. No focal neurological deficits. Extremities: Symmetric 5 x 5 power. Skin: No rashes, lesions or ulcers Psychiatry: Judgement and insight appear normal. Mood & affect appropriate.     Data Reviewed: I have personally reviewed following labs and imaging studies  CBC: Recent Labs  Lab 08/06/18 1041 08/08/18 1032 08/09/18 0350  WBC 2.7* 5.6 2.8*  NEUTROABS 1.0* 3.5  --   HGB 12.1 11.1* 9.2*  HCT 41.5 38.1 31.8*  MCV 91.6 92.5 93.3  PLT 34* 40* 21*   Basic Metabolic Panel: Recent Labs  Lab 08/06/18 1041 08/08/18 1032 08/08/18 1618 08/08/18 2128 08/09/18 0350  NA 143 142  --   --  144  K 3.0* 2.9*  --   --  2.8*  CL 110 106  --   --  109  CO2 24 29  --   --  29  GLUCOSE 102* 83  --   --  82  BUN 10 9  --   --  9  CREATININE 0.52 0.68  --   --  0.52  CALCIUM 7.9* 7.7*  --   --  7.2*  MG  --   --  1.9  --  2.5*  PHOS  --   --  3.8 3.6  --    GFR: Estimated Creatinine Clearance: 106.9 mL/min (by C-G formula based on SCr of 0.52 mg/dL). Liver Function Tests: Recent Labs  Lab 08/06/18 1041 08/08/18 1032  AST 39 57*  ALT 25 51*  ALKPHOS 88 76  BILITOT 0.8 0.9  PROT 5.5* 5.4*  ALBUMIN 3.1* 3.0*   No results for input(s): LIPASE, AMYLASE in the last 168 hours. No results for input(s): AMMONIA in the last 168 hours. Coagulation Profile: No results for input(s): INR, PROTIME in the last 168 hours. Cardiac Enzymes: No results for input(s): CKTOTAL, CKMB, CKMBINDEX, TROPONINI in the last 168 hours. BNP (last 3 results) No results for input(s): PROBNP in the last 8760 hours. HbA1C: No results for input(s): HGBA1C in  the last 72 hours. CBG: No results for input(s): GLUCAP in the last 168 hours. Lipid Profile: No results for input(s): CHOL, HDL, LDLCALC, TRIG, CHOLHDL, LDLDIRECT in the last 72 hours. Thyroid Function Tests: Recent Labs    08/08/18 1905  TSH 1.773   Anemia Panel: No results for input(s): VITAMINB12, FOLATE, FERRITIN, TIBC, IRON, RETICCTPCT in the last 72 hours. Urine analysis:    Component Value Date/Time   COLORURINE YELLOW 08/02/2018 1106   APPEARANCEUR CLEAR 08/02/2018 1106   LABSPEC 1.028 08/02/2018 1106   PHURINE 5.0 08/02/2018 1106   GLUCOSEU NEGATIVE 08/02/2018 1106   HGBUR NEGATIVE 08/02/2018 1106   BILIRUBINUR NEGATIVE 08/02/2018 1106   KETONESUR 5 (A) 08/02/2018 1106   PROTEINUR NEGATIVE 08/02/2018 1106   UROBILINOGEN 0.2 08/07/2017 1429   NITRITE NEGATIVE 08/02/2018 1106   LEUKOCYTESUR NEGATIVE 08/02/2018 1106   Sepsis Labs: @LABRCNTIP (procalcitonin:4,lacticidven:4)  ) Recent Results (from the past 240 hour(s))  C difficile quick scan w PCR reflex     Status: None   Collection Time: 08/08/18  4:22 PM  Result Value Ref Range Status   C Diff antigen NEGATIVE NEGATIVE Final   C Diff toxin NEGATIVE NEGATIVE Final   C Diff interpretation No C. difficile detected.  Final    Comment: Performed at [facility], [place] [address] [place], [place] [address]         Radiology Studies: No results found.      Scheduled Meds: . benztropine  0.5 mg Oral Daily  . haloperidol  5 mg Oral Daily  . mometasone-formoterol  2 puff Inhalation BID  . [START ON 08/10/2018] potassium chloride  20 mEq Oral Daily  . prazosin  1 mg Oral QHS  . tiotropium  18 mcg Inhalation Daily  . traZODone  150 mg Oral QHS  . venlafaxine XR  75 mg Oral Q breakfast   Continuous Infusions: . 0.9 % NaCl with KCl 40 mEq / L 100 mL/hr at 08/09/18 1104  . sodium chloride       LOS: 0 days    Time spent:     Sterling Big, MD [facility] Pager  [phone]  If 7PM-7AM, please contact night-coverage [email or web address] Password [email or web address] 08/09/2018, 11:40 AM

## 2018-08-09 NOTE — Progress Notes (Signed)
Critical Lab Platelets:    21  Provider has bee made aware by writer Will continue to monitor

## 2018-08-10 DIAGNOSIS — D519 Vitamin B12 deficiency anemia, unspecified: Secondary | ICD-10-CM

## 2018-08-10 LAB — CBC WITH DIFFERENTIAL/PLATELET
Abs Immature Granulocytes: 0.02 10*3/uL (ref 0.00–0.07)
BASOS ABS: 0 10*3/uL (ref 0.0–0.1)
Basophils Relative: 0 %
EOS ABS: 0 10*3/uL (ref 0.0–0.5)
Eosinophils Relative: 2 %
HEMATOCRIT: 37.2 % (ref 36.0–46.0)
Hemoglobin: 10.8 g/dL — ABNORMAL LOW (ref 12.0–15.0)
Immature Granulocytes: 1 %
LYMPHS ABS: 1.2 10*3/uL (ref 0.7–4.0)
LYMPHS PCT: 46 %
MCH: 27.3 pg (ref 26.0–34.0)
MCHC: 29 g/dL — ABNORMAL LOW (ref 30.0–36.0)
MCV: 93.9 fL (ref 80.0–100.0)
Monocytes Absolute: 0.2 10*3/uL (ref 0.1–1.0)
Monocytes Relative: 6 %
NEUTROS ABS: 1.2 10*3/uL — AB (ref 1.7–7.7)
NRBC: 0 % (ref 0.0–0.2)
Neutrophils Relative %: 45 %
Platelets: 22 10*3/uL — CL (ref 150–400)
RBC: 3.96 MIL/uL (ref 3.87–5.11)
RDW: 21.8 % — AB (ref 11.5–15.5)
WBC: 2.6 10*3/uL — ABNORMAL LOW (ref 4.0–10.5)

## 2018-08-10 LAB — BASIC METABOLIC PANEL
Anion gap: 8 (ref 5–15)
BUN: 8 mg/dL (ref 6–20)
CALCIUM: 8 mg/dL — AB (ref 8.9–10.3)
CO2: 28 mmol/L (ref 22–32)
Chloride: 110 mmol/L (ref 98–111)
Creatinine, Ser: 0.59 mg/dL (ref 0.44–1.00)
GFR calc Af Amer: >60 mL/min (ref >60)
Glucose, Bld: 87 mg/dL (ref 70–99)
Potassium: 4 mmol/L (ref 3.5–5.1)
Sodium: 146 mmol/L — ABNORMAL HIGH (ref 135–145)

## 2018-08-10 MED ORDER — CYANOCOBALAMIN 1000 MCG/ML IJ SOLN
1000.0000 ug | Freq: Once | INTRAMUSCULAR | Status: AC
  Administered 2018-08-10: 1000 ug via INTRAMUSCULAR
  Filled 2018-08-10: qty 1

## 2018-08-10 NOTE — Progress Notes (Signed)
PROGRESS NOTE    Barbara Stanley  [id number]:[id number] DOB: Mar 23, 1971 DOA: 08/08/2018 PCP: Lanae Boast, FNP    Brief Narrative:  47 year old female who presented with hypokalemia.  She does have significant past medical history for depression, gastric bypass and tobacco abuse.  Patient reported worsening diarrhea for the last 3 weeks, with nausea and poor appetite, no frank abdominal pain.  She was diagnosed with hypokalemia, failed outpatient therapy.  On the initial physical examination blood pressure 125/81, heart rate 72, respiratory 20, temperature 98.4, she had moist mucous membranes, lungs clear to auscultation bilaterally, heart S1-S2 present rhythmic, abdomen protuberant and nontender, trace lower extremity edema.  Sodium 142, potassium 2.9, chloride 106, bicarb 29, glucose 83, BUN 9, creatinine 0.68, white count 5.6, hemoglobin 9.1, 38.1, platelets 40, C. difficile was negative.  Urinalysis negative for infection. Abdominal ultrasound with common bile duct in the upper limits of normal, status post cholecystectomy.  EKG sinus bradycardia, normal axis, normal intervals.  Patient was admitted to the hospital working diagnosis of severe diarrhea complicated by hypokalemia refractive to outpatient therapy    Assessment & Plan:   Active Problems:   COPD (chronic obstructive pulmonary disease) (HCC)   MDD (major depressive disorder), single episode, severe with psychosis ([place])   Hypokalemia   Thrombocytopenia ([place])   1. Diarrhea complicated with hypokalemia. Continue imodium for diarrhea control, electrolytes have been corrected, serum K at 4.0 with serum cr at 0.59 and bicarbonate at 28. Continue hydration with balance electrolyte solutions at 50 ml per hour.   2. GERD. Continue pantoprazole.   3. COPD. Stable with no signs of exacerbation, continue tiotropium and dulera  4. Depression. On haldol, trazodone and venlafaxine, tolerating well.  5. Pancytopenia with severe  thrombocytopenia. B12 level in the low normal range, will check MMA and homocysteine urine levels, and will order 1000 mcg of B12. Follow on cell count in am. Today platelets dow to 22, hb at 10,8 and wbc at 2,6.   DVT prophylaxis: heparin sq  Code Status: full Family Communication: no family at the bedside  Disposition Plan/ discharge barriers: pending follow on platelets.     Consultants:     Procedures:     Antimicrobials:    Subjective: Improved diarrhea but not back to baseline, no nausea or vomiting, no chest pain or dyspnea. Per patient's husband patient was diagnosed with B12 deficiency as outpatient, not treated.   Objective: Vitals:   08/09/18 2057 08/10/18 0537 08/10/18 0923 08/10/18 0925  BP: 123/70 123/73    Pulse: (!) 51 (!) 51    Resp: 16 16    Temp: 98.6 F (37 C) 98 F (36.7 C)    TempSrc: Oral Oral    SpO2: 97% 98% 97% 97%    Intake/Output Summary (Last 24 hours) at 08/10/2018 1321 Last data filed at 08/10/2018 0900 Gross per 24 hour  Intake 1792.87 ml  Output -  Net 1792.87 ml   There were no vitals filed for this visit.  Examination:   General: deconditioned  Neurology: Awake and alert, non focal  E ENT: mild pallor, no icterus, oral mucosa moist Cardiovascular: No JVD. S1-S2 present, rhythmic, no gallops, rubs, or murmurs. Trace lower extremity edema. Pulmonary: positive breath sounds bilaterally, adequate air movement, no wheezing, rhonchi or rales. Gastrointestinal. Abdomen protubernt with no organomegaly, non tender, no rebound or guarding Skin. No rashes Musculoskeletal: no joint deformities     Data Reviewed: I have personally reviewed following labs and imaging studies  CBC: Recent Labs  Lab 08/06/18 1041 08/08/18 1032 08/09/18 0350 08/10/18 0418  WBC 2.7* 5.6 2.8* 2.6*  NEUTROABS 1.0* 3.5  --  1.2*  HGB 12.1 11.1* 9.2* 10.8*  HCT 41.5 38.1 31.8* 37.2  MCV 91.6 92.5 93.3 93.9  PLT 34* 40* 21* 22*   Basic  Metabolic Panel: Recent Labs  Lab 08/06/18 1041 08/08/18 1032 08/08/18 1618 08/08/18 2128 08/09/18 0350 08/10/18 0418  NA 143 142  --   --  144 146*  K 3.0* 2.9*  --   --  2.8* 4.0  CL 110 106  --   --  109 110  CO2 24 29  --   --  29 28  GLUCOSE 102* 83  --   --  82 87  BUN 10 9  --   --  9 8  CREATININE 0.52 0.68  --   --  0.52 0.59  CALCIUM 7.9* 7.7*  --   --  7.2* 8.0*  MG  --   --  1.9  --  2.5*  --   PHOS  --   --  3.8 3.6  --   --    GFR: Estimated Creatinine Clearance: 106.9 mL/min (by C-G formula based on SCr of 0.59 mg/dL). Liver Function Tests: Recent Labs  Lab 08/06/18 1041 08/08/18 1032  AST 39 57*  ALT 25 51*  ALKPHOS 88 76  BILITOT 0.8 0.9  PROT 5.5* 5.4*  ALBUMIN 3.1* 3.0*   No results for input(s): LIPASE, AMYLASE in the last 168 hours. No results for input(s): AMMONIA in the last 168 hours. Coagulation Profile: No results for input(s): INR, PROTIME in the last 168 hours. Cardiac Enzymes: No results for input(s): CKTOTAL, CKMB, CKMBINDEX, TROPONINI in the last 168 hours. BNP (last 3 results) No results for input(s): PROBNP in the last 8760 hours. HbA1C: No results for input(s): HGBA1C in the last 72 hours. CBG: No results for input(s): GLUCAP in the last 168 hours. Lipid Profile: No results for input(s): CHOL, HDL, LDLCALC, TRIG, CHOLHDL, LDLDIRECT in the last 72 hours. Thyroid Function Tests: Recent Labs    08/08/18 1905  TSH 1.773   Anemia Panel: No results for input(s): VITAMINB12, FOLATE, FERRITIN, TIBC, IRON, RETICCTPCT in the last 72 hours.    Radiology Studies: I have reviewed all of the imaging during this hospital visit personally     Scheduled Meds: . benztropine  0.5 mg Oral Daily  . haloperidol  5 mg Oral Daily  . mometasone-formoterol  2 puff Inhalation BID  . pantoprazole  40 mg Oral Daily  . prazosin  1 mg Oral QHS  . tiotropium  18 mcg Inhalation Daily  . traZODone  150 mg Oral QHS  . venlafaxine XR  75 mg Oral  Q breakfast   Continuous Infusions: . lactated ringers 50 mL/hr at 08/10/18 0921  . sodium chloride       LOS: 1 day        Tawni Millers, MD [facility] Pager [phone]

## 2018-08-10 NOTE — Progress Notes (Signed)
I have resumed care for this patient and agree with previous RN shift assessment. Pt. In bed comfortable, no c/o pain. Will continue to monitor closely

## 2018-08-11 DIAGNOSIS — F323 Major depressive disorder, single episode, severe with psychotic features: Secondary | ICD-10-CM

## 2018-08-11 LAB — CBC WITH DIFFERENTIAL/PLATELET
ABS IMMATURE GRANULOCYTES: 0.01 10*3/uL (ref 0.00–0.07)
BASOS ABS: 0 10*3/uL (ref 0.0–0.1)
BASOS PCT: 1 %
Eosinophils Absolute: 0 10*3/uL (ref 0.0–0.5)
Eosinophils Relative: 1 %
HCT: 31.7 % — ABNORMAL LOW (ref 36.0–46.0)
Hemoglobin: 9.4 g/dL — ABNORMAL LOW (ref 12.0–15.0)
Immature Granulocytes: 1 %
LYMPHS PCT: 42 %
Lymphs Abs: 0.7 10*3/uL (ref 0.7–4.0)
MCH: 27.8 pg (ref 26.0–34.0)
MCHC: 29.7 g/dL — AB (ref 30.0–36.0)
MCV: 93.8 fL (ref 80.0–100.0)
Monocytes Absolute: 0.1 10*3/uL (ref 0.1–1.0)
Monocytes Relative: 8 %
NEUTROS ABS: 0.8 10*3/uL — AB (ref 1.7–7.7)
NRBC: 0 % (ref 0.0–0.2)
Neutrophils Relative %: 47 %
PLATELETS: 18 10*3/uL — AB (ref 150–400)
RBC: 3.38 MIL/uL — AB (ref 3.87–5.11)
RDW: 21.9 % — AB (ref 11.5–15.5)
WBC: 1.6 10*3/uL — AB (ref 4.0–10.5)

## 2018-08-11 LAB — BASIC METABOLIC PANEL
ANION GAP: 2 — AB (ref 5–15)
BUN: 9 mg/dL (ref 6–20)
CALCIUM: 7.7 mg/dL — AB (ref 8.9–10.3)
CO2: 29 mmol/L (ref 22–32)
CREATININE: 0.67 mg/dL (ref 0.44–1.00)
Chloride: 110 mmol/L (ref 98–111)
Glucose, Bld: 84 mg/dL (ref 70–99)
Potassium: 3.6 mmol/L (ref 3.5–5.1)
Sodium: 141 mmol/L (ref 135–145)

## 2018-08-11 MED ORDER — FOLIC ACID 1 MG PO TABS
1.0000 mg | ORAL_TABLET | Freq: Every day | ORAL | Status: DC
  Administered 2018-08-11 – 2018-08-12 (×2): 1 mg via ORAL
  Filled 2018-08-11 (×2): qty 1

## 2018-08-11 MED ORDER — CYANOCOBALAMIN 1000 MCG/ML IJ SOLN
1000.0000 ug | Freq: Once | INTRAMUSCULAR | Status: AC
  Administered 2018-08-11: 1000 ug via INTRAMUSCULAR
  Filled 2018-08-11: qty 1

## 2018-08-11 MED ORDER — POTASSIUM CHLORIDE CRYS ER 20 MEQ PO TBCR
40.0000 meq | EXTENDED_RELEASE_TABLET | Freq: Once | ORAL | Status: AC
  Administered 2018-08-11: 40 meq via ORAL
  Filled 2018-08-11: qty 2

## 2018-08-11 MED ORDER — VENLAFAXINE HCL ER 75 MG PO CP24
75.0000 mg | ORAL_CAPSULE | Freq: Every day | ORAL | Status: DC
  Administered 2018-08-12: 75 mg via ORAL
  Filled 2018-08-11: qty 1

## 2018-08-11 NOTE — Progress Notes (Signed)
PROGRESS NOTE    Barbara Stanley  ZWC:[id number] DOB: 1971-04-23 DOA: 08/08/2018 PCP: Lanae Boast, FNP    Brief Narrative:  47 year old female who presented with hypokalemia.  She does have significant past medical history for depression, gastric bypass and tobacco abuse.  Patient reported worsening diarrhea for the last 3 weeks, with nausea and poor appetite, no frank abdominal pain.  She was diagnosed with hypokalemia, failed outpatient therapy.  On the initial physical examination blood pressure 125/81, heart rate 72, respiratory 20, temperature 98.4, she had moist mucous membranes, lungs clear to auscultation bilaterally, heart S1-S2 present rhythmic, abdomen protuberant and nontender, trace lower extremity edema.  Sodium 142, potassium 2.9, chloride 106, bicarb 29, glucose 83, BUN 9, creatinine 0.68, white count 5.6, hemoglobin 9.1, Hct 38.1, platelets 40, C. difficile was negative.  Urinalysis negative for infection. Abdominal ultrasound with common bile duct in the upper limits of normal, status post cholecystectomy.  EKG sinus bradycardia, normal axis, normal intervals.  Patient was admitted to the hospital working diagnosis of severe diarrhea complicated by hypokalemia refractive to outpatient therapy    Assessment & Plan:   Active Problems:   COPD (chronic obstructive pulmonary disease) (HCC)   MDD (major depressive disorder), single episode, severe with psychosis ([place])   Hypokalemia   Thrombocytopenia ([place])  1. Diarrhea complicated with hypokalemia. K this am 3,6, will oreder 40 meq Kcl for correction, renal function with serum cr at 0,67. Patient now off IV fluids.   2. Worsening pancytopenia with severe thrombocytopenia. Wbc down to 1,6, Hb 9,4 and Plt down to 18. Suspected B12 deficiency, ordered MMA and homocysteine levels. Continue B12 correction with 1000 mcg IM daily for 7 days. Check Hep C, HIV was not reactive 07/27/2018. Add folate repletion  2. GERD. Continue  pantoprazole, tolerating well pop diet.   3. COPD. No clinical signs of exacerbation, on tiotropium and dulera  4. Depression. On haldol, trazodone and venlafaxine, has remained with no confusion or agitation.    DVT prophylaxis: heparin sq  Code Status: full Family Communication: no family at the bedside  Disposition Plan/ discharge barriers: pending clinical improvement of platelets.    Consultants:     Procedures:     Antimicrobials:   Subjective: Patient with bilateral arms ecchymosis, no hematuria or hematochezia. No nausea or vomiting, continue to have diarrhea.   Objective: Vitals:   08/10/18 2016 08/10/18 2020 08/11/18 0501 08/11/18 0920  BP: (!) 117/59  112/71   Pulse: (!) 57  (!) 52   Resp:      Temp: 98.1 F (36.7 C)  98 F (36.7 C)   TempSrc: Oral  Oral   SpO2: 96% 97% 98% 95%    Intake/Output Summary (Last 24 hours) at 08/11/2018 1012 Last data filed at 08/11/2018 0847 Gross per 24 hour  Intake 2196.86 ml  Output -  Net 2196.86 ml   There were no vitals filed for this visit.  Examination:   General: deconditioned  Neurology: Awake and alert, non focal  E ENT: positive pallor, no icterus, oral mucosa moist Cardiovascular: No JVD. S1-S2 present, rhythmic, no gallops, rubs, or murmurs. No lower extremity edema. Pulmonary: vesicular breath sounds bilaterally, adequate air movement, no wheezing, rhonchi or rales. Gastrointestinal. Abdomen with no organomegaly, non tender, no rebound or guarding Skin. No petechiae, positive ecchymosis on upper extremities.  Musculoskeletal: no joint deformities     Data Reviewed: I have personally reviewed following labs and imaging studies  CBC: Recent Labs  Lab 08/06/18 1041 08/08/18  1032 08/09/18 0350 08/10/18 0418 08/11/18 0348  WBC 2.7* 5.6 2.8* 2.6* 1.6*  NEUTROABS 1.0* 3.5  --  1.2* 0.8*  HGB 12.1 11.1* 9.2* 10.8* 9.4*  HCT 41.5 38.1 31.8* 37.2 31.7*  MCV 91.6 92.5 93.3 93.9 93.8    PLT 34* 40* 21* 22* 18*   Basic Metabolic Panel: Recent Labs  Lab 08/06/18 1041 08/08/18 1032 08/08/18 1618 08/08/18 2128 08/09/18 0350 08/10/18 0418 08/11/18 0348  NA 143 142  --   --  144 146* 141  K 3.0* 2.9*  --   --  2.8* 4.0 3.6  CL 110 106  --   --  109 110 110  CO2 24 29  --   --  29 28 29   GLUCOSE 102* 83  --   --  82 87 84  BUN 10 9  --   --  9 8 9   CREATININE 0.52 0.68  --   --  0.52 0.59 0.67  CALCIUM 7.9* 7.7*  --   --  7.2* 8.0* 7.7*  MG  --   --  1.9  --  2.5*  --   --   PHOS  --   --  3.8 3.6  --   --   --    GFR: Estimated Creatinine Clearance: 106.9 mL/min (by C-G formula based on SCr of 0.67 mg/dL). Liver Function Tests: Recent Labs  Lab 08/06/18 1041 08/08/18 1032  AST 39 57*  ALT 25 51*  ALKPHOS 88 76  BILITOT 0.8 0.9  PROT 5.5* 5.4*  ALBUMIN 3.1* 3.0*   No results for input(s): LIPASE, AMYLASE in the last 168 hours. No results for input(s): AMMONIA in the last 168 hours. Coagulation Profile: No results for input(s): INR, PROTIME in the last 168 hours. Cardiac Enzymes: No results for input(s): CKTOTAL, CKMB, CKMBINDEX, TROPONINI in the last 168 hours. BNP (last 3 results) No results for input(s): PROBNP in the last 8760 hours. HbA1C: No results for input(s): HGBA1C in the last 72 hours. CBG: No results for input(s): GLUCAP in the last 168 hours. Lipid Profile: No results for input(s): CHOL, HDL, LDLCALC, TRIG, CHOLHDL, LDLDIRECT in the last 72 hours. Thyroid Function Tests: Recent Labs    08/08/18 1905  TSH 1.773   Anemia Panel: No results for input(s): VITAMINB12, FOLATE, FERRITIN, TIBC, IRON, RETICCTPCT in the last 72 hours.    Radiology Studies: I have reviewed all of the imaging during this hospital visit personally     Scheduled Meds: . benztropine  0.5 mg Oral Daily  . cyanocobalamin  1,000 mcg Intramuscular Once  . folic acid  1 mg Oral Daily  . haloperidol  5 mg Oral Daily  . mometasone-formoterol  2 puff  Inhalation BID  . pantoprazole  40 mg Oral Daily  . potassium chloride  40 mEq Oral Once  . prazosin  1 mg Oral QHS  . tiotropium  18 mcg Inhalation Daily  . traZODone  150 mg Oral QHS  . venlafaxine XR  75 mg Oral Q breakfast   Continuous Infusions: . lactated ringers 50 mL/hr at 08/11/18 0659  . sodium chloride       LOS: 2 days        Tawni Millers, MD [facility] Pager [phone]

## 2018-08-12 ENCOUNTER — Other Ambulatory Visit: Payer: Self-pay | Admitting: Family Medicine

## 2018-08-12 DIAGNOSIS — K219 Gastro-esophageal reflux disease without esophagitis: Secondary | ICD-10-CM

## 2018-08-12 DIAGNOSIS — E538 Deficiency of other specified B group vitamins: Secondary | ICD-10-CM

## 2018-08-12 LAB — BASIC METABOLIC PANEL
Anion gap: 6 (ref 5–15)
BUN: 8 mg/dL (ref 6–20)
CO2: 30 mmol/L (ref 22–32)
Calcium: 8.2 mg/dL — ABNORMAL LOW (ref 8.9–10.3)
Chloride: 110 mmol/L (ref 98–111)
Creatinine, Ser: 0.59 mg/dL (ref 0.44–1.00)
GFR calc Af Amer: >60 mL/min (ref >60)
Glucose, Bld: 85 mg/dL (ref 70–99)
POTASSIUM: 4 mmol/L (ref 3.5–5.1)
SODIUM: 146 mmol/L — AB (ref 135–145)

## 2018-08-12 LAB — CBC WITH DIFFERENTIAL/PLATELET
Abs Immature Granulocytes: 0.01 10*3/uL (ref 0.00–0.07)
BASOS ABS: 0 10*3/uL (ref 0.0–0.1)
Basophils Relative: 1 %
EOS ABS: 0 10*3/uL (ref 0.0–0.5)
Eosinophils Relative: 1 %
HCT: 34.9 % — ABNORMAL LOW (ref 36.0–46.0)
Hemoglobin: 10.1 g/dL — ABNORMAL LOW (ref 12.0–15.0)
IMMATURE GRANULOCYTES: 1 %
LYMPHS ABS: 0.6 10*3/uL — AB (ref 0.7–4.0)
Lymphocytes Relative: 34 %
MCH: 27.5 pg (ref 26.0–34.0)
MCHC: 28.9 g/dL — ABNORMAL LOW (ref 30.0–36.0)
MCV: 95.1 fL (ref 80.0–100.0)
Monocytes Absolute: 0.2 10*3/uL (ref 0.1–1.0)
Monocytes Relative: 9 %
NEUTROS ABS: 0.9 10*3/uL — AB (ref 1.7–7.7)
NEUTROS PCT: 54 %
NRBC: 0 % (ref 0.0–0.2)
PLATELETS: 21 10*3/uL — AB (ref 150–400)
RBC: 3.67 MIL/uL — ABNORMAL LOW (ref 3.87–5.11)
RDW: 22.3 % — ABNORMAL HIGH (ref 11.5–15.5)
WBC: 1.7 10*3/uL — ABNORMAL LOW (ref 4.0–10.5)

## 2018-08-12 MED ORDER — CYANOCOBALAMIN 1000 MCG/ML IJ SOLN: 1000.0000 ug | INTRAMUSCULAR | 0 refills | Status: AC

## 2018-08-12 MED ORDER — LOPERAMIDE HCL 2 MG PO CAPS: 2.0000 mg | ORAL_CAPSULE | Freq: Four times a day (QID) | ORAL | 0 refills | Status: DC | PRN

## 2018-08-12 MED ORDER — FOLIC ACID 1 MG PO TABS: 1.0000 mg | ORAL_TABLET | Freq: Every day | ORAL | 0 refills | Status: DC

## 2018-08-12 MED ORDER — CYANOCOBALAMIN 1000 MCG/ML IJ SOLN
1000.0000 ug | Freq: Once | INTRAMUSCULAR | Status: AC
  Administered 2018-08-12: 1000 ug via INTRAMUSCULAR
  Filled 2018-08-12: qty 1

## 2018-08-12 NOTE — Progress Notes (Signed)
Reviewed discharge information with patient and husband . Answered all questions. Patient able to teach back medications and reasons to contact MD/911. Patient verbalizes importance of PCP follow up. Pt has [place] set up with [facility] home care.   Barbee [place]. Brigitte Pulse, RN .

## 2018-08-12 NOTE — Discharge Summary (Addendum)
Physician Discharge Summary  Barbara Stanley WUJ:[id number] DOB: 09-18-1971 DOA: 08/08/2018  PCP: Lanae Boast, FNP  Admit date: 08/08/2018 Discharge date: 08/12/2018  Admitted From: Home  Disposition:  Home   Recommendations for Outpatient Follow-up and new medication changes:  1. Follow up with Lanae Boast FNP in 7 days. 2. Patient has been placed on B12 1000 mcg weekly for the next 4 weeks. 3. Follow on methylmalonic acid and homocysteine urinary levels.  4. Patient started on daily folic acid. 5. Follow cell count and renal panel in 7 days.   Home Health: Yes   Equipment/Devices: no    Discharge Condition: stable  CODE STATUS: full  Diet recommendation: heart healthy   Brief/Interim Summary:  47 year old female who presented with hypokalemia. She does have the significant past medical history for depression, gastric bypass and tobacco abuse. Patient reported worsening diarrhea for the last 3 weeks, with nausea and poor appetite, no frank abdominal pain. She was diagnosed with hypokalemia, that failed outpatient therapy. On the initial physical examination blood pressure 125/81, heart rate 72, respiratory rate 20, temperature 98.4,she had moist mucous membranes, lungs clear to auscultation bilaterally, heart S1-S2 present and rhythmic, abdomen protuberant and nontender, trace lower extremity edema. Sodium 142, potassium 2.9, chloride 106, bicarb 29, glucose 83, BUN 9, creatinine 0.68, white count 5.6, hemoglobin 9.1, Hct 38.1, platelets 40,C. difficile was negative.Urinalysis negative for infection. Abdominalultrasound with common bile duct in the upper limits of normal, status post cholecystectomy.EKG sinus bradycardia, normal axis, normal intervals.  Patient was admitted to the hospital working diagnosis of severe diarrhea complicated by hypokalemia refractive to outpatient therapy  1.  Diarrhea complicated with hypokalemia.  Patient was admitted to the medical ward,  she was placed on intravenous fluids and aggressive potassium correction.  She received loperamide for diarrhea control, her symptoms improved, discharge potassium 4.0, serum creatinine 0.59, sodium 146 and bicarbonate 30.  Will recommend follow-up outpatient electrolytes within 7 days.  2.  Pancytopenia with severe thrombocytopenia.  Patient was noted to have all 3 cell lines affected, white cells, red cells and platelets.  Her MCV was 95.1, vitamin N56 213, folic acid 2.7.  Methylmalonic acid and homocystine levels were sent.  Patient received empiric vitamin B12, thousand micrograms daily for 3 days, will continue taking weekly dose of vitamin B12, follow-up as an outpatient.  Her discharge platelets are 21 up from 18, white count 1.7 and hemoglobin of 10.1/hematocrit 34.9.  Patient will follow-up as an outpatient, she does have a scheduled hematology outpatient consultation.  3.  GERD.  Continue proton pump inhibitors.  4.  COPD.  No signs of acute exacerbation, continue bronchodilator therapy.  5.  Depression.  Continue trazodone and venlafaxine.  Discharge Diagnoses:  Active Problems:   COPD (chronic obstructive pulmonary disease) (HCC)   MDD (major depressive disorder), single episode, severe with psychosis ([name])   Hypokalemia   Thrombocytopenia ([name])    Discharge Instructions   Allergies as of 08/12/2018      Reactions   Other    "Banana Bag" given to patient at 38 office      Medication List    STOP taking these medications   benztropine 0.5 MG tablet Commonly known as:  COGENTIN   haloperidol 5 MG tablet Commonly known as:  HALDOL   Potassium Chloride ER 20 MEQ Tbcr   prazosin 1 MG capsule Commonly known as:  MINIPRESS     TAKE these medications   acetaminophen 500 MG tablet Commonly known as:  TYLENOL  Take 1,000 mg by mouth daily as needed for moderate pain.   albuterol 108 (90 Base) MCG/ACT inhaler Commonly known as:  PROVENTIL HFA;VENTOLIN  HFA Inhale 2 puffs into the lungs every 6 (six) hours as needed for wheezing or shortness of breath.   budesonide-formoterol 160-4.5 MCG/ACT inhaler Commonly known as:  SYMBICORT Inhale 2 puffs into the lungs 2 (two) times daily.   cyanocobalamin 1000 MCG/ML injection Commonly known as:  (VITAMIN B-12) Inject 1 mL (1,000 mcg total) into the muscle once a week for 4 doses.   esomeprazole 40 MG capsule Commonly known as:  NEXIUM TAKE ONE CAPSULE BY MOUTH DAILY   folic acid 1 MG tablet Commonly known as:  FOLVITE Take 1 tablet (1 mg total) by mouth daily.   loperamide 2 MG capsule Commonly known as:  IMODIUM Take 1 capsule (2 mg total) by mouth 4 (four) times daily as needed for diarrhea or loose stools.   tiotropium 18 MCG inhalation capsule Commonly known as:  SPIRIVA Place 1 capsule (18 mcg total) daily into inhaler and inhale.   traZODone 150 MG tablet Commonly known as:  DESYREL Take 1 tablet (150 mg total) by mouth at bedtime. For sleep   venlafaxine XR 75 MG 24 hr capsule Commonly known as:  EFFEXOR-XR Take 1 capsule (75 mg total) by mouth daily with breakfast. For depression      Follow-up Information    Lanae Boast, FNP.   Specialty:  Family Medicine Contact information: [place] [address] [phone]          Allergies  Allergen Reactions  . Other     "Banana Bag" given to patient at 39 office    Consultations:     Procedures/Studies: Dg Ribs Bilateral W/chest  Result Date: 07/27/2018 CLINICAL DATA:  [place] 3 days ago, RIGHT posterior rib pain EXAM: BILATERAL RIBS AND CHEST - 4+ VIEW COMPARISON:  Chest radiograph 08/07/2017 FINDINGS: Normal heart size, mediastinal contours, and pulmonary vascularity. Chronic central peribronchial thickening. Small RIGHT pleural effusion. No acute infiltrate, pneumothorax, or LEFT pleural effusion. BB placed at site of symptoms lower RIGHT chest. Osseous demineralization. No rib  fracture or bone destruction identified. IMPRESSION: Small RIGHT pleural effusion. Minimal chronic bronchitic changes. Otherwise negative exam. Electronically Signed   By: Lavonia Dana M.D.   On: 07/27/2018 17:29   US Abdomen Complete  Result Date: 08/09/2018 CLINICAL DATA:  Abnormal LFTs. EXAM: ABDOMEN ULTRASOUND COMPLETE COMPARISON:  None. FINDINGS: Gallbladder: Previous cholecystectomy. Common bile duct: Diameter: 6.6 mm Liver: No focal lesion identified. Within normal limits in parenchymal echogenicity. Portal vein is patent on color Doppler imaging with normal direction of blood flow towards the liver. IVC: No abnormality visualized. Pancreas: Visualized portion unremarkable. Spleen: Spleen upper limits of normal in size measuring 12.6 cm in length with a volume of 397 cc. Right Kidney: Length: 12.3 cm. Echogenicity within normal limits. No mass or hydronephrosis visualized. Left Kidney: Length: 12.3 cm. Echogenicity within normal limits. No mass or hydronephrosis visualized. Abdominal aorta: No aneurysm visualized. Other findings: None. IMPRESSION: 1. The common bile duct is upper limits of normal in caliber at 6.6 mm. Status post cholecystectomy. If there is a clinical concern for choledocholithiasis MRCP may be helpful for further assessment. 2. Spleen upper limits of normal in size. Electronically Signed   By: Kerby Moors M.D.   On: 08/09/2018 12:28       Subjective: Patient is feeling better, anxious to be discharged, no nausea or  vomiting, diarrhea has improved, no chest pain, no bleeding.   Discharge Exam: Vitals:   08/12/18 0822 08/12/18 0824  BP:    Pulse:    Resp:    Temp:    SpO2: 98% 98%   Vitals:   08/11/18 2056 08/12/18 0516 08/12/18 0822 08/12/18 0824  BP: 134/68 121/70    Pulse: (!) 53 (!) 52    Resp: 20 20    Temp: 97.7 F (36.5 C) 97.7 F (36.5 C)    TempSrc: Oral     SpO2: 97% 96% 98% 98%    General: Not in pain or dyspnea  Neurology: Awake and alert, non  focal  E ENT: no pallor, no icterus, oral mucosa moist Cardiovascular: No JVD. S1-S2 present, rhythmic, no gallops, rubs, or murmurs. No lower extremity edema. Pulmonary: vesicular breath sounds bilaterally, adequate air movement, no wheezing, rhonchi or rales. Gastrointestinal. Abdomen protuberant, no organomegaly, non tender, no rebound or guarding Skin. No rashes Musculoskeletal: no joint deformities   The results of significant diagnostics from this hospitalization (including imaging, microbiology, ancillary and laboratory) are listed below for reference.     Microbiology: Recent Results (from the past 240 hour(s))  C difficile quick scan w PCR reflex     Status: None   Collection Time: 08/08/18  4:22 PM  Result Value Ref Range Status   C Diff antigen NEGATIVE NEGATIVE Final   C Diff toxin NEGATIVE NEGATIVE Final   C Diff interpretation No C. difficile detected.  Final    Comment: Performed at [facility], [place] [address], [place], [place] [address]     Labs: BNP (last 3 results) No results for input(s): BNP in the last 8760 hours. Basic Metabolic Panel: Recent Labs  Lab 08/08/18 1032 08/08/18 1618 08/08/18 2128 08/09/18 0350 08/10/18 0418 08/11/18 0348 08/12/18 0448  NA 142  --   --  144 146* 141 146*  K 2.9*  --   --  2.8* 4.0 3.6 4.0  CL 106  --   --  109 110 110 110  CO2 29  --   --  29 28 29 30   GLUCOSE 83  --   --  82 87 84 85  BUN 9  --   --  9 8 9 8   CREATININE 0.68  --   --  0.52 0.59 0.67 0.59  CALCIUM 7.7*  --   --  7.2* 8.0* 7.7* 8.2*  MG  --  1.9  --  2.5*  --   --   --   PHOS  --  3.8 3.6  --   --   --   --    Liver Function Tests: Recent Labs  Lab 08/06/18 1041 08/08/18 1032  AST 39 57*  ALT 25 51*  ALKPHOS 88 76  BILITOT 0.8 0.9  PROT 5.5* 5.4*  ALBUMIN 3.1* 3.0*   No results for input(s): LIPASE, AMYLASE in the last 168 hours. No results for input(s): AMMONIA in the last 168 hours. CBC: Recent Labs  Lab  08/06/18 1041 08/08/18 1032 08/09/18 0350 08/10/18 0418 08/11/18 0348 08/12/18 0448  WBC 2.7* 5.6 2.8* 2.6* 1.6* 1.7*  NEUTROABS 1.0* 3.5  --  1.2* 0.8* 0.9*  HGB 12.1 11.1* 9.2* 10.8* 9.4* 10.1*  HCT 41.5 38.1 31.8* 37.2 31.7* 34.9*  MCV 91.6 92.5 93.3 93.9 93.8 95.1  PLT 34* 40* 21* 22* 18* 21*   Cardiac Enzymes: No results for input(s): CKTOTAL, CKMB, CKMBINDEX, TROPONINI in the last  168 hours. BNP: Invalid input(s): POCBNP CBG: No results for input(s): GLUCAP in the last 168 hours. D-Dimer No results for input(s): DDIMER in the last 72 hours. Hgb A1c No results for input(s): HGBA1C in the last 72 hours. Lipid Profile No results for input(s): CHOL, HDL, LDLCALC, TRIG, CHOLHDL, LDLDIRECT in the last 72 hours. Thyroid function studies No results for input(s): TSH, T4TOTAL, T3FREE, THYROIDAB in the last 72 hours.  Invalid input(s): FREET3 Anemia work up No results for input(s): VITAMINB12, FOLATE, FERRITIN, TIBC, IRON, RETICCTPCT in the last 72 hours. Urinalysis    Component Value Date/Time   COLORURINE YELLOW 08/02/2018 1106   APPEARANCEUR CLEAR 08/02/2018 1106   LABSPEC 1.028 08/02/2018 1106   PHURINE 5.0 08/02/2018 1106   GLUCOSEU NEGATIVE 08/02/2018 1106   HGBUR NEGATIVE 08/02/2018 1106   BILIRUBINUR NEGATIVE 08/02/2018 1106   KETONESUR 5 (A) 08/02/2018 1106   PROTEINUR NEGATIVE 08/02/2018 1106   UROBILINOGEN 0.2 08/07/2017 1429   NITRITE NEGATIVE 08/02/2018 1106   LEUKOCYTESUR NEGATIVE 08/02/2018 1106   Sepsis Labs Invalid input(s): PROCALCITONIN,  WBC,  LACTICIDVEN Microbiology Recent Results (from the past 240 hour(s))  C difficile quick scan w PCR reflex     Status: None   Collection Time: 08/08/18  4:22 PM  Result Value Ref Range Status   C Diff antigen NEGATIVE NEGATIVE Final   C Diff toxin NEGATIVE NEGATIVE Final   C Diff interpretation No C. difficile detected.  Final    Comment: Performed at [facility], [address],  [place], [place] [address]     Time coordinating discharge: 45 minutes  SIGNED:   Tawni Millers, MD  [facility] 08/12/2018, 9:30 AM Pager [phone]  If 7PM-7AM, please contact night-coverage [email or web address] Password [email or web address]

## 2018-08-12 NOTE — Care Management Note (Signed)
Case Management Note  Patient Details  Name: Melida Northington MRN: [id number] Date of Birth: June 01, 1971  Subjective/Objective: Patient for d/c home-chose [facility]-rep [facility] aware for HHRN-B12 injection instruction. Has spouse,pcp,pharmacy,own transp home. No further CM needs.                   Action/Plan:dc home w/[facility]HC.   Expected Discharge Date:  08/12/18               Expected Discharge Plan:  [place]  In-House Referral:     Discharge planning Services  CM Consult  Post Acute Care Choice:    Choice offered to:  Patient  DME Arranged:    DME Agency:  [place]:  RN [facility] Agency:     Status of Service:  Completed, signed off  If discussed at [place] of Stay Meetings, dates discussed:    Additional Comments:  Dessa Phi, RN 08/12/2018, 11:11 AM

## 2018-08-13 LAB — HCV AB W REFLEX TO QUANT PCR

## 2018-08-13 LAB — HCV INTERPRETATION

## 2018-08-14 LAB — HOMOCYSTINE, URINE QUANT
Creatinine(Crt), U: 1.26 g/L (ref 0.30–3.00)
Homocysteine-per mg/g Creat: 1.4 umol/mmol cr — ABNORMAL HIGH (ref 0.1–0.8)
Homocystine, Ur: 16 umol/L — ABNORMAL HIGH (ref 0.4–13.0)

## 2018-08-15 ENCOUNTER — Inpatient Hospital Stay: Payer: Medicaid Other | Attending: Hematology and Oncology | Admitting: Hematology and Oncology

## 2018-08-15 ENCOUNTER — Encounter: Payer: Self-pay | Admitting: Hematology and Oncology

## 2018-08-15 ENCOUNTER — Telehealth: Payer: Self-pay

## 2018-08-15 ENCOUNTER — Inpatient Hospital Stay: Payer: Medicaid Other

## 2018-08-15 VITALS — BP 117/67 | HR 58 | Temp 98.9°F | Resp 19 | Ht 68.0 in | Wt 222.5 lb

## 2018-08-15 DIAGNOSIS — Z9049 Acquired absence of other specified parts of digestive tract: Secondary | ICD-10-CM | POA: Insufficient documentation

## 2018-08-15 DIAGNOSIS — Z79899 Other long term (current) drug therapy: Secondary | ICD-10-CM | POA: Diagnosis not present

## 2018-08-15 DIAGNOSIS — E876 Hypokalemia: Secondary | ICD-10-CM | POA: Diagnosis not present

## 2018-08-15 DIAGNOSIS — Z9114 Patient's other noncompliance with medication regimen: Secondary | ICD-10-CM

## 2018-08-15 DIAGNOSIS — R531 Weakness: Secondary | ICD-10-CM | POA: Diagnosis not present

## 2018-08-15 DIAGNOSIS — K219 Gastro-esophageal reflux disease without esophagitis: Secondary | ICD-10-CM

## 2018-08-15 DIAGNOSIS — R110 Nausea: Secondary | ICD-10-CM | POA: Diagnosis not present

## 2018-08-15 DIAGNOSIS — Z90710 Acquired absence of both cervix and uterus: Secondary | ICD-10-CM | POA: Insufficient documentation

## 2018-08-15 DIAGNOSIS — E538 Deficiency of other specified B group vitamins: Secondary | ICD-10-CM | POA: Diagnosis present

## 2018-08-15 DIAGNOSIS — J439 Emphysema, unspecified: Secondary | ICD-10-CM | POA: Insufficient documentation

## 2018-08-15 DIAGNOSIS — R42 Dizziness and giddiness: Secondary | ICD-10-CM

## 2018-08-15 DIAGNOSIS — Z9884 Bariatric surgery status: Secondary | ICD-10-CM | POA: Insufficient documentation

## 2018-08-15 DIAGNOSIS — F329 Major depressive disorder, single episode, unspecified: Secondary | ICD-10-CM

## 2018-08-15 DIAGNOSIS — E639 Nutritional deficiency, unspecified: Secondary | ICD-10-CM

## 2018-08-15 DIAGNOSIS — D696 Thrombocytopenia, unspecified: Secondary | ICD-10-CM

## 2018-08-15 DIAGNOSIS — R197 Diarrhea, unspecified: Secondary | ICD-10-CM | POA: Diagnosis not present

## 2018-08-15 DIAGNOSIS — G2401 Drug induced subacute dyskinesia: Secondary | ICD-10-CM

## 2018-08-15 DIAGNOSIS — F17210 Nicotine dependence, cigarettes, uncomplicated: Secondary | ICD-10-CM

## 2018-08-15 DIAGNOSIS — D61818 Other pancytopenia: Secondary | ICD-10-CM | POA: Diagnosis not present

## 2018-08-15 DIAGNOSIS — D649 Anemia, unspecified: Secondary | ICD-10-CM | POA: Diagnosis not present

## 2018-08-15 DIAGNOSIS — D619 Aplastic anemia, unspecified: Secondary | ICD-10-CM

## 2018-08-15 DIAGNOSIS — Z90722 Acquired absence of ovaries, bilateral: Secondary | ICD-10-CM

## 2018-08-15 LAB — CBC WITH DIFFERENTIAL (CANCER CENTER ONLY)
Abs Immature Granulocytes: 0.02 10*3/uL (ref 0.00–0.07)
BASOS ABS: 0 10*3/uL (ref 0.0–0.1)
BASOS PCT: 1 %
EOS PCT: 1 %
Eosinophils Absolute: 0 10*3/uL (ref 0.0–0.5)
HCT: 34.5 % — ABNORMAL LOW (ref 36.0–46.0)
HEMOGLOBIN: 10.3 g/dL — AB (ref 12.0–15.0)
Immature Granulocytes: 1 %
LYMPHS PCT: 20 %
Lymphs Abs: 0.7 10*3/uL (ref 0.7–4.0)
MCH: 27.8 pg (ref 26.0–34.0)
MCHC: 29.9 g/dL — AB (ref 30.0–36.0)
MCV: 93.2 fL (ref 80.0–100.0)
Monocytes Absolute: 0.3 10*3/uL (ref 0.1–1.0)
Monocytes Relative: 10 %
NRBC: 0 % (ref 0.0–0.2)
Neutro Abs: 2.4 10*3/uL (ref 1.7–7.7)
Neutrophils Relative %: 67 %
PLATELETS: 44 10*3/uL — AB (ref 150–400)
RBC: 3.7 MIL/uL — AB (ref 3.87–5.11)
RDW: 22.5 % — AB (ref 11.5–15.5)
WBC: 3.4 10*3/uL — AB (ref 4.0–10.5)

## 2018-08-15 LAB — COMPREHENSIVE METABOLIC PANEL
ALT: 26 U/L (ref 0–44)
AST: 20 U/L (ref 15–41)
Albumin: 2.8 g/dL — ABNORMAL LOW (ref 3.5–5.0)
Alkaline Phosphatase: 88 U/L (ref 38–126)
Anion gap: 5 (ref 5–15)
BUN: 8 mg/dL (ref 6–20)
CO2: 30 mmol/L (ref 22–32)
Calcium: 8 mg/dL — ABNORMAL LOW (ref 8.9–10.3)
Chloride: 108 mmol/L (ref 98–111)
Creatinine, Ser: 0.64 mg/dL (ref 0.44–1.00)
Glucose, Bld: 76 mg/dL (ref 70–99)
POTASSIUM: 3.7 mmol/L (ref 3.5–5.1)
Sodium: 143 mmol/L (ref 135–145)
Total Bilirubin: 0.9 mg/dL (ref 0.3–1.2)
Total Protein: 5 g/dL — ABNORMAL LOW (ref 6.5–8.1)

## 2018-08-15 LAB — RETIC PANEL
Immature Retic Fract: 23.4 % — ABNORMAL HIGH (ref 2.3–15.9)
RBC.: 3.7 MIL/uL — AB (ref 3.87–5.11)
Retic Count, Absolute: 96.6 10*3/uL (ref 19.0–186.0)
Retic Ct Pct: 2.6 % (ref 0.4–3.1)
Reticulocyte Hemoglobin: 35.2 pg (ref >27.9)

## 2018-08-15 LAB — METHYLMALONIC ACID(MMA), RND URINE
CREATININE(CRT), U: 1.45 g/L (ref 0.30–3.00)
MMA - NORMALIZED: 3.8 umol/mmol{creat} — AB (ref 0.5–3.4)
Methylmalonic Acid, Ur: 48.2 umol/L — ABNORMAL HIGH (ref 1.6–29.7)

## 2018-08-15 LAB — FOLATE: FOLATE: 4 ng/mL — AB (ref >5.9)

## 2018-08-15 LAB — VITAMIN B12: VITAMIN B 12: 911 pg/mL (ref 180–914)

## 2018-08-15 LAB — LACTATE DEHYDROGENASE: LDH: 177 U/L (ref 98–192)

## 2018-08-15 LAB — URIC ACID: Uric Acid, Serum: 4.8 mg/dL (ref 2.5–7.1)

## 2018-08-15 MED ORDER — CYANOCOBALAMIN 1000 MCG/ML IJ SOLN
1000.0000 ug | Freq: Once | INTRAMUSCULAR | Status: AC
  Administered 2018-08-15: 1000 ug via INTRAMUSCULAR

## 2018-08-15 MED ORDER — CYANOCOBALAMIN 1000 MCG/ML IJ SOLN
INTRAMUSCULAR | Status: AC
  Filled 2018-08-15: qty 1

## 2018-08-15 NOTE — Telephone Encounter (Signed)
Printed avs and calender of upcoming appointment. Per 10/30 los 

## 2018-08-15 NOTE — Patient Instructions (Addendum)
We discussed in detail the results of your laboratory studies both in the hospital and currently.  All of your blood counts are still low.  Your platelet count is very low.  Avoid any high risk behavior that might put you at risk for falling.  When your platelet count is low you can bleed spontaneously.  Go to the emergency department if you have any bleeding which does not stop almost immediately.  Be aware that any sign of fever, cough, cold, or sore throat should prompt a phone call to notify us immediately.  Infection when your white blood cell count is low is a serious problem and can be potentially life-threatening.  If you have any serious problems suggestive of infection after hours, go to the emergency room for immediate care.  Deliver of the stool specimen to the [facility] lab immediately when collected.  Do not contaminated with urine.  Vitamin B12 was administered by injection today in the Clinic.  You are scheduled to receive vitamin B12: 1000 mcg additionally on Thursday and Friday (October 31, November 1) in the [place].  On Friday, November 1, a follow-up appointment has been scheduled at 2:30 PM.  Come to [place] at least 30 minutes early to obtain blood work for our review.  Thank you! Ladona Ridgel, MD Hematology/Oncology

## 2018-08-15 NOTE — Progress Notes (Signed)
[facility] Initial Consultation  Patient Name:  Barbara Stanley  DOB: Feb 26, 1971   Date of Service: August 15, 2018  Referring Provider: Lanae Boast, [place], [place] [address]   Consulting Physician: Henreitta Leber, MD Hematology/Oncology  Reason for Referral: In the setting of pancytopenia, discharged from [facility] on October 27; started on vitamin L97 and folic acid for presumptive severe pancytopenia secondary to undernutrition and vitamin deficiencies; noncompliant with medication, she presents now for further diagnostic and therapeutic recommendations.  History Present Illness: Barbara Stanley is a 47 year old resident of [place], originally from [place], whose past medical history is significant for chronic obstructive pulmonary disease; major depression; gastric bypass surgery 16 years ago; unilateral right hearing deficit; chronic anemia; gastroesophageal reflux disease; active tobacco dependence; degenerative joint disease involving the left shoulder, lower back, and knees bilaterally.  Her primary care provider is Lanae Boast, nurse practitioner.  She is accompanied by her husband Annalee Genta.  On August 08, 2018 she presented to the emergency department at [facility] after being sent by her primary care provider due to hypokalemia and weakness.  Her serum potassium was 2.9.  She complained also of diarrhea extending over 3-week period.  She had been to the emergency department on October 17 where she was given intravenous and oral potassium.  When her labs on the outside were found to be abnormal, she was sent to the emergency department for further evaluation.  She just recently completed a course of moxifloxacin for possible pneumonia.  She denied any recent travel outside the country. She has never had Clostritium difficile cytotoxin colitis.  She has no history of  inflammatory bowel disease, viral hepatitis, or diverticulosis.  Throughout her hospital stay, she was given intravenous fluid resuscitation and electrolyte replacement.  Laboratory studies performed suggested severe pancytopenia and vitamin Q73 and folic acid deficiency.   She was given vitamin B12: 1000 mcg IM daily for 3 days. Folic acid: 1 mg once daily was supplemented.  When she steadily improved, she was discharged on October 27.  A prescription for esomeprazole was written: esomeprazole: 40 mg once daily.  An appointment with [facility], was scheduled for November 20.  She has never had a screening colonoscopy.  Although home health was to arrange vitamin B12 injections at home, her insurance would not provide coverage.  Due to financial constrictions, they have been unable to obtain folic acid.  She has no diabetes mellitus, hypertension, or coronary artery disease.  She denies seizure disorder or stroke syndrome.  She has no thyroid disease.  There is no known peptic ulcer disease.  She has had gastroesophageal reflux disease over a lengthy but indefinite period.  She states that she has had bowel problems since her teenage years.  She normally moves her bowels 5-6 times daily but extremely loose.  Over the past several weeks however she has had bowel movements every hour (20 BMs daily) during this acute period.  While hospitalized she was given loperamide.  Since her discharge, she has not taken any loperamide.  She currently moves her bowels 10 times daily described as semi-formed almost watery.  In the past she was on iron supplementation.  She vaguely remembers following her gastric bypass that vitamin A19 and folic acid were recommended.  She reports no melena or bright red blood per rectum.  Her energy level is extremely low. Her appetite is diminished while her weight remains stable. She  has no unusual headaches, syncope, near syncopal episodes. She does have positional  dizziness.  There is no rash or itching.  She has no cough, sore throat, orthopnea.  She denies dyspnea either at rest or on minimal exertion.  She denies pain or difficulty in swallowing. No fever, shaking chills, sweats, or flulike symptoms are reported.  She has dyspepsia which has improved significantly since starting esomeprazole.  She denies constipation.  She has almost daily nausea without vomiting.  No urinary frequency, urgency, hematuria, dysuria are reported.  She has no swelling of her ankles.  There are no new arthralgias or myalgias.  She denies bleeding tendency or easy bruisability.  She has no peripheral arterial venous thromboembolic disease.  There is intermittent tingling of her left hand and feet bilaterally.  It is with this background she presents now for further diagnostic and therapeutic recommendations in the setting of pancytopenia likely secondary to undernutrition and multiple vitamin deficiencies as outlined above.  Past Medical History:  Diagnosis Date  . Anemia   . COPD (chronic obstructive pulmonary disease) ([place])   . Depression   . Emphysema   . GERD (gastroesophageal reflux disease)   . Tobacco abuse    Past Surgical History:  Procedure Laterality Date  . ABDOMINAL HYSTERECTOMY    . c section x 3     . CHOLECYSTECTOMY    . GASTRIC BYPASS     Gynecologic History: Her menarche was at age 21 years.  Her last menstruation was at age 68 years. In 2013 she had a hysterectomy and unilateral oophorectomy. She has never been on hormone replacement therapy. She is a gravida 4 para 1 miscarriage 1. Her first pregnancy was at age 29 years. Her last screening mammogram was 7 years earlier. She has never had a breast biopsy. Her last Pap smear was 16 years ago.  Family History  Problem Relation Age of Onset  . Diabetes Mother   . Diabetes Other   . Hypertension Other   . CAD Other    Social History   Socioeconomic History  . Marital status: Single     Spouse name: Not on file  . Number of children: Not on file  . Years of education: Not on file  . Highest education level: Not on file  Occupational History  . Not on file  Social Needs  . Financial resource strain: Not on file  . Food insecurity:    Worry: Not on file    Inability: Not on file  . Transportation needs:    Medical: Not on file    Non-medical: Not on file  Tobacco Use  . Smoking status: Current Every Day Smoker    Packs/day: 1.00    Years: 1.00    Pack years: 1.00    Types: Cigarettes  . Smokeless tobacco: Never Used  Substance and Sexual Activity  . Alcohol use: No    Comment: occas  . Drug use: No  . Sexual activity: Yes    Birth control/protection: None  Lifestyle  . Physical activity:    Days per week: Not on file    Minutes per session: Not on file  . Stress: Not on file  Relationships  . Social connections:    Talks on phone: Not on file    Gets together: Not on file    Attends religious service: Not on file    Active member of club or organization: Not on file    Attends meetings of clubs or organizations:  Not on file    Relationship status: Not on file  . Intimate partner violence:    Fear of current or ex partner: Not on file    Emotionally abused: Not on file    Physically abused: Not on file    Forced sexual activity: Not on file  Other Topics Concern  . Not on file  Social History Narrative  . Not on file  Nataliah is married for the past 3 years. She is not employed. She has 3 healthy children: Ages 84 to 10 years. She began smoking at the age of 5 years. She smoked a maximum 1 pack of cigarettes daily. She continues to smoke 1 pack of cigarettes daily. Her alcohol intake is rare, never heavy. She reports no recreational drug use.  Transfusion History: She was transfused in 2013 at the time of her hysterectomy.  Exposure History: She has no known exposure to toxic chemicals, radiation, or pesticides.  Allergies  Allergen  Reactions  . Other     "Banana Bag" given to patient at physician's office  She has no food allergies. She has nonspecific seasonal allergies.  Current Outpatient Medications on File Prior to Visit  Medication Sig  . acetaminophen (TYLENOL) 500 MG tablet Take 1,000 mg by mouth daily as needed for moderate pain.  [name] albuterol (PROAIR HFA) 108 (90 Base) MCG/ACT inhaler Inhale 2 puffs into the lungs every 6 (six) hours as needed for wheezing or shortness of breath.  . budesonide-formoterol (SYMBICORT) 160-4.5 MCG/ACT inhaler Inhale 2 puffs into the lungs 2 (two) times daily.  . cyanocobalamin (,VITAMIN B-12,) 1000 MCG/ML injection Inject 1 mL (1,000 mcg total) into the muscle once a week for 4 doses.  [name] esomeprazole (NEXIUM) 40 MG capsule TAKE 1 CAPSULE BY MOUTH EVERY DAY  . tiotropium (SPIRIVA) 18 MCG inhalation capsule Place 1 capsule (18 mcg total) daily into inhaler and inhale.  . traZODone (DESYREL) 150 MG tablet Take 1 tablet (150 mg total) by mouth at bedtime. For sleep  . venlafaxine XR (EFFEXOR-XR) 75 MG 24 hr capsule Take 1 capsule (75 mg total) by mouth daily with breakfast. For depression  . folic acid (FOLVITE) 1 MG tablet Take 1 tablet (1 mg total) by mouth daily. (Patient not taking: Reported on 08/15/2018)  . loperamide (IMODIUM) 2 MG capsule Take 1 capsule (2 mg total) by mouth 4 (four) times daily as needed for diarrhea or loose stools. (Patient not taking: Reported on 08/15/2018)   No current facility-administered medications on file prior to visit.     Review of Systems: Constitutional: No fever, sweats, or shaking chills.  Poor appetite without significant weight loss.  Skin: No rash, scaling, sores, lumps, or jaundice. HEENT: No visual changes; unilateral right hearing deficit. Pulmonary: No unusual cough, sore throat, or orthopnea; active tobacco dependence. COPD. Breasts: No complaints; she is scheduled for mammogram. Cardiovascular: No coronary artery disease, angina,  or myocardial infarction.  No cardiac dysrhythmia, essential hypertension, or dyslipidemia. Gastrointestinal: No indigestion, dysphagia, abdominal pain, or constipation.  Chronic diarrhea loose/semi-formed/watery, numbering 10-20 daily with mild abdominal cramps; no tenesmus; now worsening without melena or bright red blood per rectum.  GERD.  Daily nausea without vomiting.  She denies melena or bright red blood per rectum. GI evaluation scheduled. Genitourinary: No urinary frequency, urgency, hematuria, or dysuria. Musculoskeletal: DJD: Left shoulder, lower back, and knees bilaterally. No new arthralgias or myalgias; no joint swelling, pain, or instability. Hematologic: No bleeding tendency or easy bruisability. Endocrine: No intolerance to heat  or cold; no thyroid disease or diabetes mellitus. Vascular: No peripheral arterial or venous thromboembolic disease. Psychological: Major depression, or mood changes; no mental health illnesses. Neurological: No dizziness, lightheadedness, syncope, or near syncopal episodes; no numbness or tingling in the fingers or toes.  Physical Examination: Vital Signs: Body surface area is 2.2 meters squared.  Vitals:   08/15/18 1301  BP: 117/67  Pulse: (!) 58  Resp: 19  Temp: 98.9 F (37.2 C)  SpO2: 99%    Filed Weights   08/15/18 1301  Weight: 222 lb 8 oz (100.9 kg)  ECOG PERFORMANCE STATUS: 1-2 Constitutional:  Aryahi Denzler is fully nourished and developed albeit overweight.  She looks chronically ill and older than her stated age.  She is friendly and cooperative without respiratory compromise at rest. Skin: No rashes, scaling, dryness, jaundice, or itching; pallor. HEENT: Head is normocephalic and atraumatic.  Pupils are equal round and reactive to light and accommodation.  Sclerae are anicteric. Conjunctivae are pale. No sinus tenderness nor oropharyngeal lesions.  Lips without cracking or peeling; tongue without mass, inflammation, or nodularity.   Mucous membranes are moist. Neck: Supple and symmetric.  No jugular venous distention or thyromegaly.  Trachea is midline. Lymphatics: No cervical or supraclavicular lymphadenopathy.  No epitrochlear, axillary, or inguinal lymphadenopathy is appreciated. Respiratory/chest: Thorax is symmetrical.  Breath sounds are clear to auscultation and percussion.  Normal excursion and respiratory effort. Back: Symmetric without deformity or tenderness. Cardiovascular: Heart rate and rhythm are regular without murmurs. Gastrointestinal: Abdomen is soft, nontender; no organomegaly.  Bowel sounds are normoactive.  No masses are appreciated. Rectal examination: Not performed. Extremities: In the lower extremities, there is no asymmetric swelling, erythema, tenderness, or cord formation. No clubbing or cyanosis.  Her ankles are thick with trace pitting edema. Hematologic: No petechiae, hematomas, or ecchymoses. Psychological:  She is oriented to person, place, and time; normal affect; memory and cognitive deficit.  Neurological: Facial twitching and tongue rolling consistent with tardive dyskinesia. There are no gross sensory or motor deficits.   Laboratory Results: I have reviewed the data as listed:  Ref Range & Units 14:14 3d ago 4d ago 5d ago 6d ago  WBC Count 4.0 - 10.5 K/uL 3.4Low   1.7Low   1.6Low   2.6Low   2.8Low    RBC 3.87 - 5.11 MIL/uL 3.70Low   3.67Low   3.38Low   3.96  3.41Low    Hemoglobin 12.0 - 15.0 g/dL 10.3Low   10.1Low   9.4Low   10.8Low   9.2Low    HCT 36.0 - 46.0 % 34.5Low   34.9Low   31.7Low   37.2  31.8Low    MCV 80.0 - 100.0 fL 93.2  95.1  93.8  93.9  93.3   MCH 26.0 - 34.0 pg 27.8  27.5  27.8  27.3  27.0   MCHC 30.0 - 36.0 g/dL 29.9Low   28.9Low   29.7Low   29.0Low   28.9Low    RDW 11.5 - 15.5 % 22.5High   22.3High   21.9High   21.8High   21.0High    Platelet Count 150 - 400 K/uL 44Low   21Low Panic  CM 18Low Panic  CM 22Low Panic  CM 21Low Panic  CM  Comment: Immature Platelet  Fraction may be  clinically indicated, consider  ordering this additional test  [id number]   nRBC 0.0 - 0.2 % 0.0  0.0  0.0  0.0  0.0 CM  Neutrophils Relative % % 67  54  47  45    Neutro Abs 1.7 - 7.7 K/uL 2.4  0.9Low   0.8Low   1.2Low     Lymphocytes Relative % 20  34  42  46    Lymphs Abs 0.7 - 4.0 K/uL 0.7  0.6Low   0.7  1.2    Monocytes Relative % _0 Monocytes Absolute 0.1 - 1.0 K/uL 0.3  0.2  0.1  0.2    Eosinophils Relative % _1 Eosinophils Absolute 0.0 - 0.5 K/uL 0.0  0.0  0.0  0.0    Basophils Relative % _2 0    Basophils Absolute 0.0 - 0.1 K/uL 0.0  0.0  0.0  0.0    Immature Granulocytes % _3 Abs Immature Granulocytes 0.00 - 0.07 K/uL 0.02  0.01  0.01  0.02 CM   Comment: Performed at [facility], [place] [address], [place], [place] [address]  Polychromasia   PRESENT  PRESENT     Ovalocytes   PRESENT CM PRESENT CM      Ref Range & Units 14:13 3d ago 4d ago 5d ago 6d ago  Sodium 135 - 145 mmol/L 143  146High   141  146High   144   Potassium 3.5 - 5.1 mmol/L 3.7  4.0  3.6  4.0 CM 2.8Low    Chloride 98 - 111 mmol/L 108  110  110  110  109   CO2 22 - 32 mmol/L _4 Glucose, Bld 70 - 99 mg/dL 76  85  84  87  82   BUN 6 - 20 mg/dL _5 Creatinine, Ser 0.44 - 1.00 mg/dL 0.64  0.59  0.67  0.59  0.52   Calcium 8.9 - 10.3 mg/dL 8.0Low   8.2Low   7.7Low   8.0Low   7.2Low    Total Protein 6.5 - 8.1 g/dL 5.0Low        Albumin 3.5 - 5.0 g/dL 2.8Low        AST 15 - 41 U/L 20       ALT 0 - 44 U/L 26       Alkaline Phosphatase 38 - 126 U/L 88       Total Bilirubin 0.3 - 1.2 mg/dL 0.9       GFR calc non Af Amer >60 mL/min >60  >60  >60  >60  >60   GFR calc Af Amer >60 mL/min >60  >60 CM >60 CM >60 CM >60 CM  Comment: (NOTE)     Ref Range & Units 14:14  Retic Ct Pct 0.4 - 3.1 % 2.6   RBC. 3.87 - 5.11 MIL/uL 3.70Low    Retic Count, Absolute 19.0 - 186.0 K/uL 96.6   Immature Retic Fract 2.3 - 15.9  % 23.4High    Reticulocyte Hemoglobin >27.9 pg 35.2   Comment:     Given the high negative predictive  value of a RET-He result > 32 pg  iron deficiency is essentially  excluded. If this patient is anemic  other etiologies should be  considered.   Uric acid 4.8 LDH 510 Folic acid 4.0 Vitamin B12 911  Diagnostic/Imaging Studies: August 09, 2018 ABDOMEN ULTRASOUND COMPLETE  COMPARISON:  None.  FINDINGS:  Gallbladder: Previous cholecystectomy.  Common bile duct: Diameter: 6.6 mm  Liver: No focal lesion identified. Within normal limits in parenchymal echogenicity. Portal vein is patent on color Doppler imaging with normal direction of blood flow towards the liver.  IVC: No abnormality visualized.  Pancreas: Visualized portion unremarkable.  Spleen: Spleen upper limits of normal in size measuring 12.6 cm in length with a volume of 397 cc.  Right Kidney: Length: 12.3 cm. Echogenicity within normal limits. No mass or hydronephrosis visualized.  Left Kidney: Length: 12.3 cm. Echogenicity within normal limits. No mass or hydronephrosis visualized.  Abdominal aorta: No aneurysm visualized.  Other findings: None.  IMPRESSION: 1. The common bile duct is upper limits of normal in caliber at 6.6 mm. Status post cholecystectomy. If there is a clinical concern for choledocholithiasis MRCP may be helpful for further assessment. 2. Spleen upper limits of normal in size.  Kerby Moors M.D. 08/09/2018 12:28  Summary/Assessment: In the setting of pancytopenia, discharged from [facility] on October 27; started on vitamin W09 and folic acid for presumptive severe pancytopenia secondary to undernutrition and vitamin deficiencies; noncompliant with medication, she presents now for further diagnostic and therapeutic recommendations.  On August 08, 2018 she presented to the emergency department at [facility] after being sent  by her primary care provider due to hypokalemia and weakness.  Her serum potassium was 2.9.  She complained also of diarrhea extending over 3-week period.  She had been to the emergency department on October 17 where she was given intravenous and oral potassium.  When her labs on the outside were found to be abnormal, she was sent to the emergency department for further evaluation.  She just recently completed a course of moxifloxacin for possible pneumonia. She denied any recent travel outside the country. She has never had Clostritium difficile colitis. She has no history of inflammatory bowel disease, viral hepatitis, or diverticulosis.  Throughout her hospital stay, she was given intravenous fluid resuscitation and electrolyte replacement.  Laboratory studies performed suggested severe pancytopenia and vitamin W11 and folic acid deficiency.  She was given vitamin B12: 1000 mcg IM daily for 3 days. Folic acid: 1 mg once daily was supplemented. When she steadily improved, she was discharged on October 27.  A prescription for esomeprazole: 40 mg once daily was written.  An appointment with [facility], was scheduled for November 20. She has never had a screening colonoscopy.  Although home health was to arrange vitamin B12 injections at home, her insurance would not provide coverage.  Due to financial constrictions, they have been unable to obtain folic acid.  She has no diabetes mellitus, hypertension, or coronary artery disease.  She denies seizure disorder or stroke syndrome.  She has no thyroid disease.  There is no known peptic ulcer disease.  She has had gastroesophageal reflux disease over a lengthy but indefinite period.  She states that she has had bowel problems since her teenage years.  She normally moves her bowels 5-6 times daily but extremely loose.  Over the past several weeks however she has had bowel movements every hour (20 BMs daily) during this acute period.  While hospitalized  she was given loperamide.  Since her discharge, she has not taken any loperamide.  She currently moves her bowels 10 times daily described as semi-formed almost watery.  In the past she was on iron supplementation.  She vaguely remembers following her gastric bypass that vitamin B14 and folic acid were recommended.  She  reports no melena or bright red blood per rectum.  Her energy level is extremely low. Her appetite is diminished while her weight remains stable. She has no unusual headaches, syncope, near syncopal episodes. She does have positional dizziness.  There is no rash or itching. She has no cough, sore throat, orthopnea.  She denies dyspnea either at rest or on minimal exertion. She denies pain or difficulty in swallowing.  No fever, shaking chills, sweats, or flulike symptoms are reported.  She has dyspepsia which has improved significantly since starting esomeprazole.  She denies constipation. She has almost daily nausea without vomiting.  No urinary frequency, urgency, hematuria, dysuria are reported.  She has no swelling of her ankles. There are no new arthralgias or myalgias.  She denies bleeding tendency or easy bruisability. She has no peripheral arterial venous thromboembolic disease.  There is intermittent tingling of her left hand and feet bilaterally.    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  Recommendation/Plan: I had a lengthy discussion with Cherlyn Roberts and her husband Annalee Genta regarding the laboratory studies both while hospitalized and in the Clinic.  Arrangements are underway to have vitamin B12: 1000 mcg IM given daily in the [facility] for 3 consecutive days.  It was recommended that they  obtain folic acid: 1 mg once daily by prescription.  If unable to obtain it financially, they were advised to obtain folic acid: 563 mcg by mouth: 3 tablets daily in divided doses from either a health food store or vitamin shop.  A consultation with social service has been requested.  Those arrangements are underway.  A stool specimen was requested for repeat C. difficile cytotoxin molecular, enteropathic pathogens, fecal leukocytes, and Helicobacter pylori.  They were instructed on collection and delivery of the specimen.  The results of her laboratory studies were not available at the time of discharge.  Some of those results are outlined above.  Although the reticulocyte panel suggest iron deficiency, formal iron studies are still pending. She was encouraged to continue esomeprazole as previously requested.  A follow-up appointment has been scheduled on November 1 following laboratory studies, and vitamin B12 injection to assure her hematologic and clinical stability.  If her blood counts continue to improve, she can be switched to vitamin B12 on a once weekly basis for 4 weeks followed by once monthly thereafter.  It is strongly recommended that she follow through with all of her anticipated consultations and screening studies. Her appointment with gastroenterology is now scheduled for November 20.  The total time spent discussing the prior laboratory studies, importance, role, and rationale for identifying the cause of her pancytopenia, replacement schedule for vitamin J49 and folic acid, preliminary considerations and recommendations was 70 minutes.  At least 50% of that time was spent in face-to-face discussion, physical examination, reviewing records, laboratory studies, counseling, and answering questions. All questions were answered to her satisfaction.  This note was dictated using voice activated technology/software.  Unfortunately, typographical errors are not uncommon, and transcription  is subject to mistakes and regrettably misinterpretation.  If necessary, clarification of the above information can be discussed with me at any time.  Thank you Venora Maples for allowing my participation in the care of Royanne Warshaw. I will keep you closely informed as the results of her complete preliminary laboratory data  become available.  Please do not hesitate to call should any questions arise regarding this initial consultation and discussion.  FOLLOW UP: AS DIRECTED   cc:      Lanae Boast, FNP   Henreitta Leber, MD  Hematology/Oncology [place] [address] [place], [place] [address] Office: [phone] CODI: [phone]

## 2018-08-16 ENCOUNTER — Other Ambulatory Visit: Payer: Self-pay

## 2018-08-16 ENCOUNTER — Inpatient Hospital Stay: Payer: Medicaid Other

## 2018-08-16 VITALS — BP 121/52 | HR 59 | Temp 98.5°F | Resp 18

## 2018-08-16 DIAGNOSIS — K219 Gastro-esophageal reflux disease without esophagitis: Secondary | ICD-10-CM

## 2018-08-16 DIAGNOSIS — D696 Thrombocytopenia, unspecified: Secondary | ICD-10-CM

## 2018-08-16 DIAGNOSIS — E538 Deficiency of other specified B group vitamins: Secondary | ICD-10-CM | POA: Diagnosis not present

## 2018-08-16 DIAGNOSIS — D619 Aplastic anemia, unspecified: Secondary | ICD-10-CM

## 2018-08-16 LAB — HAPTOGLOBIN: Haptoglobin: 72 mg/dL (ref 34–200)

## 2018-08-16 LAB — LACTOFERRIN, FECAL, QUALITATIVE: LACTOFERRIN, FECAL, QUAL: POSITIVE — AB

## 2018-08-16 LAB — FERRITIN: Ferritin: 25 ng/mL (ref 11–307)

## 2018-08-16 LAB — C DIFFICILE QUICK SCREEN W PCR REFLEX
C DIFFICILE (CDIFF) INTERP: NOT DETECTED
C Diff antigen: NEGATIVE
C Diff toxin: NEGATIVE

## 2018-08-16 LAB — IRON AND TIBC
IRON: 54 ug/dL (ref 41–142)
SATURATION RATIOS: 14 % — AB (ref 21–57)
TIBC: 383 ug/dL (ref 236–444)
UIBC: 328 ug/dL (ref 120–384)

## 2018-08-16 MED ORDER — CYANOCOBALAMIN 1000 MCG/ML IJ SOLN
1000.0000 ug | Freq: Once | INTRAMUSCULAR | Status: AC
  Administered 2018-08-16: 1000 ug via INTRAMUSCULAR

## 2018-08-16 MED ORDER — CYANOCOBALAMIN 1000 MCG/ML IJ SOLN
INTRAMUSCULAR | Status: AC
  Filled 2018-08-16: qty 1

## 2018-08-16 MED ORDER — CYANOCOBALAMIN 1000 MCG/ML IJ SOLN: 1000.0000 ug | Freq: Once | INTRAMUSCULAR | Status: DC

## 2018-08-16 NOTE — Patient Instructions (Signed)
Cyanocobalamin, Vitamin B12 injection What is this medicine? CYANOCOBALAMIN (sye an oh koe BAL a min) is a man made form of vitamin B12. Vitamin B12 is used in the growth of healthy blood cells, nerve cells, and proteins in the body. It also helps with the metabolism of fats and carbohydrates. This medicine is used to treat people who can not absorb vitamin B12. This medicine may be used for other purposes; ask your health care provider or pharmacist if you have questions. COMMON BRAND NAME(S): B-12 Compliance Kit, B-12 Injection Kit, Cyomin, LA-12, Nutri-Twelve, Physicians EZ Use B-12, Primabalt What should I tell my health care provider before I take this medicine? They need to know if you have any of these conditions: -kidney disease -Leber's disease -megaloblastic anemia -an unusual or allergic reaction to cyanocobalamin, cobalt, other medicines, foods, dyes, or preservatives -pregnant or trying to get pregnant -breast-feeding How should I use this medicine? This medicine is injected into a muscle or deeply under the skin. It is usually given by a health care professional in a clinic or doctor's office. However, your doctor may teach you how to inject yourself. Follow all instructions. Talk to your pediatrician regarding the use of this medicine in children. Special care may be needed. Overdosage: If you think you have taken too much of this medicine contact a poison control center or emergency room at once. NOTE: This medicine is only for you. Do not share this medicine with others. What if I miss a dose? If you are given your dose at a clinic or doctor's office, call to reschedule your appointment. If you give your own injections and you miss a dose, take it as soon as you can. If it is almost time for your next dose, take only that dose. Do not take double or extra doses. What may interact with this medicine? -colchicine -heavy alcohol intake This list may not describe all possible  interactions. Give your health care provider a list of all the medicines, herbs, non-prescription drugs, or dietary supplements you use. Also tell them if you smoke, drink alcohol, or use illegal drugs. Some items may interact with your medicine. What should I watch for while using this medicine? Visit your doctor or health care professional regularly. You may need blood work done while you are taking this medicine. You may need to follow a special diet. Talk to your doctor. Limit your alcohol intake and avoid smoking to get the best benefit. What side effects may I notice from receiving this medicine? Side effects that you should report to your doctor or health care professional as soon as possible: -allergic reactions like skin rash, itching or hives, swelling of the face, lips, or tongue -blue tint to skin -chest tightness, pain -difficulty breathing, wheezing -dizziness -red, swollen painful area on the leg Side effects that usually do not require medical attention (report to your doctor or health care professional if they continue or are bothersome): -diarrhea -headache This list may not describe all possible side effects. Call your doctor for medical advice about side effects. You may report side effects to FDA at 1-800-FDA-1088. Where should I keep my medicine? Keep out of the reach of children. Store at room temperature between 15 and 30 degrees C (59 and 85 degrees F). Protect from light. Throw away any unused medicine after the expiration date. NOTE: This sheet is a summary. It may not cover all possible information. If you have questions about this medicine, talk to your doctor, pharmacist, or   health care provider.  2018 Elsevier/Gold Standard (2008-01-14 22:10:20)  

## 2018-08-17 ENCOUNTER — Inpatient Hospital Stay: Payer: Medicaid Other | Attending: Hematology and Oncology | Admitting: Hematology and Oncology

## 2018-08-17 ENCOUNTER — Other Ambulatory Visit: Payer: Self-pay

## 2018-08-17 ENCOUNTER — Encounter: Payer: Self-pay | Admitting: Hematology and Oncology

## 2018-08-17 ENCOUNTER — Inpatient Hospital Stay: Payer: Medicaid Other

## 2018-08-17 VITALS — BP 119/57 | HR 61 | Temp 98.2°F | Resp 18 | Wt 219.1 lb

## 2018-08-17 DIAGNOSIS — R197 Diarrhea, unspecified: Secondary | ICD-10-CM | POA: Insufficient documentation

## 2018-08-17 DIAGNOSIS — D696 Thrombocytopenia, unspecified: Secondary | ICD-10-CM | POA: Insufficient documentation

## 2018-08-17 DIAGNOSIS — D509 Iron deficiency anemia, unspecified: Secondary | ICD-10-CM | POA: Insufficient documentation

## 2018-08-17 DIAGNOSIS — D469 Myelodysplastic syndrome, unspecified: Secondary | ICD-10-CM

## 2018-08-17 DIAGNOSIS — K219 Gastro-esophageal reflux disease without esophagitis: Secondary | ICD-10-CM

## 2018-08-17 DIAGNOSIS — E538 Deficiency of other specified B group vitamins: Secondary | ICD-10-CM | POA: Insufficient documentation

## 2018-08-17 DIAGNOSIS — F17210 Nicotine dependence, cigarettes, uncomplicated: Secondary | ICD-10-CM | POA: Diagnosis not present

## 2018-08-17 DIAGNOSIS — D61818 Other pancytopenia: Secondary | ICD-10-CM | POA: Diagnosis not present

## 2018-08-17 DIAGNOSIS — E639 Nutritional deficiency, unspecified: Secondary | ICD-10-CM | POA: Insufficient documentation

## 2018-08-17 DIAGNOSIS — D619 Aplastic anemia, unspecified: Secondary | ICD-10-CM

## 2018-08-17 LAB — CBC WITH DIFFERENTIAL (CANCER CENTER ONLY)
ABS IMMATURE GRANULOCYTES: 0.02 10*3/uL (ref 0.00–0.07)
BASOS ABS: 0 10*3/uL (ref 0.0–0.1)
BASOS PCT: 1 %
Eosinophils Absolute: 0 10*3/uL (ref 0.0–0.5)
Eosinophils Relative: 1 %
HCT: 35.4 % — ABNORMAL LOW (ref 36.0–46.0)
HEMOGLOBIN: 10.6 g/dL — AB (ref 12.0–15.0)
Immature Granulocytes: 1 %
LYMPHS PCT: 22 %
Lymphs Abs: 0.8 10*3/uL (ref 0.7–4.0)
MCH: 28.4 pg (ref 26.0–34.0)
MCHC: 29.9 g/dL — AB (ref 30.0–36.0)
MCV: 94.9 fL (ref 80.0–100.0)
Monocytes Absolute: 0.3 10*3/uL (ref 0.1–1.0)
Monocytes Relative: 10 %
NEUTROS ABS: 2.3 10*3/uL (ref 1.7–7.7)
NEUTROS PCT: 65 %
NRBC: 0 % (ref 0.0–0.2)
PLATELETS: 42 10*3/uL — AB (ref 150–400)
RBC: 3.73 MIL/uL — AB (ref 3.87–5.11)
RDW: 22.5 % — ABNORMAL HIGH (ref 11.5–15.5)
WBC: 3.5 10*3/uL — AB (ref 4.0–10.5)

## 2018-08-17 MED ORDER — METRONIDAZOLE 500 MG PO TABS: 500.0000 mg | ORAL_TABLET | Freq: Three times a day (TID) | ORAL | 0 refills | Status: DC

## 2018-08-17 NOTE — Patient Instructions (Signed)
Cyanocobalamin, Vitamin B12 injection What is this medicine? CYANOCOBALAMIN (sye an oh koe BAL a min) is a man made form of vitamin B12. Vitamin B12 is used in the growth of healthy blood cells, nerve cells, and proteins in the body. It also helps with the metabolism of fats and carbohydrates. This medicine is used to treat people who can not absorb vitamin B12. This medicine may be used for other purposes; ask your health care provider or pharmacist if you have questions. COMMON BRAND NAME(S): B-12 Compliance Kit, B-12 Injection Kit, Cyomin, LA-12, Nutri-Twelve, Physicians EZ Use B-12, Primabalt What should I tell my health care provider before I take this medicine? They need to know if you have any of these conditions: -kidney disease -Leber's disease -megaloblastic anemia -an unusual or allergic reaction to cyanocobalamin, cobalt, other medicines, foods, dyes, or preservatives -pregnant or trying to get pregnant -breast-feeding How should I use this medicine? This medicine is injected into a muscle or deeply under the skin. It is usually given by a health care professional in a clinic or doctor's office. However, your doctor may teach you how to inject yourself. Follow all instructions. Talk to your pediatrician regarding the use of this medicine in children. Special care may be needed. Overdosage: If you think you have taken too much of this medicine contact a poison control center or emergency room at once. NOTE: This medicine is only for you. Do not share this medicine with others. What if I miss a dose? If you are given your dose at a clinic or doctor's office, call to reschedule your appointment. If you give your own injections and you miss a dose, take it as soon as you can. If it is almost time for your next dose, take only that dose. Do not take double or extra doses. What may interact with this medicine? -colchicine -heavy alcohol intake This list may not describe all possible  interactions. Give your health care provider a list of all the medicines, herbs, non-prescription drugs, or dietary supplements you use. Also tell them if you smoke, drink alcohol, or use illegal drugs. Some items may interact with your medicine. What should I watch for while using this medicine? Visit your doctor or health care professional regularly. You may need blood work done while you are taking this medicine. You may need to follow a special diet. Talk to your doctor. Limit your alcohol intake and avoid smoking to get the best benefit. What side effects may I notice from receiving this medicine? Side effects that you should report to your doctor or health care professional as soon as possible: -allergic reactions like skin rash, itching or hives, swelling of the face, lips, or tongue -blue tint to skin -chest tightness, pain -difficulty breathing, wheezing -dizziness -red, swollen painful area on the leg Side effects that usually do not require medical attention (report to your doctor or health care professional if they continue or are bothersome): -diarrhea -headache This list may not describe all possible side effects. Call your doctor for medical advice about side effects. You may report side effects to FDA at 1-800-FDA-1088. Where should I keep my medicine? Keep out of the reach of children. Store at room temperature between 15 and 30 degrees C (59 and 85 degrees F). Protect from light. Throw away any unused medicine after the expiration date. NOTE: This sheet is a summary. It may not cover all possible information. If you have questions about this medicine, talk to your doctor, pharmacist, or   health care provider.  2018 Elsevier/Gold Standard (2008-01-14 22:10:20)  

## 2018-08-17 NOTE — Patient Instructions (Signed)
We discussed the results of your laboratory studies from October 30 and from today.  They are slowly improving.  In addition to nutritional deficiency with vitamin A06 and folic acid, your iron level is low.  Because your diarrhea persists, start Imodium AD: 1 caplet after each loose stool (maximum of 4 Imodium caplets daily).  Call us if ineffective.  Begin metronidazole: 500 mg 3 times daily for 10 days.  A prescription has been called into [facility].  This is an antibiotic for your bowels.  An appointment has been scheduled on Thursday, November 7.  Lab work, vitamin B12 injection, and iron infusion will be administered prior to an office visit.  Barring any unforeseen circumstances, your next visit following infusional iron and vitamin B12 is on November 7 to discuss the results of your lab work.  It is recommended that you continue folic acid: 1 mg twice daily.  Please do not hesitate to call should any questions arise in the interim.  Thank you! Ladona Ridgel, MD Hematology/Oncology

## 2018-08-18 ENCOUNTER — Encounter: Payer: Self-pay | Admitting: Hematology and Oncology

## 2018-08-18 NOTE — Progress Notes (Signed)
Hematology/Oncology Outpatient Progress Note  Patient Name:  Barbara Stanley  DOB:1971-08-13   Date of Service: August 17, 2018  Referring Provider: Lanae Boast, [place], [place] [address]   Consulting Physician: Henreitta Leber, MD Hematology/Oncology  Subjective: "I have low blood counts due to vitamin P79 and folic acid deficiency, and am here for the results of my laboratory studies and vitamin B12 injection to follow."  Objective: Past Medical History:  Diagnosis Date  . Anemia   . COPD (chronic obstructive pulmonary disease) ([place])   . Depression   . Emphysema   . GERD (gastroesophageal reflux disease)   . Tobacco abuse    Review of Systems: Constitutional: No fever, sweats, or shaking chills.  Poor appetite without significant weight loss.  Skin: No rash, scaling, sores, lumps, or jaundice. HEENT: No visual changes; unilateral right hearing deficit. Pulmonary: No unusual cough, sore throat, or orthopnea; active tobacco dependence. COPD. Breasts: No complaints; she is scheduled for mammogram. Cardiovascular: No coronary artery disease, angina, or myocardial infarction.  No cardiac dysrhythmia, essential hypertension, or dyslipidemia. Gastrointestinal: No indigestion, dysphagia, abdominal pain, or constipation.  Chronic diarrhea loose/semi-formed/watery, numbering 10-20 daily with mild abdominal cramps; no tenesmus; now worsening without melena or bright red blood per rectum.  GERD.  Daily nausea without vomiting.  She denies melena or bright red blood per rectum. GI evaluation scheduled. Genitourinary: No urinary frequency, urgency, hematuria, or dysuria. Musculoskeletal: DJD: Left shoulder, lower back, and knees bilaterally. No new arthralgias or myalgias; no joint swelling, pain, or instability. Hematologic: No bleeding tendency or easy bruisability. Endocrine: No intolerance to heat or cold; no thyroid disease or diabetes  mellitus. Vascular: No peripheral arterial or venous thromboembolic disease. Psychological: Major depression, or mood changes; no mental health illnesses. Neurological: No dizziness, lightheadedness, syncope, or near syncopal episodes; no numbness or tingling in the fingers or toes.  Physical Examination: Vital Signs: Body surface area is 2.18 meters squared. Body mass index is 33.31 kg/m. Vitals:   08/17/18 1404  BP: (!) 119/57  Pulse: 61  Resp: 18  Temp: 98.2 F (36.8 C)  SpO2: 99%    Filed Weights   08/17/18 1404  Weight: 219 lb 1.6 oz (99.4 kg)  ECOG PERFORMANCE STATUS: 1-2 Constitutional:  [name] is fully nourished and developed albeit overweight.  She looks chronically ill and older than her stated age.  She is friendly and cooperative without respiratory compromise at rest. Skin: No rashes, scaling, dryness, jaundice, or itching; pallor. HEENT: Head is normocephalic and atraumatic.  Pupils are equal round and reactive to light and accommodation.  Sclerae are anicteric. Conjunctivae are pale. No sinus tenderness nor oropharyngeal lesions.  Lips without cracking or peeling; tongue without mass, inflammation, or nodularity.  Mucous membranes are moist. Neck: Supple and symmetric.  No jugular venous distention or thyromegaly.  Trachea is midline. Lymphatics: No cervical or supraclavicular lymphadenopathy.  No epitrochlear, axillary, or inguinal lymphadenopathy is appreciated. Respiratory/chest: Thorax is symmetrical.  Breath sounds are clear to auscultation and percussion.  Normal excursion and respiratory effort. Back: Symmetric without deformity or tenderness. Cardiovascular: Heart rate and rhythm are regular without murmurs. Gastrointestinal: Abdomen is soft, nontender; no organomegaly.  Bowel sounds are normoactive.  No masses are appreciated. Extremities: In the lower extremities, there is no asymmetric swelling, erythema, tenderness, or cord formation. No clubbing or  cyanosis.  Her ankles are thick with trace pitting edema. Hematologic: No petechiae, hematomas, or ecchymoses. Psychological:  She is oriented to  person, place, and time; normal affect; memory and cognitive deficit.  Neurological: Facial twitching and tongue rolling consistent with tardive dyskinesia. There are no gross sensory or motor deficits.   Past Medical History Reviewed        Family History Reviewed        Social History Reviewed  Allergies  Allergen Reactions  . Other     "Banana Bag" given to patient at physician's office  She has no food allergies. She has nonspecific seasonal allergies.  Current Outpatient Medications on File Prior to Visit  Medication Sig  . folic acid (FOLVITE) 1 MG tablet Take 1 tablet (1 mg total) by mouth daily.  [name] acetaminophen (TYLENOL) 500 MG tablet Take 1,000 mg by mouth daily as needed for moderate pain.  [name] albuterol (PROAIR HFA) 108 (90 Base) MCG/ACT inhaler Inhale 2 puffs into the lungs every 6 (six) hours as needed for wheezing or shortness of breath.  . budesonide-formoterol (SYMBICORT) 160-4.5 MCG/ACT inhaler Inhale 2 puffs into the lungs 2 (two) times daily.  . cyanocobalamin (,VITAMIN B-12,) 1000 MCG/ML injection Inject 1 mL (1,000 mcg total) into the muscle once a week for 4 doses.  [name] esomeprazole (NEXIUM) 40 MG capsule TAKE 1 CAPSULE BY MOUTH EVERY DAY  . loperamide (IMODIUM) 2 MG capsule Take 1 capsule (2 mg total) by mouth 4 (four) times daily as needed for diarrhea or loose stools. (Patient not taking: Reported on 08/15/2018)  . tiotropium (SPIRIVA) 18 MCG inhalation capsule Place 1 capsule (18 mcg total) daily into inhaler and inhale.  . traZODone (DESYREL) 150 MG tablet Take 1 tablet (150 mg total) by mouth at bedtime. For sleep  . venlafaxine XR (EFFEXOR-XR) 75 MG 24 hr capsule Take 1 capsule (75 mg total) by mouth daily with breakfast. For depression   No current facility-administered medications on file prior to visit.     Laboratory Studies: August 17, 2018  Ref Range & Units 1d ago 3d ago 6d ago 7d ago  WBC Count 4.0 - 10.5 K/uL 3.5Low   3.4Low   1.7Low   1.6Low    RBC 3.87 - 5.11 MIL/uL 3.73Low   3.70Low   3.67Low   3.38Low    Hemoglobin 12.0 - 15.0 g/dL 10.6Low   10.3Low   10.1Low   9.4Low    HCT 36.0 - 46.0 % 35.4Low   34.5Low   34.9Low   31.7Low    MCV 80.0 - 100.0 fL 94.9  93.2  95.1  93.8   MCH 26.0 - 34.0 pg 28.4  27.8  27.5  27.8   MCHC 30.0 - 36.0 g/dL 29.9Low   29.9Low   28.9Low   29.7Low    RDW 11.5 - 15.5 % 22.5High   22.5High   22.3High   21.9High    Platelet Count 150 - 400 K/uL 42Low   44Low  CM 21Low Panic  CM 18Low Panic  CM  Comment: Immature Platelet Fraction may be  clinically indicated, consider  ordering this additional test  [id number]   nRBC 0.0 - 0.2 % 0.0  0.0  0.0  0.0   Neutrophils Relative % % 65  67  54  47   Neutro Abs 1.7 - 7.7 K/uL 2.3  2.4  0.9Low   0.8Low    Lymphocytes Relative % 22  20  34  42   Lymphs Abs 0.7 - 4.0 K/uL 0.8  0.7  0.6Low   0.7   Monocytes Relative % 10  10  9  8   Monocytes Absolute 0.1 - 1.0 K/uL 0.3  0.3  0.2  0.1   Eosinophils Relative % '1  1  1  1   ' Eosinophils Absolute 0.0 - 0.5 K/uL 0.0  0.0  0.0  0.0   Basophils Relative % '1  1  1  1   ' Basophils Absolute 0.0 - 0.1 K/uL 0.0  0.0  0.0  0.0   Immature Granulocytes % '1  1  1  1   ' Abs Immature Granulocytes 0.00 - 0.07 K/uL 0.02  0.02 CM 0.01  0.01    August 15, 2018  Ref Range & Units 1d ago 3d ago 6d ago 7d ago  WBC Count 4.0 - 10.5 K/uL 3.5Low   3.4Low   1.7Low   1.6Low    RBC 3.87 - 5.11 MIL/uL 3.73Low   3.70Low   3.67Low   3.38Low    Hemoglobin 12.0 - 15.0 g/dL 10.6Low   10.3Low   10.1Low   9.4Low    HCT 36.0 - 46.0 % 35.4Low   34.5Low   34.9Low   31.7Low    MCV 80.0 - 100.0 fL 94.9  93.2  95.1  93.8   MCH 26.0 - 34.0 pg 28.4  27.8  27.5  27.8   MCHC 30.0 - 36.0 g/dL 29.9Low   29.9Low   28.9Low   29.7Low    RDW 11.5 - 15.5 % 22.5High   22.5High   22.3High   21.9High    Platelet  Count 150 - 400 K/uL 42Low   44Low  CM 21Low Panic  CM 18Low Panic  CM  Comment: Immature Platelet Fraction may be  clinically indicated, consider  ordering this additional test  [id number]   nRBC 0.0 - 0.2 % 0.0  0.0  0.0  0.0   Neutrophils Relative % % 65  67  54  47   Neutro Abs 1.7 - 7.7 K/uL 2.3  2.4  0.9Low   0.8Low    Lymphocytes Relative % 22  20  34  42   Lymphs Abs 0.7 - 4.0 K/uL 0.8  0.7  0.6Low   0.7   Monocytes Relative % '10  10  9  8   ' Monocytes Absolute 0.1 - 1.0 K/uL 0.3  0.3  0.2  0.1   Eosinophils Relative % '1  1  1  1   ' Eosinophils Absolute 0.0 - 0.5 K/uL 0.0  0.0  0.0  0.0   Basophils Relative % '1  1  1  1   ' Basophils Absolute 0.0 - 0.1 K/uL 0.0  0.0  0.0  0.0   Immature Granulocytes % '1  1  1  1   ' Abs Immature Granulocytes 0.00 - 0.07 K/uL 0.02  0.02 CM 0.01  0.01     Ref Range & Units 3d ago 6d ago 7d ago 8d ago 9d ago  Sodium 135 - 145 mmol/L 143  146High   141  146High   144   Potassium 3.5 - 5.1 mmol/L 3.7  4.0  3.6  4.0 CM 2.8Low    Chloride 98 - 111 mmol/L 108  110  110  110  109   CO2 22 - 32 mmol/L '30  30  29  28  29   ' Glucose, Bld 70 - 99 mg/dL 76  85  84  87  82   BUN 6 - 20 mg/dL '8  8  9  8  9   ' Creatinine, Ser 0.44 - 1.00 mg/dL  0.64  0.59  0.67  0.59  0.52   Calcium 8.9 - 10.3 mg/dL 8.0Low   8.2Low   7.7Low   8.0Low   7.2Low    Total Protein 6.5 - 8.1 g/dL 5.0Low        Albumin 3.5 - 5.0 g/dL 2.8Low        AST 15 - 41 U/L 20       ALT 0 - 44 U/L 26       Alkaline Phosphatase 38 - 126 U/L 88       Total Bilirubin 0.3 - 1.2 mg/dL 0.9       GFR calc non Af Amer >60 mL/min >60  >60  >60  >60  >60   GFR calc Af Amer >60 mL/min >60  >60 CM >60 CM >60 CM >60 CM  Comment: (NOTE)         Ref Range & Units 14:14  Retic Ct Pct 0.4 - 3.1 % 2.6   RBC. 3.87 - 5.11 MIL/uL 3.70Low    Retic Count, Absolute 19.0 - 186.0 K/uL 96.6   Immature Retic Fract 2.3 - 15.9 % 23.4High    Reticulocyte Hemoglobin >27.9 pg 35.2   Comment:     Given the high negative  predictive  value of a RET-He result > 32 pg  iron deficiency is essentially  excluded. If this patient is anemic  other etiologies should be  considered.   Uric acid 4.8 LDH 450 Folic acid 4.0 Vitamin B12 911  Haptoglobin 72  Ferritin 25  Iron/TIBC 54/383  Iron saturation 14%  Diagnostic Imaging: August 09, 2018 ABDOMEN ULTRASOUND COMPLETE  COMPARISON: None.  FINDINGS: Gallbladder: Previous cholecystectomy.  Common bile duct: Diameter: 6.6 mm  Liver: No focal lesion identified. Within normal limits in parenchymal echogenicity. Portal vein is patent on color Doppler imaging with normal direction of blood flow towards the liver.  IVC: No abnormality visualized.  Pancreas: Visualized portion unremarkable.  Spleen: Spleen upper limits of normal in size measuring 12.6 cm in length with a volume of 397 cc.  Right Kidney: Length: 12.3 cm. Echogenicity within normal limits. No mass or hydronephrosis visualized.  Left Kidney: Length: 12.3 cm. Echogenicity within normal limits. No mass or hydronephrosis visualized.  Abdominal aorta: No aneurysm visualized.  Other findings: None.  IMPRESSION: 1. The common bile duct is upper limits of normal in caliber at 6.6 mm. Status post cholecystectomy. If there is a clinical concern for choledocholithiasis MRCP may be helpful for further assessment. 2. Spleen upper limits of normal in size.  Kerby Moors M.D. 08/09/2018 12:28  Assessment/Summary: In the setting of pancytopenia, discharged from [facility] on October 27; started on parenteral vitamin T88 and folic acid for presumptive severe pancytopenia secondary to undernutrition and vitamin deficiencies;  previously noncompliant with medication, she presents now for the results of her laboratory studies after starting parenteral vitamin B12 by injection in the [place] over the past 2 days, and after starting folic acid just 2 days  ago.  On August 08, 2018 she presented to the emergency department at [facility] after being sent by her primary care provider due to hypokalemia and weakness.  Her serum potassium was 2.9.  She complained also of diarrhea extending over 3-week period.  She had been to the emergency department on October 17 where she was given intravenous and oral potassium.  When her labs on the outside were found to be abnormal, she was sent to the  emergency department for further evaluation.  She just recently completed a course of moxifloxacin for possible pneumonia. She denied any recent travel outside the country. She has never had Clostritium difficile colitis. She has no history of inflammatory bowel disease, viral hepatitis, or diverticulosis. She is not a vegetarian.  Throughout her hospital stay, she was given intravenous fluid resuscitation and electrolyte replacement. Laboratory studies performed suggested severe pancytopenia and vitamin Z22 and folic acid deficiency. She was given vitamin B12: 1000 mcg IM daily for 3 days. Folic acid: 1 mg once daily was supplemented. When she steadily improved, she was discharged on October 27.  A prescription for esomeprazole: 40 mg once daily was written.  An appointment with [facility], was scheduled for November 20. She has never had a screening colonoscopy.  Although home health was to arrange vitamin B12 injections at home, her insurance would not provide coverage. Due to financial constrictions, they have been unable to obtain folic acid until 2 days ago. She was started on folic acid: 1 mg twice daily.  She has had gastroesophageal reflux disease over a lengthy but indefinite period.  She states that she has had bowel problems since her teenage years. She normally moves her bowels 5-6 times daily but extremely loose. Over the past several weeks however she has had bowel movements every hour (20 BMs daily) during this acute period.  While hospitalized she was given loperamide. Since her discharge, she has not taken any loperamide. Upon discharge, she moved her bowels 10 times daily described as semi-formed almost watery.  Since her last visit, she now has 6 stools daily which are semi-formed.  In the distant past, she was on iron supplementation.  She vaguely remembers that vitamin Q82 and folic acid were recommended  following her gastric bypass.  She reports no melena or bright red blood per rectum.  At the time of her initial visit on October 10, arrangements were made to have vitamin B12: 1000 mcg IM given daily in the [place] for 3 consecutive days.  She previously received 3 days of vitamin B12 while hospitalized. It was recommended that they obtain folic acid: 1 mg once daily by prescription.  If unable to obtain it financially, they were advised to obtain folic acid: 500 mcg by mouth: 3 tablets daily in divided doses from either a health food store or [employer]. A consultation with social service has been requested. Those arrangements are underway.   A stool specimen was requested for repeat C. difficile cytotoxin molecular, enteropathic pathogens, fecal leukocytes, and Helicobacter pylori.  They were instructed on collection and delivery of the specimen.  A specimen was delivered on October 31.  The results of the C. difficile quick screen with PCR reflex was negative.  Qualitative fecal lactoferrin was positive.  A stool culture for E. coli; Shigella toxin; Salmonella and Shigella were not identified. Campylobacter culture is pending.  Since her hospital discharge and starting daily parenteral vitamin B12 injections, she states her energy level is unchanged and extremely low. Her appetite is diminished while her weight remains stable. She has no unusual headaches, syncope, near syncopal episodes. She does have positional dizziness. There is no rash or itching. She has no cough, sore throat, orthopnea.  She denies  dyspnea either at rest or on minimal exertion. She denies pain or difficulty in swallowing.  No fever, shaking chills, sweats, or flulike symptoms are reported.  She has dyspepsia which has improved significantly since starting esomeprazole. She denies constipation. She  has almost daily nausea without vomiting.  No urinary frequency, urgency, hematuria, dysuria are reported.  She has no swelling of her ankles. There are no new arthralgias or myalgias.  She denies bleeding tendency or easy bruisability. She has no peripheral arterial venous thromboembolic disease.  There is intermittent tingling of her left hand and feet bilaterally.   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  Recommendation/Plan: The results of her laboratory studies from today suggest a very slow improvement with parenteral vitamin F64 and folic acid.  Folic acid was only started on October 30.  In light of her 6 stools daily and C. difficile negative on 2 separate occasions, it was recommended that she begin low-dose loperamide: 1 caplet after each loose stool (maximum of 4 caplets daily).  It was recommended that she use the brand name Imodium AD. She has an appointment with Dr. Thornton Park, [facility] on November 20.  It was recommended that she increase folic acid: 1 mg twice daily.  A prescription for metronidazole: 500 mg 3 times daily for 10 days was prescribed pending C. difficile by PCR assay.  She is scheduled and will receive vitamin B12: 1000 mcg IM following today's visit.  This is her third consecutive day of B12, following 3 consecutive days while hospitalized.  Because her iron studies suggest iron deficiency,  even though she was given infusional iron while hospitalized, arrangements are underway to give her replacement parenteral iron at the time of her next visit on November 7.  Barring any unforeseen complications, her next scheduled doctor visit, following infusional iron and vitamin B12 is on November 7 to discuss those results with recommendations.  She was advised to call us in the interim should any new or untoward problems arise.  The total time spent discussing the results of her laboratory studies, importance of compliance with folic acid replacement, stool specimen results, rationale on the use of loperamide, and continued parenteral iron infusion with expectation and potential side effects was 20 minutes. At least 50% of that time was spent in face-to-face discussion, counseling, and answering questions.  This note was dictated using voice activated technology/software.  Unfortunately, typographical errors are not uncommon, and transcription is subject to mistakes and regrettably misinterpretation.  If necessary, clarification of the above information can be discussed with me at any time.  FOLLOW UP: AS DIRECTED   cc:      Lanae Boast, FNP            Thornton Park MD   Henreitta Leber, MD  Hematology/Oncology [facility] [address] [place], [place] [address] Office: [phone] KZSW: [phone]

## 2018-08-19 LAB — H. PYLORI ANTIGEN, STOOL: H. PYLORI STOOL AG, EIA: NEGATIVE

## 2018-08-20 LAB — STOOL CULTURE: E coli, Shiga toxin Assay: NEGATIVE

## 2018-08-20 LAB — STOOL CULTURE REFLEX - CMPCXR

## 2018-08-20 LAB — STOOL CULTURE REFLEX - RSASHR

## 2018-08-23 ENCOUNTER — Telehealth: Payer: Self-pay

## 2018-08-23 ENCOUNTER — Encounter: Payer: Self-pay | Admitting: Hematology and Oncology

## 2018-08-23 ENCOUNTER — Other Ambulatory Visit: Payer: Self-pay

## 2018-08-23 ENCOUNTER — Inpatient Hospital Stay: Payer: Medicaid Other

## 2018-08-23 ENCOUNTER — Ambulatory Visit: Payer: Self-pay

## 2018-08-23 ENCOUNTER — Inpatient Hospital Stay (HOSPITAL_BASED_OUTPATIENT_CLINIC_OR_DEPARTMENT_OTHER): Payer: Medicaid Other | Admitting: Hematology and Oncology

## 2018-08-23 ENCOUNTER — Other Ambulatory Visit: Payer: Self-pay | Admitting: Hematology and Oncology

## 2018-08-23 VITALS — BP 118/66 | HR 68 | Temp 98.5°F | Resp 18 | Ht 68.0 in | Wt 210.0 lb

## 2018-08-23 VITALS — BP 114/71 | HR 66 | Temp 98.9°F | Resp 14

## 2018-08-23 DIAGNOSIS — R197 Diarrhea, unspecified: Secondary | ICD-10-CM | POA: Diagnosis not present

## 2018-08-23 DIAGNOSIS — E538 Deficiency of other specified B group vitamins: Secondary | ICD-10-CM

## 2018-08-23 DIAGNOSIS — D509 Iron deficiency anemia, unspecified: Secondary | ICD-10-CM | POA: Diagnosis not present

## 2018-08-23 DIAGNOSIS — E639 Nutritional deficiency, unspecified: Secondary | ICD-10-CM

## 2018-08-23 DIAGNOSIS — D696 Thrombocytopenia, unspecified: Secondary | ICD-10-CM

## 2018-08-23 DIAGNOSIS — D61818 Other pancytopenia: Secondary | ICD-10-CM

## 2018-08-23 LAB — COMPREHENSIVE METABOLIC PANEL
ALBUMIN: 3.1 g/dL — AB (ref 3.5–5.0)
ALK PHOS: 84 U/L (ref 38–126)
ALT: 23 U/L (ref 0–44)
AST: 25 U/L (ref 15–41)
Anion gap: 6 (ref 5–15)
BUN: 7 mg/dL (ref 6–20)
CALCIUM: 8.5 mg/dL — AB (ref 8.9–10.3)
CO2: 27 mmol/L (ref 22–32)
CREATININE: 0.66 mg/dL (ref 0.44–1.00)
Chloride: 109 mmol/L (ref 98–111)
GFR calc Af Amer: >60 mL/min (ref >60)
GFR calc non Af Amer: >60 mL/min (ref >60)
GLUCOSE: 81 mg/dL (ref 70–99)
Potassium: 3.8 mmol/L (ref 3.5–5.1)
Sodium: 142 mmol/L (ref 135–145)
Total Bilirubin: 1 mg/dL (ref 0.3–1.2)
Total Protein: 5.3 g/dL — ABNORMAL LOW (ref 6.5–8.1)

## 2018-08-23 LAB — CBC WITH DIFFERENTIAL (CANCER CENTER ONLY)
Abs Immature Granulocytes: 0.01 10*3/uL (ref 0.00–0.07)
Basophils Absolute: 0 10*3/uL (ref 0.0–0.1)
Basophils Relative: 1 %
EOS PCT: 0 %
Eosinophils Absolute: 0 10*3/uL (ref 0.0–0.5)
HEMATOCRIT: 37.5 % (ref 36.0–46.0)
HEMOGLOBIN: 11.3 g/dL — AB (ref 12.0–15.0)
Immature Granulocytes: 0 %
LYMPHS ABS: 0.6 10*3/uL — AB (ref 0.7–4.0)
Lymphocytes Relative: 27 %
MCH: 28.4 pg (ref 26.0–34.0)
MCHC: 30.1 g/dL (ref 30.0–36.0)
MCV: 94.2 fL (ref 80.0–100.0)
MONOS PCT: 7 %
Monocytes Absolute: 0.2 10*3/uL (ref 0.1–1.0)
Neutro Abs: 1.5 10*3/uL — ABNORMAL LOW (ref 1.7–7.7)
Neutrophils Relative %: 65 %
Platelet Count: 25 10*3/uL — ABNORMAL LOW (ref 150–400)
RBC: 3.98 MIL/uL (ref 3.87–5.11)
RDW: 21.3 % — ABNORMAL HIGH (ref 11.5–15.5)
WBC Count: 2.3 10*3/uL — ABNORMAL LOW (ref 4.0–10.5)
nRBC: 0 % (ref 0.0–0.2)

## 2018-08-23 MED ORDER — SODIUM CHLORIDE 0.9 % IV SOLN
510.0000 mg | Freq: Once | INTRAVENOUS | Status: AC
  Administered 2018-08-23: 510 mg via INTRAVENOUS
  Filled 2018-08-23: qty 17

## 2018-08-23 MED ORDER — CYANOCOBALAMIN 1000 MCG/ML IJ SOLN
INTRAMUSCULAR | Status: AC
  Filled 2018-08-23: qty 1

## 2018-08-23 MED ORDER — CYANOCOBALAMIN 1000 MCG/ML IJ SOLN
1000.0000 ug | Freq: Once | INTRAMUSCULAR | Status: AC
  Administered 2018-08-23: 1000 ug via INTRAMUSCULAR

## 2018-08-23 NOTE — Telephone Encounter (Signed)
Per 11/7 los printed avs and calender of upcoming appointment.

## 2018-08-23 NOTE — Patient Instructions (Signed)
We reviewed the results of your laboratory studies from today.  Continue folic acid: 1 mg twice daily  Discontinue metronidazole (Flagyl).  A follow-up visit with laboratory studies and vitamin B12 is now scheduled for November 15.  Barring any unforeseen complications, your next scheduled doctor visit with labs and vitamin B12 after November 15 is on November 21.  Please do not hesitate to call should any new or untoward problems arise.

## 2018-08-23 NOTE — Patient Instructions (Signed)
Cyanocobalamin, Vitamin B12 injection What is this medicine? CYANOCOBALAMIN (sye an oh koe BAL a min) is a man made form of vitamin B12. Vitamin B12 is used in the growth of healthy blood cells, nerve cells, and proteins in the body. It also helps with the metabolism of fats and carbohydrates. This medicine is used to treat people who can not absorb vitamin B12. This medicine may be used for other purposes; ask your health care provider or pharmacist if you have questions. COMMON BRAND NAME(S): B-12 Compliance Kit, B-12 Injection Kit, Cyomin, LA-12, Nutri-Twelve, Physicians EZ Use B-12, Primabalt What should I tell my health care provider before I take this medicine? They need to know if you have any of these conditions: -kidney disease -Leber's disease -megaloblastic anemia -an unusual or allergic reaction to cyanocobalamin, cobalt, other medicines, foods, dyes, or preservatives -pregnant or trying to get pregnant -breast-feeding How should I use this medicine? This medicine is injected into a muscle or deeply under the skin. It is usually given by a health care professional in a clinic or doctor's office. However, your doctor may teach you how to inject yourself. Follow all instructions. Talk to your pediatrician regarding the use of this medicine in children. Special care may be needed. Overdosage: If you think you have taken too much of this medicine contact a poison control center or emergency room at once. NOTE: This medicine is only for you. Do not share this medicine with others. What if I miss a dose? If you are given your dose at a clinic or doctor's office, call to reschedule your appointment. If you give your own injections and you miss a dose, take it as soon as you can. If it is almost time for your next dose, take only that dose. Do not take double or extra doses. What may interact with this medicine? -colchicine -heavy alcohol intake This list may not describe all possible  interactions. Give your health care provider a list of all the medicines, herbs, non-prescription drugs, or dietary supplements you use. Also tell them if you smoke, drink alcohol, or use illegal drugs. Some items may interact with your medicine. What should I watch for while using this medicine? Visit your doctor or health care professional regularly. You may need blood work done while you are taking this medicine. You may need to follow a special diet. Talk to your doctor. Limit your alcohol intake and avoid smoking to get the best benefit. What side effects may I notice from receiving this medicine? Side effects that you should report to your doctor or health care professional as soon as possible: -allergic reactions like skin rash, itching or hives, swelling of the face, lips, or tongue -blue tint to skin -chest tightness, pain -difficulty breathing, wheezing -dizziness -red, swollen painful area on the leg Side effects that usually do not require medical attention (report to your doctor or health care professional if they continue or are bothersome): -diarrhea -headache This list may not describe all possible side effects. Call your doctor for medical advice about side effects. You may report side effects to FDA at 1-800-FDA-1088. Where should I keep my medicine? Keep out of the reach of children. Store at room temperature between 15 and 30 degrees C (59 and 85 degrees F). Protect from light. Throw away any unused medicine after the expiration date. NOTE: This sheet is a summary. It may not cover all possible information. If you have questions about this medicine, talk to your doctor, pharmacist, or   health care provider.  2018 Elsevier/Gold Standard (2008-01-14 22:10:20)  

## 2018-08-23 NOTE — Progress Notes (Signed)
Hematology/Oncology Outpatient Progress Note  Patient Name:  Barbara Stanley  DOB:1971-05-03   Date of Service: August 23, 2018  Referring Provider: Lanae Boast, [place], [place] [address]   Consulting Physician: Henreitta Leber, MD Hematology/Oncology  Subjective: "I have low blood counts due to vitamin I01 and folic acid deficiency, and am here for the results of my laboratory studies, vitamin B12 injection and infusional iron."  Objective: Past Medical History:  Diagnosis Date  . Anemia   . COPD (chronic obstructive pulmonary disease) ([place])   . Depression   . Emphysema   . GERD (gastroesophageal reflux disease)   . Tobacco abuse    Review of Systems: Constitutional: No fever, sweats, or shaking chills. Fair appetite without significant weight loss.  Skin: No rash, scaling, sores, lumps, or jaundice. HEENT: No visual changes; unilateral right hearing deficit. Pulmonary: No unusual cough, sore throat, or orthopnea; active tobacco dependence. COPD. Breasts: No complaints; she is scheduled for mammogram. Cardiovascular: No coronary artery disease, angina, or myocardial infarction.  No cardiac dysrhythmia, essential hypertension, or dyslipidemia. Gastrointestinal: No indigestion, dysphagia, abdominal pain, or constipation.  Chronic diarrhea loose/semi-formed/watery, numbering 3 daily without mild abdominal cramps; no tenesmus; now worsening without melena or bright red blood per rectum. GERD. Episodic nausea without vomiting.  She denies melena or bright red blood per rectum. GI evaluation scheduled. Genitourinary: No urinary frequency, urgency, hematuria, or dysuria. Musculoskeletal: DJD: Left shoulder, lower back, and knees bilaterally. No new arthralgias or myalgias; no joint swelling, pain, or instability. Hematologic: No bleeding tendency or easy bruisability. Endocrine: No intolerance to hot or cold; no thyroid disease or diabetes  mellitus. Vascular: No peripheral arterial or venous thromboembolic disease. Psychological: Major depression, or mood changes; no mental health illnesses. Neurological: Positional dizziness.  No syncope, or near syncopal episodes; no numbness or tingling in the fingers or toes.  Physical Examination: Vital Signs: Body surface area is 2.14 meters squared. Body mass index is 31.93 kg/m. Vitals:   08/23/18 1526  BP: 118/66  Pulse: 68  Resp: 18  Temp: 98.5 F (36.9 C)  SpO2: 96%    Filed Weights   08/23/18 1526  Weight: 210 lb (95.3 kg)  ECOG PERFORMANCE STATUS: 1 Constitutional:  Barbara Stanley is fully nourished and developed albeit overweight.  She looks older than her stated age.  She is friendly and cooperative without respiratory compromise at rest. Skin: No rashes, scaling, dryness, jaundice, or itching. HEENT: Head is normocephalic and atraumatic.  Pupils are equal round and reactive to light and accommodation.  Sclerae are anicteric. Conjunctivae are pale. No sinus tenderness nor oropharyngeal lesions.  Lips without cracking or peeling; tongue without mass, inflammation, or nodularity.  Mucous membranes are moist. Neck: Supple and symmetric.  No jugular venous distention or thyromegaly.  Trachea is midline. Lymphatics: No cervical or supraclavicular lymphadenopathy.  No epitrochlear, axillary, or inguinal lymphadenopathy is appreciated. Respiratory/chest: Thorax is symmetrical.  Breath sounds are clear to auscultation and percussion.  Normal excursion and respiratory effort. Back: Symmetric without deformity or tenderness. Cardiovascular: Heart rate and rhythm are regular without murmurs. Gastrointestinal: Abdomen is soft, nontender; no organomegaly.  Bowel sounds are normoactive.  No masses are appreciated. Extremities: In the lower extremities, there is no asymmetric swelling, erythema, tenderness, or cord formation. No clubbing or cyanosis.  Her ankles are thick with trace  pitting edema. Hematologic: No petechiae, hematomas, or ecchymoses. Psychological:  She is oriented to person, place, and time; normal affect; memory and cognitive  deficit.  Neurological:  Minimal facial twitching and tongue rolling tardive dyskinesia). There are no gross sensory or motor deficits.   Past Medical History Reviewed        Family History Reviewed        Social History Reviewed  Allergies  Allergen Reactions  . Other     "Banana Bag" given to patient at physician's office  She has no food allergies. She has nonspecific seasonal allergies.  Current Outpatient Medications on File Prior to Visit  Medication Sig  . acetaminophen (TYLENOL) 500 MG tablet Take 1,000 mg by mouth daily as needed for moderate pain.  [name] albuterol (PROAIR HFA) 108 (90 Base) MCG/ACT inhaler Inhale 2 puffs into the lungs every 6 (six) hours as needed for wheezing or shortness of breath.  . budesonide-formoterol (SYMBICORT) 160-4.5 MCG/ACT inhaler Inhale 2 puffs into the lungs 2 (two) times daily.  . cyanocobalamin (,VITAMIN B-12,) 1000 MCG/ML injection Inject 1 mL (1,000 mcg total) into the muscle once a week for 4 doses.  [name] esomeprazole (NEXIUM) 40 MG capsule TAKE 1 CAPSULE BY MOUTH EVERY DAY  . folic acid (FOLVITE) 1 MG tablet Take 1 tablet (1 mg total) by mouth daily.  [name] loperamide (IMODIUM) 2 MG capsule Take 1 capsule (2 mg total) by mouth 4 (four) times daily as needed for diarrhea or loose stools.  . tiotropium (SPIRIVA) 18 MCG inhalation capsule Place 1 capsule (18 mcg total) daily into inhaler and inhale.  . traZODone (DESYREL) 150 MG tablet Take 1 tablet (150 mg total) by mouth at bedtime. For sleep  . venlafaxine XR (EFFEXOR-XR) 75 MG 24 hr capsule Take 1 capsule (75 mg total) by mouth daily with breakfast. For depression  . metroNIDAZOLE (FLAGYL) 500 MG tablet Take 1 tablet (500 mg total) by mouth 3 (three) times daily. (Patient not taking: Reported on 08/23/2018)   No current  facility-administered medications on file prior to visit.    Laboratory Studies: August 23, 2018  Ref Range & Units 10:36 6d ago 8d ago 11d ago 12d ago  WBC Count 4.0 - 10.5 K/uL 2.3Low   3.5Low   3.4Low   1.7Low   1.6Low    RBC 3.87 - 5.11 MIL/uL 3.98  3.73Low   3.70Low   3.67Low   3.38Low    Hemoglobin 12.0 - 15.0 g/dL 11.3Low   10.6Low   10.3Low   10.1Low   9.4Low    HCT 36.0 - 46.0 % 37.5  35.4Low   34.5Low   34.9Low   31.7Low    MCV 80.0 - 100.0 fL 94.2  94.9  93.2  95.1  93.8   MCH 26.0 - 34.0 pg 28.4  28.4  27.8  27.5  27.8   MCHC 30.0 - 36.0 g/dL 30.1  29.9Low   29.9Low   28.9Low   29.7Low    RDW 11.5 - 15.5 % 21.3High   22.5High   22.5High   22.3High   21.9High    Platelet Count 150 - 400 K/uL 25Low   42Low  CM 44Low  CM 21Low Panic  CM 18Low Panic  CM  Comment: Immature Platelet Fraction may be     Ref Range & Units 10:36 8d ago 11d ago 12d ago 13d ago  Sodium 135 - 145 mmol/L 142  143  146High   141  146High    Potassium 3.5 - 5.1 mmol/L 3.8  3.7  4.0  3.6  4.0 CM  Chloride 98 - 111 mmol/L 109  108  110  110  110   CO2 22 - 32 mmol/L '27  30  30  29  28   ' Glucose, Bld 70 - 99 mg/dL 81  76  85  84  87   BUN 6 - 20 mg/dL '7  8  8  9  8   ' Creatinine, Ser 0.44 - 1.00 mg/dL 0.66  0.64  0.59  0.67  0.59   Calcium 8.9 - 10.3 mg/dL 8.5Low   8.0Low   8.2Low   7.7Low   8.0Low    Total Protein 6.5 - 8.1 g/dL 5.3Low   5.0Low       Albumin 3.5 - 5.0 g/dL 3.1Low   2.8Low       AST 15 - 41 U/L 25  20      ALT 0 - 44 U/L 23  26      Alkaline Phosphatase 38 - 126 U/L 84  88      Total Bilirubin 0.3 - 1.2 mg/dL 1.0  0.9      GFR calc non Af Amer >60 mL/min >60  >60  >60  >60  >60   GFR calc Af Amer >60 mL/min >60  >60 CM >60 CM >60 CM >60 CM   August 17, 2018  Ref Range & Units 1d ago 3d ago 6d ago 7d ago  WBC Count 4.0 - 10.5 K/uL 3.5Low   3.4Low   1.7Low   1.6Low    RBC 3.87 - 5.11 MIL/uL 3.73Low   3.70Low   3.67Low   3.38Low    Hemoglobin 12.0 - 15.0 g/dL 10.6Low   10.3Low   10.1Low    9.4Low    HCT 36.0 - 46.0 % 35.4Low   34.5Low   34.9Low   31.7Low    MCV 80.0 - 100.0 fL 94.9  93.2  95.1  93.8   MCH 26.0 - 34.0 pg 28.4  27.8  27.5  27.8   MCHC 30.0 - 36.0 g/dL 29.9Low   29.9Low   28.9Low   29.7Low    RDW 11.5 - 15.5 % 22.5High   22.5High   22.3High   21.9High    Platelet Count 150 - 400 K/uL 42Low   44Low  CM 21Low Panic  CM 18Low Panic  CM  Comment: Immature Platelet Fraction may be  clinically indicated, consider  ordering this additional test  [id number]   nRBC 0.0 - 0.2 % 0.0  0.0  0.0  0.0   Neutrophils Relative % % 65  67  54  47   Neutro Abs 1.7 - 7.7 K/uL 2.3  2.4  0.9Low   0.8Low    Lymphocytes Relative % 22  20  34  42   Lymphs Abs 0.7 - 4.0 K/uL 0.8  0.7  0.6Low   0.7   Monocytes Relative % '10  10  9  8   ' Monocytes Absolute 0.1 - 1.0 K/uL 0.3  0.3  0.2  0.1   Eosinophils Relative % '1  1  1  1   ' Eosinophils Absolute 0.0 - 0.5 K/uL 0.0  0.0  0.0  0.0   Basophils Relative % '1  1  1  1   ' Basophils Absolute 0.0 - 0.1 K/uL 0.0  0.0  0.0  0.0   Immature Granulocytes % '1  1  1  1   ' Abs Immature Granulocytes 0.00 - 0.07 K/uL 0.02  0.02 CM 0.01  0.01    August 15, 2018  Ref Range & Units  1d ago 3d ago 6d ago 7d ago  WBC Count 4.0 - 10.5 K/uL 3.5Low   3.4Low   1.7Low   1.6Low    RBC 3.87 - 5.11 MIL/uL 3.73Low   3.70Low   3.67Low   3.38Low    Hemoglobin 12.0 - 15.0 g/dL 10.6Low   10.3Low   10.1Low   9.4Low    HCT 36.0 - 46.0 % 35.4Low   34.5Low   34.9Low   31.7Low    MCV 80.0 - 100.0 fL 94.9  93.2  95.1  93.8   MCH 26.0 - 34.0 pg 28.4  27.8  27.5  27.8   MCHC 30.0 - 36.0 g/dL 29.9Low   29.9Low   28.9Low   29.7Low    RDW 11.5 - 15.5 % 22.5High   22.5High   22.3High   21.9High    Platelet Count 150 - 400 K/uL 42Low   44Low  CM 21Low Panic  CM 18Low Panic  CM  Comment: Immature Platelet Fraction may be  clinically indicated, consider  ordering this additional test  [id number]   nRBC 0.0 - 0.2 % 0.0  0.0  0.0  0.0   Neutrophils Relative % % 65  67  54  47     Neutro Abs 1.7 - 7.7 K/uL 2.3  2.4  0.9Low   0.8Low    Lymphocytes Relative % 22  20  34  42   Lymphs Abs 0.7 - 4.0 K/uL 0.8  0.7  0.6Low   0.7   Monocytes Relative % '10  10  9  8   ' Monocytes Absolute 0.1 - 1.0 K/uL 0.3  0.3  0.2  0.1   Eosinophils Relative % '1  1  1  1   ' Eosinophils Absolute 0.0 - 0.5 K/uL 0.0  0.0  0.0  0.0   Basophils Relative % '1  1  1  1   ' Basophils Absolute 0.0 - 0.1 K/uL 0.0  0.0  0.0  0.0   Immature Granulocytes % '1  1  1  1   ' Abs Immature Granulocytes 0.00 - 0.07 K/uL 0.02  0.02 CM 0.01  0.01     Ref Range & Units 3d ago 6d ago 7d ago 8d ago 9d ago  Sodium 135 - 145 mmol/L 143  146High   141  146High   144   Potassium 3.5 - 5.1 mmol/L 3.7  4.0  3.6  4.0 CM 2.8Low    Chloride 98 - 111 mmol/L 108  110  110  110  109   CO2 22 - 32 mmol/L '30  30  29  28  29   ' Glucose, Bld 70 - 99 mg/dL 76  85  84  87  82   BUN 6 - 20 mg/dL '8  8  9  8  9   ' Creatinine, Ser 0.44 - 1.00 mg/dL 0.64  0.59  0.67  0.59  0.52   Calcium 8.9 - 10.3 mg/dL 8.0Low   8.2Low   7.7Low   8.0Low   7.2Low    Total Protein 6.5 - 8.1 g/dL 5.0Low        Albumin 3.5 - 5.0 g/dL 2.8Low        AST 15 - 41 U/L 20       ALT 0 - 44 U/L 26       Alkaline Phosphatase 38 - 126 U/L 88       Total Bilirubin 0.3 - 1.2 mg/dL 0.9  GFR calc non Af Amer >60 mL/min >60  >60  >60  >60  >60   GFR calc Af Amer >60 mL/min >60  >60 CM >60 CM >60 CM >60 CM  Comment: (NOTE)         Ref Range & Units 14:14  Retic Ct Pct 0.4 - 3.1 % 2.6   RBC. 3.87 - 5.11 MIL/uL 3.70Low    Retic Count, Absolute 19.0 - 186.0 K/uL 96.6   Immature Retic Fract 2.3 - 15.9 % 23.4High    Reticulocyte Hemoglobin >27.9 pg 35.2   Comment:     Given the high negative predictive  value of a RET-He result > 32 pg  iron deficiency is essentially  excluded. If this patient is anemic  other etiologies should be  considered.   Uric acid 4.8 LDH 867 Folic acid 4.0 Vitamin B12 911  Haptoglobin 72  Ferritin 25  Iron/TIBC 54/383  Iron  saturation 14%  Diagnostic Imaging: August 09, 2018 ABDOMEN ULTRASOUND COMPLETE  COMPARISON: None.  FINDINGS: Gallbladder: Previous cholecystectomy.  Common bile duct: Diameter: 6.6 mm  Liver: No focal lesion identified. Within normal limits in parenchymal echogenicity. Portal vein is patent on color Doppler imaging with normal direction of blood flow towards the liver.  IVC: No abnormality visualized.  Pancreas: Visualized portion unremarkable.  Spleen: Spleen upper limits of normal in size measuring 12.6 cm in length with a volume of 397 cc.  Right Kidney: Length: 12.3 cm. Echogenicity within normal limits. No mass or hydronephrosis visualized.  Left Kidney: Length: 12.3 cm. Echogenicity within normal limits. No mass or hydronephrosis visualized.  Abdominal aorta: No aneurysm visualized.  Other findings: None.  IMPRESSION: 1. The common bile duct is upper limits of normal in caliber at 6.6 mm. Status post cholecystectomy. If there is a clinical concern for choledocholithiasis MRCP may be helpful for further assessment. 2. Spleen upper limits of normal in size.  Kerby Moors M.D. 08/09/2018 12:28  Assessment/Summary: In the setting of pancytopenia, discharged from [facility] on October 27; started on parenteral vitamin J44 and folic acid for presumptive severe pancytopenia secondary to undernutrition and vitamin deficiencies;  previously noncompliant with medication, she presents now for the results of her laboratory studies after starting parenteral vitamin B12 by injection in the [place] over the past 2 days, and after starting folic acid just 2 days ago.  On August 08, 2018 she presented to the emergency department at [facility] after being sent by her primary care provider due to hypokalemia and weakness.  Her serum potassium was 2.9.  She complained also of diarrhea extending over 3-week period.   She had been to the emergency department on October 17 where she was given intravenous and oral potassium.  When her labs on the outside were found to be abnormal, she was sent to the emergency department for further evaluation. She just recently completed a course of moxifloxacin for possible pneumonia. She denied any recent travel outside the country. She has never had Clostritium difficile colitis. She has no history of inflammatory bowel disease, viral hepatitis, or diverticulosis. She is not a vegetarian.  Throughout her hospital stay, she was given intravenous fluid resuscitation and electrolyte replacement. Laboratory studies performed suggested severe pancytopenia and vitamin B20 and folic acid deficiency. She was given vitamin B12: 1000 mcg IM daily for 3 days. Folic acid: 1 mg once daily was supplemented. When she steadily improved, she was discharged on October 27. A prescription for esomeprazole:  40 mg once daily was written.  An appointment with [facility], was scheduled for November 20. She has never had a screening colonoscopy.  Although home health was to arrange vitamin B12 injections at home, her insurance would not provide coverage. Due to financial constrictions, they have been unable to obtain folic acid until 2 days ago. She was started on folic acid: 1 mg twice daily.  She has had gastroesophageal reflux disease over a lengthy but indefinite period.  She states that she has had bowel problems since her teenage years. She normally moves her bowels 5-6 times daily but extremely loose. Over the past several weeks however she has had bowel movements every hour (20 BMs daily) during this acute period. While hospitalized she was given loperamide. Upon discharge, she moved her bowels 10 times daily described as semi-formed almost watery.  Since her last visit, she now has 3 stools daily which are semi-formed. In the distant past, she was on iron supplementation.  She vaguely  remembers that vitamin H85 and folic acid were recommended  following her gastric bypass.  She reports no melena or bright red blood per rectum.  At the time of her initial visit on October 30, arrangements were made to have vitamin B12: 1000 mcg IM given daily in the [facility] for 3 consecutive days.  She previously received 3 days of vitamin B12 while hospitalized. It was recommended that they obtain folic acid: 1 mg once daily by prescription. A consultation with social service has been requested. Those arrangements are underway.   A stool specimen was requested for repeat C. difficile cytotoxin molecular, enteropathic pathogens, fecal leukocytes, and Helicobacter pylori.  They were instructed on collection and delivery of the specimen.  A specimen was delivered on October 31. The results of the C. difficile quick screen with PCR reflex was negative.  Qualitative fecal lactoferrin was positive.  A stool culture for E. coli; Shigella toxin; Salmonella and Shigella were not identified. Campylobacter culture is pending.  Because her serum ferritin was low, she is scheduled to receive ferumoxytol: 510 mg IVPB today along with week 1/4 of continued parenteral vitamin B12.  In addition she continues on folic acid: 1 mg twice daily.  She states her energy level is unchanged and low. Her appetite is diminished while her weight remains stable. She has no unusual headaches, syncope, near syncopal episodes. She does have positional dizziness. There is no rash or itching. She has no cough, sore throat, orthopnea. She denies dyspnea either at rest or on minimal exertion. She denies pain or difficulty in swallowing. No fever, shaking chills, sweats, or flulike symptoms are reported. She has dyspepsia which has improved significantly since starting esomeprazole. She denies constipation. She has almost daily nausea without vomiting.  No urinary frequency, urgency, hematuria, dysuria are reported.  She has no  swelling of her ankles. There are no new arthralgias or myalgias.  She denies bleeding tendency or easy bruisability. She has no peripheral arterial venous thromboembolic disease.  There is intermittent tingling of her left hand and feet bilaterally.   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  Recommendation/Plan: The results of her laboratory studies from today suggest a slow improvement with parenteral vitamin I77 and folic acid.  Those results are detailed above.  Folic acid was  only started on October 30.  In light of her previous 6 stools daily and C. difficile negative on 2 separate occasions, it was recommended that she begin low-dose loperamide: 1 caplet after each loose stool (maximum of 4 caplets daily).  She generally takes 2 caplets daily.  Her stools are now semi-formed but numbering 3 daily.  It was recommended that she use the brand name Imodium AD. She has an appointment with Dr. Thornton Park, [facility] on November 20.  It was recommended that she continue folic acid: 1 mg twice daily.  Because of mild nausea and anorexia this week without vomiting, and in the absence of positive stool cultures, metronidazole will be discontinued.  Barring any unforeseen complications, her next scheduled doctor visit with laboratory studies and parenteral vitamin B12 (week 2/4: Vitamin B12) is on November 15.  Her weekly treatment thereafter will be on September 06, 2018 (week 3/4: Vitamin B12).  She was advised to call us in the interim should any new or untoward problems arise.  The total time spent discussing the results of her laboratory studies, importance of compliance with folic acid  replacement, rationale on the use of loperamide, and discussion was 25 minutes. At least 50% of that time was spent in face-to-face discussion, counseling, and answering questions.  This note was dictated using voice activated technology/software.  Unfortunately, typographical errors are not uncommon, and transcription is subject to mistakes and regrettably misinterpretation.  If necessary, clarification of the above information can be discussed with me at any time.  FOLLOW UP: AS DIRECTED   cc:      Lanae Boast, FNP            Thornton Park MD   Henreitta Leber, MD  Hematology/Oncology [facility] [address]. [place], [place] [address] Office: [phone] ETKK: [phone]

## 2018-08-26 ENCOUNTER — Encounter: Payer: Self-pay | Admitting: Hematology and Oncology

## 2018-08-30 ENCOUNTER — Other Ambulatory Visit: Payer: Self-pay | Admitting: Medical Oncology

## 2018-08-30 DIAGNOSIS — E538 Deficiency of other specified B group vitamins: Secondary | ICD-10-CM

## 2018-08-30 MED ORDER — FOLIC ACID 1 MG PO TABS: 1.0000 mg | ORAL_TABLET | Freq: Every day | ORAL | 0 refills | Status: DC

## 2018-08-31 ENCOUNTER — Inpatient Hospital Stay: Payer: Medicaid Other

## 2018-08-31 ENCOUNTER — Other Ambulatory Visit: Payer: Self-pay | Admitting: Hematology and Oncology

## 2018-08-31 ENCOUNTER — Inpatient Hospital Stay (HOSPITAL_BASED_OUTPATIENT_CLINIC_OR_DEPARTMENT_OTHER): Payer: Medicaid Other | Admitting: Hematology and Oncology

## 2018-08-31 ENCOUNTER — Telehealth: Payer: Self-pay | Admitting: Hematology and Oncology

## 2018-08-31 ENCOUNTER — Encounter: Payer: Self-pay | Admitting: Hematology and Oncology

## 2018-08-31 VITALS — BP 123/78 | HR 66 | Temp 98.8°F | Resp 18 | Ht 68.0 in | Wt 201.5 lb

## 2018-08-31 DIAGNOSIS — D509 Iron deficiency anemia, unspecified: Secondary | ICD-10-CM | POA: Diagnosis not present

## 2018-08-31 DIAGNOSIS — E538 Deficiency of other specified B group vitamins: Secondary | ICD-10-CM

## 2018-08-31 DIAGNOSIS — R197 Diarrhea, unspecified: Secondary | ICD-10-CM

## 2018-08-31 DIAGNOSIS — E639 Nutritional deficiency, unspecified: Secondary | ICD-10-CM

## 2018-08-31 DIAGNOSIS — D696 Thrombocytopenia, unspecified: Secondary | ICD-10-CM

## 2018-08-31 LAB — CBC WITH DIFFERENTIAL (CANCER CENTER ONLY)
Abs Immature Granulocytes: 0.02 10*3/uL (ref 0.00–0.07)
BASOS ABS: 0 10*3/uL (ref 0.0–0.1)
BASOS PCT: 1 %
EOS PCT: 1 %
Eosinophils Absolute: 0 10*3/uL (ref 0.0–0.5)
HCT: 36.7 % (ref 36.0–46.0)
Hemoglobin: 11.6 g/dL — ABNORMAL LOW (ref 12.0–15.0)
IMMATURE GRANULOCYTES: 1 %
Lymphocytes Relative: 26 %
Lymphs Abs: 0.9 10*3/uL (ref 0.7–4.0)
MCH: 29.4 pg (ref 26.0–34.0)
MCHC: 31.6 g/dL (ref 30.0–36.0)
MCV: 92.9 fL (ref 80.0–100.0)
Monocytes Absolute: 0.2 10*3/uL (ref 0.1–1.0)
Monocytes Relative: 6 %
NEUTROS PCT: 65 %
NRBC: 0 % (ref 0.0–0.2)
Neutro Abs: 2.4 10*3/uL (ref 1.7–7.7)
PLATELETS: 18 10*3/uL — AB (ref 150–400)
RBC: 3.95 MIL/uL (ref 3.87–5.11)
RDW: 20 % — AB (ref 11.5–15.5)
WBC Count: 3.6 10*3/uL — ABNORMAL LOW (ref 4.0–10.5)

## 2018-08-31 LAB — COMPREHENSIVE METABOLIC PANEL
ALK PHOS: 89 U/L (ref 38–126)
ALT: 23 U/L (ref 0–44)
ANION GAP: 7 (ref 5–15)
AST: 22 U/L (ref 15–41)
Albumin: 3.3 g/dL — ABNORMAL LOW (ref 3.5–5.0)
BUN: 12 mg/dL (ref 6–20)
CALCIUM: 8.4 mg/dL — AB (ref 8.9–10.3)
CO2: 26 mmol/L (ref 22–32)
Chloride: 108 mmol/L (ref 98–111)
Creatinine, Ser: 0.66 mg/dL (ref 0.44–1.00)
Glucose, Bld: 92 mg/dL (ref 70–99)
POTASSIUM: 3.7 mmol/L (ref 3.5–5.1)
SODIUM: 141 mmol/L (ref 135–145)
Total Bilirubin: 0.7 mg/dL (ref 0.3–1.2)
Total Protein: 5.6 g/dL — ABNORMAL LOW (ref 6.5–8.1)

## 2018-08-31 LAB — FOLATE: FOLATE: 14.1 ng/mL (ref >5.9)

## 2018-08-31 LAB — FERRITIN: FERRITIN: 89 ng/mL (ref 11–307)

## 2018-08-31 LAB — PHOSPHORUS: Phosphorus: 3.7 mg/dL (ref 2.5–4.6)

## 2018-08-31 LAB — MAGNESIUM: MAGNESIUM: 1.8 mg/dL (ref 1.7–2.4)

## 2018-08-31 MED ORDER — FOLIC ACID 1 MG PO TABS: 1.0000 mg | ORAL_TABLET | Freq: Two times a day (BID) | ORAL | 2 refills | Status: DC

## 2018-08-31 MED ORDER — CYANOCOBALAMIN 1000 MCG/ML IJ SOLN
INTRAMUSCULAR | Status: AC
  Filled 2018-08-31: qty 1

## 2018-08-31 MED ORDER — CYANOCOBALAMIN 1000 MCG/ML IJ SOLN
1000.0000 ug | Freq: Once | INTRAMUSCULAR | Status: AC
  Administered 2018-08-31: 1000 ug via INTRAMUSCULAR

## 2018-08-31 NOTE — Patient Instructions (Addendum)
The results of your laboratory studies from today were reviewed and discussed in detail.  Vitamin B12 by injection will be given today.  Labs, doctor visit, and vitamin B12 injection on November 21.  Barring any unforeseen complications, your next scheduled doctor visit with laboratory studies is on December 6.  Please do not hesitate to call should any new or untoward problems arise in the interim.  Thank you! Happy Thanksgiving!  Ladona Ridgel, MD,

## 2018-08-31 NOTE — Telephone Encounter (Signed)
Gave pt avs and calendar  °

## 2018-08-31 NOTE — Progress Notes (Signed)
Hematology/Oncology Outpatient Progress Note  Patient Name:  Barbara Stanley  DOB:1971-07-01   Date of Service: August 31, 2018  Referring Provider: Lanae Boast, [place], [place] [address]   Consulting Physician: Henreitta Leber, MD Hematology/Oncology  Subjective: "I have low blood counts due to vitamin V67 and folic acid deficiency, and am here for the results of my laboratory studies, vitamin B12 injection."  Objective: Past Medical History:  Diagnosis Date  . Anemia   . COPD (chronic obstructive pulmonary disease) ([place])   . Depression   . Emphysema   . GERD (gastroesophageal reflux disease)   . Tobacco abuse    Review of Systems: Constitutional: No fever, sweats, or shaking chills.   Improved appetite without significant weight loss.  Skin: No rash, scaling, sores, lumps, or jaundice; pallor. HEENT: No visual changes; unilateral right hearing deficit. Pulmonary: No unusual cough, sore throat, or orthopnea; active tobacco dependence/COPD. Breasts: No complaints; she is scheduled for mammogram. Cardiovascular: No coronary artery disease, angina, or myocardial infarction.  No cardiac dysrhythmia, essential hypertension, or dyslipidemia. Gastrointestinal: No indigestion, dysphagia, abdominal pain, or constipation.  Chronic diarrhea loose/semi-formed/watery, numbering 3 daily without abdominal cramps; no tenesmus; GERD. She denies melena or bright red blood per rectum. GI evaluation scheduled. Genitourinary: No urinary frequency, urgency, hematuria, or dysuria. Musculoskeletal: DJD: Left shoulder, lower back, and knees bilaterally. No new arthralgias or myalgias; no joint swelling, pain, or instability. Hematologic: No bleeding tendency or easy bruisability. Endocrine: No intolerance to hot or cold; no thyroid disease or diabetes mellitus. Vascular: No peripheral arterial or venous thromboembolic disease. Psychological: Major depression, or  mood changes; no mental health illnesses. Neurological: Positional dizziness. The previous facial twitching suggestive of tardive dyskinesia has improved significantly; no syncope or near syncopal episodes; no numbness or tingling in the fingers or toes.  Physical Examination: Vital Signs: Body surface area is 2.09 meters squared. Body mass index is 30.64 kg/m. Vitals:   08/31/18 1414  BP: 123/78  Pulse: 66  Resp: 18  Temp: 98.8 F (37.1 C)  SpO2: 98%    Filed Weights   08/31/18 1414  Weight: 201 lb 8 oz (91.4 kg)  ECOG PERFORMANCE STATUS: 1 Constitutional:  Barbara Stanley is fully nourished and developed. She looks older than her stated age.  She is friendly and cooperative without respiratory compromise at rest. Skin: No rashes, scaling, jaundice, or itching.  Dry skin and pallor. HEENT: Head is normocephalic and atraumatic.  Pupils are equal round and reactive to light and accommodation.  Sclerae are anicteric. Conjunctivae are pale. No sinus tenderness nor oropharyngeal lesions.  Lips without cracking or peeling; tongue without mass, inflammation, or nodularity.  Mucous membranes are moist. Neck: Supple and symmetric.  No jugular venous distention or thyromegaly.  Trachea is midline. Lymphatics: No cervical or supraclavicular lymphadenopathy.  No epitrochlear, axillary, or inguinal lymphadenopathy is appreciated. Respiratory/chest: Thorax is symmetrical.  Breath sounds are clear to auscultation and percussion.  Normal excursion and respiratory effort. Back: Symmetric without deformity or tenderness. Cardiovascular: Heart rate and rhythm are regular without murmurs. Gastrointestinal: Abdomen is soft, nontender; no organomegaly.  Bowel sounds are normoactive.  No masses are appreciated. Extremities: In the lower extremities, there is no asymmetric swelling, erythema, tenderness, or cord formation. No clubbing or cyanosis.  Her ankles are thick with trace pitting edema. Hematologic: No  petechiae, hematomas, or ecchymoses. Psychological:  She is oriented to person, place, and time; normal affect; memory and cognitive deficit.  Neurological:  There are no gross sensory or motor deficits.   Past Medical History Reviewed        Family History Reviewed        Social History Reviewed  Allergies  Allergen Reactions  . Other     "Banana Bag" given to patient at physician's office  She has no food allergies. She has nonspecific seasonal allergies.  Current Outpatient Medications on File Prior to Visit  Medication Sig  . acetaminophen (TYLENOL) 500 MG tablet Take 1,000 mg by mouth daily as needed for moderate pain.  [name] albuterol (PROAIR HFA) 108 (90 Base) MCG/ACT inhaler Inhale 2 puffs into the lungs every 6 (six) hours as needed for wheezing or shortness of breath.  . budesonide-formoterol (SYMBICORT) 160-4.5 MCG/ACT inhaler Inhale 2 puffs into the lungs 2 (two) times daily.  . cyanocobalamin (,VITAMIN B-12,) 1000 MCG/ML injection Inject 1 mL (1,000 mcg total) into the muscle once a week for 4 doses.  [name] esomeprazole (NEXIUM) 40 MG capsule TAKE 1 CAPSULE BY MOUTH EVERY DAY  . folic acid (FOLVITE) 1 MG tablet Take 1 tablet (1 mg total) by mouth daily.  [name] loperamide (IMODIUM) 2 MG capsule Take 1 capsule (2 mg total) by mouth 4 (four) times daily as needed for diarrhea or loose stools.  . metroNIDAZOLE (FLAGYL) 500 MG tablet Take 1 tablet (500 mg total) by mouth 3 (three) times daily. (Patient not taking: Reported on 08/23/2018)  . tiotropium (SPIRIVA) 18 MCG inhalation capsule Place 1 capsule (18 mcg total) daily into inhaler and inhale.  . traZODone (DESYREL) 150 MG tablet Take 1 tablet (150 mg total) by mouth at bedtime. For sleep  . venlafaxine XR (EFFEXOR-XR) 75 MG 24 hr capsule Take 1 capsule (75 mg total) by mouth daily with breakfast. For depression   No current facility-administered medications on file prior to visit.    Laboratory Studies: August 31, 2018  Ref Range &  Units 13:49 (08/31/18) 8d ago (08/23/18) 2wk ago (08/17/18) 2wk ago (08/15/18) 2wk ago (08/12/18)  WBC Count 4.0 - 10.5 K/uL 3.6Low   2.3Low   3.5Low   3.4Low   1.7Low    RBC 3.87 - 5.11 MIL/uL 3.95  3.98  3.73Low   3.70Low   3.67Low    Hemoglobin 12.0 - 15.0 g/dL 11.6Low   11.3Low   10.6Low   10.3Low   10.1Low    HCT 36.0 - 46.0 % 36.7  37.5  35.4Low   34.5Low   34.9Low    MCV 80.0 - 100.0 fL 92.9  94.2  94.9  93.2  95.1   MCH 26.0 - 34.0 pg 29.4  28.4  28.4  27.8  27.5   MCHC 30.0 - 36.0 g/dL 31.6  30.1  29.9Low   29.9Low   28.9Low    RDW 11.5 - 15.5 % 20.0High   21.3High   22.5High   22.5High   22.3High    Platelet Count 150 - 400 K/uL 18Low   25Low  CM 42Low  CM 44Low  CM 21Low Panic  CM  Comment: Immature Platelet Fraction may be  clinically indicated, consider  ordering this additional test  [id number]   nRBC 0.0 - 0.2 % 0.0  0.0  0.0  0.0  0.0   Neutrophils Relative % % 65  65  65  67  54   Neutro Abs 1.7 - 7.7 K/uL 2.4  1.5Low   2.3  2.4  0.9Low    Lymphocytes Relative % 26  27  22  20  34   Lymphs Abs 0.7 - 4.0 K/uL 0.9  0.6Low   0.8  0.7  0.6Low    Monocytes Relative % '6  7  10  10  9   ' Monocytes Absolute 0.1 - 1.0 K/uL 0.2  0.2  0.3  0.3  0.2   Eosinophils Relative % 1  0  '1  1  1   ' Eosinophils Absolute 0.0 - 0.5 K/uL 0.0  0.0  0.0  0.0  0.0   Basophils Relative % '1  1  1  1  1   ' Basophils Absolute 0.0 - 0.1 K/uL 0.0  0.0  0.0  0.0  0.0   Immature Granulocytes % 1  0  '1  1  1   ' Abs Immature Granulocytes 0.00 - 0.07 K/uL 0.02  0.01 CM 0.02 CM 0.02 CM 0.01    August 23, 2018  Ref Range & Units 10:36 8d ago 11d ago 12d ago 13d ago  Sodium 135 - 145 mmol/L 142  143  146High   141  146High    Potassium 3.5 - 5.1 mmol/L 3.8  3.7  4.0  3.6  4.0 CM  Chloride 98 - 111 mmol/L 109  108  110  110  110   CO2 22 - 32 mmol/L '27  30  30  29  28   ' Glucose, Bld 70 - 99 mg/dL 81  76  85  84  87   BUN 6 - 20 mg/dL '7  8  8  9  8   ' Creatinine, Ser 0.44 - 1.00 mg/dL 0.66  0.64  0.59  0.67   0.59   Calcium 8.9 - 10.3 mg/dL 8.5Low   8.0Low   8.2Low   7.7Low   8.0Low    Total Protein 6.5 - 8.1 g/dL 5.3Low   5.0Low       Albumin 3.5 - 5.0 g/dL 3.1Low   2.8Low       AST 15 - 41 U/L 25  20      ALT 0 - 44 U/L 23  26      Alkaline Phosphatase 38 - 126 U/L 84  88      Total Bilirubin 0.3 - 1.2 mg/dL 1.0  0.9      GFR calc non Af Amer >60 mL/min >60  >60  >60  >60  >60    August 15, 2018  Ref Range & Units 3d ago 6d ago 7d ago 8d ago 9d ago  Sodium 135 - 145 mmol/L 143  146High   141  146High   144   Potassium 3.5 - 5.1 mmol/L 3.7  4.0  3.6  4.0 CM 2.8Low    Chloride 98 - 111 mmol/L 108  110  110  110  109   CO2 22 - 32 mmol/L '30  30  29  28  29   ' Glucose, Bld 70 - 99 mg/dL 76  85  84  87  82   BUN 6 - 20 mg/dL '8  8  9  8  9   ' Creatinine, Ser 0.44 - 1.00 mg/dL 0.64  0.59  0.67  0.59  0.52   Calcium 8.9 - 10.3 mg/dL 8.0Low   8.2Low   7.7Low   8.0Low   7.2Low    Total Protein 6.5 - 8.1 g/dL 5.0Low        Albumin 3.5 - 5.0 g/dL 2.8Low        AST 15 - 41 U/L 20  ALT 0 - 44 U/L 26       Alkaline Phosphatase 38 - 126 U/L 88       Total Bilirubin 0.3 - 1.2 mg/dL 0.9       GFR calc non Af Amer >60 mL/min >60  >60  >60  >60  >60   GFR calc Af Amer >60 mL/min >60  >60 CM >60 CM >60 CM >60 CM  Comment: (NOTE)         Ref Range & Units 14:14  Retic Ct Pct 0.4 - 3.1 % 2.6   RBC. 3.87 - 5.11 MIL/uL 3.70Low    Retic Count, Absolute 19.0 - 186.0 K/uL 96.6   Immature Retic Fract 2.3 - 15.9 % 23.4High    Reticulocyte Hemoglobin >27.9 pg 35.2   Comment:     Given the high negative predictive  value of a RET-He result > 32 pg  iron deficiency is essentially  excluded. If this patient is anemic  other etiologies should be  considered.   Uric acid 4.8 LDH 297 Folic acid 4.0 Vitamin B12 911  Haptoglobin 72  Ferritin 25  Iron/TIBC 54/383  Iron saturation 14%  Diagnostic Imaging: August 09, 2018 ABDOMEN ULTRASOUND COMPLETE  COMPARISON:  None.  FINDINGS: Gallbladder: Previous cholecystectomy.  Common bile duct: Diameter: 6.6 mm  Liver: No focal lesion identified. Within normal limits in parenchymal echogenicity. Portal vein is patent on color Doppler imaging with normal direction of blood flow towards the liver.  IVC: No abnormality visualized.  Pancreas: Visualized portion unremarkable.  Spleen: Spleen upper limits of normal in size measuring 12.6 cm in length with a volume of 397 cc.  Right Kidney: Length: 12.3 cm. Echogenicity within normal limits. No mass or hydronephrosis visualized.  Left Kidney: Length: 12.3 cm. Echogenicity within normal limits. No mass or hydronephrosis visualized.  Abdominal aorta: No aneurysm visualized.  Other findings: None.  IMPRESSION: 1. The common bile duct is upper limits of normal in caliber at 6.6 mm. Status post cholecystectomy. If there is a clinical concern for choledocholithiasis MRCP may be helpful for further assessment. 2. Spleen upper limits of normal in size.  Kerby Moors M.D. 08/09/2018 12:28  Assessment/Summary: In the setting of pancytopenia, discharged from [facility] on October 27; started on parenteral vitamin L89 and folic acid for presumptive severe pancytopenia secondary to undernutrition and vitamin deficiencies;  previously noncompliant with medication, she presents now for the results of her laboratory studies after starting parenteral vitamin B12 by injection in the [facility], and after starting folic acid.  On August 08, 2018 she presented to the emergency department at [facility] after being sent by her primary care provider due to hypokalemia and weakness.  Her serum potassium was 2.9.  She complained also of diarrhea extending over 3-week period.  She had been to the emergency department on October 17 where she was given intravenous and oral potassium. When her labs on the outside were  found to be abnormal, she was sent to the emergency department for further evaluation. She just recently completed a course of moxifloxacin for possible pneumonia. She denied any recent travel outside the country. She has never had Clostritium difficile colitis. She has no history of inflammatory bowel disease, viral hepatitis, or diverticulosis. She is not a vegetarian.  Throughout her hospital stay, she was given intravenous fluid resuscitation and electrolyte replacement. Laboratory studies performed suggested severe pancytopenia and vitamin Q11 and folic acid deficiency. She was given vitamin B12: 1000  mcg IM daily for 3 days. Folic acid: 1 mg once daily was supplemented. When she steadily improved, she was discharged on October 27. A prescription for esomeprazole: 40 mg once daily was written.  An appointment with [facility], was scheduled for November 20. She has never had a screening colonoscopy.  Although home health was to arrange vitamin B12 injections at home, her insurance would not provide coverage. Due to financial constrictions, they have been unable to obtain folic acid until 2 days ago. She was started on folic acid: 1 mg twice daily.  She has had gastroesophageal reflux disease over a lengthy but indefinite period.  She states that she has had bowel problems since her teenage years. She normally moves her bowels 5-6 times daily but extremely loose. Over the past several weeks however she has had bowel movements every hour (20 BMs daily) during this acute period. While hospitalized she was given loperamide. Upon discharge, she moved her bowels 10 times daily described as semi-formed almost watery. Since her last visit, she now has 3 stools daily which are semi-formed/loose.  She now takes Imodium: 3-4 times daily. In the distant past, she was on iron supplementation.  She vaguely remembers that vitamin S56 and folic acid were recommended  following her gastric bypass.  She reports  no melena or bright red blood per rectum.  At the time of her initial visit on October 30, arrangements were made to have vitamin B12: 1000 mcg IM given daily in the [facility] for 3 consecutive days.  She previously received 3 days of vitamin B12 while hospitalized. It was recommended that they obtain folic acid: 1 mg once daily by prescription. A consultation with social service has been requested.   The results of the C. difficile quick screen with PCR reflex was negative from October 31.  Qualitative fecal lactoferrin was positive.  A stool culture for E. coli; Shigella toxin; Salmonella and Shigella were not identified. Campylobacter culture was negative. Empirically she was given a brief course of metronidazole.  This was discontinued last week.  Because her serum ferritin was low, she received ferumoxytol: 510 mg IVPB today along with week 2/4 of continued parenteral vitamin B12.  In addition she continues on folic acid: 1 mg twice daily.  She states her energy level is unchanged. Her appetite has improved while her weight remains stable. She has no unusual headaches, syncope, near syncopal episodes. She does have positional dizziness. There is no rash or itching. She has no cough, sore throat, orthopnea. She denies dyspnea either at rest or on minimal exertion. She denies pain or difficulty in swallowing. No fever, shaking chills, sweats, or flulike symptoms are reported. She has dyspepsia which has improved significantly since starting esomeprazole. She denies vomiting or constipation. She no longer has daily nausea.  No urinary frequency, urgency, hematuria, dysuria are reported.  She has no significant swelling of her ankles. There are no new arthralgias or myalgias.  She denies bleeding tendency or easy bruisability. There is intermittent tingling of her left hand and feet bilaterally.   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  Recommendation/Plan: The results of her laboratory studies from today suggest a slow but gradual improvement with parenteral vitamin C12 and folic acid. Her platelet count however remains low at 18,000.  She has no bleeding or stigmata of low platelets. Those results are detailed above.  Folic acid was only started on October 30.  A repeat folic acid level is pending from today.  If her platelets do not increase appropriately, a bone marrow aspiration and biopsy will likely be recommended. I explained to her in detail the procedure, role, rationale, and mechanics of a bone marrow aspiration and biopsy if needed.  It was recommended that she continue low-dose loperamide: 1 caplet after each loose stool (maximum of 4 caplets daily). She generally takes 3-4 caplets daily. Her stools are now semi-formed but numbering 3 daily. It was recommended that she use the brand name Imodium AD. She has an appointment with Dr. Thornton Park, [facility] on November 20.  It was recommended that she continue folic acid: 1 mg twice daily.  A prescription for refill was sent to her pharmacy.  Barring any unforeseen complications, her next scheduled doctor visit with laboratory studies and parenteral vitamin B12 (week 2/4: Vitamin B12) is on November 15.  Her weekly treatment thereafter will be on September 06, 2018 (week 3/4: Vitamin B12).  She was advised to call us in the interim should any new or untoward problems arise.  The total time spent discussing the results of her laboratory studies, importance of compliance with folic acid replacement, rationale on the use of loperamide, and discussion was 25 minutes. At least 50% of that time was spent in face-to-face discussion,  counseling, and answering questions.  This note was dictated using voice activated technology/software.  Unfortunately, typographical errors are not uncommon, and transcription is subject to mistakes and regrettably misinterpretation.  If necessary, clarification of the above information can be discussed with me at any time.  FOLLOW UP: AS DIRECTED   cc:      Lanae Boast, FNP            Thornton Park MD   Henreitta Leber, MD  Hematology/Oncology [facility] [address] [place], [place] [address] Office: [phone] OHKG: [phone]

## 2018-09-03 ENCOUNTER — Ambulatory Visit: Payer: Self-pay | Admitting: Family Medicine

## 2018-09-05 ENCOUNTER — Encounter: Payer: Self-pay | Admitting: Gastroenterology

## 2018-09-05 ENCOUNTER — Other Ambulatory Visit (INDEPENDENT_AMBULATORY_CARE_PROVIDER_SITE_OTHER): Payer: Medicaid Other

## 2018-09-05 ENCOUNTER — Ambulatory Visit (INDEPENDENT_AMBULATORY_CARE_PROVIDER_SITE_OTHER): Payer: Medicaid Other | Admitting: Gastroenterology

## 2018-09-05 VITALS — BP 106/72 | HR 70 | Ht 68.0 in | Wt 208.0 lb

## 2018-09-05 DIAGNOSIS — R197 Diarrhea, unspecified: Secondary | ICD-10-CM

## 2018-09-05 DIAGNOSIS — K219 Gastro-esophageal reflux disease without esophagitis: Secondary | ICD-10-CM

## 2018-09-05 DIAGNOSIS — D61818 Other pancytopenia: Secondary | ICD-10-CM | POA: Diagnosis not present

## 2018-09-05 LAB — TSH: TSH: 0.74 u[IU]/mL (ref 0.35–4.50)

## 2018-09-05 LAB — C-REACTIVE PROTEIN: CRP: <0.1 mg/dL — ABNORMAL LOW (ref 0.5–20.0)

## 2018-09-05 LAB — SEDIMENTATION RATE: Sed Rate: 5 mm/hr (ref 0–20)

## 2018-09-05 LAB — IGA: IGA: 162 mg/dL (ref 68–378)

## 2018-09-05 NOTE — Progress Notes (Signed)
Referring Provider: Lanae Boast, FNP Primary Care Physician:  Lanae Boast, [place]   Reason for Consultation:  Diarrhea   IMPRESSION:  Acute on chronic diarrhea with recent hospitalization for associated hypokalemia    - diarrhea initially developed as a teenager with 4 BM at baseline    - C diff and culture negative    - improved with loperamide and lomotil and empiric metronidazole GERD with breakthrough symptoms despite daily Nexium 40 mg daily Recent diagnosis of B12, folate, and iron deficiencies presenting with pancytopenia Pancytopenia with platelets of 18,000 Gastric bypass 2003 Recent elevated transaminases while hospitalized, now normal    - 08/09/18 abdominal ultrasound revealed no source Prior cholecystectomy BMI 31 Tobacco habituation  Acute on chronic diarrhea, now with multiple anemias occurring post gastric bypass. She has not taking any nutritional supplements since that time.  The differential includes GI conditions that cause the diarrhea including:  IBD, celiac disease, malabsorption related to gastric bypass, microscopic colitis. Other possible causes of the diarrhea but not as much the deficiencies include bile acid diarrhea, Giardia, food intolerance, thyroid disorder, other functional GI disease/IBS.  Given this differential I am recommending an upper endoscopy with duodenal biopsies and a colonoscopy with random colon biopsies.  However her severe pancytopenia and associated thrombocytopenia limit her candidacy for endoscopy at this time.  She reports weekly appointments with hematology to monitor her platelets.  I have asked her to call and schedule her colonoscopy when her platelets are above 50,000.  We will proceed with labs and stool studies to further characterize her diarrhea in the meantime.  She did not feel that medical therapy was necessary.  I recommended ongoing use of Imodium as needed and suggested that she add a daily psyllium stool bulking  agent.  Abnormal liver enzymes may have been related to acute illness and/or medications. Liver enzymes have normalized. Abdominal ultrasound was overall reassuring.    PLAN: CRP, ESR, Fecal calprotectin TTGA and IgA Giardia TSH Continue Imodium PRN Trial of psyllium for stool bulking (Metamucil, Benefiber, etc) EGD with biopsies and colonoscopy with biopsies when her platelets improve to >50,000  I consented the patient at the bedside today discussing the risks, benefits, and alternatives to endoscopic evaluation. In particular, we discussed the risks that include, but are not limited to, reaction to medication, cardiopulmonary compromise, bleeding requiring blood transfusion that is heightened in the setting thrombocytopenia, aspiration resulting in pneumonia, perforation requiring surgery, and even death. We reviewed the risk of missed lesion including polyps or even cancer. The patient acknowledges these risks and asks that we proceed when it is safe to do so.   HPI: Barbara Stanley is a 47 y.o. female seen in consultation at the request of NP Nathaneil Canary for further evaluation of diarrhea.  The history is obtained through the patient, her husband who accompanies her to this appointment,  and review of her electronic health record. Recently hospitalized for diarrhea-induced hypokalemia and pancytopenia. Recently started on parenteral vitamin Z85 and folic acid for presumptive severe pancytopenia secondary to undernutrition and vitamin deficiencies with b12, folate, and iron. She has a history of chronic obstructive pulmonary disease; major depression; gastric bypass surgery 16 years ago; unilateral right hearing deficit; chronic anemia; gastroesophageal reflux disease; active tobacco dependence; degenerative joint disease involving the left shoulder, lower back, and knees bilaterally.  Diarrhea predates gastric bypass and probably started as a teenager. Gastric bypass performed 16 years ago  [place]. Not on any long term supplements.  Baseline bowel habits following  th bypass are diarrhea - 4 slightly formed BM at baseline. Worsened in October with >20 watery, explosive BM daily. Associated nausea. No blood or mucous. No abdominal pain.  Improved over the last week taking 4 Imodium daily. She just recently completed a course of moxifloxacin for possible pneumonia prior to her hospitalization.No other new medications.  No travel. No history of D diff.  No unusual pets at home (two dogs and a cat)   The results of the C. difficile quick screen with PCR reflex was negative from October 31.  Qualitative fecal lactoferrin was positive.  A stool culture for E. coli; Shigella toxin; Salmonella and Shigella were not identified. Campylobacter culture was negative. Empirically she was given a brief course of metronidazole.    GI ROS is otherwise negative.  No prior colonoscopy or colon cancer screening.   EGD for reflux several years ago "somewhere in [place]." On chronic Nexium 40 mg daily. Breakthrough symptoms over the last few weeks to months. Change coincides with diarrhea.  No dysphagia or odynophagia.  An abdominal ultrasound for abnormal liver enzymes 10/24 9/19 that showed a common bile duct of 6.6 mm status post cholecystectomy.  Spleen is upper limit of normal at 12.6 cm. Transaminases at that time were elevated. They have since been repeatedly normal on several rechecks.   Past Medical History:  Diagnosis Date  . Anemia   . COPD (chronic obstructive pulmonary disease) ([facility])   . Depression   . Emphysema   . GERD (gastroesophageal reflux disease)   . Tobacco abuse     Past Surgical History:  Procedure Laterality Date  . ABDOMINAL HYSTERECTOMY    . c section x 3     . CHOLECYSTECTOMY    . GASTRIC BYPASS      Current Outpatient Medications  Medication Sig Dispense Refill  . acetaminophen (TYLENOL) 500 MG tablet Take 1,000 mg by mouth daily as needed for moderate pain.    [name]  albuterol (PROAIR HFA) 108 (90 Base) MCG/ACT inhaler Inhale 2 puffs into the lungs every 6 (six) hours as needed for wheezing or shortness of breath. 1 each 5  . budesonide-formoterol (SYMBICORT) 160-4.5 MCG/ACT inhaler Inhale 2 puffs into the lungs 2 (two) times daily. 1 Inhaler 12  . esomeprazole (NEXIUM) 40 MG capsule TAKE 1 CAPSULE BY MOUTH EVERY DAY 90 capsule 0  . folic acid (FOLVITE) 1 MG tablet Take 1 tablet (1 mg total) by mouth daily. 30 tablet 0  . folic acid (FOLVITE) 1 MG tablet Take 1 tablet (1 mg total) by mouth 2 (two) times daily with a meal. 60 tablet 2  . loperamide (IMODIUM) 2 MG capsule Take 1 capsule (2 mg total) by mouth 4 (four) times daily as needed for diarrhea or loose stools. 30 capsule 0  . metroNIDAZOLE (FLAGYL) 500 MG tablet Take 1 tablet (500 mg total) by mouth 3 (three) times daily. (Patient not taking: Reported on 08/23/2018) 30 tablet 0  . tiotropium (SPIRIVA) 18 MCG inhalation capsule Place 1 capsule (18 mcg total) daily into inhaler and inhale. 30 capsule 12  . traZODone (DESYREL) 150 MG tablet Take 1 tablet (150 mg total) by mouth at bedtime. For sleep 30 tablet 0  . venlafaxine XR (EFFEXOR-XR) 75 MG 24 hr capsule Take 1 capsule (75 mg total) by mouth daily with breakfast. For depression 30 capsule 0   No current facility-administered medications for this visit.     Allergies as of 09/05/2018 - Review Complete 08/31/2018  Allergen Reaction  Noted  . Other  08/08/2018    Family History  Problem Relation Age of Onset  . Diabetes Mother   . Diabetes Other   . Hypertension Other   . CAD Other     Social History   Socioeconomic History  . Marital status: Single    Spouse name: Not on file  . Number of children: Not on file  . Years of education: Not on file  . Highest education level: Not on file  Occupational History  . Not on file  Social Needs  . Financial resource strain: Not on file  . Food insecurity:    Worry: Not on file    Inability:  Not on file  . Transportation needs:    Medical: Not on file    Non-medical: Not on file  Tobacco Use  . Smoking status: Current Every Day Smoker    Packs/day: 1.00    Years: 1.00    Pack years: 1.00    Types: Cigarettes  . Smokeless tobacco: Never Used  Substance and Sexual Activity  . Alcohol use: No    Comment: occas  . Drug use: No  . Sexual activity: Yes    Birth control/protection: None  Lifestyle  . Physical activity:    Days per week: Not on file    Minutes per session: Not on file  . Stress: Not on file  Relationships  . Social connections:    Talks on phone: Not on file    Gets together: Not on file    Attends religious service: Not on file    Active member of club or organization: Not on file    Attends meetings of clubs or organizations: Not on file    Relationship status: Not on file  . Intimate partner violence:    Fear of current or ex partner: Not on file    Emotionally abused: Not on file    Physically abused: Not on file    Forced sexual activity: Not on file  Other Topics Concern  . Not on file  Social History Narrative  . Not on file    Review of Systems: 12 system ROS is negative except as noted above.  There were no vitals filed for this visit.  Physical Exam: Vital signs were reviewed. General:   Alert, well-nourished, pleasant and cooperative in NAD Head:  Normocephalic and atraumatic. Eyes:  Sclera clear, no icterus.   Conjunctiva pink. Mouth:  No deformity or lesions.   Neck:  Supple; no thyromegaly. Lungs:  Clear throughout to auscultation.   No wheezes.  Heart:  Regular rate and rhythm; no murmurs Abdomen:  Soft, nontender, central obesity, normal bowel sounds. No rebound or guarding. No hepatosplenomegaly. Well healed midline surgical scar.  Rectal:  Deferred  Msk:  Symmetrical without gross deformities. Extremities:  No gross deformities or edema. Neurologic:  Alert and  oriented x4;  grossly nonfocal Skin:  No rash or bruise.  Palmar erythema present. No spider angioma.  Psych:  Alert and cooperative. Normal mood and affect.   Alisse Tuite L. Tarri Glenn Md, MPH [facility] 09/05/2018, 6:33 AM

## 2018-09-05 NOTE — Patient Instructions (Signed)
Your provider has requested that you go to the basement level for lab work before leaving today. Press "B" on the elevator. The lab is located at the first door on the left as you exit the elevator.  Try a daily fiber powder supplement such as Metamucil or benefiber.   Continue Imodium as needed.   Call back to schedule your endoscopy and colonoscopy.

## 2018-09-06 ENCOUNTER — Inpatient Hospital Stay (HOSPITAL_BASED_OUTPATIENT_CLINIC_OR_DEPARTMENT_OTHER): Payer: Medicaid Other | Admitting: Hematology and Oncology

## 2018-09-06 ENCOUNTER — Encounter: Payer: Self-pay | Admitting: Hematology and Oncology

## 2018-09-06 ENCOUNTER — Other Ambulatory Visit: Payer: Medicaid Other

## 2018-09-06 ENCOUNTER — Inpatient Hospital Stay: Payer: Medicaid Other

## 2018-09-06 VITALS — BP 116/64 | HR 61 | Temp 98.7°F | Resp 18 | Ht 68.0 in | Wt 205.6 lb

## 2018-09-06 DIAGNOSIS — D509 Iron deficiency anemia, unspecified: Secondary | ICD-10-CM | POA: Diagnosis not present

## 2018-09-06 DIAGNOSIS — D61818 Other pancytopenia: Secondary | ICD-10-CM

## 2018-09-06 DIAGNOSIS — D696 Thrombocytopenia, unspecified: Secondary | ICD-10-CM | POA: Diagnosis not present

## 2018-09-06 DIAGNOSIS — E538 Deficiency of other specified B group vitamins: Secondary | ICD-10-CM

## 2018-09-06 DIAGNOSIS — K219 Gastro-esophageal reflux disease without esophagitis: Secondary | ICD-10-CM

## 2018-09-06 DIAGNOSIS — R197 Diarrhea, unspecified: Secondary | ICD-10-CM

## 2018-09-06 DIAGNOSIS — E639 Nutritional deficiency, unspecified: Secondary | ICD-10-CM

## 2018-09-06 LAB — CBC WITH DIFFERENTIAL (CANCER CENTER ONLY)
Abs Immature Granulocytes: 0.02 10*3/uL (ref 0.00–0.07)
BASOS ABS: 0 10*3/uL (ref 0.0–0.1)
BASOS PCT: 1 %
EOS ABS: 0 10*3/uL (ref 0.0–0.5)
EOS PCT: 1 %
HCT: 37.7 % (ref 36.0–46.0)
Hemoglobin: 11.8 g/dL — ABNORMAL LOW (ref 12.0–15.0)
Immature Granulocytes: 1 %
Lymphocytes Relative: 26 %
Lymphs Abs: 0.8 10*3/uL (ref 0.7–4.0)
MCH: 29.7 pg (ref 26.0–34.0)
MCHC: 31.3 g/dL (ref 30.0–36.0)
MCV: 95 fL (ref 80.0–100.0)
MONO ABS: 0.2 10*3/uL (ref 0.1–1.0)
Monocytes Relative: 6 %
NRBC: 0 % (ref 0.0–0.2)
Neutro Abs: 2.1 10*3/uL (ref 1.7–7.7)
Neutrophils Relative %: 65 %
PLATELETS: 33 10*3/uL — AB (ref 150–400)
RBC: 3.97 MIL/uL (ref 3.87–5.11)
RDW: 18.9 % — AB (ref 11.5–15.5)
WBC: 3.1 10*3/uL — AB (ref 4.0–10.5)

## 2018-09-06 LAB — COMPREHENSIVE METABOLIC PANEL
ALT: 14 U/L (ref 0–44)
AST: 16 U/L (ref 15–41)
Albumin: 3.1 g/dL — ABNORMAL LOW (ref 3.5–5.0)
Alkaline Phosphatase: 83 U/L (ref 38–126)
Anion gap: 7 (ref 5–15)
BILIRUBIN TOTAL: 1.3 mg/dL — AB (ref 0.3–1.2)
BUN: 9 mg/dL (ref 6–20)
CHLORIDE: 107 mmol/L (ref 98–111)
CO2: 28 mmol/L (ref 22–32)
CREATININE: 0.69 mg/dL (ref 0.44–1.00)
Calcium: 8.4 mg/dL — ABNORMAL LOW (ref 8.9–10.3)
Glucose, Bld: 81 mg/dL (ref 70–99)
Potassium: 3.3 mmol/L — ABNORMAL LOW (ref 3.5–5.1)
Sodium: 142 mmol/L (ref 135–145)
TOTAL PROTEIN: 5.2 g/dL — AB (ref 6.5–8.1)

## 2018-09-06 LAB — TISSUE TRANSGLUTAMINASE, IGA: (tTG) Ab, IgA: 1 U/mL

## 2018-09-06 MED ORDER — CYANOCOBALAMIN 1000 MCG/ML IJ SOLN
1000.0000 ug | Freq: Once | INTRAMUSCULAR | Status: AC
  Administered 2018-09-06: 1000 ug via INTRAMUSCULAR

## 2018-09-06 MED ORDER — CYANOCOBALAMIN 1000 MCG/ML IJ SOLN
INTRAMUSCULAR | Status: AC
  Filled 2018-09-06: qty 1

## 2018-09-06 NOTE — Patient Instructions (Signed)
We reviewed in detail the results of your laboratory studies from today.  You were given copies for your review. There continues to be a slow but steady improvement.  Increase Imodium AD: 2 tablets after each watery/loose stool (maximum 8 caplets daily).  You will receive vitamin B12 injection at the [facility] on November 29.  No labs necessary prior to injection.  Continue folic acid: 1 mg twice daily.  A follow-up visit with laboratory studies is already scheduled for 09/29/2023.  A follow-up visit with lab work on December 13 should be scheduled with another provider.  Happy Thanksgiving! Ladona Ridgel, MD Hematology/Oncology

## 2018-09-06 NOTE — Progress Notes (Signed)
Hematology/Oncology Outpatient Progress Note  Patient Name:  Barbara Stanley  DOB: [birth year]   Date of Service: September 06, 2018  Referring Provider: Lanae Boast, [place], [place] [address]  Consulting Physician: Henreitta Leber, MD Hematology/Oncology  Reason for Visit: In the setting of pancytopenia and multiple documented nutritional deficiencies; status post gastric bypass; chronic diarrhea now improved; ongoing aggressive replacement of parenteral iron and vitamin K93 along with folic acid, she presents now for repeat laboratory studies and vitamin B12 by injection.  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 Thornton Park, gastroenterology (See Dr. Tarri Glenn consultation for details) Anticipate upper and lower endoscopic evaluation once platelets are > 50,000 Giardia antigen and fecal calprotectin: Pending  Electrolyte imbalance Persistent hypokalemia (3.2) Likely exacerbated by diarrhea Poor nutrition and/or absorption  Chronic obstructive pulmonary disease Active tobacco dependence She was advised regarding smoking cessation Both pharmacologic and behavioral intervention recommended through primary care Budesonide-formoterol (SYMBICORT) 160-4.5 MCG/ACT inhaler Tiotropium (SPIRIVA) 18 MCG inhalation capsule Albuterol (PROAIR HFA) 108 (90 Base) MCG/ACT inhaler  Degenerative joint disease involving the left shoulder, lower back, and knees Acetaminophen: 500 mg every 6 hours as needed  Unilateral right hearing deficit  Chronic depression Venlafaxine XR (EFFEXOR-XR) 75 MG Trazodone (DESYREL) 150 MG  tablet  Recommendation/Plan: Vitamin B12: 1000 mcg IM today (week 3/4) She was advised to continue folic acid: 1 mg twice daily For hypokalemia (3.2) KDur: 40 mEq once daily for 5 days; then 20 mEq once daily for additional 5 days. She was instructed to increase Imodium AD: 2 caplets after each loose/watery stool as needed (maximum 8 tablets daily) On November 29: vitamin B12: 1000 mcg IM (4/4) will be given in the [facility]. On December 6: CBC, BMP, ferritin, magnesium, and phosphorus. On December 13: Labs and new provider reevaluation  Brief History: On August 08, 2018 she presented to the emergency department at [facility]  due tohypokalemia and weakness. Her serum potassium was 2.9. She complained also of diarrhea extending over 3-week period. She had been to the emergency department on October 17 where she was given intravenous and oral potassium. She just recently completed a course of moxifloxacin for possible pneumonia. She denied any recent travel outside the country. She has never had Clostritiumdifficile colitis. She has no history of inflammatory bowel disease, viral hepatitis, or diverticulosis. She is not a vegetarian.  Throughout her hospital stay, she was given intravenous fluid resuscitation and electrolyte replacement. Laboratory studies performed suggested severe pancytopenia and vitamin T24 and folic acid deficiency. She was given vitamin B12: 1000 mcg IM daily for 3 days. Folic acid: 1 mg once daily was supplemented.When she steadily improved, she was discharged on October 27. A prescription for esomeprazole: 40 mg once daily was written. She has never had a screening colonoscopy. Although home health was to arrange vitamin B12 injections at home, her insurance would not provide coverage. Due to financial constrictions, they have been unable to obtain folic acid until 2 days ago. She was started on folic acid: 1 mg twice daily.  She has had  gastroesophageal reflux disease over a lengthy but indefinite period. She states that she has had bowel problems since her teenage years. She normally moves her bowels 5-6  times daily but extremely loose. Over the past several weeks however she has had bowel movements every hour (20 BMs daily) during the acute period. While hospitalized she was given loperamide. Upon discharge, she moved her bowels 10 times daily, described as semi-formed almost watery. Since her last visit, she now has 3-4 stools daily which are semi-formed/loose.  She now takes Imodium: 3-4 times daily. In the distant past, she was on iron supplementation. She vaguely remembers that vitamin X44 and folic acid were recommended following her gastric bypass. She reports no melena or bright red blood per rectum.  At the time of her initial visit on October 30, arrangements were made to have vitamin B12: 1000 mcg IM given daily in the [facility] for 3 consecutive days.  She previously received 3 days of vitamin B12 while hospitalized. It was recommended that they obtain folic acid: 1 mg once twice daily. The results of the C. difficile quick screen with PCR reflex was negative from October 31.  Qualitative fecal lactoferrin was positive.  A stool culture for E. coli; Shigella toxin; Salmonella and Shigella were not identified. Campylobacter culture was negative. Empirically she was given a brief course of metronidazole.  This was discontinued after 6 days.  Because her serum ferritin was low, she received ferumoxytol: 510 mg IVPB today along with week 2/4 of continued parenteral vitamin B12.  In addition she continues on folic acid: 1 mg twice daily.  She presents today for laboratory studies and parenteral vitamin B12.  She states her energy level is unchanged.  Her diarrhea appears to have worsened slightly today.  She has had 4 loose to watery stools.  She denies any bright red blood per rectum. Her appetite has improved while her  weight remains stable. She has no unusual headaches, syncope, near syncopal episodes. She does have positional dizziness. There is no rash or itching. She has no cough, sore throat, orthopnea. She denies dyspnea either at rest or on minimal exertion. She denies pain or difficulty in swallowing. No fever, shaking chills, sweats, or flulike symptoms are reported. She has dyspepsia which has improved significantly since starting esomeprazole. She denies vomiting or constipation. She has episodic nausea. No urinary frequency, urgency, hematuria, dysuria are reported. She has no significant swelling of her ankles. There are no new arthralgias or myalgias. She denies bleeding tendency or easy bruisability.The intermittent tingling of her left hand and feet bilaterally have resolved.  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  Past Medical History Reviewed        Family History Reviewed       Social History Reviewed  Allergies  Allergen Reactions  . Other     "Banana Bag" given to patient at [facility] office   Current Outpatient Medications on File Prior to Visit  Medication Sig  . acetaminophen (TYLENOL) 500 MG tablet Take 1,000 mg by mouth daily as needed for moderate pain.  [name] albuterol (PROAIR HFA) 108 (90 Base) MCG/ACT inhaler Inhale 2 puffs into the lungs every 6 (six) hours as needed for wheezing or shortness of breath.  . budesonide-formoterol (SYMBICORT) 160-4.5 MCG/ACT inhaler Inhale 2 puffs into the lungs 2 (two) times daily.  [name] esomeprazole (NEXIUM) 40 MG capsule TAKE 1 CAPSULE BY MOUTH EVERY DAY  . folic acid (FOLVITE) 1 MG  tablet Take 1 tablet (1 mg total) by mouth daily.  . folic acid (FOLVITE) 1 MG  tablet Take 1 tablet (1 mg total) by mouth 2 (two) times daily with a meal.  . loperamide (IMODIUM) 2 MG capsule Take 1 capsule (2 mg total) by mouth 4 (four) times daily as needed for diarrhea or loose stools.  . tiotropium (SPIRIVA) 18 MCG inhalation capsule Place 1 capsule (18 mcg total) daily into inhaler and inhale.  . traZODone (DESYREL) 150 MG tablet Take 1 tablet (150 mg total) by mouth at bedtime. For sleep  . venlafaxine XR (EFFEXOR-XR) 75 MG 24 hr capsule Take 1 capsule (75 mg total) by mouth daily with breakfast. For depression   No current facility-administered medications on file prior to visit.     Review of Systems: Constitutional: No fever, sweats, or shaking chills.  Improved appetite without significant weight loss. Skin: No rash, scaling, sores, lumps, or jaundice; pallor. HEENT: No visual changes;unilateral right hearing deficit. Pulmonary: No unusual cough, sore throat, or orthopnea;active tobacco dependence/COPD. Breasts: No complaints;she is scheduled for mammogram. Cardiovascular: No coronary artery disease, angina, or myocardial infarction. No cardiac dysrhythmia, essential hypertension, or dyslipidemia. Gastrointestinal: No indigestion, dysphagia, abdominal pain,or constipation.Chronic diarrhea loose/semi-formed/watery, numbering 3-4 daily without abdominal cramps; no tenesmus; GERD. She denies melena or bright red blood per rectum.  Genitourinary: Nourinaryfrequency, urgency, hematuria, or dysuria. Musculoskeletal:DJD: Left shoulder, lower back, and knees bilaterally. Nonewarthralgias or myalgias; no joint swelling, pain, or instability. Hematologic: No bleeding tendency or easy bruisability. Endocrine: No intolerance to hot or cold; no thyroid disease or diabetes mellitus. Vascular: No peripheral arterial or venous thromboembolic disease. Psychological:Majordepression; no mood changes. Neurological: Positional dizziness. The previous facial twitching  suggestive of tardive dyskinesia has improved; no syncope or near syncopal episodes; no numbness or tingling in the fingers or toes.  Physical Examination: Vital Signs: Body surface area is 2.12 meters squared.  Vitals:   09/06/18 1523  BP: 116/64  Pulse: 61  Resp: 18  Temp: 98.7 F (37.1 C)  SpO2: 96%    Filed Weights   09/06/18 1523  Weight: 205 lb 9.6 oz (93.3 kg)  Body mass index is 31.26 kg/m. Constitutional:Diora Robinsonis fully nourished and developed. She looks older than her stated age.She is friendly and cooperative without respiratory compromise at rest. Skin: No rashes, scaling, jaundice, or itching.  Dry skin and pallor. HEENT: Head is normocephalic and atraumatic. Pupils are equal round and reactive to light and accommodation. Sclerae are anicteric. Conjunctivae are pale. No sinus tenderness nor oropharyngeal lesions. Lips without cracking or peeling; tongue without mass, inflammation, or nodularity. Mucous membranes are moist. Neck: Supple and symmetric. No jugular venous distention or thyromegaly. Trachea is midline. Lymphatics: No cervical or supraclavicular lymphadenopathy. No epitrochlear, axillary, or inguinal lymphadenopathy is appreciated. Respiratory/chest: Thorax is symmetrical. Breath sounds are clear to auscultation and percussion. Normal excursion and respiratory effort. Back: Symmetric without deformity or tenderness. Cardiovascular: Heart rate and rhythm are regular without murmurs. Gastrointestinal: Abdomen is soft, nontender; no organomegaly. Bowel sounds are normoactive. No masses are appreciated. Extremities: In the lower extremities, there is no asymmetric swelling, erythema, tenderness, or cord formation. No clubbingorcyanosis.Her ankles are thick with trace nonpitting edema. Hematologic: No petechiae, hematomas, or ecchymoses. Psychological: She is oriented to person, place, and time; normal affect;memoryand cognitive deficit.    Neurological:There are no gross sensory or motor deficits.  Laboratory Results: September 06, 2018  Ref Range & Units 14:59 (09/06/18) 6d ago (08/31/18) 2wk ago (08/23/18) 2wk ago (  08/17/18) 3wk ago (08/15/18)  WBC Count 4.0 - 10.5 K/uL 3.1Low   3.6Low   2.3Low   3.5Low   3.4Low    RBC 3.87 - 5.11 MIL/uL 3.97  3.95  3.98  3.73Low   3.70Low    Hemoglobin 12.0 - 15.0 g/dL 11.8Low   11.6Low   11.3Low   10.6Low   10.3Low    HCT 36.0 - 46.0 % 37.7  36.7  37.5  35.4Low   34.5Low    MCV 80.0 - 100.0 fL 95.0  92.9  94.2  94.9  93.2   MCH 26.0 - 34.0 pg 29.7  29.4  28.4  28.4  27.8   MCHC 30.0 - 36.0 g/dL 31.3  31.6  30.1  29.9Low   29.9Low    RDW 11.5 - 15.5 % 18.9High   20.0High   21.3High   22.5High   22.5High    Platelet Count 150 - 400 K/uL 33Low   18Low  CM 25Low  CM 42Low  CM 44Low  CM  Comment: Immature Platelet Fraction may be  clinically indicated, consider  ordering this additional test  [id number]   nRBC 0.0 - 0.2 % 0.0  0.0  0.0  0.0  0.0   Neutrophils Relative % % 65  65  65  65  67   Neutro Abs 1.7 - 7.7 K/uL 2.1  2.4  1.5Low   2.3  2.4   Lymphocytes Relative % _0 Lymphs Abs 0.7 - 4.0 K/uL 0.8  0.9  0.6Low   0.8  0.7   Monocytes Relative % _1 Monocytes Absolute 0.1 - 1.0 K/uL 0.2  0.2  0.2  0.3  0.3   Eosinophils Relative % 1  1  0  1  1   Eosinophils Absolute 0.0 - 0.5 K/uL 0.0  0.0  0.0  0.0  0.0   Basophils Relative % _2 Basophils Absolute 0.0 - 0.1 K/uL 0.0  0.0  0.0  0.0  0.0   Immature Granulocytes % 1  1  0  1  1   Abs Immature Granulocytes 0.00 - 0.07 K/uL 0.02  0.02 CM 0.01 CM 0.02 CM 0.02 CM    Ref Range & Units 14:59 (09/06/18) 6d ago (08/31/18) 2wk ago (08/23/18) 3wk ago (08/15/18) 3wk ago (08/12/18)  Sodium 135 - 145 mmol/L 142  141  142  143  146High    Potassium 3.5 - 5.1 mmol/L 3.3Low   3.7  3.8  3.7  4.0   Chloride 98 - 111 mmol/L 107  108  109  108  110   CO2 22 - 32 mmol/L _3 Glucose,  Bld 70 - 99 mg/dL 81  92  81  76  85   BUN 6 - 20 mg/dL _4 Creatinine, Ser 0.44 - 1.00 mg/dL 0.69  0.66  0.66  0.64  0.59   Calcium 8.9 - 10.3 mg/dL 8.4Low   8.4Low   8.5Low   8.0Low   8.2Low    Total Protein 6.5 - 8.1 g/dL 5.2Low   5.6Low   5.3Low   5.0Low     Albumin 3.5 - 5.0 g/dL 3.1Low   3.3Low   3.1Low   2.8Low  AST 15 - 41 U/L _0 ALT 0 - 44 U/L _1 Alkaline Phosphatase 38 - 126 U/L 83  89  84  88    Total Bilirubin 0.3 - 1.2 mg/dL 1.3High   0.7  1.0  0.9    GFR calc non Af Amer >60 mL/min >60  >60  >60  >60  >60   GFR calc Af Amer >60 mL/min >60  >60 CM >60 CM >60 CM >60 CM  Comment: (NOTE)   Ferritin: Pending Homocystine: Pending  August 15, 2018  Ref Range & Units 14:14  Retic Ct Pct 0.4 - 3.1 % 2.6   RBC. 3.87 - 5.11 MIL/uL 3.70Low   Retic Count, Absolute 19.0 - 186.0 K/uL 96.6   Immature Retic Fract 2.3 - 15.9 % 23.4High   Reticulocyte Hemoglobin >27.9 pg 35.2   Comment:  Given the high negative predictive  value of a RET-He result >32 pg  iron deficiency is essentially  excluded. If this patient is anemic  other etiologies should be  considered.   Uric acid 4.8 LDH 735 Folic acid 4.0 Vitamin B12 911  Haptoglobin 72  Ferritin 25  Iron/TIBC 54/383  Iron saturation 14%  Diagnostic/Imaging Studies: August 09, 2018 ABDOMEN ULTRASOUND COMPLETE  COMPARISON:  None.  FINDINGS: Gallbladder: Previous cholecystectomy.  Common bile duct: Diameter: 6.6 mm  Liver: No focal lesion identified. Within normal limits in parenchymal echogenicity. Portal vein is patent on color Doppler imaging with normal direction of blood flow towards the liver.  IVC: No abnormality visualized.  Pancreas: Visualized portion unremarkable.  Spleen: Spleen upper limits of normal in size measuring 12.6 cm in length with a volume of 397 cc.  Right Kidney: Length: 12.3 cm. Echogenicity within normal limits. No mass or  hydronephrosis visualized.  Left Kidney: Length: 12.3 cm. Echogenicity within normal limits. No mass or hydronephrosis visualized.  Abdominal aorta: No aneurysm visualized.  Other findings: None.  IMPRESSION: 1. The common bile duct is upper limits of normal in caliber at 6.6 mm. Status post cholecystectomy. If there is a clinical concern for choledocholithiasis MRCP may be helpful for further assessment. 2. Spleen upper limits of normal in size.  Kerby Moors M.D. 08/09/2018 12:28  The total time spent discussing the results of her laboratory studies, importance of compliance with folic acid replacement, rationale on the use of increased loperamide with recommendations was 25 minutes. At least 50% of that time was spent in face-to-face discussion, counseling, and answering questions.  This note was dictated using voice activated technology/software.  Unfortunately, typographical errors are not uncommon, and transcription is subject to mistakes and regrettably misinterpretation.  If necessary, clarification of the above information can be discussed with me at any time.  FOLLOW UP: AS DIRECTED   cc:Lanae Boast, FNP            Thornton Park MD   Henreitta Leber, MD  Hematology/Oncology [facility] [address] [place],  [address] Office: [phone] OILN: [phone]

## 2018-09-07 ENCOUNTER — Telehealth: Payer: Self-pay | Admitting: Hematology and Oncology

## 2018-09-07 ENCOUNTER — Other Ambulatory Visit: Payer: Self-pay | Admitting: Hematology and Oncology

## 2018-09-07 ENCOUNTER — Telehealth: Payer: Self-pay | Admitting: *Deleted

## 2018-09-07 ENCOUNTER — Other Ambulatory Visit: Payer: Self-pay | Admitting: *Deleted

## 2018-09-07 LAB — HOMOCYSTEINE: Homocysteine: <3 umol/L (ref 0.0–15.0)

## 2018-09-07 LAB — GIARDIA ANTIGEN
MICRO NUMBER:: 91405180
RESULT:: NOT DETECTED
SPECIMEN QUALITY:: ADEQUATE

## 2018-09-07 LAB — FERRITIN: FERRITIN: 44 ng/mL (ref 11–307)

## 2018-09-07 MED ORDER — ONDANSETRON HCL 8 MG PO TABS: 8.0000 mg | ORAL_TABLET | Freq: Two times a day (BID) | ORAL | 0 refills | Status: DC | PRN

## 2018-09-07 NOTE — Telephone Encounter (Signed)
Dr. Audelia Hives spoke with pt today, and discussed low potassium level with pt.  MD had sent in script for K-dur 20 meq daily x 10 days,  And  Zofran 8 mg every 12 hours PRN nausea.  Pt understood md's instructions.

## 2018-09-07 NOTE — Telephone Encounter (Signed)
Spoke to pt regarding appts  °

## 2018-09-10 LAB — CALPROTECTIN, FECAL: CALPROTECTIN, FECAL: 32 ug/g (ref 0–120)

## 2018-09-14 ENCOUNTER — Inpatient Hospital Stay: Payer: Medicaid Other

## 2018-09-14 ENCOUNTER — Other Ambulatory Visit: Payer: Self-pay | Admitting: *Deleted

## 2018-09-14 VITALS — BP 134/81 | HR 61 | Temp 98.2°F | Resp 16

## 2018-09-14 DIAGNOSIS — D696 Thrombocytopenia, unspecified: Secondary | ICD-10-CM

## 2018-09-14 DIAGNOSIS — E538 Deficiency of other specified B group vitamins: Secondary | ICD-10-CM | POA: Diagnosis not present

## 2018-09-14 MED ORDER — FOLIC ACID 1 MG PO TABS: 1.0000 mg | ORAL_TABLET | Freq: Two times a day (BID) | ORAL | 2 refills | Status: DC

## 2018-09-14 MED ORDER — CYANOCOBALAMIN 1000 MCG/ML IJ SOLN
INTRAMUSCULAR | Status: AC
  Filled 2018-09-14: qty 1

## 2018-09-14 MED ORDER — CYANOCOBALAMIN 1000 MCG/ML IJ SOLN
1000.0000 ug | Freq: Once | INTRAMUSCULAR | Status: AC
  Administered 2018-09-14: 1000 ug via INTRAMUSCULAR

## 2018-09-14 NOTE — Patient Instructions (Signed)
Cyanocobalamin, Vitamin B12 injection What is this medicine? CYANOCOBALAMIN (sye an oh koe BAL a min) is a man made form of vitamin B12. Vitamin B12 is used in the growth of healthy blood cells, nerve cells, and proteins in the body. It also helps with the metabolism of fats and carbohydrates. This medicine is used to treat people who can not absorb vitamin B12. This medicine may be used for other purposes; ask your health care provider or pharmacist if you have questions. COMMON BRAND NAME(S): B-12 Compliance Kit, B-12 Injection Kit, Cyomin, LA-12, Nutri-Twelve, Physicians EZ Use B-12, Primabalt What should I tell my health care provider before I take this medicine? They need to know if you have any of these conditions: -kidney disease -Leber's disease -megaloblastic anemia -an unusual or allergic reaction to cyanocobalamin, cobalt, other medicines, foods, dyes, or preservatives -pregnant or trying to get pregnant -breast-feeding How should I use this medicine? This medicine is injected into a muscle or deeply under the skin. It is usually given by a health care professional in a clinic or doctor's office. However, your doctor may teach you how to inject yourself. Follow all instructions. Talk to your pediatrician regarding the use of this medicine in children. Special care may be needed. Overdosage: If you think you have taken too much of this medicine contact a poison control center or emergency room at once. NOTE: This medicine is only for you. Do not share this medicine with others. What if I miss a dose? If you are given your dose at a clinic or doctor's office, call to reschedule your appointment. If you give your own injections and you miss a dose, take it as soon as you can. If it is almost time for your next dose, take only that dose. Do not take double or extra doses. What may interact with this medicine? -colchicine -heavy alcohol intake This list may not describe all possible  interactions. Give your health care provider a list of all the medicines, herbs, non-prescription drugs, or dietary supplements you use. Also tell them if you smoke, drink alcohol, or use illegal drugs. Some items may interact with your medicine. What should I watch for while using this medicine? Visit your doctor or health care professional regularly. You may need blood work done while you are taking this medicine. You may need to follow a special diet. Talk to your doctor. Limit your alcohol intake and avoid smoking to get the best benefit. What side effects may I notice from receiving this medicine? Side effects that you should report to your doctor or health care professional as soon as possible: -allergic reactions like skin rash, itching or hives, swelling of the face, lips, or tongue -blue tint to skin -chest tightness, pain -difficulty breathing, wheezing -dizziness -red, swollen painful area on the leg Side effects that usually do not require medical attention (report to your doctor or health care professional if they continue or are bothersome): -diarrhea -headache This list may not describe all possible side effects. Call your doctor for medical advice about side effects. You may report side effects to FDA at 1-800-FDA-1088. Where should I keep my medicine? Keep out of the reach of children. Store at room temperature between 15 and 30 degrees C (59 and 85 degrees F). Protect from light. Throw away any unused medicine after the expiration date. NOTE: This sheet is a summary. It may not cover all possible information. If you have questions about this medicine, talk to your doctor, pharmacist, or   health care provider.  2018 Elsevier/Gold Standard (2008-01-14 22:10:20)  

## 2018-09-18 ENCOUNTER — Telehealth: Payer: Self-pay | Admitting: *Deleted

## 2018-09-18 NOTE — Telephone Encounter (Signed)
Patient called regarding her prescription for folic acid 1 mg- informed patient medication was e-scribed 09/14/18. Patient verbalized understanding.

## 2018-09-20 ENCOUNTER — Ambulatory Visit (INDEPENDENT_AMBULATORY_CARE_PROVIDER_SITE_OTHER): Payer: Medicaid Other | Admitting: Family Medicine

## 2018-09-20 VITALS — BP 111/70 | HR 66 | Temp 98.9°F | Resp 16 | Ht 68.0 in | Wt 201.0 lb

## 2018-09-20 DIAGNOSIS — J449 Chronic obstructive pulmonary disease, unspecified: Secondary | ICD-10-CM | POA: Diagnosis not present

## 2018-09-20 DIAGNOSIS — K219 Gastro-esophageal reflux disease without esophagitis: Secondary | ICD-10-CM | POA: Diagnosis not present

## 2018-09-20 DIAGNOSIS — R197 Diarrhea, unspecified: Secondary | ICD-10-CM | POA: Diagnosis not present

## 2018-09-20 DIAGNOSIS — F17200 Nicotine dependence, unspecified, uncomplicated: Secondary | ICD-10-CM | POA: Diagnosis not present

## 2018-09-20 DIAGNOSIS — F339 Major depressive disorder, recurrent, unspecified: Secondary | ICD-10-CM

## 2018-09-20 NOTE — Patient Instructions (Signed)
Coping with Quitting Smoking Quitting smoking is a physical and mental challenge. You will face cravings, withdrawal symptoms, and temptation. Before quitting, work with your health care provider to make a plan that can help you cope. Preparation can help you quit and keep you from giving in. How can I cope with cravings? Cravings usually last for 5-10 minutes. If you get through it, the craving will pass. Consider taking the following actions to help you cope with cravings:  Keep your mouth busy: ? Chew sugar-free gum. ? Suck on hard candies or a straw. ? Brush your teeth.  Keep your hands and body busy: ? Immediately change to a different activity when you feel a craving. ? Squeeze or play with a ball. ? Do an activity or a hobby, like making bead jewelry, practicing needlepoint, or working with wood. ? Mix up your normal routine. ? Take a short exercise break. Go for a quick walk or run up and down stairs. ? Spend time in public places where smoking is not allowed.  Focus on doing something kind or helpful for someone else.  Call a friend or family member to talk during a craving.  Join a support group.  Call a quit line, such as 1-800-QUIT-NOW.  Talk with your health care provider about medicines that might help you cope with cravings and make quitting easier for you.  How can I deal with withdrawal symptoms? Your body may experience negative effects as it tries to get used to not having nicotine in the system. These effects are called withdrawal symptoms. They may include:  Feeling hungrier than normal.  Trouble concentrating.  Irritability.  Trouble sleeping.  Feeling depressed.  Restlessness and agitation.  Craving a cigarette.  To manage withdrawal symptoms:  Avoid places, people, and activities that trigger your cravings.  Remember why you want to quit.  Get plenty of sleep.  Avoid coffee and other caffeinated drinks. These may worsen some of your  symptoms.  How can I handle social situations? Social situations can be difficult when you are quitting smoking, especially in the first few weeks. To manage this, you can:  Avoid parties, bars, and other social situations where people might be smoking.  Avoid alcohol.  Leave right away if you have the urge to smoke.  Explain to your family and friends that you are quitting smoking. Ask for understanding and support.  Plan activities with friends or family where smoking is not an option.  What are some ways I can cope with stress? Wanting to smoke may cause stress, and stress can make you want to smoke. Find ways to manage your stress. Relaxation techniques can help. For example:  Breathe slowly and deeply, in through your nose and out through your mouth.  Listen to soothing, relaxing music.  Talk with a family member or friend about your stress.  Light a candle.  Soak in a bath or take a shower.  Think about a peaceful place.  What are some ways I can prevent weight gain? Be aware that many people gain weight after they quit smoking. However, not everyone does. To keep from gaining weight, have a plan in place before you quit and stick to the plan after you quit. Your plan should include:  Having healthy snacks. When you have a craving, it may help to: ? Eat plain popcorn, crunchy carrots, celery, or other cut vegetables. ? Chew sugar-free gum.  Changing how you eat: ? Eat small portion sizes at meals. ?   Eat 4-6 small meals throughout the day instead of 1-2 large meals a day. ? Be mindful when you eat. Do not watch television or do other things that might distract you as you eat.  Exercising regularly: ? Make time to exercise each day. If you do not have time for a long workout, do short bouts of exercise for 5-10 minutes several times a day. ? Do some form of strengthening exercise, like weight lifting, and some form of aerobic exercise, like running or  swimming.  Drinking plenty of water or other low-calorie or no-calorie drinks. Drink 6-8 glasses of water daily, or as much as instructed by your health care provider.  Summary  Quitting smoking is a physical and mental challenge. You will face cravings, withdrawal symptoms, and temptation to smoke again. Preparation can help you as you go through these challenges.  You can cope with cravings by keeping your mouth busy (such as by chewing gum), keeping your body and hands busy, and making calls to family, friends, or a helpline for people who want to quit smoking.  You can cope with withdrawal symptoms by avoiding places where people smoke, avoiding drinks with caffeine, and getting plenty of rest.  Ask your health care provider about the different ways to prevent weight gain, avoid stress, and handle social situations. This information is not intended to replace advice given to you by your health care provider. Make sure you discuss any questions you have with your health care provider. Document Released: 09/30/2016 Document Revised: 09/30/2016 Document Reviewed: 09/30/2016 Elsevier Interactive Patient Education  2018 [employer]  

## 2018-09-20 NOTE — Progress Notes (Signed)
Established Patient Office Visit  Subjective:  Patient ID: Barbara Stanley, female    DOB: August 31, 1971  Age: 47 y.o. MRN: [id number]  CC:  Chief Complaint  Patient presents with  . Follow-up    HPI Barbara Stanley presents for chronic conditions follow up appt. patient initially was seen by this provider on July 27, 2018.  She was found to have hypokalemia.  She reported to the emergency department on October 17 she was given intravenous and oral potassium.  She also reports having multiple loose stools daily.  States that this occurred prior to her gastric bypass.  Patient referred to hematology and gastroenterology.  She has been receiving B12 infusions, iron infusions and is expected to have an EGD and colonoscopy once her lab levels improve.  She reports being compliant with all medications.  No specific problems or concerns today Past Medical History:  Diagnosis Stanley  . Anemia   . COPD (chronic obstructive pulmonary disease) ([place])   . Depression   . Emphysema   . GERD (gastroesophageal reflux disease)   . Tobacco abuse     Past Surgical History:  Procedure Laterality Stanley  . ABDOMINAL HYSTERECTOMY    . c section x 3     . CHOLECYSTECTOMY    . GASTRIC BYPASS      Family History  Problem Relation Age of Onset  . Diabetes Mother   . Diabetes Other   . Hypertension Other   . CAD Other     Social History   Socioeconomic History  . Marital status: Single    Spouse name: Not on file  . Number of children: Not on file  . Years of education: Not on file  . Highest education level: Not on file  Occupational History  . Not on file  Social Needs  . Financial resource strain: Not on file  . Food insecurity:    Worry: Not on file    Inability: Not on file  . Transportation needs:    Medical: Not on file    Non-medical: Not on file  Tobacco Use  . Smoking status: Current Every Day Smoker    Packs/day: 1.00    Years: 1.00    Pack years: 1.00    Types: Cigarettes   . Smokeless tobacco: Never Used  Substance and Sexual Activity  . Alcohol use: No    Comment: occas  . Drug use: No  . Sexual activity: Yes    Birth control/protection: None  Lifestyle  . Physical activity:    Days per week: Not on file    Minutes per session: Not on file  . Stress: Not on file  Relationships  . Social connections:    Talks on phone: Not on file    Gets together: Not on file    Attends religious service: Not on file    Active member of club or organization: Not on file    Attends meetings of clubs or organizations: Not on file    Relationship status: Not on file  . Intimate partner violence:    Fear of current or ex partner: Not on file    Emotionally abused: Not on file    Physically abused: Not on file    Forced sexual activity: Not on file  Other Topics Concern  . Not on file  Social History Narrative  . Not on file    Outpatient Medications Prior to Visit  Medication Sig Dispense Refill  . acetaminophen (TYLENOL) 500 MG tablet Take  1,000 mg by mouth daily as needed for moderate pain.    [name] albuterol (PROAIR HFA) 108 (90 Base) MCG/ACT inhaler Inhale 2 puffs into the lungs every 6 (six) hours as needed for wheezing or shortness of breath. 1 each 5  . budesonide-formoterol (SYMBICORT) 160-4.5 MCG/ACT inhaler Inhale 2 puffs into the lungs 2 (two) times daily. 1 Inhaler 12  . esomeprazole (NEXIUM) 40 MG capsule TAKE 1 CAPSULE BY MOUTH EVERY DAY 90 capsule 0  . folic acid (FOLVITE) 1 MG tablet Take 1 tablet (1 mg total) by mouth 2 (two) times daily with a meal. 60 tablet 2  . loperamide (IMODIUM) 2 MG capsule Take 1 capsule (2 mg total) by mouth 4 (four) times daily as needed for diarrhea or loose stools. 30 capsule 0  . ondansetron (ZOFRAN) 8 MG tablet Take 1 tablet (8 mg total) by mouth every 12 (twelve) hours as needed for nausea or vomiting. 20 tablet 0  . tiotropium (SPIRIVA) 18 MCG inhalation capsule Place 1 capsule (18 mcg total) daily into inhaler and  inhale. 30 capsule 12  . traZODone (DESYREL) 150 MG tablet Take 1 tablet (150 mg total) by mouth at bedtime. For sleep 30 tablet 0  . venlafaxine XR (EFFEXOR-XR) 75 MG 24 hr capsule Take 1 capsule (75 mg total) by mouth daily with breakfast. For depression 30 capsule 0   No facility-administered medications prior to visit.     Allergies  Allergen Reactions  . Other     "Banana Bag" given to patient at 36 office    ROS Review of Systems  Constitutional: Negative.   HENT: Negative.   Eyes: Negative.   Respiratory: Positive for cough.   Cardiovascular: Negative.   Gastrointestinal: Positive for diarrhea.  Endocrine: Negative.   Genitourinary: Negative.   Musculoskeletal: Negative.   Skin: Negative.   Allergic/Immunologic: Negative.   Neurological: Negative.   Hematological: Negative.   Psychiatric/Behavioral: Negative.       Objective:    Physical Exam  Constitutional: She is oriented to person, place, and time. She appears well-developed and well-nourished. No distress.  HENT:  Head: Normocephalic and atraumatic.  Eyes: Pupils are equal, round, and reactive to light. Conjunctivae and EOM are normal.  Neck: Normal range of motion.  Cardiovascular: Normal rate, regular rhythm and normal heart sounds.  Pulmonary/Chest: Effort normal and breath sounds normal. No respiratory distress.  Musculoskeletal: Normal range of motion.  Neurological: She is alert and oriented to person, place, and time.  Skin: Skin is warm and dry.  Psychiatric: She has a normal mood and affect. Her behavior is normal. Judgment and thought content normal.  Nursing note and vitals reviewed.   BP 111/70 (BP Location: Right Arm, Patient Position: Sitting, Cuff Size: Normal)   Pulse 66   Temp 98.9 F (37.2 C) (Oral)   Resp 16   Ht 5\' 8"  (1.727 m)   Wt 201 lb (91.2 kg)   SpO2 97%   BMI 30.56 kg/m  Wt Readings from Last 3 Encounters:  09/20/18 201 lb (91.2 kg)  09/06/18 205 lb 9.6 oz  (93.3 kg)  09/05/18 208 lb (94.3 kg)     There are no preventive care reminders to display for this patient.  There are no preventive care reminders to display for this patient.  Lab Results  Component Value Stanley   TSH 0.74 09/05/2018   Lab Results  Component Value Stanley   WBC 3.1 (L) 09/06/2018   HGB 11.8 (L) 09/06/2018   HCT  37.7 09/06/2018   MCV 95.0 09/06/2018   PLT 33 (L) 09/06/2018   Lab Results  Component Value Stanley   NA 142 09/06/2018   K 3.3 (L) 09/06/2018   CO2 28 09/06/2018   GLUCOSE 81 09/06/2018   BUN 9 09/06/2018   CREATININE 0.69 09/06/2018   BILITOT 1.3 (H) 09/06/2018   ALKPHOS 83 09/06/2018   AST 16 09/06/2018   ALT 14 09/06/2018   PROT 5.2 (L) 09/06/2018   ALBUMIN 3.1 (L) 09/06/2018   CALCIUM 8.4 (L) 09/06/2018   ANIONGAP 7 09/06/2018   GFR 88.45 09/30/2013   Lab Results  Component Value Stanley   CHOL 156 11/06/2014   Lab Results  Component Value Stanley   HDL 44 11/06/2014   Lab Results  Component Value Stanley   LDLCALC 94 11/06/2014   Lab Results  Component Value Stanley   TRIG 89 11/06/2014   Lab Results  Component Value Stanley   CHOLHDL 3.5 11/06/2014   Lab Results  Component Value Stanley   HGBA1C 4.7 08/07/2017      Assessment & Plan:   Problem List Items Addressed This Visit      Respiratory   COPD (chronic obstructive pulmonary disease) ([place]) - Primary     Digestive   GERD (gastroesophageal reflux disease)     Other   Tobacco use disorder   Major depression, recurrent ([place])   Diarrhea      No orders of the defined types were placed in this encounter.   Follow-up: Return in about 3 months (around 12/20/2018).   No medication changes warranted at the present time. Follow up appointments with specialists already scheduled. Patient not interested in smoking cessation at the present time. Encouraged cutting back. Hand outs provided.   Lanae Boast, FNP

## 2018-09-20 NOTE — Progress Notes (Signed)
j

## 2018-09-21 ENCOUNTER — Encounter: Payer: Self-pay | Admitting: Hematology and Oncology

## 2018-09-21 ENCOUNTER — Telehealth: Payer: Self-pay | Admitting: Hematology and Oncology

## 2018-09-21 ENCOUNTER — Other Ambulatory Visit: Payer: Self-pay | Admitting: Family Medicine

## 2018-09-21 ENCOUNTER — Inpatient Hospital Stay (HOSPITAL_BASED_OUTPATIENT_CLINIC_OR_DEPARTMENT_OTHER): Payer: Medicaid Other | Admitting: Hematology and Oncology

## 2018-09-21 ENCOUNTER — Inpatient Hospital Stay: Payer: Medicaid Other | Attending: Hematology and Oncology

## 2018-09-21 ENCOUNTER — Telehealth: Payer: Self-pay

## 2018-09-21 VITALS — BP 107/59 | HR 61 | Temp 98.1°F | Resp 19 | Ht 68.0 in | Wt 200.2 lb

## 2018-09-21 DIAGNOSIS — R42 Dizziness and giddiness: Secondary | ICD-10-CM

## 2018-09-21 DIAGNOSIS — R51 Headache: Secondary | ICD-10-CM | POA: Insufficient documentation

## 2018-09-21 DIAGNOSIS — K219 Gastro-esophageal reflux disease without esophagitis: Secondary | ICD-10-CM

## 2018-09-21 DIAGNOSIS — D61818 Other pancytopenia: Secondary | ICD-10-CM | POA: Diagnosis present

## 2018-09-21 DIAGNOSIS — J449 Chronic obstructive pulmonary disease, unspecified: Secondary | ICD-10-CM | POA: Insufficient documentation

## 2018-09-21 DIAGNOSIS — M19012 Primary osteoarthritis, left shoulder: Secondary | ICD-10-CM | POA: Insufficient documentation

## 2018-09-21 DIAGNOSIS — D696 Thrombocytopenia, unspecified: Secondary | ICD-10-CM

## 2018-09-21 DIAGNOSIS — K529 Noninfective gastroenteritis and colitis, unspecified: Secondary | ICD-10-CM

## 2018-09-21 DIAGNOSIS — Z9884 Bariatric surgery status: Secondary | ICD-10-CM

## 2018-09-21 DIAGNOSIS — R2689 Other abnormalities of gait and mobility: Secondary | ICD-10-CM | POA: Insufficient documentation

## 2018-09-21 DIAGNOSIS — Z79899 Other long term (current) drug therapy: Secondary | ICD-10-CM

## 2018-09-21 DIAGNOSIS — Z9049 Acquired absence of other specified parts of digestive tract: Secondary | ICD-10-CM

## 2018-09-21 DIAGNOSIS — E639 Nutritional deficiency, unspecified: Secondary | ICD-10-CM

## 2018-09-21 DIAGNOSIS — F329 Major depressive disorder, single episode, unspecified: Secondary | ICD-10-CM

## 2018-09-21 DIAGNOSIS — D649 Anemia, unspecified: Secondary | ICD-10-CM

## 2018-09-21 DIAGNOSIS — F17210 Nicotine dependence, cigarettes, uncomplicated: Secondary | ICD-10-CM | POA: Diagnosis not present

## 2018-09-21 DIAGNOSIS — E610 Copper deficiency: Secondary | ICD-10-CM | POA: Diagnosis not present

## 2018-09-21 DIAGNOSIS — E538 Deficiency of other specified B group vitamins: Secondary | ICD-10-CM

## 2018-09-21 DIAGNOSIS — D72819 Decreased white blood cell count, unspecified: Secondary | ICD-10-CM

## 2018-09-21 DIAGNOSIS — D509 Iron deficiency anemia, unspecified: Secondary | ICD-10-CM

## 2018-09-21 LAB — CBC WITH DIFFERENTIAL (CANCER CENTER ONLY)
ABS IMMATURE GRANULOCYTES: 0.01 10*3/uL (ref 0.00–0.07)
Basophils Absolute: 0 10*3/uL (ref 0.0–0.1)
Basophils Relative: 1 %
EOS ABS: 0 10*3/uL (ref 0.0–0.5)
EOS PCT: 1 %
HCT: 37.5 % (ref 36.0–46.0)
Hemoglobin: 11.7 g/dL — ABNORMAL LOW (ref 12.0–15.0)
Immature Granulocytes: 0 %
Lymphocytes Relative: 26 %
Lymphs Abs: 0.7 10*3/uL (ref 0.7–4.0)
MCH: 30 pg (ref 26.0–34.0)
MCHC: 31.2 g/dL (ref 30.0–36.0)
MCV: 96.2 fL (ref 80.0–100.0)
MONO ABS: 0.2 10*3/uL (ref 0.1–1.0)
MONOS PCT: 6 %
Neutro Abs: 1.8 10*3/uL (ref 1.7–7.7)
Neutrophils Relative %: 66 %
PLATELETS: 15 10*3/uL — AB (ref 150–400)
RBC: 3.9 MIL/uL (ref 3.87–5.11)
RDW: 17 % — AB (ref 11.5–15.5)
WBC: 2.7 10*3/uL — AB (ref 4.0–10.5)
nRBC: 0 % (ref 0.0–0.2)

## 2018-09-21 LAB — BASIC METABOLIC PANEL - CANCER CENTER ONLY
Anion gap: 9 (ref 5–15)
BUN: 7 mg/dL (ref 6–20)
CALCIUM: 8.6 mg/dL — AB (ref 8.9–10.3)
CO2: 25 mmol/L (ref 22–32)
Chloride: 108 mmol/L (ref 98–111)
Creatinine: 0.7 mg/dL (ref 0.44–1.00)
GFR, Est AFR Am: >60 mL/min (ref >60)
GLUCOSE: 74 mg/dL (ref 70–99)
POTASSIUM: 3.9 mmol/L (ref 3.5–5.1)
SODIUM: 142 mmol/L (ref 135–145)

## 2018-09-21 LAB — PHOSPHORUS: Phosphorus: 3.3 mg/dL (ref 2.5–4.6)

## 2018-09-21 LAB — FERRITIN: FERRITIN: 26 ng/mL (ref 11–307)

## 2018-09-21 LAB — MAGNESIUM: MAGNESIUM: 1.8 mg/dL (ref 1.7–2.4)

## 2018-09-21 NOTE — Patient Instructions (Signed)
We discussed in detail the results of your lab work from today.  Your white blood cell count and platelets have decreased.  Your platelet count is 15,000.  Avoid any high risk behavior in which he may fall or hit your head.  Copies of your lab work were given for review.  Continue folic acid as previously recommended.  Arrangements are underway to schedule a bone marrow aspiration and biopsy as soon as possible.  A follow-up visit has been scheduled already on December 13 with lab work before.  Please do not hesitate to call in the interim should any new or untoward problems arise.

## 2018-09-21 NOTE — Progress Notes (Signed)
Hematology/Oncology Outpatient Progress Note  Patient Name:  Barbara Stanley  DOB: 07/27/1971   Date of Service: September 21, 2018  Referring Provider: Lanae Boast, [place], [place] [address]  Consulting Physician: Henreitta Leber, MD Hematology/Oncology  Reason for Visit: In the setting of pancytopenia and multiple documented nutritional deficiencies; status post gastric bypass; chronic diarrhea now improved; ongoing aggressive replacement of parenteral iron and vitamin L46 along with folic acid, she presents now for repeat laboratory studies and reevaluation.  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 Thornton Park, [facility] Will need repeat infusional iron  GERD Advised to continue omeprazole as previously recommended EGD with random/duodenal biopsies anticipated  Chronic diarrhea (3-4 loose stools daily) Imodium AD: 4-6 tablets daily Status post consultation with Dr. Thornton Park, gastroenterology (See Dr. Tarri Glenn consultation for details) Anticipate upper and lower endoscopic evaluation once platelets are > 50,000 May benefit from platelet transfusion prior to procedure Giardia antigen and fecal calprotectin: Not detected/nl fecal calprotectin  Electrolyte imbalance Persistent hypokalemia corrected with KCl Normal magnesium and phosphorus Poor nutrition and/or absorption  Chronic obstructive pulmonary disease Active tobacco dependence She was advised regarding smoking cessation Both pharmacologic and behavioral intervention recommended through primary care Budesonide-formoterol (SYMBICORT) 160-4.5 MCG/ACT inhaler Tiotropium (SPIRIVA) 18 MCG inhalation capsule Albuterol (PROAIR HFA) 108 (90 Base) MCG/ACT  inhaler  Degenerative joint disease involving the left shoulder, lower back, and knees Acetaminophen: 500 mg every 6 hours as needed  Unilateral right hearing deficit  Chronic depression Stephannie Peters, FNP neuropsychiatry Venlafaxine XR (EFFEXOR-XR) 75 MG Trazodone (DESYREL) 150 MG tablet  Recommendation/Plan: Next vitamin B12 due on December 27 ([facility]) She was advised to continue folic acid: 1 mg twice daily She was instructed to increase Imodium AD: 2 caplets after each loose/watery stool as needed (maximum 8 tablets daily) On November 29: vitamin B12: 1000 mcg IM (4/4) was given in the [place]. On December 6: Repeat labs from today detailed below                              Hgb 11.7 HCT 37.5 WBC 2.7 platelets 15,000                              Ferritin 26 Although improved, cytopenias persist after aggressive replacement of iron, vitamin T46 and folic acid A bone marrow aspiration and biopsy was requested for morphologic evaluation, immunophenotypic flow cytometry, conventional cytogenetics, and fluorescent in situ hybridization to exclude MDS.  Consider repeating ferumoxytol due to decreasing serum ferritin On December 13: Labs and new provider reevaluation  Brief History: On August 08, 2018 she presented to the emergency department at [facility]  due tohypokalemia and weakness. Her serum potassium was 2.9. She complained also of diarrhea extending over 3-week period. She had been to the emergency department on October 17 where she was given intravenous and oral potassium. She just recently completed a course of moxifloxacin for possible pneumonia. She denied any recent travel outside the country. She has never had Clostritiumdifficile colitis. She has no history of inflammatory bowel disease, viral hepatitis, or diverticulosis. She is not a vegetarian.  Throughout her hospital stay, she was given intravenous fluid resuscitation and  electrolyte replacement. Laboratory studies  performed suggested severe pancytopenia and vitamin E45 and folic acid deficiency. She was given vitamin B12: 1000 mcg IM daily for 3 days. Folic acid: 1 mg once daily was supplemented.When she steadily improved, she was discharged on October 27. A prescription for esomeprazole: 40 mg once daily was written. She has never had a screening colonoscopy. Although home health was to arrange vitamin B12 injections at home, her insurance would not provide coverage. Due to financial constrictions, they have been unable to obtain folic acid until 2 days ago. She was started on folic acid: 1 mg twice daily.  She has had gastroesophageal reflux disease over a lengthy but indefinite period. She states that she has had bowel problems since her teenage years.  When seen initially, she stated that she moved her bowels 5-6 times daily but extremely loose. Over the past several weeks prior to her initial visit, however she had had bowel movements every hour (20 BMs daily) during the acute period. While hospitalized she was given loperamide. Upon discharge, she moved her bowels 10 times daily, described as semi-formed almost watery. Since her last visit, she now has 2-3 stools daily which are semi-formed/loose. She now takes Imodium: 4-6 times daily. In the distant past, she was on iron supplementation. She vaguely remembers that vitamin W09 and folic acid were recommended following her gastric bypass. She reports no melena or bright red blood per rectum.  At the time of her initial visit on October 30, arrangements were made to have vitamin B12: 1000 mcg IM given daily in the [place] for 3 consecutive days.  She previously received 3 days of vitamin B12 while hospitalized. It was recommended that they obtain folic acid: 1 mg once twice daily. The results of the C. difficile quick screen with PCR reflex was negative from October 31.  Qualitative fecal lactoferrin was  positive.  A stool culture for E. coli; Shigella toxin; Salmonella and Shigella were not identified. Campylobacter culture was negative. Empirically she was given a brief course of metronidazole.  This was discontinued after 6 days. Because her serum ferritin was low, she received  ferumoxytol: 510 mg IVPB on November 7 along with continued parenteral vitamin B12.  In addition she continues on folic acid: 1 mg twice daily.    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 She presents today for lab work and reevaluation.  Interval History: In the interim since her last visit, her overall energy level has improved. Likewise her appetite has improved while her weight remains stable.  She continues to experience nausea sometimes 3 times weekly.  The ondansetron is effective.  She denies any vomiting. She states her energy level is unchanged.  Her diarrhea has improved but she continues to have 2-3 "runny" stools daily.  She has been using loperamide: 4-6 tablets daily. She denies any bright red blood per rectum.  She has occasional headaches without syncope, near syncopal episodes. She does have positional dizziness. There is no rash or itching. She has no cough, sore throat, orthopnea. She denies dyspnea either at rest or on minimal exertion. She denies pain or difficulty in swallowing. No fever, shaking chills, sweats, or flulike symptoms are reported. She has dyspepsia which has improved significantly since starting esomeprazole. She denies vomiting or constipation. She has episodic nausea. No urinary  frequency, urgency, hematuria, dysuria are reported. She has no significant swelling of her ankles. There are no new arthralgias or  myalgias. She denies bleeding tendency or easy bruisability.The intermittent tingling of her left hand and feet bilaterally have resolved.  Past Medical History Reviewed        Family History Reviewed       Social History Reviewed  Allergies  Allergen Reactions  . Other     "Banana Bag" given to patient at 69 office   Current Outpatient Medications on File Prior to Visit  Medication Sig  . acetaminophen (TYLENOL) 500 MG tablet Take 1,000 mg by mouth daily as needed for moderate pain.  [name] albuterol (PROAIR HFA) 108 (90 Base) MCG/ACT inhaler Inhale 2 puffs into the lungs every 6 (six) hours as needed for wheezing or shortness of breath.  . budesonide-formoterol (SYMBICORT) 160-4.5 MCG/ACT inhaler Inhale 2 puffs into the lungs 2 (two) times daily.  [name] esomeprazole (NEXIUM) 40 MG capsule TAKE 1 CAPSULE BY MOUTH EVERY DAY  . folic acid (FOLVITE) 1 MG tablet Take 1 tablet (1 mg total) by mouth 2 (two) times daily with a meal.  . loperamide (IMODIUM) 2 MG capsule Take 1 capsule (2 mg total) by mouth 4 (four) times daily as needed for diarrhea or loose stools.  . ondansetron (ZOFRAN) 8 MG tablet Take 1 tablet (8 mg total) by mouth every 12 (twelve) hours as needed for nausea or vomiting.  . tiotropium (SPIRIVA) 18 MCG inhalation capsule Place 1 capsule (18 mcg total) daily into inhaler and inhale.  . traZODone (DESYREL) 150 MG tablet Take 1 tablet (150 mg total) by mouth at bedtime. For sleep  . venlafaxine XR (EFFEXOR-XR) 75 MG 24 hr capsule Take 1 capsule (75 mg total) by mouth daily with breakfast. For depression   No current facility-administered medications on file prior to visit.     Review of Systems: Constitutional: No fever, sweats, or shaking chills.  Improved appetite with weight. Skin: No rash, scaling, sores, lumps, or jaundice; pallor. HEENT: No visual changes;unilateral right hearing deficit. Pulmonary: No unusual cough, sore throat, or orthopnea;active tobacco  dependence/COPD. Breasts: No complaints;she is scheduled for mammogram. Cardiovascular: No coronary artery disease, angina, or myocardial infarction. No cardiac dysrhythmia, essential hypertension, or dyslipidemia. Gastrointestinal: No indigestion, dysphagia, abdominal pain,or constipation.Chronic diarrhea loose/semi-formed/watery, numbering 2-3 daily without abdominal cramps; no tenesmus; GERD. She denies melena or bright red blood per rectum.  Genitourinary: Nourinaryfrequency, urgency, hematuria, or dysuria. Musculoskeletal:DJD: Left shoulder, lower back, and knees bilaterally. Nonewarthralgias or myalgias; no joint swelling, pain, or instability. Hematologic: No bleeding tendency or easy bruisability; persistent pancytopenia. Endocrine: No intolerance to hot or cold; no thyroid disease or diabetes mellitus. Vascular: No peripheral arterial or venous thromboembolic disease. Psychological:Majordepression; no mood changes. Neurological: Positional dizziness. Tardive dyskinesia: facial twitching and tongue rolling; no syncope or near syncopal episodes; no numbness or tingling in the fingers or toes.  Physical Examination: Vital Signs: Body surface area is 2.09 meters squared.  Vitals:   09/21/18 1036  BP: (!) 107/59  Pulse: 61  Resp: 19  Temp: 98.1 F (36.7 C)  SpO2: 100%    Filed Weights   09/21/18 1036  Weight: 200 lb 3.2 oz (90.8 kg)  Body mass index is 30.44 kg/m. Constitutional:Barbara Robinsonis fully nourished and developed. She looks older than her stated age.She is friendly and cooperative without respiratory compromise at rest. Skin: No rashes, scaling, jaundice, or itching.  Dry skin and pallor. HEENT: Head is normocephalic and atraumatic.  Pupils are equal round and reactive to light and accommodation. Sclerae are anicteric. Conjunctivae are pale. No sinus tenderness nor oropharyngeal lesions. Lips without cracking or peeling; tongue without mass,  inflammation, or nodularity. Mucous membranes are moist. Neck: Supple and symmetric. No jugular venous distention or thyromegaly. Trachea is midline. Lymphatics: No cervical or supraclavicular lymphadenopathy. No epitrochlear, axillary, or inguinal lymphadenopathy is appreciated. Respiratory/chest: Thorax is symmetrical. Breath sounds are clear to auscultation and percussion. Normal excursion and respiratory effort. Back: Symmetric without deformity or tenderness. Cardiovascular: Heart rate and rhythm are regular without murmurs. Gastrointestinal: Abdomen is soft, nontender; no organomegaly. Bowel sounds are normoactive. No masses are appreciated. Extremities: In the lower extremities, there is no asymmetric swelling, erythema, tenderness, or cord formation. No clubbingorcyanosis.Her ankles are thick with trace nonpitting edema. Hematologic: No petechiae, hematomas, or ecchymoses. Psychological: She is oriented to person, place, and time; normal affect;memoryand cognitive deficit.  Neurological:There are no gross sensory or motor deficits.  Laboratory Results: September 21, 2018  Ref Range & Units 1d ago 2wk ago 3wk ago 72moago  WBC Count 4.0 - 10.5 K/uL 2.7Low   3.1Low   3.6Low   2.3Low    RBC 3.87 - 5.11 MIL/uL 3.90  3.97  3.95  3.98   Hemoglobin 12.0 - 15.0 g/dL 11.7Low   11.8Low   11.6Low   11.3Low    HCT 36.0 - 46.0 % 37.5  37.7  36.7  37.5   MCV 80.0 - 100.0 fL 96.2  95.0  92.9  94.2   MCH 26.0 - 34.0 pg 30.0  29.7  29.4  28.4   MCHC 30.0 - 36.0 g/dL 31.2  31.3  31.6  30.1   RDW 11.5 - 15.5 % 17.0High   18.9High   20.0High   21.3High    Platelet Count 150 - 400 K/uL 15Low   33Low  CM 18Low  CM 25Low  CM  Comment: Immature Platelet Fraction may be  clinically indicated, consider  ordering this additional test  [id number]  nRBC 0.0 - 0.2 % 0.0  0.0  0.0  0.0   Neutrophils Relative % % 66  65  65  65   Neutro Abs 1.7 - 7.7 K/uL 1.8  2.1  2.4  1.5Low    Lymphocytes  Relative % '26  26  26  27   ' Lymphs Abs 0.7 - 4.0 K/uL 0.7  0.8  0.9  0.6Low    Monocytes Relative % '6  6  6  7   ' Monocytes Absolute 0.1 - 1.0 K/uL 0.2  0.2  0.2  0.2   Eosinophils Relative % '1  1  1  ' 0   Eosinophils Absolute 0.0 - 0.5 K/uL 0.0  0.0  0.0  0.0   Basophils Absolute 0.0 - 0.1 K/uL 0.0  0.0  0.0  0.0   Immature Granulocytes % 0  1  1  0   Abs Immature Granulocytes 0.00 - 0.07 K/uL 0.01  0.02 CM 0.02 CM 0.01 CM    Ref Range & Units 1d ago     Sodium 135 - 145 mmol/L 142      Potassium 3.5 - 5.1 mmol/L 3.9      Chloride 98 - 111 mmol/L 108      CO2 22 - 32 mmol/L 25      Glucose, Bld 70 - 99 mg/dL 74      BUN 6 - 20 mg/dL 7      Creatinine 0.44 - 1.00 mg/dL  0.70      Calcium 8.9 - 10.3 mg/dL 8.6Low       GFR, Est Non Af Am >60 mL/min >60      GFR, Est AFR Am >60 mL/min >60      Anion gap 5 - 15 9      Magnesium 1.8 Phosphorus 3.3 Homocysteine <3.0 Ferritin 26  Ref Range & Units 14:59 (09/06/18) 6d ago (08/31/18) 2wk ago (08/23/18) 3wk ago (08/15/18) 3wk ago (08/12/18)  Sodium 135 - 145 mmol/L 142  141  142  143  146High    Potassium 3.5 - 5.1 mmol/L 3.3Low   3.7  3.8  3.7  4.0   Chloride 98 - 111 mmol/L 107  108  109  108  110   CO2 22 - 32 mmol/L '28  26  27  30  30   ' Glucose, Bld 70 - 99 mg/dL 81  92  81  76  85   BUN 6 - 20 mg/dL '9  12  7  8  8   ' Creatinine, Ser 0.44 - 1.00 mg/dL 0.69  0.66  0.66  0.64  0.59   Calcium 8.9 - 10.3 mg/dL 8.4Low   8.4Low   8.5Low   8.0Low   8.2Low    Total Protein 6.5 - 8.1 g/dL 5.2Low   5.6Low   5.3Low   5.0Low     Albumin 3.5 - 5.0 g/dL 3.1Low   3.3Low   3.1Low   2.8Low     AST 15 - 41 U/L '16  22  25  20    ' ALT 0 - 44 U/L '14  23  23  26    ' Alkaline Phosphatase 38 - 126 U/L 83  89  84  88    Total Bilirubin 0.3 - 1.2 mg/dL 1.3High   0.7  1.0  0.9    GFR calc non Af Amer >60 mL/min >60  >60  >60  >60  >60   GFR calc Af Amer >60 mL/min >60  >60 CM >60 CM >60 CM >60 CM  Comment: (NOTE)   Ferritin: Pending Homocystine:  Pending  August 15, 2018  Ref Range & Units 14:14  Retic Ct Pct 0.4 - 3.1 % 2.6   RBC. 3.87 - 5.11 MIL/uL 3.70Low   Retic Count, Absolute 19.0 - 186.0 K/uL 96.6   Immature Retic Fract 2.3 - 15.9 % 23.4High   Reticulocyte Hemoglobin >27.9 pg 35.2   Comment:  Given the high negative predictive  value of a RET-He result >32 pg  iron deficiency is essentially  excluded. If this patient is anemic  other etiologies should be  considered.   Uric acid 4.8 LDH 932 Folic acid 4.0 Vitamin B12 911  Haptoglobin 72  Ferritin 25  Iron/TIBC 54/383  Iron saturation 14%  Diagnostic/Imaging Studies: August 09, 2018 ABDOMEN ULTRASOUND COMPLETE  COMPARISON:  None.  FINDINGS: Gallbladder: Previous cholecystectomy.  Common bile duct: Diameter: 6.6 mm  Liver: No focal lesion identified. Within normal limits in parenchymal echogenicity. Portal vein is patent on color Doppler imaging with normal direction of blood flow towards the liver.  IVC: No abnormality visualized.  Pancreas: Visualized portion unremarkable.  Spleen: Spleen upper limits of normal in size measuring 12.6 cm in length with a volume of 397 cc.  Right Kidney: Length: 12.3 cm. Echogenicity within normal limits. No mass or hydronephrosis visualized.  Left Kidney: Length: 12.3 cm. Echogenicity within normal limits. No mass or hydronephrosis visualized.  Abdominal aorta: No aneurysm  visualized.  Other findings: None.  IMPRESSION: 1. The common bile duct is upper limits of normal in caliber at 6.6 mm. Status post cholecystectomy. If there is a clinical concern for choledocholithiasis MRCP may be helpful for further assessment. 2. Spleen upper limits of normal in size.  Kerby Moors M.D. 08/09/2018 12:28  This note was dictated using voice activated technology/software.  Unfortunately, typographical errors are not uncommon, and transcription is subject to mistakes and regrettably  misinterpretation.  If necessary, clarification of the above information can be discussed with me at any time.  FOLLOW UP: AS DIRECTED   cc:Lanae Boast, FNP            Thornton Park MD   Henreitta Leber, MD  Hematology/Oncology [facility] [address] [place], [place] [address] Office: [phone] ZDVI: [phone]

## 2018-09-21 NOTE — Telephone Encounter (Signed)
Per 12/06 los, appointment for Dec 13 was already scheduled.

## 2018-09-21 NOTE — Telephone Encounter (Signed)
Message sent to have patient added to schedule for BMBX for 10/01/18 with LC as well as CBC scheduled for after procedure.

## 2018-09-22 DIAGNOSIS — D61818 Other pancytopenia: Secondary | ICD-10-CM | POA: Insufficient documentation

## 2018-09-24 ENCOUNTER — Other Ambulatory Visit: Payer: Self-pay | Admitting: Family Medicine

## 2018-09-24 DIAGNOSIS — Z1231 Encounter for screening mammogram for malignant neoplasm of breast: Secondary | ICD-10-CM

## 2018-09-27 ENCOUNTER — Other Ambulatory Visit: Payer: Self-pay | Admitting: Hematology and Oncology

## 2018-09-27 DIAGNOSIS — E610 Copper deficiency: Secondary | ICD-10-CM | POA: Insufficient documentation

## 2018-09-27 DIAGNOSIS — Z9884 Bariatric surgery status: Secondary | ICD-10-CM

## 2018-09-27 DIAGNOSIS — D61818 Other pancytopenia: Secondary | ICD-10-CM

## 2018-09-27 DIAGNOSIS — D649 Anemia, unspecified: Secondary | ICD-10-CM

## 2018-09-28 ENCOUNTER — Encounter: Payer: Self-pay | Admitting: Hematology and Oncology

## 2018-09-28 ENCOUNTER — Telehealth: Payer: Self-pay | Admitting: Hematology and Oncology

## 2018-09-28 ENCOUNTER — Inpatient Hospital Stay (HOSPITAL_BASED_OUTPATIENT_CLINIC_OR_DEPARTMENT_OTHER): Payer: Medicaid Other | Admitting: Hematology and Oncology

## 2018-09-28 ENCOUNTER — Inpatient Hospital Stay: Payer: Medicaid Other

## 2018-09-28 DIAGNOSIS — Z79899 Other long term (current) drug therapy: Secondary | ICD-10-CM

## 2018-09-28 DIAGNOSIS — K529 Noninfective gastroenteritis and colitis, unspecified: Secondary | ICD-10-CM

## 2018-09-28 DIAGNOSIS — D61818 Other pancytopenia: Secondary | ICD-10-CM

## 2018-09-28 DIAGNOSIS — R2689 Other abnormalities of gait and mobility: Secondary | ICD-10-CM

## 2018-09-28 DIAGNOSIS — E610 Copper deficiency: Secondary | ICD-10-CM

## 2018-09-28 DIAGNOSIS — Z9049 Acquired absence of other specified parts of digestive tract: Secondary | ICD-10-CM

## 2018-09-28 DIAGNOSIS — J449 Chronic obstructive pulmonary disease, unspecified: Secondary | ICD-10-CM

## 2018-09-28 DIAGNOSIS — E538 Deficiency of other specified B group vitamins: Secondary | ICD-10-CM | POA: Diagnosis not present

## 2018-09-28 DIAGNOSIS — F329 Major depressive disorder, single episode, unspecified: Secondary | ICD-10-CM

## 2018-09-28 DIAGNOSIS — F17200 Nicotine dependence, unspecified, uncomplicated: Secondary | ICD-10-CM

## 2018-09-28 DIAGNOSIS — R51 Headache: Secondary | ICD-10-CM

## 2018-09-28 DIAGNOSIS — M19012 Primary osteoarthritis, left shoulder: Secondary | ICD-10-CM

## 2018-09-28 DIAGNOSIS — F17210 Nicotine dependence, cigarettes, uncomplicated: Secondary | ICD-10-CM

## 2018-09-28 DIAGNOSIS — K219 Gastro-esophageal reflux disease without esophagitis: Secondary | ICD-10-CM | POA: Diagnosis not present

## 2018-09-28 DIAGNOSIS — Z9884 Bariatric surgery status: Secondary | ICD-10-CM

## 2018-09-28 DIAGNOSIS — R42 Dizziness and giddiness: Secondary | ICD-10-CM

## 2018-09-28 LAB — CBC WITH DIFFERENTIAL/PLATELET
Abs Immature Granulocytes: 0.01 10*3/uL (ref 0.00–0.07)
Basophils Absolute: 0 10*3/uL (ref 0.0–0.1)
Basophils Relative: 1 %
Eosinophils Absolute: 0 10*3/uL (ref 0.0–0.5)
Eosinophils Relative: 1 %
HEMATOCRIT: 38 % (ref 36.0–46.0)
Hemoglobin: 12.1 g/dL (ref 12.0–15.0)
Immature Granulocytes: 0 %
LYMPHS ABS: 0.5 10*3/uL — AB (ref 0.7–4.0)
Lymphocytes Relative: 13 %
MCH: 30.1 pg (ref 26.0–34.0)
MCHC: 31.8 g/dL (ref 30.0–36.0)
MCV: 94.5 fL (ref 80.0–100.0)
MONO ABS: 0.2 10*3/uL (ref 0.1–1.0)
MONOS PCT: 5 %
Neutro Abs: 2.8 10*3/uL (ref 1.7–7.7)
Neutrophils Relative %: 80 %
Platelets: 17 10*3/uL — ABNORMAL LOW (ref 150–400)
RBC: 4.02 MIL/uL (ref 3.87–5.11)
RDW: 15.9 % — ABNORMAL HIGH (ref 11.5–15.5)
WBC: 3.5 10*3/uL — ABNORMAL LOW (ref 4.0–10.5)
nRBC: 0 % (ref 0.0–0.2)

## 2018-09-28 LAB — LACTATE DEHYDROGENASE: LDH: 166 U/L (ref 98–192)

## 2018-09-28 LAB — DIRECT ANTIGLOBULIN TEST (NOT AT ARMC)
DAT, IgG: NEGATIVE
DAT, complement: NEGATIVE

## 2018-09-28 LAB — SEDIMENTATION RATE: Sed Rate: 6 mm/hr (ref 0–22)

## 2018-09-28 NOTE — Progress Notes (Signed)
[place] progress notes  Patient Care Team: Lanae Boast, FNP as PCP - General (Family Medicine)   ASSESSMENT & PLAN:  Other pancytopenia [facility]) She has extensive evaluation in the past with no definitive cause for her pancytopenia She was placed on high-dose vitamin B12 replacement therapy with improvement of her hemoglobin but no differences are noted on her WBC and platelet count We discussed the risk and benefit of bone marrow aspirate and biopsy and she agreed to proceed I plan to see her back within a week of bone marrow biopsy to review test results Currently, she is not symptomatic with her pancytopenia  Tobacco use disorder I spent some time counseling the patient the importance of tobacco cessation. We discussed common tobacco cessation strategies She is currently not interested to quit now.   S/P gastric bypass She has recent progressive weight loss The patient has history of gastric bypass surgery and multiple mineral deficiencies were suspected She has received high-dose vitamin B12 replacement therapy Serum copper level is pending  All questions were answered. The patient knows to call the clinic with any problems, questions or concerns. I spent 20 minutes counseling the patient face to face. The total time spent in the appointment was 30 minutes and more than 50% was on counseling.     Heath Lark, MD 09/28/2018 4:43 PM  CHIEF COMPLAINTS/PURPOSE OF VISIT:  Significant pancytopenia  HISTORY OF PRESENTING ILLNESS:  Brenn Gatton 47 y.o. female was transferred to my care after her prior physician has left.  I reviewed the patient's records extensive and collaborated the history with the patient. Summary of her history is as follows: The patient is noted to have progressive anemia for the past few years.  Starting around 2018, she was noted to have progressive mild pancytopenia.  Most recently, around October 2019, she is noted to have  significant pancytopenia. She was referred to see hematologist for evaluation.  The patient has history of gastric bypass surgery.  She has significant recent weight loss. Her primary care doctor has ordered multiple mineral panel.  She was noted to have folate, B6, B12, copper as well as iron deficiency.  She has received multiple mineral replacement therapy.  When she was seen here at the hematology clinic, she received high-dose vitamin B12 injection. On August 09, 2018, she underwent ultrasound of her abdomen which showed no evidence of splenomegaly.  The patient has significant smoking history but denies alcohol intake She denies significant recent infection requiring intravenous antibiotic treatment. Infectious work-up was negative.  Since she was last seen here, despite vitamin B12 replacement therapy, she did not feel a whole lot better The patient denies any recent signs or symptoms of bleeding such as spontaneous epistaxis, hematuria or hematochezia. She denies recent infection, fever or chills She has chronic fatigue No abnormal skin rashes.  No joint pain.  She denies over-the-counter supplement  MEDICAL HISTORY:  Past Medical History:  Diagnosis Date  . Anemia   . COPD (chronic obstructive pulmonary disease) ([place])   . Depression   . Emphysema   . GERD (gastroesophageal reflux disease)   . Tobacco abuse     SURGICAL HISTORY: Past Surgical History:  Procedure Laterality Date  . ABDOMINAL HYSTERECTOMY    . c section x 3     . CHOLECYSTECTOMY    . GASTRIC BYPASS      SOCIAL HISTORY: Social History   Socioeconomic History  . Marital status: Single    Spouse name: Not on  file  . Number of children: Not on file  . Years of education: Not on file  . Highest education level: Not on file  Occupational History  . Not on file  Social Needs  . Financial resource strain: Not on file  . Food insecurity:    Worry: Not on file    Inability: Not on file  . Transportation  needs:    Medical: Not on file    Non-medical: Not on file  Tobacco Use  . Smoking status: Current Every Day Smoker    Packs/day: 1.00    Years: 1.00    Pack years: 1.00    Types: Cigarettes  . Smokeless tobacco: Never Used  Substance and Sexual Activity  . Alcohol use: No    Comment: occas  . Drug use: No  . Sexual activity: Yes    Birth control/protection: None  Lifestyle  . Physical activity:    Days per week: Not on file    Minutes per session: Not on file  . Stress: Not on file  Relationships  . Social connections:    Talks on phone: Not on file    Gets together: Not on file    Attends religious service: Not on file    Active member of club or organization: Not on file    Attends meetings of clubs or organizations: Not on file    Relationship status: Not on file  . Intimate partner violence:    Fear of current or ex partner: Not on file    Emotionally abused: Not on file    Physically abused: Not on file    Forced sexual activity: Not on file  Other Topics Concern  . Not on file  Social History Narrative  . Not on file    FAMILY HISTORY: Family History  Problem Relation Age of Onset  . Diabetes Mother   . Diabetes Other   . Hypertension Other   . CAD Other     ALLERGIES:  is allergic to other.  MEDICATIONS:  Current Outpatient Medications  Medication Sig Dispense Refill  . acetaminophen (TYLENOL) 500 MG tablet Take 1,000 mg by mouth daily as needed for moderate pain.    [name] albuterol (PROAIR HFA) 108 (90 Base) MCG/ACT inhaler Inhale 2 puffs into the lungs every 6 (six) hours as needed for wheezing or shortness of breath. 1 each 5  . budesonide-formoterol (SYMBICORT) 160-4.5 MCG/ACT inhaler Inhale 2 puffs into the lungs 2 (two) times daily. 1 Inhaler 12  . esomeprazole (NEXIUM) 40 MG capsule TAKE 1 CAPSULE BY MOUTH EVERY DAY 90 capsule 0  . folic acid (FOLVITE) 1 MG tablet Take 1 tablet (1 mg total) by mouth 2 (two) times daily with a meal. 60 tablet 2  .  loperamide (IMODIUM) 2 MG capsule Take 1 capsule (2 mg total) by mouth 4 (four) times daily as needed for diarrhea or loose stools. 30 capsule 0  . ondansetron (ZOFRAN) 8 MG tablet Take 1 tablet (8 mg total) by mouth every 12 (twelve) hours as needed for nausea or vomiting. 20 tablet 0  . SPIRIVA HANDIHALER 18 MCG inhalation capsule INHALE THE CONTENT OF ONE CAPSULE EVERY DAY 30 capsule 0  . traZODone (DESYREL) 150 MG tablet Take 1 tablet (150 mg total) by mouth at bedtime. For sleep 30 tablet 0  . venlafaxine XR (EFFEXOR-XR) 75 MG 24 hr capsule Take 1 capsule (75 mg total) by mouth daily with breakfast. For depression 30 capsule 0   No current  facility-administered medications for this visit.     REVIEW OF SYSTEMS:   Constitutional: Denies fevers, chills or abnormal night sweats Eyes: Denies blurriness of vision, double vision or watery eyes Ears, nose, mouth, throat, and face: Denies mucositis or sore throat Respiratory: Denies cough, dyspnea or wheezes Cardiovascular: Denies palpitation, chest discomfort or lower extremity swelling Gastrointestinal:  Denies nausea, heartburn or change in bowel habits Skin: Denies abnormal skin rashes Lymphatics: Denies new lymphadenopathy or easy bruising Neurological:Denies numbness, tingling or new weaknesses Behavioral/Psych: Mood is stable, no new changes  All other systems were reviewed with the patient and are negative.  PHYSICAL EXAMINATION: ECOG PERFORMANCE STATUS: 1 - Symptomatic but completely ambulatory  Vitals:   09/28/18 1334  BP: 109/71  Pulse: 71  Resp: 18  Temp: 98.4 F (36.9 C)  SpO2: 95%   Filed Weights   09/28/18 1334  Weight: 198 lb (89.8 kg)    GENERAL:alert, no distress and comfortable SKIN: skin color, texture, turgor are normal, no rashes or significant lesions EYES: normal, conjunctiva are pink and non-injected, sclera clear OROPHARYNX:no exudate, normal lips, buccal mucosa, and tongue  NECK: supple, thyroid  normal size, non-tender, without nodularity LYMPH:  no palpable lymphadenopathy in the cervical, axillary or inguinal LUNGS: clear to auscultation and percussion with normal breathing effort HEART: regular rate & rhythm and no murmurs without lower extremity edema ABDOMEN:abdomen soft, non-tender and normal bowel sounds Musculoskeletal:no cyanosis of digits and no clubbing  PSYCH: alert & oriented x 3 with fluent speech NEURO: no focal motor/sensory deficits  LABORATORY DATA:  I have reviewed the data as listed Lab Results  Component Value Date   WBC 3.5 (L) 09/28/2018   HGB 12.1 09/28/2018   HCT 38.0 09/28/2018   MCV 94.5 09/28/2018   PLT 17 (L) 09/28/2018   Recent Labs    08/23/18 1036 08/31/18 1349 09/06/18 1459 09/21/18 1000  NA 142 141 142 142  K 3.8 3.7 3.3* 3.9  CL 109 108 107 108  CO2 '27 26 28 25  ' GLUCOSE 81 92 81 74  BUN '7 12 9 7  ' CREATININE 0.66 0.66 0.69 0.70  CALCIUM 8.5* 8.4* 8.4* 8.6*  GFRNONAA >60 >60 >60 >60  GFRAA >60 >60 >60 >60  PROT 5.3* 5.6* 5.2*  --   ALBUMIN 3.1* 3.3* 3.1*  --   AST '25 22 16  ' --   ALT '23 23 14  ' --   ALKPHOS 84 89 83  --   BILITOT 1.0 0.7 1.3*  --

## 2018-09-28 NOTE — Assessment & Plan Note (Signed)
She has recent progressive weight loss The patient has history of gastric bypass surgery and multiple mineral deficiencies were suspected She has received high-dose vitamin B12 replacement therapy Serum copper level is pending

## 2018-09-28 NOTE — Assessment & Plan Note (Signed)
She has extensive evaluation in the past with no definitive cause for her pancytopenia She was placed on high-dose vitamin B12 replacement therapy with improvement of her hemoglobin but no differences are noted on her WBC and platelet count We discussed the risk and benefit of bone marrow aspirate and biopsy and she agreed to proceed I plan to see her back within a week of bone marrow biopsy to review test results Currently, she is not symptomatic with her pancytopenia

## 2018-09-28 NOTE — Assessment & Plan Note (Signed)
I spent some time counseling the patient the importance of tobacco cessation. We discussed common tobacco cessation strategies She is currently not interested to quit now.

## 2018-09-28 NOTE — Telephone Encounter (Signed)
Gave patient avs.  Patient did not need calendar.  My chart active.

## 2018-09-29 LAB — COPPER, SERUM: Copper: 53 ug/dL — ABNORMAL LOW (ref 72–166)

## 2018-09-29 LAB — VITAMIN D 25 HYDROXY (VIT D DEFICIENCY, FRACTURES): Vit D, 25-Hydroxy: <4 ng/mL — ABNORMAL LOW (ref 30.0–100.0)

## 2018-10-01 ENCOUNTER — Inpatient Hospital Stay: Payer: Medicaid Other

## 2018-10-01 ENCOUNTER — Inpatient Hospital Stay (HOSPITAL_BASED_OUTPATIENT_CLINIC_OR_DEPARTMENT_OTHER): Payer: Medicaid Other | Admitting: Adult Health

## 2018-10-01 ENCOUNTER — Encounter: Payer: Self-pay | Admitting: Adult Health

## 2018-10-01 VITALS — BP 96/62 | HR 49 | Temp 98.4°F | Resp 18

## 2018-10-01 DIAGNOSIS — D61818 Other pancytopenia: Secondary | ICD-10-CM

## 2018-10-01 DIAGNOSIS — D696 Thrombocytopenia, unspecified: Secondary | ICD-10-CM

## 2018-10-01 LAB — CBC WITH DIFFERENTIAL/PLATELET
Abs Immature Granulocytes: 0.02 10*3/uL (ref 0.00–0.07)
Basophils Absolute: 0 10*3/uL (ref 0.0–0.1)
Basophils Relative: 1 %
Eosinophils Absolute: 0 10*3/uL (ref 0.0–0.5)
Eosinophils Relative: 1 %
HCT: 35.4 % — ABNORMAL LOW (ref 36.0–46.0)
Hemoglobin: 11.2 g/dL — ABNORMAL LOW (ref 12.0–15.0)
Immature Granulocytes: 1 %
Lymphocytes Relative: 18 %
Lymphs Abs: 0.6 10*3/uL — ABNORMAL LOW (ref 0.7–4.0)
MCH: 30.3 pg (ref 26.0–34.0)
MCHC: 31.6 g/dL (ref 30.0–36.0)
MCV: 95.7 fL (ref 80.0–100.0)
Monocytes Absolute: 0.2 10*3/uL (ref 0.1–1.0)
Monocytes Relative: 5 %
NEUTROS ABS: 2.5 10*3/uL (ref 1.7–7.7)
NRBC: 0 % (ref 0.0–0.2)
Neutrophils Relative %: 74 %
Platelets: 24 10*3/uL — ABNORMAL LOW (ref 150–400)
RBC: 3.7 MIL/uL — ABNORMAL LOW (ref 3.87–5.11)
RDW: 15.7 % — ABNORMAL HIGH (ref 11.5–15.5)
WBC: 3.4 10*3/uL — ABNORMAL LOW (ref 4.0–10.5)

## 2018-10-01 LAB — KAPPA/LAMBDA LIGHT CHAINS
KAPPA FREE LGHT CHN: 23.2 mg/L — AB (ref 3.3–19.4)
Kappa, lambda light chain ratio: 1.81 — ABNORMAL HIGH (ref 0.26–1.65)
Lambda free light chains: 12.8 mg/L (ref 5.7–26.3)

## 2018-10-01 LAB — SAMPLE TO BLOOD BANK

## 2018-10-01 MED ORDER — LIDOCAINE HCL 2 % IJ SOLN
INTRAMUSCULAR | Status: AC
  Filled 2018-10-01: qty 20

## 2018-10-01 NOTE — Progress Notes (Signed)
Left iliac crest dressing clean, dry, and intact.  Pt was stable at discharge via ambulatory with husband.

## 2018-10-01 NOTE — Progress Notes (Signed)
Barbara Stanley PROGRESS NOTE  Barbara Stanley presents for Bone Marrow biopsy per MD orders. Barbara Stanley verbalized understanding of procedure. Consent reviewed and signed.  Barbara Stanley positioned supine for procedure. Time-out performed and Bone Marrow Checklist. Procedure began at [place]. Xylocaine 2% 10 cc used for local and administered to patient by Wilber Bihari. Procedure completed at 0829. Patient tolerated well. Pressure dressing applied to Left hip with instructions to leave in place for 24 hours. Patient instructed to report any bleeding that saturates dressing and to take pain medication  as directed. Dressing dry and intact to left hip on discharge.

## 2018-10-01 NOTE — Progress Notes (Signed)
INDICATION: pancytopenia   Bone Marrow Biopsy and Aspiration Procedure Note   Informed consent was obtained and potential risks including bleeding, infection and pain were reviewed with the patient.  The patient's name, date of birth, identification, consent and allergies were verified prior to the start of procedure and time out was performed.  The left posterior iliac crest was chosen as the site of biopsy.  The skin was prepped with ChloraPrep.   16 cc of 2% lidocaine was used to provide local anaesthesia.   10 cc of bone marrow aspirate was obtained followed by 0.7cm biopsy.  Pressure was applied to the biopsy site and bandage was placed over the biopsy site. Patient was made to lie on the back for 30 mins prior to discharge. CBC prior to discharge  The procedure was tolerated moderately well.  Would recommend sedation if possible for future bone marrow biopsies. COMPLICATIONS: None BLOOD LOSS: none The patient was discharged home in stable condition with a 1 week follow up to review results.  Patient was provided with post bone marrow biopsy instructions and instructed to call if there was any bleeding or worsening pain.  Specimens sent for flow cytometry, cytogenetics and additional studies.  Signed Scot Dock, NP

## 2018-10-01 NOTE — Patient Instructions (Signed)
[place] Discharge Instructions for Post Bone Marrow Procedure  Today you had a bone marrow biopsy and aspirate of Left  hip Please keep the pressure dressing in place for at least 24 hours.  Have someone check your dressing periodically for bleeding.  If needed you can reapply a pressure dressing to the site.  Take pain medication as directed.  IF BLEEDING REOCCURS THAT SHOULD BE REPORTED IMMEDIATELY. Call the [facility] at [phone] if during business hours. Or report to the Emergency Room.   I have been informed and understand all the instructions given to me. I know to contact the clinic, my physician, or go to the Emergency Department if any problems should occur. I do not have any questions at this time, but understand that I may call the clinic during office hours at [phone]  should I have any questions or need assistance in obtaining follow up care.    __________________________________________  _____________  __________ Signature of Patient or Authorized Representative            Date                   Time    __________________________________________ Nurse's Signature

## 2018-10-03 LAB — MULTIPLE MYELOMA PANEL, SERUM
Albumin SerPl Elph-Mcnc: 3.5 g/dL (ref 2.9–4.4)
Albumin/Glob SerPl: 2 — ABNORMAL HIGH (ref 0.7–1.7)
Alpha 1: 0.2 g/dL (ref 0.0–0.4)
Alpha2 Glob SerPl Elph-Mcnc: 0.4 g/dL (ref 0.4–1.0)
B-Globulin SerPl Elph-Mcnc: 0.7 g/dL (ref 0.7–1.3)
Gamma Glob SerPl Elph-Mcnc: 0.5 g/dL (ref 0.4–1.8)
Globulin, Total: 1.8 g/dL — ABNORMAL LOW (ref 2.2–3.9)
IgA: 179 mg/dL (ref 87–352)
IgG (Immunoglobin G), Serum: 570 mg/dL — ABNORMAL LOW (ref 700–1600)
IgM (Immunoglobulin M), Srm: 67 mg/dL (ref 26–217)
Total Protein ELP: 5.3 g/dL — ABNORMAL LOW (ref 6.0–8.5)

## 2018-10-04 LAB — PNH PROFILE (-HIGH SENSITIVITY): Viability:: 98

## 2018-10-08 ENCOUNTER — Telehealth: Payer: Self-pay | Admitting: Hematology and Oncology

## 2018-10-08 ENCOUNTER — Encounter: Payer: Self-pay | Admitting: Hematology and Oncology

## 2018-10-08 ENCOUNTER — Inpatient Hospital Stay (HOSPITAL_BASED_OUTPATIENT_CLINIC_OR_DEPARTMENT_OTHER): Payer: Medicaid Other | Admitting: Hematology and Oncology

## 2018-10-08 DIAGNOSIS — K219 Gastro-esophageal reflux disease without esophagitis: Secondary | ICD-10-CM | POA: Diagnosis not present

## 2018-10-08 DIAGNOSIS — F17210 Nicotine dependence, cigarettes, uncomplicated: Secondary | ICD-10-CM

## 2018-10-08 DIAGNOSIS — R51 Headache: Secondary | ICD-10-CM

## 2018-10-08 DIAGNOSIS — E610 Copper deficiency: Secondary | ICD-10-CM | POA: Diagnosis not present

## 2018-10-08 DIAGNOSIS — Z79899 Other long term (current) drug therapy: Secondary | ICD-10-CM

## 2018-10-08 DIAGNOSIS — E559 Vitamin D deficiency, unspecified: Secondary | ICD-10-CM

## 2018-10-08 DIAGNOSIS — R42 Dizziness and giddiness: Secondary | ICD-10-CM

## 2018-10-08 DIAGNOSIS — E538 Deficiency of other specified B group vitamins: Secondary | ICD-10-CM | POA: Diagnosis not present

## 2018-10-08 DIAGNOSIS — J449 Chronic obstructive pulmonary disease, unspecified: Secondary | ICD-10-CM

## 2018-10-08 DIAGNOSIS — R2689 Other abnormalities of gait and mobility: Secondary | ICD-10-CM

## 2018-10-08 DIAGNOSIS — D61818 Other pancytopenia: Secondary | ICD-10-CM | POA: Diagnosis not present

## 2018-10-08 DIAGNOSIS — K909 Intestinal malabsorption, unspecified: Secondary | ICD-10-CM

## 2018-10-08 DIAGNOSIS — M19012 Primary osteoarthritis, left shoulder: Secondary | ICD-10-CM

## 2018-10-08 DIAGNOSIS — Z9049 Acquired absence of other specified parts of digestive tract: Secondary | ICD-10-CM

## 2018-10-08 DIAGNOSIS — K529 Noninfective gastroenteritis and colitis, unspecified: Secondary | ICD-10-CM

## 2018-10-08 DIAGNOSIS — Z9884 Bariatric surgery status: Secondary | ICD-10-CM

## 2018-10-08 DIAGNOSIS — F329 Major depressive disorder, single episode, unspecified: Secondary | ICD-10-CM

## 2018-10-08 NOTE — Assessment & Plan Note (Signed)
Unfortunately, her gastric bypass surgery has caused significant mineral deficiencies. She is having significant chronic abdominal pain. I will defer to her GI doctor for further management

## 2018-10-08 NOTE — Assessment & Plan Note (Signed)
She has received multiple doses of vitamin B12 injections I will space it to once a month and then every 3 months I plan to recheck her vitamin B12 level next month

## 2018-10-08 NOTE — Assessment & Plan Note (Signed)
She has severe vitamin D deficiency due to malabsorption. I recommend 5000 units daily high-dose oral replacement therapy and I plan to recheck in the month

## 2018-10-08 NOTE — Telephone Encounter (Signed)
Gave avs and calendar ° °

## 2018-10-08 NOTE — Assessment & Plan Note (Addendum)
I have reviewed her bone marrow biopsy with the pathologist Overall, the bone marrow findings are not consistent with bone marrow disorder.  Cytogenetics and FISH are still pending Her anemia corrected with aggressive vitamin B12 and intravenous iron replacement therapy She is still deficient in copper and possibly other trace minerals I recommend aggressive copper replacement therapy.  Unfortunately, intravenous copper is not available I recommend she goes to a vitamin store and start high-dose copper replacement therapy at 8 mg daily for week, 6 mg daily for a week, 4 mg daily for week and then finally 2 mg indefinitely I plan to see her again in the month for further follow-up. She does not need platelet transfusion for now

## 2018-10-08 NOTE — Progress Notes (Signed)
Barbara Stanley OFFICE PROGRESS NOTE  Lanae Boast, FNP  ASSESSMENT & PLAN:  Other pancytopenia [facility]) I have reviewed her bone marrow biopsy with the pathologist Overall, the bone marrow findings are not consistent with bone marrow disorder.  Cytogenetics and FISH are still pending Her anemia corrected with aggressive vitamin B12 and intravenous iron replacement therapy She is still deficient in copper and possibly other trace minerals I recommend aggressive copper replacement therapy.  Unfortunately, intravenous copper is not available I recommend she goes to a vitamin store and start high-dose copper replacement therapy at 8 mg daily for week, 6 mg daily for a week, 4 mg daily for week and then finally 2 mg indefinitely I plan to see her again in the month for further follow-up. She does not need platelet transfusion for now  S/P gastric bypass Unfortunately, her gastric bypass surgery has caused significant mineral deficiencies. She is having significant chronic abdominal pain. I will defer to her GI doctor for further management  Vitamin D deficiency She has severe vitamin D deficiency due to malabsorption. I recommend 5000 units daily high-dose oral replacement therapy and I plan to recheck in the month  Vitamin B12 deficiency due to intestinal malabsorption She has received multiple doses of vitamin B12 injections I will space it to once a month and then every 3 months I plan to recheck her vitamin B12 level next month   Orders Placed This Encounter  Procedures  . VITAMIN D 25 Hydroxy (Vit-D Deficiency, Fractures)    Standing Status:   Standing    Number of Occurrences:   9    Standing Expiration Date:   10/09/2019  . Vitamin B12    Standing Status:   Standing    Number of Occurrences:   9    Standing Expiration Date:   10/09/2019    INTERVAL HISTORY: Barbara Stanley 47 y.o. female returns for review of test results She is having a lot of abdominal pain  chronically. Her energy level is fair.  She denies recent infection. The patient denies any recent signs or symptoms of bleeding such as spontaneous epistaxis, hematuria or hematochezia.  SUMMARY OF HEMATOLOGIC HISTORY:  Barbara Stanley 47 y.o. female was transferred to my care after her prior physician has left.  I reviewed the patient's records extensive and collaborated the history with the patient. Summary of her history is as follows: The patient is noted to have progressive anemia for the past few years.  Starting around 2018, she was noted to have progressive mild pancytopenia.  Most recently, around October 2019, she is noted to have significant pancytopenia. She was referred to see hematologist for evaluation.  The patient has history of gastric bypass surgery.  She has significant recent weight loss. Her primary care doctor has ordered multiple mineral panel.  She was noted to have folate, B6, B12, copper as well as iron deficiency.  She has received multiple mineral replacement therapy.  When she was seen here at the hematology clinic, she received high-dose vitamin B12 injection. On August 09, 2018, she underwent ultrasound of her abdomen which showed no evidence of splenomegaly.  The patient has significant smoking history but denies alcohol intake She denies significant recent infection requiring intravenous antibiotic treatment. Infectious work-up was negative. Between October to December 2019, she has received intravenous iron infusion along with vitamin B12 injection. On October 01, 2018, she underwent bone marrow aspirate and biopsy to evaluate for pancytopenia.  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  ALLERGIES:   is allergic to other.  MEDICATIONS:  Current Outpatient Medications  Medication Sig Dispense Refill  . acetaminophen (TYLENOL) 500 MG tablet Take 1,000 mg by mouth daily as needed for moderate pain.    [name] albuterol (PROAIR HFA) 108 (90 Base) MCG/ACT inhaler Inhale 2 puffs into the lungs every 6 (six) hours as needed for wheezing or shortness of breath. 1 each 5  . budesonide-formoterol (SYMBICORT) 160-4.5 MCG/ACT inhaler Inhale 2 puffs into the lungs 2 (two) times daily. 1 Inhaler 12  . esomeprazole (NEXIUM) 40 MG capsule TAKE 1 CAPSULE BY MOUTH EVERY DAY 90 capsule 0  . folic acid (FOLVITE) 1 MG tablet Take 1 tablet (1 mg total) by mouth 2 (two) times daily with a meal. 60 tablet 2  . loperamide (IMODIUM) 2 MG capsule Take 1 capsule (2 mg total) by mouth 4 (four) times daily as needed for diarrhea or loose stools. 30 capsule 0  . SPIRIVA HANDIHALER 18 MCG inhalation capsule INHALE THE CONTENT OF ONE CAPSULE EVERY DAY 30 capsule 0  . traZODone (DESYREL) 150 MG tablet Take 1 tablet (150 mg total) by mouth at bedtime. For sleep 30 tablet 0  . venlafaxine XR (EFFEXOR-XR) 75 MG 24 hr capsule Take 1 capsule (75 mg total) by mouth daily with breakfast. For depression 30 capsule 0   No current facility-administered medications for this visit.      REVIEW OF SYSTEMS:   Constitutional: Denies fevers, chills or night sweats Eyes: Denies blurriness of vision Ears, nose, mouth, throat, and face: Denies mucositis or sore throat Respiratory: Denies cough, dyspnea or wheezes Cardiovascular: Denies palpitation, chest discomfort or lower extremity swelling Gastrointestinal:  Denies nausea, heartburn or change in bowel habits Skin: Denies abnormal skin rashes Lymphatics: Denies new lymphadenopathy or easy bruising Neurological:Denies numbness, tingling or new weaknesses Behavioral/Psych: Mood is stable, no new changes  All other systems were reviewed with the patient and are negative.  PHYSICAL  EXAMINATION: ECOG PERFORMANCE STATUS: 1 - Symptomatic but completely ambulatory  Vitals:   10/08/18 1438  BP: 96/64  Pulse: 64  Resp: 18  Temp: 98.8 F (37.1 C)  SpO2: 96%   Filed Weights   10/08/18 1438  Weight: 201 lb 9.6 oz (91.4 kg)    GENERAL:alert, no distress and comfortable NEURO: alert & oriented x 3 with fluent speech, no focal motor/sensory deficits  LABORATORY DATA:  I have reviewed the data as listed     Component Value Date/Time   NA 142 09/21/2018 1000   NA 144 08/01/2018 1154   K 3.9 09/21/2018 1000   CL 108 09/21/2018 1000   CO2 25 09/21/2018 1000   GLUCOSE 74 09/21/2018 1000   BUN 7 09/21/2018 1000   BUN 10 08/01/2018 1154   CREATININE 0.70 09/21/2018 1000   CREATININE 0.59 08/07/2017 1410   CALCIUM 8.6 (L) 09/21/2018 1000   PROT 5.2 (L) 09/06/2018 1459   PROT 5.0 (L) 08/01/2018 1154   ALBUMIN 3.1 (L) 09/06/2018 1459   ALBUMIN 3.3 (L) 08/01/2018 1154   AST 16 09/06/2018 1459   ALT 14 09/06/2018 1459   ALKPHOS 83 09/06/2018 1459   BILITOT 1.3 (H) 09/06/2018 1459   BILITOT 0.5 08/01/2018 1154   GFRNONAA >60 09/21/2018 1000   GFRNONAA 110  08/07/2017 1410   GFRAA >60 09/21/2018 1000   GFRAA 127 08/07/2017 1410    No results found for: SPEP, UPEP  Lab Results  Component Value Date   WBC 3.4 (L) 10/01/2018   NEUTROABS 2.5 10/01/2018   HGB 11.2 (L) 10/01/2018   HCT 35.4 (L) 10/01/2018   MCV 95.7 10/01/2018   PLT 24 (L) 10/01/2018      Chemistry      Component Value Date/Time   NA 142 09/21/2018 1000   NA 144 08/01/2018 1154   K 3.9 09/21/2018 1000   CL 108 09/21/2018 1000   CO2 25 09/21/2018 1000   BUN 7 09/21/2018 1000   BUN 10 08/01/2018 1154   CREATININE 0.70 09/21/2018 1000   CREATININE 0.59 08/07/2017 1410      Component Value Date/Time   CALCIUM 8.6 (L) 09/21/2018 1000   ALKPHOS 83 09/06/2018 1459   AST 16 09/06/2018 1459   ALT 14 09/06/2018 1459   BILITOT 1.3 (H) 09/06/2018 1459   BILITOT 0.5 08/01/2018 1154        I spent 15 minutes counseling the patient face to face. The total time spent in the appointment was 20 minutes and more than 50% was on counseling.   All questions were answered. The patient knows to call the clinic with any problems, questions or concerns. No barriers to learning was detected.    Heath Lark, MD 12/23/20193:07 PM

## 2018-10-11 ENCOUNTER — Encounter (HOSPITAL_COMMUNITY): Payer: Self-pay | Admitting: Hematology and Oncology

## 2018-10-24 ENCOUNTER — Other Ambulatory Visit: Payer: Self-pay | Admitting: Family Medicine

## 2018-10-29 ENCOUNTER — Encounter: Payer: Self-pay | Admitting: Gastroenterology

## 2018-11-01 ENCOUNTER — Ambulatory Visit: Payer: Self-pay

## 2018-11-08 ENCOUNTER — Inpatient Hospital Stay: Payer: Medicaid Other

## 2018-11-08 ENCOUNTER — Inpatient Hospital Stay: Payer: Medicaid Other | Attending: Hematology and Oncology

## 2018-11-08 ENCOUNTER — Telehealth: Payer: Self-pay | Admitting: Hematology and Oncology

## 2018-11-08 ENCOUNTER — Inpatient Hospital Stay (HOSPITAL_BASED_OUTPATIENT_CLINIC_OR_DEPARTMENT_OTHER): Payer: Medicaid Other | Admitting: Hematology and Oncology

## 2018-11-08 ENCOUNTER — Encounter: Payer: Self-pay | Admitting: Hematology and Oncology

## 2018-11-08 DIAGNOSIS — E559 Vitamin D deficiency, unspecified: Secondary | ICD-10-CM | POA: Insufficient documentation

## 2018-11-08 DIAGNOSIS — E538 Deficiency of other specified B group vitamins: Secondary | ICD-10-CM | POA: Diagnosis not present

## 2018-11-08 DIAGNOSIS — F17210 Nicotine dependence, cigarettes, uncomplicated: Secondary | ICD-10-CM

## 2018-11-08 DIAGNOSIS — Z9884 Bariatric surgery status: Secondary | ICD-10-CM

## 2018-11-08 DIAGNOSIS — D61818 Other pancytopenia: Secondary | ICD-10-CM | POA: Diagnosis present

## 2018-11-08 DIAGNOSIS — D696 Thrombocytopenia, unspecified: Secondary | ICD-10-CM

## 2018-11-08 DIAGNOSIS — Z79899 Other long term (current) drug therapy: Secondary | ICD-10-CM | POA: Diagnosis not present

## 2018-11-08 DIAGNOSIS — E610 Copper deficiency: Secondary | ICD-10-CM | POA: Insufficient documentation

## 2018-11-08 DIAGNOSIS — K909 Intestinal malabsorption, unspecified: Secondary | ICD-10-CM

## 2018-11-08 DIAGNOSIS — F17200 Nicotine dependence, unspecified, uncomplicated: Secondary | ICD-10-CM

## 2018-11-08 LAB — CBC WITH DIFFERENTIAL/PLATELET
Abs Immature Granulocytes: 0.02 10*3/uL (ref 0.00–0.07)
Basophils Absolute: 0 10*3/uL (ref 0.0–0.1)
Basophils Relative: 1 %
Eosinophils Absolute: 0 10*3/uL (ref 0.0–0.5)
Eosinophils Relative: 1 %
HCT: 40.2 % (ref 36.0–46.0)
Hemoglobin: 13.1 g/dL (ref 12.0–15.0)
Immature Granulocytes: 1 %
Lymphocytes Relative: 17 %
Lymphs Abs: 0.8 10*3/uL (ref 0.7–4.0)
MCH: 31.1 pg (ref 26.0–34.0)
MCHC: 32.6 g/dL (ref 30.0–36.0)
MCV: 95.5 fL (ref 80.0–100.0)
MONO ABS: 0.2 10*3/uL (ref 0.1–1.0)
Monocytes Relative: 4 %
NEUTROS ABS: 3.4 10*3/uL (ref 1.7–7.7)
Neutrophils Relative %: 76 %
Platelets: 25 10*3/uL — ABNORMAL LOW (ref 150–400)
RBC: 4.21 MIL/uL (ref 3.87–5.11)
RDW: 14.4 % (ref 11.5–15.5)
WBC: 4.4 10*3/uL (ref 4.0–10.5)
nRBC: 0 % (ref 0.0–0.2)

## 2018-11-08 LAB — VITAMIN B12: Vitamin B-12: 413 pg/mL (ref 180–914)

## 2018-11-08 MED ORDER — CYANOCOBALAMIN 1000 MCG/ML IJ SOLN
INTRAMUSCULAR | Status: AC
  Filled 2018-11-08: qty 1

## 2018-11-08 MED ORDER — CYANOCOBALAMIN 1000 MCG/ML IJ SOLN
1000.0000 ug | Freq: Once | INTRAMUSCULAR | Status: AC
  Administered 2018-11-08: 1000 ug via INTRAMUSCULAR

## 2018-11-08 NOTE — Assessment & Plan Note (Signed)
She had severe vitamin D deficiency recently and is on 5000 units daily Repeat vitamin D level is pending.  I will call her with test results to see if she needs prescription strength vitamin D replacement therapy

## 2018-11-08 NOTE — Assessment & Plan Note (Signed)
We discussed the importance of tobacco cessation 

## 2018-11-08 NOTE — Assessment & Plan Note (Signed)
She has severe copper deficiency and is taking oral copper supplement.  She will continue the same

## 2018-11-08 NOTE — Assessment & Plan Note (Addendum)
Her pancytopenia is improving.  Platelet count is stable. Leukopenia and anemia has resolved She will proceed with copper replacement along with vitamin B12 injection today I will call her with test results once they are available to determine her next appointment She does not need transfusion support for thrombocytopenia.  Observe for now

## 2018-11-08 NOTE — Telephone Encounter (Signed)
No los °

## 2018-11-08 NOTE — Assessment & Plan Note (Signed)
B12 level is pending.  She will proceed with B12 injection today.  Depending on the level of her B12, I will decide whether she still needs monthly B12 or whether we can space out her appointment

## 2018-11-08 NOTE — Progress Notes (Signed)
Barbara Stanley  Barbara Boast, FNP  ASSESSMENT & PLAN:  Other pancytopenia [facility]) Her pancytopenia is improving.  Platelet count is stable. Leukopenia and anemia has resolved She will proceed with copper replacement along with vitamin B12 injection today I will call her with test results once they are available to determine her next appointment She does not need transfusion support for thrombocytopenia.  Observe for now  Vitamin B12 deficiency due to intestinal malabsorption B12 level is pending.  She will proceed with B12 injection today.  Depending on the level of her B12, I will decide whether she still needs monthly B12 or whether we can space out her appointment  Vitamin D deficiency She had severe vitamin D deficiency recently and is on 5000 units daily Repeat vitamin D level is pending.  I will call her with test results to see if she needs prescription strength vitamin D replacement therapy  Tobacco use disorder We discussed the importance of tobacco cessation.  Dietary copper deficiency She has severe copper deficiency and is taking oral copper supplement.  She will continue the same   Orders Placed This Encounter  Procedures  . Ferritin    Standing Status:   Standing    Number of Occurrences:   9    Standing Expiration Date:   11/09/2019  . Iron and TIBC    Standing Status:   Standing    Number of Occurrences:   9    Standing Expiration Date:   11/09/2019  . Copper, serum    Standing Status:   Standing    Number of Occurrences:   9    Standing Expiration Date:   11/09/2019    INTERVAL HISTORY: Barbara Stanley 48 y.o. female returns for further follow-up with her husband She feels well She has better energy level She has mild intermittent bleeding when she goes to the bathroom to urinate or have bowel movement No spontaneous hematuria, epistaxis or hematochezia She is still smoking several cigarettes per day No recent infection,  fever or chills  SUMMARY OF HEMATOLOGIC HISTORY:  Barbara Stanley 48 y.o. female was transferred to my care after her prior physician has left.  I reviewed the patient's records extensive and collaborated the history with the patient. Summary of her history is as follows: The patient is noted to have progressive anemia for the past few years.  Starting around 2018, she was noted to have progressive mild pancytopenia.  Most recently, around October 2019, she is noted to have significant pancytopenia. She was referred to see hematologist for evaluation.  The patient has history of gastric bypass surgery.  She has significant recent weight loss. Her primary care doctor has ordered multiple mineral panel.  She was noted to have folate, B6, B12, copper as well as iron deficiency.  She has received multiple mineral replacement therapy.  When she was seen here at the hematology clinic, she received high-dose vitamin B12 injection. On August 09, 2018, she underwent ultrasound of her abdomen which showed no evidence of splenomegaly.  The patient has significant smoking history but denies alcohol intake She denies significant recent infection requiring intravenous antibiotic treatment. Infectious work-up was negative. Between October to December 2019, she has received intravenous iron infusion along with vitamin B12 injection. On October 01, 2018, she underwent bone marrow aspirate and biopsy to evaluate for pancytopenia.  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  She was  given vitamin B12 injection, oral copper and oral vitamin D replacement therapy.  I have reviewed the past medical history, past surgical history, social history and family history with the patient and they are unchanged from previous Stanley.  ALLERGIES:  is allergic to other.  MEDICATIONS:  Current Outpatient Medications  Medication Sig  Dispense Refill  . cholecalciferol (VITAMIN D3) 25 MCG (1000 UT) tablet Take 5,000 Units by mouth daily.    . Copper Gluconate 2 MG CAPS Take 4 mg by mouth daily.    . Cyanocobalamin (VITAMIN B-12 IJ) Inject 1 mg as directed every 30 (thirty) days.    [name] acetaminophen (TYLENOL) 500 MG tablet Take 1,000 mg by mouth daily as needed for moderate pain.    [name] albuterol (PROAIR HFA) 108 (90 Base) MCG/ACT inhaler Inhale 2 puffs into the lungs every 6 (six) hours as needed for wheezing or shortness of breath. 1 each 5  . budesonide-formoterol (SYMBICORT) 160-4.5 MCG/ACT inhaler Inhale 2 puffs into the lungs 2 (two) times daily. 1 Inhaler 12  . esomeprazole (NEXIUM) 40 MG capsule TAKE 1 CAPSULE BY MOUTH EVERY DAY 90 capsule 0  . folic acid (FOLVITE) 1 MG tablet Take 1 tablet (1 mg total) by mouth 2 (two) times daily with a meal. 60 tablet 2  . loperamide (IMODIUM) 2 MG capsule Take 1 capsule (2 mg total) by mouth 4 (four) times daily as needed for diarrhea or loose stools. 30 capsule 0  . SPIRIVA HANDIHALER 18 MCG inhalation capsule INHALE THE CONTENT OF ONE CAPSULE EVERY DAY 30 capsule 0  . traZODone (DESYREL) 150 MG tablet Take 1 tablet (150 mg total) by mouth at bedtime. For sleep 30 tablet 0  . venlafaxine XR (EFFEXOR-XR) 75 MG 24 hr capsule Take 1 capsule (75 mg total) by mouth daily with breakfast. For depression 30 capsule 0   No current facility-administered medications for this visit.      REVIEW OF SYSTEMS:   Constitutional: Denies fevers, chills or night sweats Eyes: Denies blurriness of vision Ears, nose, mouth, throat, and face: Denies mucositis or sore throat Respiratory: Denies cough, dyspnea or wheezes Cardiovascular: Denies palpitation, chest discomfort or lower extremity swelling Gastrointestinal:  Denies nausea, heartburn or change in bowel habits Skin: Denies abnormal skin rashes Lymphatics: Denies new lymphadenopathy or easy bruising Neurological:Denies numbness, tingling or new  weaknesses Behavioral/Psych: Mood is stable, no new changes  All other systems were reviewed with the patient and are negative.  PHYSICAL EXAMINATION: ECOG PERFORMANCE STATUS: 0 - Asymptomatic  Vitals:   11/08/18 0926  BP: 117/75  Pulse: (!) 104  Resp: 18  Temp: 98.4 F (36.9 C)  SpO2: 96%   Filed Weights   11/08/18 0926  Weight: 205 lb 9.6 oz (93.3 kg)    GENERAL:alert, no distress and comfortable Musculoskeletal:no cyanosis of digits and no clubbing  NEURO: alert & oriented x 3 with fluent speech, no focal motor/sensory deficits  LABORATORY DATA:  I have reviewed the data as listed     Component Value Date/Time   NA 142 09/21/2018 1000   NA 144 08/01/2018 1154   K 3.9 09/21/2018 1000   CL 108 09/21/2018 1000   CO2 25 09/21/2018 1000   GLUCOSE 74 09/21/2018 1000   BUN 7 09/21/2018 1000   BUN 10 08/01/2018 1154   CREATININE 0.70 09/21/2018 1000   CREATININE 0.59 08/07/2017 1410   CALCIUM 8.6 (L) 09/21/2018 1000   PROT 5.2 (L) 09/06/2018 1459   PROT 5.0 (L) 08/01/2018 1154  ALBUMIN 3.1 (L) 09/06/2018 1459   ALBUMIN 3.3 (L) 08/01/2018 1154   AST 16 09/06/2018 1459   ALT 14 09/06/2018 1459   ALKPHOS 83 09/06/2018 1459   BILITOT 1.3 (H) 09/06/2018 1459   BILITOT 0.5 08/01/2018 1154   GFRNONAA >60 09/21/2018 1000   GFRNONAA 110 08/07/2017 1410   GFRAA >60 09/21/2018 1000   GFRAA 127 08/07/2017 1410    No results found for: SPEP, UPEP  Lab Results  Component Value Date   WBC 4.4 11/08/2018   NEUTROABS 3.4 11/08/2018   HGB 13.1 11/08/2018   HCT 40.2 11/08/2018   MCV 95.5 11/08/2018   PLT 25 (L) 11/08/2018      Chemistry      Component Value Date/Time   NA 142 09/21/2018 1000   NA 144 08/01/2018 1154   K 3.9 09/21/2018 1000   CL 108 09/21/2018 1000   CO2 25 09/21/2018 1000   BUN 7 09/21/2018 1000   BUN 10 08/01/2018 1154   CREATININE 0.70 09/21/2018 1000   CREATININE 0.59 08/07/2017 1410      Component Value Date/Time   CALCIUM 8.6 (L)  09/21/2018 1000   ALKPHOS 83 09/06/2018 1459   AST 16 09/06/2018 1459   ALT 14 09/06/2018 1459   BILITOT 1.3 (H) 09/06/2018 1459   BILITOT 0.5 08/01/2018 1154       I spent 10 minutes counseling the patient face to face. The total time spent in the appointment was 15 minutes and more than 50% was on counseling.   All questions were answered. The patient knows to call the clinic with any problems, questions or concerns. No barriers to learning was detected.    Heath Lark, MD 1/23/20209:38 AM

## 2018-11-08 NOTE — Patient Instructions (Signed)
Cyanocobalamin, Vitamin B12 injection What is this medicine? CYANOCOBALAMIN (sye an oh koe BAL a min) is a man made form of vitamin B12. Vitamin B12 is used in the growth of healthy blood cells, nerve cells, and proteins in the body. It also helps with the metabolism of fats and carbohydrates. This medicine is used to treat people who can not absorb vitamin B12. This medicine may be used for other purposes; ask your health care provider or pharmacist if you have questions. COMMON BRAND NAME(S): B-12 Compliance Kit, B-12 Injection Kit, Cyomin, LA-12, Nutri-Twelve, Physicians EZ Use B-12, Primabalt What should I tell my health care provider before I take this medicine? They need to know if you have any of these conditions: -kidney disease -Leber's disease -megaloblastic anemia -an unusual or allergic reaction to cyanocobalamin, cobalt, other medicines, foods, dyes, or preservatives -pregnant or trying to get pregnant -breast-feeding How should I use this medicine? This medicine is injected into a muscle or deeply under the skin. It is usually given by a health care professional in a clinic or doctor's office. However, your doctor may teach you how to inject yourself. Follow all instructions. Talk to your pediatrician regarding the use of this medicine in children. Special care may be needed. Overdosage: If you think you have taken too much of this medicine contact a poison control center or emergency room at once. NOTE: This medicine is only for you. Do not share this medicine with others. What if I miss a dose? If you are given your dose at a clinic or doctor's office, call to reschedule your appointment. If you give your own injections and you miss a dose, take it as soon as you can. If it is almost time for your next dose, take only that dose. Do not take double or extra doses. What may interact with this medicine? -colchicine -heavy alcohol intake This list may not describe all possible  interactions. Give your health care provider a list of all the medicines, herbs, non-prescription drugs, or dietary supplements you use. Also tell them if you smoke, drink alcohol, or use illegal drugs. Some items may interact with your medicine. What should I watch for while using this medicine? Visit your doctor or health care professional regularly. You may need blood work done while you are taking this medicine. You may need to follow a special diet. Talk to your doctor. Limit your alcohol intake and avoid smoking to get the best benefit. What side effects may I notice from receiving this medicine? Side effects that you should report to your doctor or health care professional as soon as possible: -allergic reactions like skin rash, itching or hives, swelling of the face, lips, or tongue -blue tint to skin -chest tightness, pain -difficulty breathing, wheezing -dizziness -red, swollen painful area on the leg Side effects that usually do not require medical attention (report to your doctor or health care professional if they continue or are bothersome): -diarrhea -headache This list may not describe all possible side effects. Call your doctor for medical advice about side effects. You may report side effects to FDA at 1-800-FDA-1088. Where should I keep my medicine? Keep out of the reach of children. Store at room temperature between 15 and 30 degrees C (59 and 85 degrees F). Protect from light. Throw away any unused medicine after the expiration date. NOTE: This sheet is a summary. It may not cover all possible information. If you have questions about this medicine, talk to your doctor, pharmacist, or   health care provider.  2019 Elsevier/Gold Standard (2008-01-14 22:10:20)  

## 2018-11-09 LAB — VITAMIN D 25 HYDROXY (VIT D DEFICIENCY, FRACTURES): Vit D, 25-Hydroxy: 29.4 ng/mL — ABNORMAL LOW (ref 30.0–100.0)

## 2018-11-12 ENCOUNTER — Telehealth: Payer: Self-pay | Admitting: *Deleted

## 2018-11-12 NOTE — Telephone Encounter (Signed)
TCT patient regarding recent labs. Per Dr. Alvy Bimler, B12 level and Vitamin D is good.  Pt is to continue current Vitamin D dose. The plan is to space out her B12 injections to 3 months.  Scheduling request sent for labs, MD appt and injection.  Pt called back and above information was shared with her.  Pt voiced understanding.

## 2018-11-14 ENCOUNTER — Telehealth: Payer: Self-pay | Admitting: Hematology and Oncology

## 2018-11-14 NOTE — Telephone Encounter (Signed)
Scheduled appt per 1/27 sch message - pt is aware of appt date and time   

## 2018-11-20 ENCOUNTER — Other Ambulatory Visit: Payer: Self-pay | Admitting: Family Medicine

## 2018-11-20 ENCOUNTER — Other Ambulatory Visit: Payer: Self-pay

## 2018-11-20 ENCOUNTER — Telehealth: Payer: Self-pay

## 2018-11-20 ENCOUNTER — Other Ambulatory Visit: Payer: Self-pay | Admitting: Hematology and Oncology

## 2018-11-20 DIAGNOSIS — K219 Gastro-esophageal reflux disease without esophagitis: Secondary | ICD-10-CM

## 2018-11-20 DIAGNOSIS — J449 Chronic obstructive pulmonary disease, unspecified: Secondary | ICD-10-CM

## 2018-11-20 MED ORDER — BUDESONIDE-FORMOTEROL FUMARATE 160-4.5 MCG/ACT IN AERO: 2.0000 | INHALATION_SPRAY | Freq: Two times a day (BID) | RESPIRATORY_TRACT | 12 refills | Status: DC

## 2018-11-20 NOTE — Telephone Encounter (Signed)
Refills sent into pharmacy. Thanks!  

## 2018-11-21 ENCOUNTER — Telehealth: Payer: Self-pay

## 2018-11-21 NOTE — Telephone Encounter (Signed)
Dr. Alvy Bimler is current primary Hem/Onc provider.

## 2018-11-21 NOTE — Telephone Encounter (Signed)
Per Dr. Alvy Bimler, ok to call in refill for 60 tabs for the Zofran, no refills, although I am not giving her any chemo. Just vitamin supplement. She may need GI eval to see why she has nausea. Called and given above message. She verbalized understanding. She said GI is waiting until her platelets are above 55, then they will schedule a colonoscopy and EGD.

## 2018-11-21 NOTE — Telephone Encounter (Signed)
OK to call in refill for 60 tabs, no refills, although I am not giving her any chemo. Just vitamin supplement She may need GI eval to see why she has nausea

## 2018-11-27 ENCOUNTER — Ambulatory Visit: Payer: Self-pay

## 2018-12-18 ENCOUNTER — Other Ambulatory Visit: Payer: Self-pay | Admitting: Family Medicine

## 2018-12-20 ENCOUNTER — Ambulatory Visit: Payer: Self-pay | Admitting: Family Medicine

## 2018-12-24 ENCOUNTER — Other Ambulatory Visit: Payer: Self-pay

## 2018-12-24 ENCOUNTER — Ambulatory Visit (INDEPENDENT_AMBULATORY_CARE_PROVIDER_SITE_OTHER): Payer: Medicaid Other | Admitting: Family Medicine

## 2018-12-24 VITALS — BP 124/69 | HR 70 | Temp 98.9°F | Ht 68.0 in | Wt 194.0 lb

## 2018-12-24 DIAGNOSIS — K219 Gastro-esophageal reflux disease without esophagitis: Secondary | ICD-10-CM

## 2018-12-24 DIAGNOSIS — K909 Intestinal malabsorption, unspecified: Secondary | ICD-10-CM

## 2018-12-24 DIAGNOSIS — E610 Copper deficiency: Secondary | ICD-10-CM

## 2018-12-24 DIAGNOSIS — J449 Chronic obstructive pulmonary disease, unspecified: Secondary | ICD-10-CM

## 2018-12-24 DIAGNOSIS — F17200 Nicotine dependence, unspecified, uncomplicated: Secondary | ICD-10-CM

## 2018-12-24 DIAGNOSIS — E559 Vitamin D deficiency, unspecified: Secondary | ICD-10-CM

## 2018-12-24 DIAGNOSIS — E876 Hypokalemia: Secondary | ICD-10-CM

## 2018-12-24 DIAGNOSIS — E538 Deficiency of other specified B group vitamins: Secondary | ICD-10-CM | POA: Diagnosis not present

## 2018-12-24 MED ORDER — TIOTROPIUM BROMIDE MONOHYDRATE 18 MCG IN CAPS: ORAL_CAPSULE | RESPIRATORY_TRACT | 5 refills | Status: DC

## 2018-12-24 NOTE — Patient Instructions (Signed)
Eating Plan to Prevent Vitamin D Deficiency Vitamin D is a nutrient that helps you to absorb calcium from food. It plays a key role in building and maintaining healthy bones and teeth, muscle function, and immunity from infections. Getting plenty of vitamin D can help to keep you from developing conditions that cause bones to be brittle, such as rickets or osteoporosis. If you are over age 48, not having enough vitamin D may weaken your muscles and bones and increase your risk for falls and fractures. Our bodies make vitamin D when our skin is exposed to direct sunlight. However, for many people, this may not be enough vitamin D to meet the body's needs. When you get too little vitamin D, it is called a deficiency. You may be at risk for a vitamin D deficiency if you:  Are pregnant.  Are obese.  Are over 71 years old.  Have dark skin.  Spend most of your day indoors.  Live in [place].  Cover your skin all of the time when you are outdoors.  Have a condition that limits your ability to absorb fat, such as inflammatory bowel disease.  Have had gastric bypass surgery. Breastfed infants are also at risk for vitamin D deficiency. If you are at risk for vitamin D deficiency, your health care provider may recommend that you take a vitamin D supplement. If you have certain diseases, your health care provider may recommend that you take a vitamin D supplement as part of your treatment. What is my plan? General recommendations for daily vitamin D intake vary by these categories:  Infants: 400 International Units.  Children over 81-year-old: 600 International Units.  Adults: 600 International Units.  Adults over 46 years old: 57 International Units.  Pregnant and breastfeeding women: 600 International Units. Your health care provider may recommend a different amount of vitamin D intake based on your specific needs and your overall health. What do I need to know about vitamin D  intake? You can meet your daily vitamin D needs from:  Direct exposure to sunlight.  Food.  Dietary supplements. Talk with your health care provider before you start taking any vitamin D supplements. You may be more sensitive to the side effects of vitamin D supplements if you are on certain medicines or have certain medical conditions. Be sure to tell your health care provider about all medicines you are taking, including vitamin, mineral, and herbal supplements. Take medicines and supplements only as told by your health care provider. What foods contain vitamin D? Grains Fortified cereals. Meats and Other Protein Sources Beef liver. Egg yolk. [place]. Salmon. Swordfish. Shrimp. Sardines. [place]. Dairy Fortified milk. Cheese. Fortified yogurt. Beverages Infant formula. Fortified orange juice. Fortified soy milk. What are some tips for getting enough vitamin D?  Expose your skin to direct sunlight for at least 15 minutes every day. If you have dark skin, you may need to expose your skin for a longer period of time. Make sure youprotect your skin from too much sun exposure. This helps to prevent skin cancer.  Eat a balanced diet each day that includes both of these: ? 2 -3 servings of meat and meat alternatives. ? 2 servings of dairy.  Talk with your health care provider about taking a vitamin D supplement if: ? You live in far [place]. ? You live in a location that has cloudy days for half of the year. ? You regularly avoid exposure to sunlight. ? You completely cover your body with clothes or  sunscreen when you are outdoors. ? You are over 83 years old. Taking a supplement may help to strengthen your bones and prevent fractures.  Infants who consume less than 32 oz of infant formula each day should receive a daily vitamin D supplement.  If your doctor has recommended a vitamin D supplement to help treat a disease, you should have your blood levels of vitamin D checked every  3 months. This information is not intended to replace advice given to you by your health care provider. Make sure you discuss any questions you have with your health care provider. Document Released: 06/24/2015 Document Revised: 05/28/2016 Document Reviewed: 01/28/2015 Elsevier Interactive Patient Education  2019 [employer]. Smoking and Musculoskeletal Health Smoking is bad for your health. Most people know that smoking causes lung disease, heart disease, and cancer. But people may not realize that it also affects their bones, muscles, and joints (musculoskeletal system). When you smoke, the effects on your lungs and heart result in less oxygen for your musculoskeletal system. This can lead to poor bone and joint health. How can smoking affect my musculoskeletal health? Smoking can:  Increase your risk of having weak, thin bones (osteoporosis). Elderly smokers are at higher risk for bone fractures related to osteoporosis.  Decrease the ability of bone-forming cells to make and replace bone (in addition to reducing oxygen and blood flow).  Reduce your body's ability to absorb calcium from your diet. Less calcium means weaker bones.  Interfere with the breakdown of the female hormone estrogen. Smoking lowers estrogen, which is a hormone that helps keep bones strong. Women who smoke may have earlier menopause. Menopause is a risk factor for osteoporosis.  Weaken the tissues that attach bones to muscles (tendons). This can lead to shoulder, back, and other joint injuries.  Increase your risk of rheumatoid arthritis or make the condition worse if you already have it.  Slow down healing and increase your risk of infection and other complications if you have a bone fracture or surgery that involves your musculoskeletal system.  Make you get out of breath easily. This can keep you from getting the exercise you need to keep your bones and joints healthy.  Decrease your appetite and body mass. You  may lose weight and muscle strength. This can put you at higher risk for muscle injury, joint injury, and broken bones. What actions can I take to prevent musculoskeletal problems? Quit smoking      Do not start smoking. Quit if you already do. Even stopping later in life can improve musculoskeletal health.  Do not use any products that contain nicotine or tobacco. Do not replace cigarette smoking with e-cigarettes. The safety of e-cigarettes is not known, and some may contain harmful chemicals.  Make a plan to quit smoking and commit to it. Look for programs to help you, and ask your health care provider for recommendations and ideas.  Talk with your health care provider about using nicotine replacement medicines to help you quit, such as gum, lozenges, patches, sprays, or pills. Make other lifestyle changes   Eat a healthy diet that includes calcium and vitamin D. These nutrients are important for bone health. ? Calcium is found in dairy foods and green leafy vegetables. ? Vitamin D is found in eggs, fish, and liver. ? Many foods also have vitamin D and calcium added to them (are fortified). ? Ask your health care provider if you would benefit from taking a supplement.  Get out in the sunshine for  a short time every day. This increases production of vitamin D.  Get 30 minutes of exercise at least 5 days a week. Weight-bearing and strength exercises are best for musculoskeletal health. Ask your health care provider what type of exercise is safe for you.  Do not drink alcohol if: ? Your health care provider tells you not to drink. ? You are pregnant, may be pregnant, or are planning to become pregnant.  If you drink alcohol, limit how much you have: ? 0-1 drink a day for women. ? 0-2 drinks a day for men.  Be aware of how much alcohol is in your drink. In the [place] one drink equals one 12 oz bottle of beer (355 mL), one 5 oz glass of wine (148 mL), or one 1 oz glass of hard liquor  (44 mL). Where to find more information You may find more information about smoking, musculoskeletal health, and quitting smoking from:  [place] [employer]: orthoinfo.aaos.[place], Osteoporosis and Related [place]: bones.SouthExposed.es  HelpGuide.org: helpguide.org  https://hall.com/: smokefree.gov  American Lung Association: lung.org Contact a health care provider if:  You need help to quit smoking. Summary  When you smoke, the effects on your lungs and heart result in less oxygen for your musculoskeletal system.  Even stopping smoking later in life can improve musculoskeletal health.  Do not use any products that contain nicotine or tobacco, such as cigarettes and e-cigarettes.  If you need help quitting, ask your health care provider. This information is not intended to replace advice given to you by your health care provider. Make sure you discuss any questions you have with your health care provider. Document Released: 01/29/2018 Document Revised: 01/29/2018 Document Reviewed: 01/29/2018 Elsevier Interactive Patient Education  [employer].

## 2018-12-24 NOTE — Progress Notes (Signed)
Patient [facility]   Progress Note: General Provider: Lanae Boast, FNP  SUBJECTIVE:   Barbara Stanley is a 48 y.o. female who  has a past medical history of Anemia, COPD (chronic obstructive pulmonary disease) ([place]), Depression, Emphysema, GERD (gastroesophageal reflux disease), and Tobacco abuse.. Patient presents today for Follow-up Patient smokes 1 ppd and is not currently interested in decreasing or quitting. She reports that she is breathing better, but still has a productive cough. She has been seen by  Dr. Tarri Glenn in GI and is awaiting a colonoscopy and endoscopy pending increase in platelets. Is being followed by Dr. 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  Patient is followed by [facility]. She states that her medications were increased last week and she has a follow up appointment scheduled.   Review of Systems  Constitutional: Positive for malaise/fatigue (improving).  HENT: Negative.   Eyes: Negative.   Respiratory: Negative.   Cardiovascular: Negative.   Gastrointestinal: Negative.   Genitourinary: Negative.   Musculoskeletal: Negative.   Skin: Negative.   Neurological: Negative.   Psychiatric/Behavioral: Negative.      OBJECTIVE: BP 124/69 (BP Location: Left Arm, Patient Position: Sitting, Cuff Size: Normal)   Pulse 70   Temp 98.9 F (37.2 C) (Oral)   Ht 5\' 8"  (1.727 m)   Wt 194 lb (88 kg)   SpO2 98%   BMI 29.50 kg/m   Wt Readings from Last 3 Encounters:  12/24/18 194 lb (88 kg)  11/08/18 205 lb 9.6 oz (93.3 kg)  10/08/18 201 lb 9.6 oz (91.4 kg)     Physical Exam Vitals signs and nursing note reviewed.  Constitutional:      General: She is not in acute distress.    Appearance: Normal appearance.  HENT:     Head: Normocephalic and atraumatic.  Eyes:     Extraocular Movements:  Extraocular movements intact.     Conjunctiva/sclera: Conjunctivae normal.     Pupils: Pupils are equal, round, and reactive to light.  Cardiovascular:     Rate and Rhythm: Normal rate and regular rhythm.     Heart sounds: No murmur.  Pulmonary:     Effort: Pulmonary effort is normal.     Breath sounds: Normal breath sounds.  Musculoskeletal: Normal range of motion.  Skin:    General: Skin is warm and dry.  Neurological:     Mental Status: She is alert and oriented to person, place, and time.  Psychiatric:        Mood and Affect: Mood normal.        Behavior: Behavior normal.        Thought Content: Thought content normal.        Judgment: Judgment normal.     ASSESSMENT/PLAN:   1. Chronic obstructive pulmonary disease, unspecified COPD type ([place])  2. Gastroesophageal reflux disease, esophagitis presence not specified  3. Vitamin B12 deficiency due to intestinal malabsorption  4. Tobacco use disorder  5. Hypokalemia  6. Vitamin D deficiency  7. Dietary copper deficiency Patient is followed by heme/onc. She has been encouraged to stop smoking. She is not ready to quit. Explained the risks of smoking. No medication changes warranted at the present time.    Return in about 3 months (around 03/26/2019) for COPD.    The patient was given clear instructions to go to ER or return to medical center  if symptoms do not improve, worsen or new problems develop. The patient verbalized understanding and agreed with plan of care.   [name], FNP-BC [facility] [address] [place], [place] [address] [phone]

## 2018-12-26 ENCOUNTER — Ambulatory Visit
Admission: RE | Admit: 2018-12-26 | Discharge: 2018-12-26 | Disposition: A | Discharge status: Home / Self Care | Payer: Medicaid Other | Source: Ambulatory Visit | Attending: Family Medicine | Admitting: Family Medicine

## 2018-12-26 ENCOUNTER — Other Ambulatory Visit: Payer: Self-pay

## 2018-12-26 DIAGNOSIS — Z1231 Encounter for screening mammogram for malignant neoplasm of breast: Secondary | ICD-10-CM

## 2019-01-03 MED ORDER — FAMOTIDINE 40 MG PO TABS: 40.0000 mg | ORAL_TABLET | Freq: Every day | ORAL | 1 refills | Status: DC

## 2019-01-21 ENCOUNTER — Other Ambulatory Visit: Payer: Self-pay | Admitting: Family Medicine

## 2019-01-21 DIAGNOSIS — J449 Chronic obstructive pulmonary disease, unspecified: Secondary | ICD-10-CM

## 2019-01-31 ENCOUNTER — Encounter: Payer: Self-pay | Admitting: Family Medicine

## 2019-02-04 ENCOUNTER — Telehealth: Payer: Self-pay | Admitting: Hematology and Oncology

## 2019-02-04 NOTE — Telephone Encounter (Signed)
Returned patient's phone call regarding rescheduling appointments, due to COVID-19 concerns per patient's request the appointments have been cancelled until patient is ready to reschedule.  Message to provider.

## 2019-02-08 ENCOUNTER — Other Ambulatory Visit: Payer: Self-pay | Admitting: Family Medicine

## 2019-02-08 DIAGNOSIS — K219 Gastro-esophageal reflux disease without esophagitis: Secondary | ICD-10-CM

## 2019-02-11 ENCOUNTER — Ambulatory Visit: Payer: Self-pay

## 2019-02-11 ENCOUNTER — Other Ambulatory Visit: Payer: Self-pay

## 2019-02-11 ENCOUNTER — Ambulatory Visit: Payer: Self-pay | Admitting: Hematology and Oncology

## 2019-03-07 ENCOUNTER — Other Ambulatory Visit: Payer: Self-pay | Admitting: Hematology and Oncology

## 2019-03-10 ENCOUNTER — Other Ambulatory Visit: Payer: Self-pay | Admitting: Hematology and Oncology

## 2019-03-18 MED ORDER — ONDANSETRON HCL 8 MG PO TABS: 8.0000 mg | ORAL_TABLET | Freq: Three times a day (TID) | ORAL | 0 refills | Status: DC | PRN

## 2019-03-25 ENCOUNTER — Ambulatory Visit: Payer: Medicaid Other

## 2019-03-25 ENCOUNTER — Inpatient Hospital Stay (HOSPITAL_BASED_OUTPATIENT_CLINIC_OR_DEPARTMENT_OTHER): Payer: Medicaid Other | Admitting: Hematology and Oncology

## 2019-03-25 ENCOUNTER — Inpatient Hospital Stay: Payer: Medicaid Other | Attending: Hematology and Oncology

## 2019-03-25 ENCOUNTER — Other Ambulatory Visit: Payer: Self-pay

## 2019-03-25 DIAGNOSIS — K909 Intestinal malabsorption, unspecified: Secondary | ICD-10-CM | POA: Diagnosis not present

## 2019-03-25 DIAGNOSIS — F17210 Nicotine dependence, cigarettes, uncomplicated: Secondary | ICD-10-CM | POA: Diagnosis not present

## 2019-03-25 DIAGNOSIS — Z79899 Other long term (current) drug therapy: Secondary | ICD-10-CM | POA: Diagnosis not present

## 2019-03-25 DIAGNOSIS — E610 Copper deficiency: Secondary | ICD-10-CM | POA: Diagnosis not present

## 2019-03-25 DIAGNOSIS — D61818 Other pancytopenia: Secondary | ICD-10-CM | POA: Diagnosis present

## 2019-03-25 DIAGNOSIS — Z9884 Bariatric surgery status: Secondary | ICD-10-CM | POA: Insufficient documentation

## 2019-03-25 DIAGNOSIS — E538 Deficiency of other specified B group vitamins: Secondary | ICD-10-CM | POA: Diagnosis not present

## 2019-03-25 DIAGNOSIS — E559 Vitamin D deficiency, unspecified: Secondary | ICD-10-CM | POA: Insufficient documentation

## 2019-03-25 DIAGNOSIS — D696 Thrombocytopenia, unspecified: Secondary | ICD-10-CM

## 2019-03-25 DIAGNOSIS — Z7951 Long term (current) use of inhaled steroids: Secondary | ICD-10-CM | POA: Insufficient documentation

## 2019-03-25 DIAGNOSIS — F17200 Nicotine dependence, unspecified, uncomplicated: Secondary | ICD-10-CM

## 2019-03-25 LAB — IRON AND TIBC
Iron: 87 ug/dL (ref 41–142)
Saturation Ratios: 16 % — ABNORMAL LOW (ref 21–57)
TIBC: 546 ug/dL — ABNORMAL HIGH (ref 236–444)
UIBC: 459 ug/dL — ABNORMAL HIGH (ref 120–384)

## 2019-03-25 LAB — CBC WITH DIFFERENTIAL/PLATELET
Abs Immature Granulocytes: 0.02 10*3/uL (ref 0.00–0.07)
Basophils Absolute: 0 10*3/uL (ref 0.0–0.1)
Basophils Relative: 1 %
Eosinophils Absolute: 0.1 10*3/uL (ref 0.0–0.5)
Eosinophils Relative: 1 %
HCT: 40.8 % (ref 36.0–46.0)
Hemoglobin: 13.1 g/dL (ref 12.0–15.0)
Immature Granulocytes: 0 %
Lymphocytes Relative: 14 %
Lymphs Abs: 0.7 10*3/uL (ref 0.7–4.0)
MCH: 30.4 pg (ref 26.0–34.0)
MCHC: 32.1 g/dL (ref 30.0–36.0)
MCV: 94.7 fL (ref 80.0–100.0)
Monocytes Absolute: 0.3 10*3/uL (ref 0.1–1.0)
Monocytes Relative: 5 %
Neutro Abs: 4 10*3/uL (ref 1.7–7.7)
Neutrophils Relative %: 79 %
Platelets: 53 10*3/uL — ABNORMAL LOW (ref 150–400)
RBC: 4.31 MIL/uL (ref 3.87–5.11)
RDW: 12.9 % (ref 11.5–15.5)
WBC: 5.1 10*3/uL (ref 4.0–10.5)
nRBC: 0 % (ref 0.0–0.2)

## 2019-03-25 LAB — VITAMIN B12: Vitamin B-12: 376 pg/mL (ref 180–914)

## 2019-03-25 LAB — FERRITIN: Ferritin: 6 ng/mL — ABNORMAL LOW (ref 11–307)

## 2019-03-25 MED ORDER — CYANOCOBALAMIN 1000 MCG/ML IJ SOLN
1000.0000 ug | Freq: Once | INTRAMUSCULAR | Status: AC
  Administered 2019-03-25: 1000 ug via INTRAMUSCULAR

## 2019-03-25 MED ORDER — CYANOCOBALAMIN 1000 MCG/ML IJ SOLN
INTRAMUSCULAR | Status: AC
  Filled 2019-03-25: qty 1

## 2019-03-26 ENCOUNTER — Encounter: Payer: Self-pay | Admitting: Hematology and Oncology

## 2019-03-26 LAB — VITAMIN D 25 HYDROXY (VIT D DEFICIENCY, FRACTURES): Vit D, 25-Hydroxy: 51.8 ng/mL (ref 30.0–100.0)

## 2019-03-26 NOTE — Assessment & Plan Note (Signed)
Her B12 level is stable with current prescription IM injection every 3 to 4 months We will continue the same

## 2019-03-26 NOTE — Assessment & Plan Note (Signed)
Copper level is still pending She is consistent taking copper replacement therapy I will call her once results are available to determine her next appointment

## 2019-03-26 NOTE — Assessment & Plan Note (Signed)
She is taking high-dose vitamin D replacement therapy Her vitamin D level is adequate She will continue the same

## 2019-03-26 NOTE — Assessment & Plan Note (Signed)
Her pancytopenia continues to improve with adequate mineral replacement therapy She will continue B12 injection every 3 to 6 months Copper level is still pending Once I have the results, I will call the patient to determine her next appointment Overall, she felt better with more energy

## 2019-03-26 NOTE — Assessment & Plan Note (Signed)
We discussed the importance of tobacco cessation 

## 2019-03-26 NOTE — Progress Notes (Signed)
[facility] OFFICE PROGRESS NOTE  Lanae Boast, FNP  ASSESSMENT & PLAN:  Other pancytopenia [facility]) Her pancytopenia continues to improve with adequate mineral replacement therapy She will continue B12 injection every 3 to 6 months Copper level is still pending Once I have the results, I will call the patient to determine her next appointment Overall, she felt better with more energy  Vitamin D deficiency She is taking high-dose vitamin D replacement therapy Her vitamin D level is adequate She will continue the same  Vitamin B12 deficiency due to intestinal malabsorption Her B12 level is stable with current prescription IM injection every 3 to 4 months We will continue the same  Dietary copper deficiency Copper level is still pending She is consistent taking copper replacement therapy I will call her once results are available to determine her next appointment  Tobacco use disorder We discussed the importance of tobacco cessation.   No orders of the defined types were placed in this encounter.   INTERVAL HISTORY: Barbara Stanley 48 y.o. female returns for further follow-up for multiple mineral deficiencies She is taking vitamin D, folic acid and copper replacement therapy at home She felt great with overall improvement in energy level She continues to smoke unfortunately No recent infection, fever or chills She denies recent bleeding  SUMMARY OF HEMATOLOGIC HISTORY:  Barbara Stanley  was transferred to my care after her prior physician has left.  I reviewed the patient's records extensive and collaborated the history with the patient. Summary of her history is as follows: The patient is noted to have progressive anemia for the past few years.  Starting around 2018, she was noted to have progressive mild pancytopenia.  Most recently, around October 2019, she is noted to have significant pancytopenia. She was referred to see hematologist for evaluation.  The  patient has history of gastric bypass surgery.  She has significant recent weight loss. Her primary care doctor has ordered multiple mineral panel.  She was noted to have folate, B6, B12, copper as well as iron deficiency.  She has received multiple mineral replacement therapy.  When she was seen here at the hematology clinic, she received high-dose vitamin B12 injection. On August 09, 2018, she underwent ultrasound of her abdomen which showed no evidence of splenomegaly.  The patient has significant smoking history but denies alcohol intake She denies significant recent infection requiring intravenous antibiotic treatment. Infectious work-up was negative. Between October to December 2019, she has received intravenous iron infusion along with vitamin B12 injection. On October 01, 2018, she underwent bone marrow aspirate and biopsy to evaluate for pancytopenia.  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  She was given vitamin B12 injection, oral copper and oral vitamin D replacement therapy.  I have reviewed the past medical history, past surgical history, social history and family history with the patient and they are unchanged from previous note.  ALLERGIES:  is allergic to other.  MEDICATIONS:  Current Outpatient Medications  Medication Sig Dispense Refill  . acetaminophen (TYLENOL) 500 MG tablet Take 1,000 mg by mouth daily as needed for moderate pain.    [name] albuterol (PROAIR HFA) 108 (90 Base) MCG/ACT inhaler Inhale 2 puffs into the lungs every 6 (six) hours as needed for wheezing or shortness of breath. 1 each 5  . budesonide-formoterol (SYMBICORT) 160-4.5 MCG/ACT inhaler Inhale 2 puffs into the lungs 2 (two) times daily. 1 Inhaler 12  . cholecalciferol (  VITAMIN D3) 25 MCG (1000 UT) tablet Take 5,000 Units by mouth daily.    . Copper Gluconate 2 MG CAPS Take 4 mg by mouth daily.    .  Cyanocobalamin (VITAMIN B-12 IJ) Inject 1 mg as directed every 30 (thirty) days.    [name] esomeprazole (NEXIUM) 40 MG capsule TAKE 1 CAPSULE BY MOUTH EVERY DAY 90 capsule 0  . famotidine (PEPCID) 40 MG tablet Take 1 tablet (40 mg total) by mouth daily. 90 tablet 1  . folic acid (FOLVITE) 1 MG tablet Take 1 tablet (1 mg total) by mouth 2 (two) times daily with a meal. 60 tablet 2  . loperamide (IMODIUM) 2 MG capsule Take 1 capsule (2 mg total) by mouth 4 (four) times daily as needed for diarrhea or loose stools. (Patient not taking: Reported on 12/24/2018) 30 capsule 0  . ondansetron (ZOFRAN) 8 MG tablet Take 1 tablet (8 mg total) by mouth every 8 (eight) hours as needed for nausea or vomiting. 60 tablet 0  . tiotropium (SPIRIVA HANDIHALER) 18 MCG inhalation capsule INHALE THE CONTENTS OF 1 CAPSULE VIA INHALATION DEVICE DAILY 30 capsule 5  . traZODone (DESYREL) 150 MG tablet Take 1 tablet (150 mg total) by mouth at bedtime. For sleep 30 tablet 0  . venlafaxine XR (EFFEXOR-XR) 75 MG 24 hr capsule Take 1 capsule (75 mg total) by mouth daily with breakfast. For depression 30 capsule 0   No current facility-administered medications for this visit.      REVIEW OF SYSTEMS:   Constitutional: Denies fevers, chills or night sweats Eyes: Denies blurriness of vision Ears, nose, mouth, throat, and face: Denies mucositis or sore throat Respiratory: Denies cough, dyspnea or wheezes Cardiovascular: Denies palpitation, chest discomfort or lower extremity swelling Gastrointestinal:  Denies nausea, heartburn or change in bowel habits Skin: Denies abnormal skin rashes Lymphatics: Denies new lymphadenopathy or easy bruising Neurological:Denies numbness, tingling or new weaknesses Behavioral/Psych: Mood is stable, no new changes  All other systems were reviewed with the patient and are negative.  PHYSICAL EXAMINATION: ECOG PERFORMANCE STATUS: 1 - Symptomatic but completely ambulatory  Vitals:   03/25/19 1220  BP:  113/82  Pulse: 70  Resp: 18  Temp: 98.2 F (36.8 C)  SpO2: 95%   Filed Weights   03/25/19 1220  Weight: 200 lb 12.8 oz (91.1 kg)    GENERAL:alert, no distress and comfortable NEURO: alert & oriented x 3 with fluent speech, no focal motor/sensory deficits  LABORATORY DATA:  I have reviewed the data as listed     Component Value Date/Time   NA 142 09/21/2018 1000   NA 144 08/01/2018 1154   K 3.9 09/21/2018 1000   CL 108 09/21/2018 1000   CO2 25 09/21/2018 1000   GLUCOSE 74 09/21/2018 1000   BUN 7 09/21/2018 1000   BUN 10 08/01/2018 1154   CREATININE 0.70 09/21/2018 1000   CREATININE 0.59 08/07/2017 1410   CALCIUM 8.6 (L) 09/21/2018 1000   PROT 5.2 (L) 09/06/2018 1459   PROT 5.0 (L) 08/01/2018 1154   ALBUMIN 3.1 (L) 09/06/2018 1459   ALBUMIN 3.3 (L) 08/01/2018 1154   AST 16 09/06/2018 1459   ALT 14 09/06/2018 1459   ALKPHOS 83 09/06/2018 1459   BILITOT 1.3 (H) 09/06/2018 1459   BILITOT 0.5 08/01/2018 1154   GFRNONAA >60 09/21/2018 1000   GFRNONAA 110 08/07/2017 1410   GFRAA >60 09/21/2018 1000   GFRAA 127 08/07/2017 1410    No results found for: SPEP, UPEP  Lab  Results  Component Value Date   WBC 5.1 03/25/2019   NEUTROABS 4.0 03/25/2019   HGB 13.1 03/25/2019   HCT 40.8 03/25/2019   MCV 94.7 03/25/2019   PLT 53 (L) 03/25/2019      Chemistry      Component Value Date/Time   NA 142 09/21/2018 1000   NA 144 08/01/2018 1154   K 3.9 09/21/2018 1000   CL 108 09/21/2018 1000   CO2 25 09/21/2018 1000   BUN 7 09/21/2018 1000   BUN 10 08/01/2018 1154   CREATININE 0.70 09/21/2018 1000   CREATININE 0.59 08/07/2017 1410      Component Value Date/Time   CALCIUM 8.6 (L) 09/21/2018 1000   ALKPHOS 83 09/06/2018 1459   AST 16 09/06/2018 1459   ALT 14 09/06/2018 1459   BILITOT 1.3 (H) 09/06/2018 1459   BILITOT 0.5 08/01/2018 1154      I spent 15 minutes counseling the patient face to face. The total time spent in the appointment was 20 minutes and more than  50% was on counseling.   All questions were answered. The patient knows to call the clinic with any problems, questions or concerns. No barriers to learning was detected.    Heath Lark, MD 6/9/20207:58 AM

## 2019-03-27 ENCOUNTER — Ambulatory Visit: Payer: Self-pay | Admitting: Family Medicine

## 2019-03-29 ENCOUNTER — Telehealth: Payer: Self-pay | Admitting: *Deleted

## 2019-03-29 LAB — COPPER, SERUM: Copper: 100 ug/dL (ref 72–166)

## 2019-03-29 NOTE — Telephone Encounter (Signed)
-----   Message from Heath Lark, MD sent at 03/29/2019  7:43 AM EDT ----- Regarding: test results Pls call and let her know the rest of her labs are back 1) Copper: level is good. Continue 2 mg daily 2) B12: stable. Next injection in 4 months 3) Iron studies: iron deficient but since she is not anemic, no need IV iron replacement 4) Vitamin D level is good. Continue vitamin D supplement  If she agrees, I recommend see me in 4 months, same day injection, labs to be drawn 1 week prior to appt. Please send scheduling msg

## 2019-03-29 NOTE — Telephone Encounter (Signed)
Telephone call to patient and advised lab results as directed below. Scheduling message sent. Patient verbalized understanding and agrees with plan.

## 2019-04-02 ENCOUNTER — Telehealth: Payer: Self-pay | Admitting: Hematology and Oncology

## 2019-04-02 ENCOUNTER — Telehealth: Payer: Self-pay

## 2019-04-02 NOTE — Telephone Encounter (Signed)
Left message re October appointments. Schedule mailed.

## 2019-04-02 NOTE — Telephone Encounter (Signed)
Called to do COVID Screening for appointment tomorrow. No answer. Left a message to call back. Thanks! 

## 2019-04-03 ENCOUNTER — Other Ambulatory Visit: Payer: Self-pay

## 2019-04-03 ENCOUNTER — Encounter: Payer: Self-pay | Admitting: Family Medicine

## 2019-04-03 ENCOUNTER — Ambulatory Visit (INDEPENDENT_AMBULATORY_CARE_PROVIDER_SITE_OTHER): Payer: Medicaid Other | Admitting: Family Medicine

## 2019-04-03 VITALS — BP 119/73 | HR 73 | Temp 98.9°F | Resp 18 | Ht 68.0 in | Wt 209.0 lb

## 2019-04-03 DIAGNOSIS — J449 Chronic obstructive pulmonary disease, unspecified: Secondary | ICD-10-CM

## 2019-04-03 DIAGNOSIS — D61818 Other pancytopenia: Secondary | ICD-10-CM | POA: Diagnosis not present

## 2019-04-03 DIAGNOSIS — K219 Gastro-esophageal reflux disease without esophagitis: Secondary | ICD-10-CM

## 2019-04-03 NOTE — Progress Notes (Signed)
[facility]   Progress Note: General Provider: Lanae Boast, FNP  SUBJECTIVE:   Barbara Stanley is a 48 y.o. female who  has a past medical history of Anemia, COPD (chronic obstructive pulmonary disease) ([place]), Depression, Emphysema, GERD (gastroesophageal reflux disease), and Tobacco abuse.. Patient presents today for COPD (inhalers not helping as much as they should )  .COPD She complains of shortness of breath, sputum production and wheezing. This is a chronic problem. The current episode started more than 1 year ago. The problem occurs intermittently. The problem has been unchanged. Associated symptoms include dyspnea on exertion. Pertinent negatives include no postnasal drip, sneezing or sore throat. Her symptoms are aggravated by nothing. Risk factors for lung disease include smoking/tobacco exposure. Her past medical history is significant for COPD.  Patient continues to smoke and is not currently interested in quitting.   Review of Systems  Constitutional: Negative.   HENT: Negative.  Negative for postnasal drip, sneezing and sore throat.   Eyes: Negative.   Respiratory: Positive for sputum production, shortness of breath and wheezing.   Cardiovascular: Positive for dyspnea on exertion.  Gastrointestinal: Negative.   Genitourinary: Negative.   Musculoskeletal: Negative.   Skin: Negative.   Neurological: Negative.   Psychiatric/Behavioral: Negative.      OBJECTIVE: BP 119/73 (BP Location: Right Arm, Patient Position: Sitting, Cuff Size: Normal)   Pulse 73   Temp 98.9 F (37.2 C) (Oral)   Resp 18   Ht 5\' 8"  (1.727 m)   Wt 209 lb (94.8 kg)   SpO2 97%   BMI 31.78 kg/m   Wt Readings from Last 3 Encounters:  04/03/19 209 lb (94.8 kg)  03/25/19 200 lb 12.8 oz (91.1 kg)  12/24/18 194 lb (88 kg)     Physical Exam Vitals signs and nursing note reviewed.  Constitutional:      General: She is not in acute distress.  Appearance: Normal appearance.  HENT:     Head: Normocephalic and atraumatic.  Eyes:     Extraocular Movements: Extraocular movements intact.     Conjunctiva/sclera: Conjunctivae normal.     Pupils: Pupils are equal, round, and reactive to light.  Cardiovascular:     Rate and Rhythm: Normal rate and regular rhythm.     Heart sounds: No murmur.  Pulmonary:     Effort: Pulmonary effort is normal.     Breath sounds: Normal breath sounds.  Musculoskeletal: Normal range of motion.  Skin:    General: Skin is warm and dry.  Neurological:     Mental Status: She is alert and oriented to person, place, and time.  Psychiatric:        Mood and Affect: Mood normal.        Behavior: Behavior normal.        Thought Content: Thought content normal.        Judgment: Judgment normal.     ASSESSMENT/PLAN: 1. Chronic obstructive pulmonary disease, unspecified COPD type ([place]) Referred to Pulmonology. Advised smoking cessation. Smoking cessation instruction/counseling given:  counseled patient on the dangers of tobacco use, advised patient to stop smoking, and reviewed strategies to maximize success  - Ambulatory referral to Pulmonology  2. Gastroesophageal reflux disease, esophagitis presence not specified No medication changes warranted at the present time.    3. Other pancytopenia ([place]) Patient is being followed by a specialist for this condition. Patient advised to continue with follow up appointments. PCP will continue to monitor progress.  Return in about 3 months (around 07/04/2019).    The patient was given clear instructions to go to ER or return to medical center if symptoms do not improve, worsen or new problems develop. The patient verbalized understanding and agreed with plan of care.   [name], FNP-BC Patient [facility] [address] [place], [place] [address] [phone]

## 2019-04-03 NOTE — Patient Instructions (Signed)
Coping with Quitting Smoking  Quitting smoking is a physical and mental challenge. You will face cravings, withdrawal symptoms, and temptation. Before quitting, work with your health care provider to make a plan that can help you cope. Preparation can help you quit and keep you from giving in. How can I cope with cravings? Cravings usually last for 5-10 minutes. If you get through it, the craving will pass. Consider taking the following actions to help you cope with cravings:  Keep your mouth busy: ? Chew sugar-free gum. ? Suck on hard candies or a straw. ? Brush your teeth.  Keep your hands and body busy: ? Immediately change to a different activity when you feel a craving. ? Squeeze or play with a ball. ? Do an activity or a hobby, like making bead jewelry, practicing needlepoint, or working with wood. ? Mix up your normal routine. ? Take a short exercise break. Go for a quick walk or run up and down stairs. ? Spend time in public places where smoking is not allowed.  Focus on doing something kind or helpful for someone else.  Call a friend or family member to talk during a craving.  Join a support group.  Call a quit line, such as 1-800-QUIT-NOW.  Talk with your health care provider about medicines that might help you cope with cravings and make quitting easier for you. How can I deal with withdrawal symptoms? Your body may experience negative effects as it tries to get used to not having nicotine in the system. These effects are called withdrawal symptoms. They may include:  Feeling hungrier than normal.  Trouble concentrating.  Irritability.  Trouble sleeping.  Feeling depressed.  Restlessness and agitation.  Craving a cigarette. To manage withdrawal symptoms:  Avoid places, people, and activities that trigger your cravings.  Remember why you want to quit.  Get plenty of sleep.  Avoid coffee and other caffeinated drinks. These may worsen some of your  symptoms. How can I handle social situations? Social situations can be difficult when you are quitting smoking, especially in the first few weeks. To manage this, you can:  Avoid parties, bars, and other social situations where people might be smoking.  Avoid alcohol.  Leave right away if you have the urge to smoke.  Explain to your family and friends that you are quitting smoking. Ask for understanding and support.  Plan activities with friends or family where smoking is not an option. What are some ways I can cope with stress? Wanting to smoke may cause stress, and stress can make you want to smoke. Find ways to manage your stress. Relaxation techniques can help. For example:  Breathe slowly and deeply, in through your nose and out through your mouth.  Listen to soothing, relaxing music.  Talk with a family member or friend about your stress.  Light a candle.  Soak in a bath or take a shower.  Think about a peaceful place. What are some ways I can prevent weight gain? Be aware that many people gain weight after they quit smoking. However, not everyone does. To keep from gaining weight, have a plan in place before you quit and stick to the plan after you quit. Your plan should include:  Having healthy snacks. When you have a craving, it may help to: ? Eat plain popcorn, crunchy carrots, celery, or other cut vegetables. ? Chew sugar-free gum.  Changing how you eat: ? Eat small portion sizes at meals. ? Eat 4-6 small meals  throughout the day instead of 1-2 large meals a day. ? Be mindful when you eat. Do not watch television or do other things that might distract you as you eat.  Exercising regularly: ? Make time to exercise each day. If you do not have time for a long workout, do short bouts of exercise for 5-10 minutes several times a day. ? Do some form of strengthening exercise, like weight lifting, and some form of aerobic exercise, like running or swimming.  Drinking  plenty of water or other low-calorie or no-calorie drinks. Drink 6-8 glasses of water daily, or as much as instructed by your health care provider. Summary  Quitting smoking is a physical and mental challenge. You will face cravings, withdrawal symptoms, and temptation to smoke again. Preparation can help you as you go through these challenges.  You can cope with cravings by keeping your mouth busy (such as by chewing gum), keeping your body and hands busy, and making calls to family, friends, or a helpline for people who want to quit smoking.  You can cope with withdrawal symptoms by avoiding places where people smoke, avoiding drinks with caffeine, and getting plenty of rest.  Ask your health care provider about the different ways to prevent weight gain, avoid stress, and handle social situations. This information is not intended to replace advice given to you by your health care provider. Make sure you discuss any questions you have with your health care provider. Document Released: 09/30/2016 Document Revised: 09/30/2016 Document Reviewed: 09/30/2016 Elsevier Interactive Patient Education  2019 [employer]. Chronic Obstructive Pulmonary Disease Chronic obstructive pulmonary disease (COPD) is a long-term (chronic) lung problem. When you have COPD, it is hard for air to get in and out of your lungs. Usually the condition gets worse over time, and your lungs will never return to normal. There are things you can do to keep yourself as healthy as possible.  Your doctor may treat your condition with: ? Medicines. ? Oxygen. ? Lung surgery.  Your doctor may also recommend: ? Rehabilitation. This includes steps to make your body work better. It may involve a team of specialists. ? Quitting smoking, if you smoke. ? Exercise and changes to your diet. ? Comfort measures (palliative care). Follow these instructions at home: Medicines  Take over-the-counter and prescription medicines only as  told by your doctor.  Talk to your doctor before taking any cough or allergy medicines. You may need to avoid medicines that cause your lungs to be dry. Lifestyle  If you smoke, stop. Smoking makes the problem worse. If you need help quitting, ask your doctor.  Avoid being around things that make your breathing worse. This may include smoke, chemicals, and fumes.  Stay active, but remember to rest as well.  Learn and use tips on how to relax.  Make sure you get enough sleep. Most adults need at least 7 hours of sleep every night.  Eat healthy foods. Eat smaller meals more often. Rest before meals. Controlled breathing Learn and use tips on how to control your breathing as told by your doctor. Try:  Breathing in (inhaling) through your nose for 1 second. Then, pucker your lips and breath out (exhale) through your lips for 2 seconds.  Putting one hand on your belly (abdomen). Breathe in slowly through your nose for 1 second. Your hand on your belly should move out. Pucker your lips and breathe out slowly through your lips. Your hand on your belly should move in as you  breathe out.  Controlled coughing Learn and use controlled coughing to clear mucus from your lungs. Follow these steps: 1. Lean your head a little forward. 2. Breathe in deeply. 3. Try to hold your breath for 3 seconds. 4. Keep your mouth slightly open while coughing 2 times. 5. Spit any mucus out into a tissue. 6. Rest and do the steps again 1 or 2 times as needed. General instructions  Make sure you get all the shots (vaccines) that your doctor recommends. Ask your doctor about a flu shot and a pneumonia shot.  Use oxygen therapy and pulmonary rehabilitation if told by your doctor. If you need home oxygen therapy, ask your doctor if you should buy a tool to measure your oxygen level (oximeter).  Make a COPD action plan with your doctor. This helps you to know what to do if you feel worse than usual.  Manage any  other conditions you have as told by your doctor.  Avoid going outside when it is very hot, cold, or humid.  Avoid people who have a sickness you can catch (contagious).  Keep all follow-up visits as told by your doctor. This is important. Contact a doctor if:  You cough up more mucus than usual.  There is a change in the color or thickness of the mucus.  It is harder to breathe than usual.  Your breathing is faster than usual.  You have trouble sleeping.  You need to use your medicines more often than usual.  You have trouble doing your normal activities such as getting dressed or walking around the house. Get help right away if:  You have shortness of breath while resting.  You have shortness of breath that stops you from: ? Being able to talk. ? Doing normal activities.  Your chest hurts for longer than 5 minutes.  Your skin color is more blue than usual.  Your pulse oximeter shows that you have low oxygen for longer than 5 minutes.  You have a fever.  You feel too tired to breathe normally. Summary  Chronic obstructive pulmonary disease (COPD) is a long-term lung problem.  The way your lungs work will never return to normal. Usually the condition gets worse over time. There are things you can do to keep yourself as healthy as possible.  Take over-the-counter and prescription medicines only as told by your doctor.  If you smoke, stop. Smoking makes the problem worse. This information is not intended to replace advice given to you by your health care provider. Make sure you discuss any questions you have with your health care provider. Document Released: 03/21/2008 Document Revised: 11/07/2016 Document Reviewed: 11/07/2016 Elsevier Interactive Patient Education  2019 [employer].

## 2019-04-24 ENCOUNTER — Encounter: Payer: Self-pay | Admitting: Family Medicine

## 2019-05-07 ENCOUNTER — Other Ambulatory Visit: Payer: Self-pay | Admitting: Family Medicine

## 2019-05-07 ENCOUNTER — Other Ambulatory Visit: Payer: Self-pay

## 2019-05-07 DIAGNOSIS — J449 Chronic obstructive pulmonary disease, unspecified: Secondary | ICD-10-CM

## 2019-05-07 DIAGNOSIS — K219 Gastro-esophageal reflux disease without esophagitis: Secondary | ICD-10-CM

## 2019-05-07 MED ORDER — ALBUTEROL SULFATE HFA 108 (90 BASE) MCG/ACT IN AERS: 2.0000 | INHALATION_SPRAY | Freq: Four times a day (QID) | RESPIRATORY_TRACT | 3 refills | Status: DC | PRN

## 2019-05-17 ENCOUNTER — Encounter: Payer: Self-pay | Admitting: Pulmonary Disease

## 2019-05-21 ENCOUNTER — Other Ambulatory Visit: Payer: Self-pay | Admitting: Hematology and Oncology

## 2019-05-21 NOTE — Telephone Encounter (Signed)
pls advise patient future refill through primary doctor

## 2019-05-29 NOTE — Progress Notes (Signed)
Synopsis: Referred in Aug 2020 for COPD by Lanae Boast, FNP  Subjective:   PATIENT ID: Barbara Stanley GENDER: female DOB: [birth year], MRN: [id number]  Chief Complaint  Patient presents with  . Consult    States she was told she has COPD, Chronic Bronchitis, and Emphysema. She states she cannot take a deep breath. She smokes daily. Productive cough with clear phleghm.     48 year old female past medical history of COPD depression emphysema gastroesophageal reflux disease tobacco abuse. 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 Patient was referred to pulmonary at her last primary care visit.  She was also advised to quit smoking.  Longtime smoker.  No prior pulmonary function tests. She was told in the past that she has chronic bronchitis. She used Symbicort and Spiriva HandiHaler. Smoked since age 51, continues to smoke 1 ppd. She has always had difficult quitting. She has used Wellbutrin in the past. She has mental health issues and feels uncomfortable with using chantix.  She still has significant respiratory symptoms with exertion.  She also notices heat and humidity makes her symptoms worse.  She does have nocturnal wheezing and shortness of breath.   Past Medical History:  Diagnosis Date  . Anemia   . COPD (chronic obstructive pulmonary disease) ([place])   . Depression   . Emphysema   . GERD (gastroesophageal reflux disease)   . Tobacco abuse      Family History  Problem Relation Age of Onset  . Diabetes Mother   . Diabetes Other   . Hypertension Other   . CAD Other      Past Surgical History:  Procedure Laterality Date  . ABDOMINAL HYSTERECTOMY    . c section x 3     . CHOLECYSTECTOMY    . GASTRIC BYPASS      Social History   Socioeconomic History  . Marital status: Single    Spouse name: Not on file  . Number of children: Not on file  . Years of education: Not on file  . Highest education level: Not on file  Occupational History  . Not on file  Social Needs  . Financial  resource strain: Not on file  . Food insecurity    Worry: Not on file    Inability: Not on file  . Transportation needs    Medical: Not on file    Non-medical: Not on file  Tobacco Use  . Smoking status: Current Every Day Smoker    Packs/day: 1.00    Years: 1.00    Pack years: 1.00    Types: Cigarettes  . Smokeless tobacco: Never Used  Substance and Sexual Activity  . Alcohol use: No    Comment: occas  . Drug use: No  . Sexual activity: Yes    Birth control/protection: None  Lifestyle  . Physical activity    Days per week: Not on file    Minutes per session: Not on file  . Stress: Not on file  Relationships  . Social Herbalist on phone: Not on file    Gets together: Not on file    Attends religious service: Not on file    Active member of club or organization: Not on file    Attends meetings of clubs or organizations: Not on file    Relationship status: Not on file  . Intimate partner violence    Fear of current or ex partner: Not on file    Emotionally abused: Not on file    Physically  abused: Not on file    Forced sexual activity: Not on file  Other Topics Concern  . Not on file  Social History Narrative  . Not on file     Allergies  Allergen Reactions  . Other     "Banana Bag" given to patient at physician's office     Outpatient Medications Prior to Visit  Medication Sig Dispense Refill  . acetaminophen (TYLENOL) 500 MG tablet Take 1,000 mg by mouth daily as needed for moderate pain.    [name] albuterol (PROAIR HFA) 108 (90 Base) MCG/ACT inhaler Inhale 2 puffs into the lungs every 6 (six) hours as needed for wheezing or shortness of breath. 17 g 3  . budesonide-formoterol (SYMBICORT) 160-4.5 MCG/ACT inhaler Inhale 2 puffs into the lungs 2 (two) times daily. 1 Inhaler 12  . cholecalciferol (VITAMIN D3) 25 MCG (1000 UT) tablet Take 5,000 Units by mouth daily.    . Copper Gluconate 2 MG CAPS Take 4 mg by mouth daily.    . Cyanocobalamin (VITAMIN B-12  IJ) Inject 1 mg as directed every 30 (thirty) days.    [name] esomeprazole (NEXIUM) 40 MG capsule TAKE 1 CAPSULE BY MOUTH EVERY DAY 90 capsule 0  . famotidine (PEPCID) 40 MG tablet Take 1 tablet (40 mg total) by mouth daily. 90 tablet 1  . folic acid (FOLVITE) 1 MG tablet Take 1 tablet (1 mg total) by mouth 2 (two) times daily with a meal. 60 tablet 2  . tiotropium (SPIRIVA HANDIHALER) 18 MCG inhalation capsule INHALE THE CONTENTS OF 1 CAPSULE VIA INHALATION DEVICE DAILY 30 capsule 5  . traZODone (DESYREL) 150 MG tablet Take 1 tablet (150 mg total) by mouth at bedtime. For sleep 30 tablet 0  . venlafaxine XR (EFFEXOR-XR) 75 MG 24 hr capsule Take 1 capsule (75 mg total) by mouth daily with breakfast. For depression 30 capsule 0  . ondansetron (ZOFRAN) 8 MG tablet TAKE 1 TABLET(8 MG) BY MOUTH EVERY 8 HOURS AS NEEDED FOR NAUSEA OR VOMITING (Patient not taking: Reported on 05/30/2019) 30 tablet 0   No facility-administered medications prior to visit.     Review of Systems  Constitutional: Negative for chills, fever, malaise/fatigue and weight loss.  HENT: Negative for hearing loss, sore throat and tinnitus.   Eyes: Negative for blurred vision and double vision.  Respiratory: Positive for cough, sputum production and shortness of breath. Negative for hemoptysis, wheezing and stridor.   Cardiovascular: Negative for chest pain, palpitations, orthopnea, leg swelling and PND.  Gastrointestinal: Negative for abdominal pain, constipation, diarrhea, heartburn, nausea and vomiting.  Genitourinary: Negative for dysuria, hematuria and urgency.  Musculoskeletal: Negative for joint pain and myalgias.  Skin: Negative for itching and rash.  Neurological: Negative for dizziness, tingling, weakness and headaches.  Endo/Heme/Allergies: Negative for environmental allergies. Does not bruise/bleed easily.  Psychiatric/Behavioral: Negative for depression. The patient is not nervous/anxious and does not have insomnia.   All  other systems reviewed and are negative.   Objective:  Physical Exam Vitals signs reviewed.  Constitutional:      General: She is not in acute distress.    Appearance: She is well-developed.  HENT:     Head: Normocephalic and atraumatic.     Mouth/Throat:     Pharynx: No oropharyngeal exudate.  Eyes:     Conjunctiva/sclera: Conjunctivae normal.     Pupils: Pupils are equal, round, and reactive to light.  Neck:     Vascular: No JVD.     Trachea: No tracheal deviation.  Cardiovascular:     Rate and Rhythm: Normal rate and regular rhythm.     Heart sounds: S1 normal and S2 normal.     Comments: Distant heart tones Pulmonary:     Effort: No tachypnea or accessory muscle usage.     Breath sounds: No stridor. Decreased breath sounds (throughout all lung fields) present. No wheezing, rhonchi or rales.  Abdominal:     General: Bowel sounds are normal. There is no distension.     Palpations: Abdomen is soft.     Tenderness: There is no abdominal tenderness.  Musculoskeletal:        General: Deformity (muscle wasting ) present.  Skin:    General: Skin is warm and dry.     Capillary Refill: Capillary refill takes less than 2 seconds.     Findings: No rash.  Neurological:     Mental Status: She is alert and oriented to person, place, and time.  Psychiatric:        Behavior: Behavior normal.      Vitals:   05/30/19 1022  BP: 120/82  Pulse: 65  Temp: 98.2 F (36.8 C)  TempSrc: Oral  SpO2: 94%  Weight: 202 lb 3.2 oz (91.7 kg)  Height: 5\' 8"  (1.727 m)   94% on RA BMI Readings from Last 3 Encounters:  05/30/19 30.74 kg/m  04/03/19 31.78 kg/m  03/25/19 30.53 kg/m   Wt Readings from Last 3 Encounters:  05/30/19 202 lb 3.2 oz (91.7 kg)  04/03/19 209 lb (94.8 kg)  03/25/19 200 lb 12.8 oz (91.1 kg)     CBC    Component Value Date/Time   WBC 5.1 03/25/2019 1207   RBC 4.31 03/25/2019 1207   HGB 13.1 03/25/2019 1207   HGB 11.7 (L) 09/21/2018 1000   HGB 10.3 (L)  08/01/2018 1154   HCT 40.8 03/25/2019 1207   HCT 32.9 (L) 08/01/2018 1154   PLT 53 (L) 03/25/2019 1207   PLT 15 (L) 09/21/2018 1000   PLT 37 (LL) 08/01/2018 1154   MCV 94.7 03/25/2019 1207   MCV 83 08/01/2018 1154   MCH 30.4 03/25/2019 1207   MCHC 32.1 03/25/2019 1207   RDW 12.9 03/25/2019 1207   RDW 15.4 08/01/2018 1154   LYMPHSABS 0.7 03/25/2019 1207   LYMPHSABS 0.9 08/01/2018 1154   MONOABS 0.3 03/25/2019 1207   EOSABS 0.1 03/25/2019 1207   EOSABS 0.0 08/01/2018 1154   BASOSABS 0.0 03/25/2019 1207   BASOSABS 0.0 08/01/2018 1154     Chest Imaging: 08/07/2017 chest x-ray: Bronchiectatic changes. The patient's images have been independently reviewed by me.    Pulmonary Functions Testing Results: No flowsheet data found.  FeNO: None   Pathology: None   Echocardiogram: None   Heart Catheterization: None     Assessment & Plan:     ICD-10-CM   1. SOB (shortness of breath)  R06.02 Pulmonary Function Test  2. Current smoker  F17.200 Pulmonary Function Test    Discussion:  This is a 48 year old female with a significant smoking history, 40+-pack-year history of smoking at her highest was smoking 2 packs a day down to 1 pack/day now.  She likely has moderate COPD.  She has never had pulmonary function test.  She is currently managed on triple therapy.  Her medications are covered by her insurance. She does not really like using the Spiriva HandiHaler.  She does not feel like she gets enough of the medication out of the dispenser.  We will trial her on Trelegy.  We will have any Trelegy samples in the office but the combination of Breo plus Incruse are the same medications.  Therefore we will give her samples of this to see if she likes it and is breathing any better on it.  If so we have already sent a prescription to of Trelegy to her pharmacy. We will also get her a nebulizer with PRN albuterol. Continue albuterol as needed for shortness of breath and wheezing. Patient  really needs to quit smoking.  We discussed various methods today in the office to smoking cessation.  I think the tapering method works the best.  We talked about getting 7 containers labeling those Monday through Sunday.  Giving her a dispensed amount of cigarettes per day.  Each week decreasing the number of cigarettes per day about 2.  Also with the use of lozenges and nicotine patches will help reduce cravings.  Greater than 50% of this patient's 45-minute of visit was been face-to-face discussing above recommendations and treatment plan.     Current Outpatient Medications:  .  acetaminophen (TYLENOL) 500 MG tablet, Take 1,000 mg by mouth daily as needed for moderate pain., Disp: , Rfl:  .  albuterol (PROAIR HFA) 108 (90 Base) MCG/ACT inhaler, Inhale 2 puffs into the lungs every 6 (six) hours as needed for wheezing or shortness of breath., Disp: 17 g, Rfl: 3 .  budesonide-formoterol (SYMBICORT) 160-4.5 MCG/ACT inhaler, Inhale 2 puffs into the lungs 2 (two) times daily., Disp: 1 Inhaler, Rfl: 12 .  cholecalciferol (VITAMIN D3) 25 MCG (1000 UT) tablet, Take 5,000 Units by mouth daily., Disp: , Rfl:  .  Copper Gluconate 2 MG CAPS, Take 4 mg by mouth daily., Disp: , Rfl:  .  Cyanocobalamin (VITAMIN B-12 IJ), Inject 1 mg as directed every 30 (thirty) days., Disp: , Rfl:  .  esomeprazole (NEXIUM) 40 MG capsule, TAKE 1 CAPSULE BY MOUTH EVERY DAY, Disp: 90 capsule, Rfl: 0 .  famotidine (PEPCID) 40 MG tablet, Take 1 tablet (40 mg total) by mouth daily., Disp: 90 tablet, Rfl: 1 .  folic acid (FOLVITE) 1 MG tablet, Take 1 tablet (1 mg total) by mouth 2 (two) times daily with a meal., Disp: 60 tablet, Rfl: 2 .  tiotropium (SPIRIVA HANDIHALER) 18 MCG inhalation capsule, INHALE THE CONTENTS OF 1 CAPSULE VIA INHALATION DEVICE DAILY, Disp: 30 capsule, Rfl: 5 .  traZODone (DESYREL) 150 MG tablet, Take 1 tablet (150 mg total) by mouth at bedtime. For sleep, Disp: 30 tablet, Rfl: 0 .  venlafaxine XR  (EFFEXOR-XR) 75 MG 24 hr capsule, Take 1 capsule (75 mg total) by mouth daily with breakfast. For depression, Disp: 30 capsule, Rfl: 0   Garner Nash, DO [facility] 05/30/2019 10:40 AM

## 2019-05-30 ENCOUNTER — Encounter: Payer: Self-pay | Admitting: Pulmonary Disease

## 2019-05-30 ENCOUNTER — Telehealth: Payer: Self-pay | Admitting: Pulmonary Disease

## 2019-05-30 ENCOUNTER — Ambulatory Visit (INDEPENDENT_AMBULATORY_CARE_PROVIDER_SITE_OTHER): Payer: Medicaid Other | Admitting: Pulmonary Disease

## 2019-05-30 ENCOUNTER — Other Ambulatory Visit: Payer: Self-pay

## 2019-05-30 VITALS — BP 120/82 | HR 65 | Temp 98.2°F | Ht 68.0 in | Wt 202.2 lb

## 2019-05-30 DIAGNOSIS — R0602 Shortness of breath: Secondary | ICD-10-CM | POA: Diagnosis not present

## 2019-05-30 DIAGNOSIS — J449 Chronic obstructive pulmonary disease, unspecified: Secondary | ICD-10-CM

## 2019-05-30 DIAGNOSIS — F17200 Nicotine dependence, unspecified, uncomplicated: Secondary | ICD-10-CM | POA: Diagnosis not present

## 2019-05-30 MED ORDER — INCRUSE ELLIPTA 62.5 MCG/INH IN AEPB: 1.0000 | INHALATION_SPRAY | Freq: Every day | RESPIRATORY_TRACT | 0 refills | Status: DC

## 2019-05-30 MED ORDER — TRELEGY ELLIPTA 100-62.5-25 MCG/INH IN AEPB: 1.0000 | INHALATION_SPRAY | Freq: Once | RESPIRATORY_TRACT | 6 refills | Status: AC

## 2019-05-30 MED ORDER — ALBUTEROL SULFATE (2.5 MG/3ML) 0.083% IN NEBU: 2.5000 mg | INHALATION_SOLUTION | RESPIRATORY_TRACT | 5 refills | Status: DC | PRN

## 2019-05-30 MED ORDER — BREO ELLIPTA 100-25 MCG/INH IN AEPB: 1.0000 | INHALATION_SPRAY | Freq: Every day | RESPIRATORY_TRACT | 0 refills | Status: DC

## 2019-05-30 NOTE — Progress Notes (Signed)
trelegy Patient seen in the office today and instructed on use of Incruse and Breo.  Patient expressed understanding and demonstrated technique.

## 2019-05-30 NOTE — Patient Instructions (Addendum)
Thank you for visiting Dr. Valeta Harms at [facility]. Today we recommend the following:  Orders Placed This Encounter  Procedures  . Ambulatory Referral for DME  . Pulmonary Function Test   Meds ordered this encounter  Medications  . fluticasone furoate-vilanterol (BREO ELLIPTA) 100-25 MCG/INH AEPB    Sig: Inhale 1 puff into the lungs daily.    Dispense:  1 each    Refill:  0    Order Specific Question:   Lot Number?    Answer:   [id number]    Order Specific Question:   Manufacturer?    Answer:   Gl[employer]oSmith[employer] [[id number]]  . umeclidinium bromide (INCRUSE ELLIPTA) 62.5 MCG/INH AEPB    Sig: Inhale 1 puff into the lungs daily.    Dispense:  2 each    Refill:  0    Order Specific Question:   Lot Number?    Answer:   [id number]    Order Specific Question:   Manufacturer?    Answer:   [employer]oSmith[employer] [[id number]]  . Fluticasone-Umeclidin-Vilant (TRELEGY ELLIPTA) 100-62.5-25 MCG/INH AEPB    Sig: Inhale 1 puff into the lungs once for 1 dose.    Dispense:  1 each    Refill:  6  . albuterol (PROVENTIL) (2.5 MG/3ML) 0.083% nebulizer solution    Sig: Take 3 mLs (2.5 mg total) by nebulization every 4 (four) hours as needed for wheezing or shortness of breath.    Dispense:  75 mL    Refill:  5   Return in about 4 weeks (around 06/27/2019) for with APP or Dr. Valeta Harms.  You must quit smoking or vaping. This is the single most important thing that you can do to improve your lung health.   S = Set a quit date. T = Tell family, friends, and the people around you that you plan to quit. A = Anticipate or plan ahead for the tough times you'll face while quitting. R = Remove cigarettes and other tobacco products from your home, car, and work T = Talk to [place] about getting help to quit  If you need help feel free to reach out to our office, [place] [facility]: [phone], call 1-800-QUIT-NOW, or visit [email or web address]  Consider tapering with patches and lozenges    Please do your  part to reduce the spread of COVID-19.

## 2019-05-30 NOTE — Addendum Note (Signed)
Addended by: Vivia Ewing on: 05/30/2019 02:50 PM   Modules accepted: Orders

## 2019-05-30 NOTE — Telephone Encounter (Signed)
Attempted to call. Placed on hold due to 'higher than normal call volumes".  Did not get through to anyone today. Will leave encounter open for follow up.

## 2019-05-31 NOTE — Telephone Encounter (Signed)
Spoke with pharmacist and she stated they had to transfer her prescription to another pharmacy due to her insurance not being accepted. She stated thay transferred it to [facility]. I called [facility] and they stated that they had the RX and that it went through her insurance. Nothing further is needed.   albuterol (PROVENTIL) (2.5 MG/3ML) 0.083% nebulizer solution [[id number]]   Order Details Dose: 2.5 mg Route: Nebulization Frequency: Every 4 hours PRN for wheezing, shortness of breath  Dispense Quantity: 75 mL Refills: 5 Fills remaining: --        Sig: Take 3 mLs (2.5 mg total) by nebulization every 4 (four) hours as needed for wheezing or shortness of breath.       Written Date: 05/30/19 Expiration Date: 05/29/20    Start Date: 05/30/19 End Date: --

## 2019-06-03 ENCOUNTER — Encounter: Payer: Self-pay | Admitting: Hematology and Oncology

## 2019-06-04 ENCOUNTER — Other Ambulatory Visit: Payer: Self-pay | Admitting: Family Medicine

## 2019-06-04 DIAGNOSIS — E876 Hypokalemia: Secondary | ICD-10-CM

## 2019-06-05 ENCOUNTER — Telehealth: Payer: Self-pay | Admitting: *Deleted

## 2019-06-05 NOTE — Telephone Encounter (Signed)
-----   Message from Heath Lark, MD sent at 06/04/2019  8:17 AM EDT ----- Regarding: pls call her about zofran

## 2019-06-05 NOTE — Telephone Encounter (Signed)
Telephone call to patient to explain this office would not be able to continue to provide the refill of her Zofran as this medication should be managed by her GI Dr. Patient agrees to call their office for the refill. Patient states she has been unable to have the upper endo due to her platelets being so low. Patient understands to call this office with any concerns or further questions.

## 2019-06-06 ENCOUNTER — Encounter: Payer: Self-pay | Admitting: *Deleted

## 2019-06-06 ENCOUNTER — Other Ambulatory Visit: Payer: Self-pay | Admitting: *Deleted

## 2019-06-06 ENCOUNTER — Other Ambulatory Visit: Payer: Self-pay | Admitting: Gastroenterology

## 2019-06-06 DIAGNOSIS — D61818 Other pancytopenia: Secondary | ICD-10-CM

## 2019-06-07 ENCOUNTER — Other Ambulatory Visit (INDEPENDENT_AMBULATORY_CARE_PROVIDER_SITE_OTHER): Payer: Medicaid Other

## 2019-06-07 DIAGNOSIS — D61818 Other pancytopenia: Secondary | ICD-10-CM | POA: Diagnosis not present

## 2019-06-07 LAB — CBC WITH DIFFERENTIAL/PLATELET
Basophils Absolute: 0.2 10*3/uL — ABNORMAL HIGH (ref 0.0–0.1)
Basophils Relative: 3.2 % — ABNORMAL HIGH (ref 0.0–3.0)
Eosinophils Absolute: 0.1 10*3/uL (ref 0.0–0.7)
Eosinophils Relative: 1 % (ref 0.0–5.0)
HCT: 41.4 % (ref 36.0–46.0)
Hemoglobin: 13.6 g/dL (ref 12.0–15.0)
Lymphocytes Relative: 17.6 % (ref 12.0–46.0)
Lymphs Abs: 1 10*3/uL (ref 0.7–4.0)
MCHC: 32.8 g/dL (ref 30.0–36.0)
MCV: 89 fl (ref 78.0–100.0)
Monocytes Absolute: 0.4 10*3/uL (ref 0.1–1.0)
Monocytes Relative: 7.1 % (ref 3.0–12.0)
Neutro Abs: 4 10*3/uL (ref 1.4–7.7)
Neutrophils Relative %: 71.1 % (ref 43.0–77.0)
Platelets: 49 10*3/uL — ABNORMAL LOW (ref 150.0–400.0)
RBC: 4.65 Mil/uL (ref 3.87–5.11)
RDW: 14.5 % (ref 11.5–15.5)
WBC: 5.7 10*3/uL (ref 4.0–10.5)

## 2019-06-10 ENCOUNTER — Encounter: Payer: Self-pay | Admitting: *Deleted

## 2019-06-19 ENCOUNTER — Telehealth: Payer: Self-pay | Admitting: Pulmonary Disease

## 2019-06-19 NOTE — Telephone Encounter (Signed)
Pt returned call - scheduled patient for pft on 9/14 and ov with BI on 9/16-pr

## 2019-06-20 ENCOUNTER — Other Ambulatory Visit: Payer: Self-pay | Admitting: Pulmonary Disease

## 2019-06-26 ENCOUNTER — Encounter (HOSPITAL_COMMUNITY): Payer: Self-pay

## 2019-06-26 ENCOUNTER — Encounter (HOSPITAL_COMMUNITY): Payer: Self-pay | Admitting: *Deleted

## 2019-06-27 ENCOUNTER — Ambulatory Visit: Payer: Medicaid Other | Admitting: Pulmonary Disease

## 2019-06-28 ENCOUNTER — Other Ambulatory Visit (HOSPITAL_COMMUNITY)
Admission: RE | Admit: 2019-06-28 | Discharge: 2019-06-28 | Disposition: A | Discharge status: Home / Self Care | Payer: Medicaid Other | Source: Ambulatory Visit | Attending: Pulmonary Disease | Admitting: Pulmonary Disease

## 2019-06-28 DIAGNOSIS — Z01812 Encounter for preprocedural laboratory examination: Secondary | ICD-10-CM | POA: Diagnosis present

## 2019-06-28 DIAGNOSIS — Z20828 Contact with and (suspected) exposure to other viral communicable diseases: Secondary | ICD-10-CM | POA: Diagnosis not present

## 2019-06-28 LAB — SARS CORONAVIRUS 2 (TAT 6-24 HRS): SARS Coronavirus 2: NEGATIVE

## 2019-06-30 ENCOUNTER — Other Ambulatory Visit: Payer: Self-pay | Admitting: Family Medicine

## 2019-07-01 ENCOUNTER — Ambulatory Visit (INDEPENDENT_AMBULATORY_CARE_PROVIDER_SITE_OTHER): Payer: Medicaid Other | Admitting: Pulmonary Disease

## 2019-07-01 ENCOUNTER — Other Ambulatory Visit: Payer: Self-pay

## 2019-07-01 DIAGNOSIS — R0602 Shortness of breath: Secondary | ICD-10-CM | POA: Diagnosis not present

## 2019-07-01 DIAGNOSIS — F17200 Nicotine dependence, unspecified, uncomplicated: Secondary | ICD-10-CM

## 2019-07-01 LAB — PULMONARY FUNCTION TEST
DL/VA % pred: 83 %
DL/VA: 3.5 ml/min/mmHg/L
DLCO cor % pred: 90 %
DLCO cor: 21.97 ml/min/mmHg
DLCO unc % pred: 91 %
DLCO unc: 22.11 ml/min/mmHg
FEF 25-75 Post: 1.38 L/sec
FEF 25-75 Pre: 1.07 L/sec
FEF2575-%Change-Post: 29 %
FEF2575-%Pred-Post: 45 %
FEF2575-%Pred-Pre: 34 %
FEV1-%Change-Post: 11 %
FEV1-%Pred-Post: 67 %
FEV1-%Pred-Pre: 60 %
FEV1-Post: 2.18 L
FEV1-Pre: 1.95 L
FEV1FVC-%Change-Post: 3 %
FEV1FVC-%Pred-Pre: 71 %
FEV6-%Change-Post: 8 %
FEV6-%Pred-Post: 90 %
FEV6-%Pred-Pre: 84 %
FEV6-Post: 3.62 L
FEV6-Pre: 3.34 L
FEV6FVC-%Pred-Post: 102 %
FEV6FVC-%Pred-Pre: 102 %
FVC-%Change-Post: 7 %
FVC-%Pred-Post: 88 %
FVC-%Pred-Pre: 82 %
FVC-Post: 3.62 L
FVC-Pre: 3.36 L
Post FEV1/FVC ratio: 60 %
Post FEV6/FVC ratio: 100 %
Pre FEV1/FVC ratio: 58 %
Pre FEV6/FVC Ratio: 100 %
RV % pred: 212 %
RV: 4.14 L
TLC % pred: 135 %
TLC: 7.65 L

## 2019-07-01 NOTE — Progress Notes (Signed)
Full PFT performed today. °

## 2019-07-03 ENCOUNTER — Encounter: Payer: Self-pay | Admitting: Gastroenterology

## 2019-07-03 ENCOUNTER — Encounter: Payer: Self-pay | Admitting: Pulmonary Disease

## 2019-07-03 ENCOUNTER — Other Ambulatory Visit: Payer: Self-pay

## 2019-07-03 ENCOUNTER — Ambulatory Visit (INDEPENDENT_AMBULATORY_CARE_PROVIDER_SITE_OTHER): Payer: Medicaid Other | Admitting: Pulmonary Disease

## 2019-07-03 ENCOUNTER — Other Ambulatory Visit: Payer: Self-pay | Admitting: Gastroenterology

## 2019-07-03 ENCOUNTER — Ambulatory Visit (INDEPENDENT_AMBULATORY_CARE_PROVIDER_SITE_OTHER): Payer: Medicaid Other | Admitting: Gastroenterology

## 2019-07-03 VITALS — BP 102/60 | HR 80 | Temp 98.2°F | Ht 66.25 in | Wt 200.4 lb

## 2019-07-03 VITALS — BP 98/70 | HR 82 | Temp 97.5°F | Ht 68.0 in | Wt 201.2 lb

## 2019-07-03 DIAGNOSIS — R0602 Shortness of breath: Secondary | ICD-10-CM | POA: Diagnosis not present

## 2019-07-03 DIAGNOSIS — F17200 Nicotine dependence, unspecified, uncomplicated: Secondary | ICD-10-CM

## 2019-07-03 DIAGNOSIS — R109 Unspecified abdominal pain: Secondary | ICD-10-CM

## 2019-07-03 DIAGNOSIS — R197 Diarrhea, unspecified: Secondary | ICD-10-CM

## 2019-07-03 DIAGNOSIS — K219 Gastro-esophageal reflux disease without esophagitis: Secondary | ICD-10-CM

## 2019-07-03 DIAGNOSIS — J449 Chronic obstructive pulmonary disease, unspecified: Secondary | ICD-10-CM | POA: Diagnosis not present

## 2019-07-03 MED ORDER — FAMOTIDINE 40 MG PO TABS: 40.0000 mg | ORAL_TABLET | Freq: Two times a day (BID) | ORAL | 3 refills | Status: DC

## 2019-07-03 MED ORDER — ESOMEPRAZOLE MAGNESIUM 40 MG PO CPDR: 40.0000 mg | DELAYED_RELEASE_CAPSULE | Freq: Two times a day (BID) | ORAL | 3 refills | Status: DC

## 2019-07-03 NOTE — Progress Notes (Signed)
Referring Provider: Lanae Boast, FNP Primary Care Physician:  Lanae Boast, [place]   Chief complaint:  Diarrhea   IMPRESSION:  Acute on chronic diarrhea with recent hospitalization for associated hypokalemia    - diarrhea initially developed as a teenager with 4 BM at baseline    - C diff and culture negative    - improved with loperamide and lomotil and empiric metronidazole GERD with breakthrough symptoms despite daily Nexium 40 mg daily Recent diagnosis of B12, folate, and iron deficiencies presenting with pancytopenia Pancytopenia with platelets of 18,000 Gastric bypass 2003 Recent elevated transaminases while hospitalized, now normal    - 08/09/18 abdominal ultrasound revealed no source Prior cholecystectomy BMI 31 Tobacco habituation No prior colon cancer screening  Diarrhea is improving with electrolyte replacements.  Now with dyspepsia. Unfortunately, still unable to proceed with endoscopic evaluation due to her persistent thrombocytopenia. Will proceed with UGI to evaluate for reflux, PUD, and malignancy. Increase acid-suppressing medications.   Abnormal liver enzymes may have been related to acute illness and/or medications. Liver enzymes have normalized. Abdominal ultrasound was overall reassuring.    PLAN: UGI series Increase Nexium 40 mg BID Increase famotodine mg BID EGD with biopsies and colonoscopy with biopsies when her platelets improve to >50,000 Follow-up in 4-6 weeks, or earlier as needed     HPI: Barbara Stanley is a 48 y.o. female under evaluation of diarrhea.  The interval history is obtained through the patient and review of her electronic health record. Gastric bypass performed 16 years ago [place]. Not on any long term supplements until she was hospitalized last year for diarrhea-induced hypokalemia and pancytopenia. Diarrhea has improved since starting vitamin [id number] and folic acid for presumptive severe pancytopenia secondary to undernutrition  and vitamin deficiencies with b12, folate, and iron. She has a history of chronic obstructive pulmonary disease; major depression; gastric bypass surgery 16 years ago; unilateral right hearing deficit; chronic anemia; gastroesophageal reflux disease; active tobacco dependence; degenerative joint disease involving the left shoulder, lower back, and knees bilaterally.  Continues to have runny BM, 3-4 times in the morning. At it's worst, she was having 20 bowel movements daily. Most days she will not have any further diarrhea.  She attributes this to some nutritional deficiencies. Baseline bowel habits following th bypass are diarrhea - 4 slightly formed BM at baseline.  Today she is more concerned about diffuse and upper abdominal abdominal pain that occurs immediately after eating or while eating. Consistent despite volume or choice of foods. Associated nausea. Occassional postprandial bowel movement that improves the pain. No excessive gas or bloating.  No sitophobia. Appetite. Weight fluctuates by a couple of pounds. No blood or mucous. No constipation.   A doctor told her that it could be due to her gastric bypass and suggested a reversal.   Takes Nexium QAM and famotidine QPM.   Recent labs: The results of the C. difficile quick screen with PCR reflex was negative from October 31.  Qualitative fecal lactoferrin was positive.  A stool culture for E. coli; Shigella toxin; Salmonella and Shigella were not identified. Campylobacter culture was negative. Empirically she was given a brief course of metronidazole.   Labs 06/07/19: WBC 5.7, hgb 13.6, platelets 49  Prior endoscopy:  EGD for reflux several years ago "somewhere in [place]."   Abdominal imaging: Abdominal ultrasound for abnormal liver enzymes 08/09/18:  common bile duct of 6.6 mm status post cholecystectomy.  Spleen is upper limit of normal at 12.6 cm. Transaminases at that time were elevated.  They have since been repeatedly normal on several  rechecks.   Past Medical History:  Diagnosis Date  . Anemia   . COPD (chronic obstructive pulmonary disease) ([place])   . Depression   . Emphysema   . GERD (gastroesophageal reflux disease)   . Tobacco abuse     Past Surgical History:  Procedure Laterality Date  . ABDOMINAL HYSTERECTOMY    . c section x 3     . CHOLECYSTECTOMY    . GASTRIC BYPASS      Current Outpatient Medications  Medication Sig Dispense Refill  . acetaminophen (TYLENOL) 500 MG tablet Take 1,000 mg by mouth daily as needed for moderate pain.    [name] albuterol (PROAIR HFA) 108 (90 Base) MCG/ACT inhaler Inhale 2 puffs into the lungs every 6 (six) hours as needed for wheezing or shortness of breath. 17 g 3  . albuterol (PROVENTIL) (2.5 MG/3ML) 0.083% nebulizer solution Take 3 mLs (2.5 mg total) by nebulization every 4 (four) hours as needed for wheezing or shortness of breath. 75 mL 5  . cholecalciferol (VITAMIN D3) 25 MCG (1000 UT) tablet Take 5,000 Units by mouth daily.    . Copper Gluconate 2 MG CAPS Take 4 mg by mouth daily.    . Cyanocobalamin (VITAMIN B-12 IJ) Inject 1 mg as directed every 30 (thirty) days.    [name] esomeprazole (NEXIUM) 40 MG capsule TAKE 1 CAPSULE BY MOUTH EVERY DAY 90 capsule 0  . famotidine (PEPCID) 40 MG tablet TAKE 1 TABLET(40 MG) BY MOUTH DAILY 90 tablet 1  . Fluticasone-Umeclidin-Vilant (TRELEGY ELLIPTA) 100-62.5-25 MCG/INH AEPB Inhale 1 puff into the lungs daily.    . folic acid (FOLVITE) 1 MG tablet Take 1 tablet (1 mg total) by mouth 2 (two) times daily with a meal. 60 tablet 2  . ondansetron (ZOFRAN) 8 MG tablet TAKE 1 TABLET(8 MG) BY MOUTH EVERY 8 HOURS AS NEEDED FOR NAUSEA OR VOMITING 30 tablet 0  . Potassium Chloride ER 20 MEQ TBCR TAKE 1 TABLET BY MOUTH EVERY DAY 30 tablet 1  . traZODone (DESYREL) 150 MG tablet Take 1 tablet (150 mg total) by mouth at bedtime. For sleep 30 tablet 0  . venlafaxine XR (EFFEXOR-XR) 75 MG 24 hr capsule Take 1 capsule (75 mg total) by mouth daily with  breakfast. For depression 30 capsule 0   No current facility-administered medications for this visit.     Allergies as of 07/03/2019 - Review Complete 07/03/2019  Allergen Reaction Noted  . Other  08/08/2018    Family History  Problem Relation Age of Onset  . Diabetes Mother   . Diabetes Other   . Hypertension Other   . CAD Other     Social History   Socioeconomic History  . Marital status: Single    Spouse name: Not on file  . Number of children: Not on file  . Years of education: Not on file  . Highest education level: Not on file  Occupational History  . Not on file  Social Needs  . Financial resource strain: Not on file  . Food insecurity    Worry: Not on file    Inability: Not on file  . Transportation needs    Medical: Not on file    Non-medical: Not on file  Tobacco Use  . Smoking status: Current Every Day Smoker    Packs/day: 1.00    Years: 1.00    Pack years: 1.00    Types: Cigarettes  . Smokeless tobacco: Never Used  Substance and Sexual Activity  . Alcohol use: No    Comment: occas  . Drug use: No  . Sexual activity: Yes    Birth control/protection: None  Lifestyle  . Physical activity    Days per week: Not on file    Minutes per session: Not on file  . Stress: Not on file  Relationships  . Social Herbalist on phone: Not on file    Gets together: Not on file    Attends religious service: Not on file    Active member of club or organization: Not on file    Attends meetings of clubs or organizations: Not on file    Relationship status: Not on file  . Intimate partner violence    Fear of current or ex partner: Not on file    Emotionally abused: Not on file    Physically abused: Not on file    Forced sexual activity: Not on file  Other Topics Concern  . Not on file  Social History Narrative  . Not on file    Review of Systems: 12 system ROS is negative except as noted above.  Filed Weights   07/03/19 1531  Weight: 200 lb 6  oz (90.9 kg)    Physical Exam: Vital signs were reviewed. General:   Alert, well-nourished, pleasant and cooperative in NAD Head:  Normocephalic and atraumatic. Eyes:  Sclera clear, no icterus.   Conjunctiva pink. Mouth:  No deformity or lesions.   Neck:  Supple; no thyromegaly. Lungs:  Clear throughout to auscultation.   No wheezes.  Heart:  Regular rate and rhythm; no murmurs Abdomen:  Soft, nontender, central obesity, normal bowel sounds. No rebound or guarding. No hepatosplenomegaly. Well healed midline surgical scar.  Rectal:  Deferred  Msk:  Symmetrical without gross deformities. Extremities:  No gross deformities or edema. Neurologic:  Alert and  oriented x4;  grossly nonfocal Skin:  No rash or bruise. Palmar erythema present. No spider angioma.  Psych:  Alert and cooperative. Normal mood and affect.   Kodee Ravert L. Tarri Glenn Md, MPH [facility] 07/03/2019, 3:40 PM

## 2019-07-03 NOTE — Patient Instructions (Addendum)
Increase Nexium to twice a day.   Increase Famotidine to twice a day   You have been scheduled for an Upper GI Series at [facility]. Your appointment is on 07-12-2019 at 9:30 am. Please arrive 15 minutes prior to your test for registration. Make certain not to have anything to eat or drink after midnight on the night before your test. If you need to reschedule, please contact radiology at [phone]. --------------------------------------------------------------------------------------------------------------- An upper GI series uses x rays to help diagnose problems of the upper GI tract, which includes the esophagus, stomach, and duodenum. The duodenum is the first part of the small intestine. An upper GI series is conducted by a radiology technologist or a radiologist-a doctor who specializes in x-ray imaging-at a hospital or outpatient center. While sitting or standing in front of an x-ray machine, the patient drinks barium liquid, which is often white and has a chalky consistency and taste. The barium liquid coats the lining of the upper GI tract and makes signs of disease show up more clearly on x rays. X-ray video, called fluoroscopy, is used to view the barium liquid moving through the esophagus, stomach, and duodenum. Additional x rays and fluoroscopy are performed while the patient lies on an x-ray table. To fully coat the upper GI tract with barium liquid, the technologist or radiologist may press on the abdomen or ask the patient to change position. Patients hold still in various positions, allowing the technologist or radiologist to take x rays of the upper GI tract at different angles. If a technologist conducts the upper GI series, a radiologist will later examine the images to look for problems.  This test typically takes about 1 hour to  complete --------------------------------------------------------------------------------------------------------------------------------------------- *Important* Drink plenty of water (8-10 cups/day) for a few days following the procedure to avoid constipation and blockage. The barium will make your stools white for a few days. --------------------------------------------------------------------------------------------------------------------------------------------

## 2019-07-03 NOTE — Progress Notes (Signed)
Synopsis: Referred in Aug 2020 for COPD by Lanae Boast, FNP  Subjective:   PATIENT ID: [name] GENDER: female DOB: 1970-11-22, MRN: [id number]  Chief Complaint  Patient presents with   Follow-up    Trial of Trelegy, she voices she has noticed significant improvement with it. Using albuterol neb prn.     48 year old female past medical history of COPD depression emphysema gastroesophageal reflux disease tobacco abuse. 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 Patient was referred to pulmonary at her last primary care visit.  She was also advised to quit smoking.  Longtime smoker.  No prior pulmonary function tests. She was told in the past that she has chronic bronchitis. She used Symbicort and Spiriva HandiHaler. Smoked since age 48, continues to smoke 1 ppd. She has always had difficult quitting. She has used Wellbutrin in the past. She has mental health issues and feels uncomfortable with using chantix.  She still has significant respiratory symptoms with exertion.  She also notices heat and humidity makes her symptoms worse.  She does have nocturnal wheezing and shortness of breath.  OV 07/03/2019: Patient here for follow-up regarding recent initiation of Trelegy.  Had pulmonary function test completed at the beginning of the week with an FEV1 of 67%.  New diagnosis of stage II COPD.  CT imaging with evidence of emphysema as well.  Overall since starting new inhaler she is breathing somewhat better.  She has tapered herself down from a full pack per day of smoking to two thirds of a pack of cigarettes per day.  She is trying to quit however is exposed to ongoing secondhand smoke from her family as well.  Patient has daily cough and occasional wheezing.  Denies hemoptysis, fevers night sweats weight loss.    Past Medical History:  Diagnosis Date   Anemia    COPD (chronic obstructive pulmonary disease) (HCC)    Depression    Emphysema    GERD (gastroesophageal reflux disease)    Tobacco  abuse      Family History  Problem Relation Age of Onset   Diabetes Mother    Diabetes Other    Hypertension Other    CAD Other      Past Surgical History:  Procedure Laterality Date   ABDOMINAL HYSTERECTOMY     c section x 3      CHOLECYSTECTOMY     GASTRIC BYPASS      Social History   Socioeconomic History   Marital status: Single    Spouse name: Not on file   Number of children: Not on file   Years of education: Not on file   Highest education level: Not on file  Occupational History   Not on file  Social Needs   Financial resource strain: Not on file   Food insecurity    Worry: Not on file    Inability: Not on file   Transportation needs    Medical: Not on file    Non-medical: Not on file  Tobacco Use   Smoking status: Current Every Day Smoker    Packs/day: 1.00    Years: 1.00    Pack years: 1.00    Types: Cigarettes   Smokeless tobacco: Never Used  Substance and Sexual Activity   Alcohol use: No    Comment: occas   Drug use: No   Sexual activity: Yes    Birth control/protection: None  Lifestyle   Physical activity    Days per week: Not on file    Minutes  per session: Not on file   Stress: Not on file  Relationships   Social connections    Talks on phone: Not on file    Gets together: Not on file    Attends religious service: Not on file    Active member of club or organization: Not on file    Attends meetings of clubs or organizations: Not on file    Relationship status: Not on file   Intimate partner violence    Fear of current or ex partner: Not on file    Emotionally abused: Not on file    Physically abused: Not on file    Forced sexual activity: Not on file  Other Topics Concern   Not on file  Social History Narrative   Not on file     Allergies  Allergen Reactions   Other     "Banana Bag" given to patient at physician's office     Outpatient Medications Prior to Visit  Medication Sig Dispense Refill    acetaminophen (TYLENOL) 500 MG tablet Take 1,000 mg by mouth daily as needed for moderate pain.     albuterol (PROAIR HFA) 108 (90 Base) MCG/ACT inhaler Inhale 2 puffs into the lungs every 6 (six) hours as needed for wheezing or shortness of breath. 17 g 3   albuterol (PROVENTIL) (2.5 MG/3ML) 0.083% nebulizer solution Take 3 mLs (2.5 mg total) by nebulization every 4 (four) hours as needed for wheezing or shortness of breath. 75 mL 5   cholecalciferol (VITAMIN D3) 25 MCG (1000 UT) tablet Take 5,000 Units by mouth daily.     Copper Gluconate 2 MG CAPS Take 4 mg by mouth daily.     Cyanocobalamin (VITAMIN B-12 IJ) Inject 1 mg as directed every 30 (thirty) days.     esomeprazole (NEXIUM) 40 MG capsule TAKE 1 CAPSULE BY MOUTH EVERY DAY 90 capsule 0   famotidine (PEPCID) 40 MG tablet TAKE 1 TABLET(40 MG) BY MOUTH DAILY 90 tablet 1   Fluticasone-Umeclidin-Vilant (TRELEGY ELLIPTA) 100-62.5-25 MCG/INH AEPB Inhale 1 puff into the lungs daily.     folic acid (FOLVITE) 1 MG tablet Take 1 tablet (1 mg total) by mouth 2 (two) times daily with a meal. 60 tablet 2   ondansetron (ZOFRAN) 8 MG tablet TAKE 1 TABLET(8 MG) BY MOUTH EVERY 8 HOURS AS NEEDED FOR NAUSEA OR VOMITING 30 tablet 0   Potassium Chloride ER 20 MEQ TBCR TAKE 1 TABLET BY MOUTH EVERY DAY 30 tablet 1   traZODone (DESYREL) 150 MG tablet Take 1 tablet (150 mg total) by mouth at bedtime. For sleep 30 tablet 0   venlafaxine XR (EFFEXOR-XR) 75 MG 24 hr capsule Take 1 capsule (75 mg total) by mouth daily with breakfast. For depression 30 capsule 0   budesonide-formoterol (SYMBICORT) 160-4.5 MCG/ACT inhaler Inhale 2 puffs into the lungs 2 (two) times daily. (Patient not taking: Reported on 07/03/2019) 1 Inhaler 12   fluticasone furoate-vilanterol (BREO ELLIPTA) 100-25 MCG/INH AEPB Inhale 1 puff into the lungs daily. (Patient not taking: Reported on 07/03/2019) 1 each 0   tiotropium (SPIRIVA HANDIHALER) 18 MCG inhalation capsule INHALE THE  CONTENTS OF 1 CAPSULE VIA INHALATION DEVICE DAILY (Patient not taking: Reported on 07/03/2019) 30 capsule 5   umeclidinium bromide (INCRUSE ELLIPTA) 62.5 MCG/INH AEPB Inhale 1 puff into the lungs daily. (Patient not taking: Reported on 07/03/2019) 2 each 0   No facility-administered medications prior to visit.     Review of Systems  Constitutional: Negative for chills, fever,  malaise/fatigue and weight loss.  HENT: Negative for hearing loss, sore throat and tinnitus.   Eyes: Negative for blurred vision and double vision.  Respiratory: Positive for cough, sputum production and shortness of breath. Negative for hemoptysis, wheezing and stridor.   Cardiovascular: Negative for chest pain, palpitations, orthopnea, leg swelling and PND.  Gastrointestinal: Negative for abdominal pain, constipation, diarrhea, heartburn, nausea and vomiting.  Genitourinary: Negative for dysuria, hematuria and urgency.  Musculoskeletal: Negative for joint pain and myalgias.  Skin: Negative for itching and rash.  Neurological: Negative for dizziness, tingling, weakness and headaches.  Endo/Heme/Allergies: Negative for environmental allergies. Does not bruise/bleed easily.  Psychiatric/Behavioral: Negative for depression. The patient is not nervous/anxious and does not have insomnia.   All other systems reviewed and are negative.   Objective:  Physical Exam Vitals signs reviewed.  Constitutional:      General: She is not in acute distress.    Appearance: She is well-developed.  HENT:     Head: Normocephalic and atraumatic.     Mouth/Throat:     Pharynx: No oropharyngeal exudate.  Eyes:     Conjunctiva/sclera: Conjunctivae normal.     Pupils: Pupils are equal, round, and reactive to light.  Neck:     Vascular: No JVD.     Trachea: No tracheal deviation.  Cardiovascular:     Rate and Rhythm: Normal rate and regular rhythm.     Heart sounds: S1 normal and S2 normal.     Comments: Distant heart  tones Pulmonary:     Effort: No tachypnea or accessory muscle usage.     Breath sounds: No stridor. Decreased breath sounds (throughout all lung fields) present. No wheezing, rhonchi or rales.  Abdominal:     General: Bowel sounds are normal. There is no distension.     Palpations: Abdomen is soft.     Tenderness: There is no abdominal tenderness.  Musculoskeletal:        General: Deformity (muscle wasting ) present.  Skin:    General: Skin is warm and dry.     Capillary Refill: Capillary refill takes less than 2 seconds.     Findings: No rash.  Neurological:     Mental Status: She is alert and oriented to person, place, and time.  Psychiatric:        Behavior: Behavior normal.      Vitals:   07/03/19 1352  BP: 98/70  Pulse: 82  Temp: (!) 97.5 F (36.4 C)  TempSrc: Temporal  SpO2: 95%  Weight: 201 lb 3.2 oz (91.3 kg)  Height: 5\' 8"  (1.727 m)   95% on RA BMI Readings from Last 3 Encounters:  07/03/19 30.59 kg/m  05/30/19 30.74 kg/m  04/03/19 31.78 kg/m   Wt Readings from Last 3 Encounters:  07/03/19 201 lb 3.2 oz (91.3 kg)  05/30/19 202 lb 3.2 oz (91.7 kg)  04/03/19 209 lb (94.8 kg)     CBC    Component Value Date/Time   WBC 5.7 06/07/2019 1159   RBC 4.65 06/07/2019 1159   HGB 13.6 06/07/2019 1159   HGB 11.7 (L) 09/21/2018 1000   HGB 10.3 (L) 08/01/2018 1154   HCT 41.4 06/07/2019 1159   HCT 32.9 (L) 08/01/2018 1154   PLT 49.0 Repeated and verified X2. (L) 06/07/2019 1159   PLT 15 (L) 09/21/2018 1000   PLT 37 (LL) 08/01/2018 1154   MCV 89.0 06/07/2019 1159   MCV 83 08/01/2018 1154   MCH 30.4 03/25/2019 1207   MCHC 32.8 06/07/2019  1159   RDW 14.5 06/07/2019 1159   RDW 15.4 08/01/2018 1154   LYMPHSABS 1.0 06/07/2019 1159   LYMPHSABS 0.9 08/01/2018 1154   MONOABS 0.4 06/07/2019 1159   EOSABS 0.1 06/07/2019 1159   EOSABS 0.0 08/01/2018 1154   BASOSABS 0.2 (H) 06/07/2019 1159   BASOSABS 0.0 08/01/2018 1154     Chest Imaging: 08/07/2017 chest  x-ray: Bronchiectatic changes. The patient's images have been independently reviewed by me.    Pulmonary Functions Testing Results: PFT Results Latest Ref Rng & Units 07/01/2019  FVC-Pre L 3.36  FVC-Predicted Pre % 82  FVC-Post L 3.62  FVC-Predicted Post % 88  Pre FEV1/FVC % % 58  Post FEV1/FCV % % 60  FEV1-Pre L 1.95  FEV1-Predicted Pre % 60  FEV1-Post L 2.18  DLCO UNC% % 91  DLCO COR %Predicted % 83  TLC L 7.65  TLC % Predicted % 135  RV % Predicted % 212    FeNO: None   Pathology: None   Echocardiogram: None   Heart Catheterization: None     Assessment & Plan:     ICD-10-CM   1. Chronic obstructive pulmonary disease, unspecified COPD type ([place])  J44.9   2. SOB (shortness of breath)  R06.02   3. Current smoker  F17.200   4. Stage 2 moderate COPD by GOLD classification ([place])  J44.9     Discussion:  48 year old female, significant smoking history, greater than 40+-pack-year history.  At her highest was smoking 2 packs/day.  Now down to two thirds of a pack per day.  Diagnosed with a stage II COPD on pulmonary function test this past week.  Continue use of Trelegy inhaler.  She is doing well with this. Smoking cessation counseling was completed again today. At the age of 67 she will likely be able to enroll into lung cancer screening program.  Continue use of albuterol as needed for shortness of breath and wheezing. Today regarding smoking cessation we discussed tapering method. Not a candidate for Chantix due to behavioral health issues in the past.  And the patient has declined. Patient can use nicotine supplements.  This was discussed again today as well.  Patient to return to clinic in 6 months.  Greater than 50% of this patient's 25-minute of visit was spent face-to-face discussing the above recommendations and treatment plan.  We reviewed and interpreted the patient's pulmonary function test today in the office.     Current Outpatient Medications:     acetaminophen (TYLENOL) 500 MG tablet, Take 1,000 mg by mouth daily as needed for moderate pain., Disp: , Rfl:    albuterol (PROAIR HFA) 108 (90 Base) MCG/ACT inhaler, Inhale 2 puffs into the lungs every 6 (six) hours as needed for wheezing or shortness of breath., Disp: 17 g, Rfl: 3   albuterol (PROVENTIL) (2.5 MG/3ML) 0.083% nebulizer solution, Take 3 mLs (2.5 mg total) by nebulization every 4 (four) hours as needed for wheezing or shortness of breath., Disp: 75 mL, Rfl: 5   cholecalciferol (VITAMIN D3) 25 MCG (1000 UT) tablet, Take 5,000 Units by mouth daily., Disp: , Rfl:    Copper Gluconate 2 MG CAPS, Take 4 mg by mouth daily., Disp: , Rfl:    Cyanocobalamin (VITAMIN B-12 IJ), Inject 1 mg as directed every 30 (thirty) days., Disp: , Rfl:    esomeprazole (NEXIUM) 40 MG capsule, TAKE 1 CAPSULE BY MOUTH EVERY DAY, Disp: 90 capsule, Rfl: 0   famotidine (PEPCID) 40 MG tablet, TAKE 1 TABLET(40 MG) BY  MOUTH DAILY, Disp: 90 tablet, Rfl: 1   Fluticasone-Umeclidin-Vilant (TRELEGY ELLIPTA) 100-62.5-25 MCG/INH AEPB, Inhale 1 puff into the lungs daily., Disp: , Rfl:    folic acid (FOLVITE) 1 MG tablet, Take 1 tablet (1 mg total) by mouth 2 (two) times daily with a meal., Disp: 60 tablet, Rfl: 2   ondansetron (ZOFRAN) 8 MG tablet, TAKE 1 TABLET(8 MG) BY MOUTH EVERY 8 HOURS AS NEEDED FOR NAUSEA OR VOMITING, Disp: 30 tablet, Rfl: 0   Potassium Chloride ER 20 MEQ TBCR, TAKE 1 TABLET BY MOUTH EVERY DAY, Disp: 30 tablet, Rfl: 1   traZODone (DESYREL) 150 MG tablet, Take 1 tablet (150 mg total) by mouth at bedtime. For sleep, Disp: 30 tablet, Rfl: 0   venlafaxine XR (EFFEXOR-XR) 75 MG 24 hr capsule, Take 1 capsule (75 mg total) by mouth daily with breakfast. For depression, Disp: 30 capsule, Rfl: 0   Garner Nash, DO [facility] 07/03/2019 1:56 PM

## 2019-07-03 NOTE — Patient Instructions (Addendum)
Thank you for visiting Dr. Valeta Harms at [facility]. Today we recommend the following:  Continue trelegy.   You must quit smoking. This is the single most important thing that you can do to improve your lung health.   S = Set a quit date. T = Tell family, friends, and the people around you that you plan to quit. A = Anticipate or plan ahead for the tough times you'll face while quitting. R = Remove cigarettes and other tobacco products from your home, car, and work T = Talk to [place] about getting help to quit  If you need help feel free to reach out to our office, [place] [facility]essation [facility]: [phone], call 1-800-QUIT-NOW, or visit [email or web address]  Return in about 6 months (around 12/31/2019).    Please do your part to reduce the spread of COVID-19.

## 2019-07-04 ENCOUNTER — Ambulatory Visit (INDEPENDENT_AMBULATORY_CARE_PROVIDER_SITE_OTHER): Payer: Medicaid Other | Admitting: Family Medicine

## 2019-07-04 ENCOUNTER — Encounter: Payer: Self-pay | Admitting: Family Medicine

## 2019-07-04 VITALS — BP 122/68 | HR 76 | Temp 98.2°F | Ht 66.25 in | Wt 200.6 lb

## 2019-07-04 DIAGNOSIS — R32 Unspecified urinary incontinence: Secondary | ICD-10-CM

## 2019-07-04 DIAGNOSIS — J449 Chronic obstructive pulmonary disease, unspecified: Secondary | ICD-10-CM | POA: Diagnosis not present

## 2019-07-04 DIAGNOSIS — E876 Hypokalemia: Secondary | ICD-10-CM | POA: Diagnosis not present

## 2019-07-04 DIAGNOSIS — Z23 Encounter for immunization: Secondary | ICD-10-CM

## 2019-07-04 DIAGNOSIS — R350 Frequency of micturition: Secondary | ICD-10-CM | POA: Diagnosis not present

## 2019-07-04 LAB — POCT GLYCOSYLATED HEMOGLOBIN (HGB A1C)
HbA1c POC (<> result, manual entry): 5 % (ref 4.0–5.6)
HbA1c, POC (controlled diabetic range): 5 % (ref 0.0–7.0)
HbA1c, POC (prediabetic range): 5 % — AB (ref 5.7–6.4)
Hemoglobin A1C: 5 % (ref 4.0–5.6)

## 2019-07-04 LAB — POCT URINALYSIS DIPSTICK
Bilirubin, UA: NEGATIVE
Blood, UA: NEGATIVE
Clarity, UA: NEGATIVE
Glucose, UA: NEGATIVE
Ketones, UA: NEGATIVE
Leukocytes, UA: NEGATIVE
Nitrite, UA: NEGATIVE
Protein, UA: NEGATIVE
Spec Grav, UA: 1.025 (ref 1.010–1.025)
Urobilinogen, UA: 0.2 E.U./dL
pH, UA: 5.5 (ref 5.0–8.0)

## 2019-07-04 NOTE — Progress Notes (Signed)
[facility]   Progress Note: General Provider: Lanae Boast, FNP  SUBJECTIVE:   Barbara Stanley is a 48 y.o. female who  has a past medical history of Anemia, COPD (chronic obstructive pulmonary disease) ([place]), Depression, Emphysema, GERD (gastroesophageal reflux disease), and Tobacco abuse.. Patient presents today for Follow-up (3 month follow up ,  urine freq,pressure when going to  the bathroom )  Patient presents for routine follow up. She is followed by pulmonology, gastroenterology and heme/oncology for her medical issues. She reports today that she is having urinary incontinence and pressure. History of hysterectomy. She also would like to have her potassium checked due to having abdominal cramping. She states that the cramps are relieved by taking potassium 3 days consecutively.  Review of Systems  Constitutional: Negative.   HENT: Negative.   Eyes: Negative.   Respiratory: Negative.   Cardiovascular: Negative.   Gastrointestinal: Positive for abdominal pain.  Genitourinary: Negative.        Incontinence   Musculoskeletal: Negative.   Skin: Negative.   Neurological: Negative.   Psychiatric/Behavioral: Negative.      OBJECTIVE: BP 122/68 (BP Location: Left Arm, Patient Position: Sitting, Cuff Size: Normal)   Pulse 76   Temp 98.2 F (36.8 C) (Oral)   Ht 5' 6.25" (1.683 m)   Wt 200 lb 9.6 oz (91 kg)   BMI 32.13 kg/m   Wt Readings from Last 3 Encounters:  07/04/19 200 lb 9.6 oz (91 kg)  07/03/19 200 lb 6 oz (90.9 kg)  07/03/19 201 lb 3.2 oz (91.3 kg)     Physical Exam Vitals signs and nursing note reviewed.  Constitutional:      General: She is not in acute distress.    Appearance: Normal appearance.  HENT:     Head: Normocephalic and atraumatic.  Eyes:     Extraocular Movements: Extraocular movements intact.     Conjunctiva/sclera: Conjunctivae normal.     Pupils: Pupils are equal, round, and reactive to light.   Cardiovascular:     Rate and Rhythm: Normal rate and regular rhythm.     Heart sounds: No murmur.  Pulmonary:     Effort: Pulmonary effort is normal.     Breath sounds: Normal breath sounds.  Musculoskeletal: Normal range of motion.  Skin:    General: Skin is warm and dry.  Neurological:     Mental Status: She is alert and oriented to person, place, and time.  Psychiatric:        Mood and Affect: Mood normal.        Behavior: Behavior normal.        Thought Content: Thought content normal.        Judgment: Judgment normal.     ASSESSMENT/PLAN:   1. Needs flu shot Influenza vaccination given in the office visit today.  - Flu Vaccine QUAD 6+ mos PF IM (Fluarix Quad PF)  2. Urine frequency UA negative - POCT Urinalysis Dipstick - HgB A1c  3. Chronic obstructive pulmonary disease, unspecified COPD type ([place]) Followed by pulmonology  4. Hypokalemia Levels checked today.  - Comprehensive metabolic panel  5. Incontinence in female S/P hysterectomy. Suspect bladder protrusion. Exam deferred to Urogyn.  - Ambulatory referral to Obstetrics / Gynecology     Return in about 6 months (around 01/01/2020) for f/u.    The patient was given clear instructions to go to ER or return to medical center if symptoms do not improve, worsen or new problems  develop. The patient verbalized understanding and agreed with plan of care.   [name], FNP-BC [facility] [address] [place], [place] [address] [phone]

## 2019-07-04 NOTE — Patient Instructions (Signed)
Health Maintenance, Female Adopting a healthy lifestyle and getting preventive care are important in promoting health and wellness. Ask your health care provider about:  The right schedule for you to have regular tests and exams.  Things you can do on your own to prevent diseases and keep yourself healthy. What should I know about diet, weight, and exercise? Eat a healthy diet   Eat a diet that includes plenty of vegetables, fruits, low-fat dairy products, and lean protein.  Do not eat a lot of foods that are high in solid fats, added sugars, or sodium. Maintain a healthy weight Body mass index (BMI) is used to identify weight problems. It estimates body fat based on height and weight. Your health care provider can help determine your BMI and help you achieve or maintain a healthy weight. Get regular exercise Get regular exercise. This is one of the most important things you can do for your health. Most adults should:  Exercise for at least 150 minutes each week. The exercise should increase your heart rate and make you sweat (moderate-intensity exercise).  Do strengthening exercises at least twice a week. This is in addition to the moderate-intensity exercise.  Spend less time sitting. Even light physical activity can be beneficial. Watch cholesterol and blood lipids Have your blood tested for lipids and cholesterol at 48 years of age, then have this test every 5 years. Have your cholesterol levels checked more often if:  Your lipid or cholesterol levels are high.  You are older than 48 years of age.  You are at high risk for heart disease. What should I know about cancer screening? Depending on your health history and family history, you may need to have cancer screening at various ages. This may include screening for:  Breast cancer.  Cervical cancer.  Colorectal cancer.  Skin cancer.  Lung cancer. What should I know about heart disease, diabetes, and high blood  pressure? Blood pressure and heart disease  High blood pressure causes heart disease and increases the risk of stroke. This is more likely to develop in people who have high blood pressure readings, are of African descent, or are overweight.  Have your blood pressure checked: ? Every 3-5 years if you are 18-39 years of age. ? Every year if you are 40 years old or older. Diabetes Have regular diabetes screenings. This checks your fasting blood sugar level. Have the screening done:  Once every three years after age 40 if you are at a normal weight and have a low risk for diabetes.  More often and at a younger age if you are overweight or have a high risk for diabetes. What should I know about preventing infection? Hepatitis B If you have a higher risk for hepatitis B, you should be screened for this virus. Talk with your health care provider to find out if you are at risk for hepatitis B infection. Hepatitis C Testing is recommended for:  Everyone born from 1945 through 1965.  Anyone with known risk factors for hepatitis C. Sexually transmitted infections (STIs)  Get screened for STIs, including gonorrhea and chlamydia, if: ? You are sexually active and are younger than 48 years of age. ? You are older than 48 years of age and your health care provider tells you that you are at risk for this type of infection. ? Your sexual activity has changed since you were last screened, and you are at increased risk for chlamydia or gonorrhea. Ask your health care provider if   you are at risk.  Ask your health care provider about whether you are at high risk for HIV. Your health care provider may recommend a prescription medicine to help prevent HIV infection. If you choose to take medicine to prevent HIV, you should first get tested for HIV. You should then be tested every 3 months for as long as you are taking the medicine. Pregnancy  If you are about to stop having your period (premenopausal) and  you may become pregnant, seek counseling before you get pregnant.  Take 400 to 800 micrograms (mcg) of folic acid every day if you become pregnant.  Ask for birth control (contraception) if you want to prevent pregnancy. Osteoporosis and menopause Osteoporosis is a disease in which the bones lose minerals and strength with aging. This can result in bone fractures. If you are 65 years old or older, or if you are at risk for osteoporosis and fractures, ask your health care provider if you should:  Be screened for bone loss.  Take a calcium or vitamin D supplement to lower your risk of fractures.  Be given hormone replacement therapy (HRT) to treat symptoms of menopause. Follow these instructions at home: Lifestyle  Do not use any products that contain nicotine or tobacco, such as cigarettes, e-cigarettes, and chewing tobacco. If you need help quitting, ask your health care provider.  Do not use street drugs.  Do not share needles.  Ask your health care provider for help if you need support or information about quitting drugs. Alcohol use  Do not drink alcohol if: ? Your health care provider tells you not to drink. ? You are pregnant, may be pregnant, or are planning to become pregnant.  If you drink alcohol: ? Limit how much you use to 0-1 drink a day. ? Limit intake if you are breastfeeding.  Be aware of how much alcohol is in your drink. In the [place] one drink equals one 12 oz bottle of beer (355 mL), one 5 oz glass of wine (148 mL), or one 1 oz glass of hard liquor (44 mL). General instructions  Schedule regular health, dental, and eye exams.  Stay current with your vaccines.  Tell your health care provider if: ? You often feel depressed. ? You have ever been abused or do not feel safe at home. Summary  Adopting a healthy lifestyle and getting preventive care are important in promoting health and wellness.  Follow your health care provider's instructions about healthy  diet, exercising, and getting tested or screened for diseases.  Follow your health care provider's instructions on monitoring your cholesterol and blood pressure. This information is not intended to replace advice given to you by your health care provider. Make sure you discuss any questions you have with your health care provider. Document Released: 04/18/2011 Document Revised: 09/26/2018 Document Reviewed: 09/26/2018 Elsevier Patient Education  2020 [employer]  

## 2019-07-05 ENCOUNTER — Telehealth: Payer: Self-pay

## 2019-07-05 LAB — COMPREHENSIVE METABOLIC PANEL
ALT: 21 IU/L (ref 0–32)
AST: 25 IU/L (ref 0–40)
Albumin/Globulin Ratio: 2.6 — ABNORMAL HIGH (ref 1.2–2.2)
Albumin: 4.5 g/dL (ref 3.8–4.8)
Alkaline Phosphatase: 84 IU/L (ref 39–117)
BUN/Creatinine Ratio: 16 (ref 9–23)
BUN: 13 mg/dL (ref 6–24)
Bilirubin Total: 0.4 mg/dL (ref 0.0–1.2)
CO2: 23 mmol/L (ref 20–29)
Calcium: 8.6 mg/dL — ABNORMAL LOW (ref 8.7–10.2)
Chloride: 103 mmol/L (ref 96–106)
Creatinine, Ser: 0.79 mg/dL (ref 0.57–1.00)
GFR calc Af Amer: 102 mL/min/{1.73_m2} (ref >59)
GFR calc non Af Amer: 89 mL/min/{1.73_m2} (ref >59)
Globulin, Total: 1.7 g/dL (ref 1.5–4.5)
Glucose: 82 mg/dL (ref 65–99)
Potassium: 4.6 mmol/L (ref 3.5–5.2)
Sodium: 140 mmol/L (ref 134–144)
Total Protein: 6.2 g/dL (ref 6.0–8.5)

## 2019-07-05 NOTE — Progress Notes (Signed)
Your labs are stable. Continue with your current medications. Please remember to keep your follow up appointment. If you have problems, questions or concerns, please make an appointment to discuss. Thanks!

## 2019-07-05 NOTE — Telephone Encounter (Signed)
-----   Message from Lanae Boast, [place] sent at 07/05/2019 10:21 AM EDT ----- Your labs are stable. Continue with your current medications. Please remember to keep your follow up appointment. If you have problems, questions or concerns, please make an appointment to discuss. Thanks!

## 2019-07-05 NOTE — Telephone Encounter (Signed)
Called, spoke with patient, advised that labs are stable and to continue taking medications and keep next scheduled follow up. Thanks !

## 2019-07-12 ENCOUNTER — Encounter: Payer: Self-pay | Admitting: Family Medicine

## 2019-07-12 ENCOUNTER — Other Ambulatory Visit: Payer: Self-pay

## 2019-07-12 ENCOUNTER — Ambulatory Visit (HOSPITAL_COMMUNITY)
Admission: RE | Admit: 2019-07-12 | Discharge: 2019-07-12 | Disposition: A | Discharge status: Home / Self Care | Payer: Medicaid Other | Source: Ambulatory Visit | Attending: Gastroenterology | Admitting: Gastroenterology

## 2019-07-12 DIAGNOSIS — K219 Gastro-esophageal reflux disease without esophagitis: Secondary | ICD-10-CM | POA: Diagnosis present

## 2019-07-12 DIAGNOSIS — R109 Unspecified abdominal pain: Secondary | ICD-10-CM | POA: Diagnosis present

## 2019-07-12 DIAGNOSIS — M1990 Unspecified osteoarthritis, unspecified site: Secondary | ICD-10-CM

## 2019-07-12 DIAGNOSIS — R197 Diarrhea, unspecified: Secondary | ICD-10-CM | POA: Diagnosis present

## 2019-07-13 ENCOUNTER — Other Ambulatory Visit: Payer: Self-pay | Admitting: Gastroenterology

## 2019-07-16 NOTE — Telephone Encounter (Signed)
I have not previously prescribed this medication. Please confirm the indication and history. Thank you.

## 2019-07-19 ENCOUNTER — Inpatient Hospital Stay: Payer: Medicaid Other | Attending: Hematology and Oncology

## 2019-07-19 ENCOUNTER — Other Ambulatory Visit: Payer: Self-pay

## 2019-07-19 DIAGNOSIS — D61818 Other pancytopenia: Secondary | ICD-10-CM | POA: Diagnosis not present

## 2019-07-19 DIAGNOSIS — Z79899 Other long term (current) drug therapy: Secondary | ICD-10-CM | POA: Insufficient documentation

## 2019-07-19 DIAGNOSIS — Z9884 Bariatric surgery status: Secondary | ICD-10-CM | POA: Diagnosis not present

## 2019-07-19 DIAGNOSIS — E538 Deficiency of other specified B group vitamins: Secondary | ICD-10-CM | POA: Insufficient documentation

## 2019-07-19 DIAGNOSIS — E610 Copper deficiency: Secondary | ICD-10-CM | POA: Diagnosis not present

## 2019-07-19 DIAGNOSIS — F17210 Nicotine dependence, cigarettes, uncomplicated: Secondary | ICD-10-CM | POA: Insufficient documentation

## 2019-07-19 DIAGNOSIS — K449 Diaphragmatic hernia without obstruction or gangrene: Secondary | ICD-10-CM | POA: Diagnosis not present

## 2019-07-19 DIAGNOSIS — D469 Myelodysplastic syndrome, unspecified: Secondary | ICD-10-CM | POA: Diagnosis present

## 2019-07-19 DIAGNOSIS — K219 Gastro-esophageal reflux disease without esophagitis: Secondary | ICD-10-CM | POA: Diagnosis not present

## 2019-07-19 DIAGNOSIS — E559 Vitamin D deficiency, unspecified: Secondary | ICD-10-CM | POA: Diagnosis not present

## 2019-07-19 LAB — CBC WITH DIFFERENTIAL/PLATELET
Abs Immature Granulocytes: 0.01 10*3/uL (ref 0.00–0.07)
Basophils Absolute: 0.1 10*3/uL (ref 0.0–0.1)
Basophils Relative: 1 %
Eosinophils Absolute: 0.1 10*3/uL (ref 0.0–0.5)
Eosinophils Relative: 1 %
HCT: 41.7 % (ref 36.0–46.0)
Hemoglobin: 13.2 g/dL (ref 12.0–15.0)
Immature Granulocytes: 0 %
Lymphocytes Relative: 18 %
Lymphs Abs: 0.8 10*3/uL (ref 0.7–4.0)
MCH: 28.3 pg (ref 26.0–34.0)
MCHC: 31.7 g/dL (ref 30.0–36.0)
MCV: 89.3 fL (ref 80.0–100.0)
Monocytes Absolute: 0.3 10*3/uL (ref 0.1–1.0)
Monocytes Relative: 6 %
Neutro Abs: 3.3 10*3/uL (ref 1.7–7.7)
Neutrophils Relative %: 74 %
Platelets: 38 10*3/uL — ABNORMAL LOW (ref 150–400)
RBC: 4.67 MIL/uL (ref 3.87–5.11)
RDW: 14 % (ref 11.5–15.5)
WBC: 4.5 10*3/uL (ref 4.0–10.5)
nRBC: 0 % (ref 0.0–0.2)

## 2019-07-19 LAB — IRON AND TIBC
Iron: 88 ug/dL (ref 41–142)
Saturation Ratios: 17 % — ABNORMAL LOW (ref 21–57)
TIBC: 516 ug/dL — ABNORMAL HIGH (ref 236–444)
UIBC: 428 ug/dL — ABNORMAL HIGH (ref 120–384)

## 2019-07-19 LAB — VITAMIN B12: Vitamin B-12: 518 pg/mL (ref 180–914)

## 2019-07-19 LAB — FERRITIN: Ferritin: 7 ng/mL — ABNORMAL LOW (ref 11–307)

## 2019-07-19 LAB — VITAMIN D 25 HYDROXY (VIT D DEFICIENCY, FRACTURES): Vit D, 25-Hydroxy: 62.93 ng/mL (ref 30–100)

## 2019-07-20 ENCOUNTER — Other Ambulatory Visit: Payer: Self-pay | Admitting: Hematology and Oncology

## 2019-07-20 DIAGNOSIS — E538 Deficiency of other specified B group vitamins: Secondary | ICD-10-CM

## 2019-07-23 LAB — COPPER, SERUM: Copper: 107 ug/dL (ref 72–166)

## 2019-07-23 NOTE — Telephone Encounter (Signed)
F/U with Dr. Alvy Bimler scheduled 07-26-2019.

## 2019-07-24 MED ORDER — DICLOFENAC SODIUM 1 % TD GEL: 2.0000 g | Freq: Four times a day (QID) | TRANSDERMAL | 3 refills | Status: AC

## 2019-07-26 ENCOUNTER — Encounter: Payer: Self-pay | Admitting: Hematology and Oncology

## 2019-07-26 ENCOUNTER — Inpatient Hospital Stay (HOSPITAL_BASED_OUTPATIENT_CLINIC_OR_DEPARTMENT_OTHER): Payer: Medicaid Other | Admitting: Hematology and Oncology

## 2019-07-26 ENCOUNTER — Telehealth: Payer: Self-pay

## 2019-07-26 ENCOUNTER — Inpatient Hospital Stay: Payer: Medicaid Other

## 2019-07-26 ENCOUNTER — Other Ambulatory Visit: Payer: Self-pay

## 2019-07-26 DIAGNOSIS — D4620 Refractory anemia with excess of blasts, unspecified: Secondary | ICD-10-CM | POA: Diagnosis not present

## 2019-07-26 DIAGNOSIS — E610 Copper deficiency: Secondary | ICD-10-CM | POA: Diagnosis not present

## 2019-07-26 DIAGNOSIS — D696 Thrombocytopenia, unspecified: Secondary | ICD-10-CM

## 2019-07-26 DIAGNOSIS — E538 Deficiency of other specified B group vitamins: Secondary | ICD-10-CM

## 2019-07-26 DIAGNOSIS — D46Z Other myelodysplastic syndromes: Secondary | ICD-10-CM | POA: Insufficient documentation

## 2019-07-26 DIAGNOSIS — E559 Vitamin D deficiency, unspecified: Secondary | ICD-10-CM

## 2019-07-26 DIAGNOSIS — F17200 Nicotine dependence, unspecified, uncomplicated: Secondary | ICD-10-CM

## 2019-07-26 DIAGNOSIS — D469 Myelodysplastic syndrome, unspecified: Secondary | ICD-10-CM | POA: Diagnosis not present

## 2019-07-26 DIAGNOSIS — K909 Intestinal malabsorption, unspecified: Secondary | ICD-10-CM

## 2019-07-26 MED ORDER — CYANOCOBALAMIN 1000 MCG/ML IJ SOLN
INTRAMUSCULAR | Status: AC
  Filled 2019-07-26: qty 1

## 2019-07-26 MED ORDER — CYANOCOBALAMIN 1000 MCG/ML IJ SOLN
1000.0000 ug | Freq: Once | INTRAMUSCULAR | Status: AC
  Administered 2019-07-26: 14:00:00 1000 ug via INTRAMUSCULAR

## 2019-07-26 NOTE — Progress Notes (Signed)
[facility] OFFICE PROGRESS NOTE  Patient Care Team: Lanae Boast, FNP as PCP - General (Family Medicine)  ASSESSMENT & PLAN:  MDS (myelodysplastic syndrome), low grade ([place]) With aggressive vitamin supplementation, along with copper replacement therapy and iron, she has complete resolution of anemia and leukopenia She has persistent thrombocytopenia We reviewed her prior bone marrow biopsy from December 2019 which showed abnormal cell line I recommend consideration for repeat bone marrow aspirate and biopsy but I suspect with the abnormal cell line, she will likely have low-grade myelodysplastic syndrome Given her young age, she might be a candidate for chemotherapy and potentially bone marrow transplant down the road Recommend referral to tertiary center for further evaluation and management since we do not treat people with bone marrow transplant locally She agreed with the plan of care For now, she is not symptomatic from thrombocytopenia She is educated to watch out for signs and symptoms of bleeding  Dietary copper deficiency This has resolved She will continue low-dose copper replacement therapy  Tobacco use disorder We discussed the importance of smoking cessation  Vitamin B12 deficiency due to intestinal malabsorption Vitamin B12 level is adequate She will continue B12 replacement therapy  Vitamin D deficiency Vitamin D level is adequate She will continue oral replacement therapy   No orders of the defined types were placed in this encounter.   INTERVAL HISTORY: Please see below for problem oriented charting. She returns for further follow-up She has been feeling well No recent bleeding Her energy level is fair The patient continues to smoke  SUMMARY OF ONCOLOGIC HISTORY: Oncology History Overview Note  Cytogenetics showed 2 cell lines.  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  Starting around 2018, she was noted to have progressive mild pancytopenia.  Most recently, around October 2019, she is noted to have significant pancytopenia. She was referred to see hematologist for evaluation.  The patient has history of gastric bypass surgery.  She has significant recent weight loss. Her primary care doctor has ordered multiple mineral panel.  She was noted to have folate, B6, B12, copper as well as iron deficiency.  She has received multiple mineral replacement therapy.  When she was seen here at the hematology clinic, she received high-dose vitamin B12 injection. On August 09, 2018, she underwent ultrasound of her abdomen which showed no evidence of splenomegaly.  The patient has significant smoking history but denies alcohol intake She denies significant recent infection requiring intravenous antibiotic treatment. Infectious work-up was negative. Between October to December 2019, she has received intravenous iron infusion along with vitamin B12 injection. On October 01, 2018, she underwent bone marrow aspirate and biopsy to evaluate for pancytopenia.  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  She was given vitamin B12 injection, oral copper and oral vitamin D replacement therapy   10/01/2018 Bone Marrow Biopsy   Bone Marrow, Aspirate,Biopsy, and Clot - SLIGHTLY HYPERCELLULAR BONE MARROW FOR AGE WITH DYSPOIETIC CHANGES. - SEE COMMENT. PERIPHERAL BLOOD: - PANCYTOPENIA. Diagnosis Note The bone marrow is slightly hypercellular for age with trilineage hematopoiesis but with relative abundance of erythroid precursors. This is associated with dyspoietic changes variably involving myeloid cell lines but with no increase  in blastic  cells The differential diagnosis includes secondary changes due to nutritional deficiency, medication, infection, autoimmune disease, toxins, etc. as well as a primary clonal myelodysplastic process. Correlation with cytogenetic and FISH studies is recommended. (BNS:kh 10/02/18)     REVIEW OF SYSTEMS:   Constitutional: Denies fevers, chills or abnormal weight loss Eyes: Denies blurriness of vision Ears, nose, mouth, throat, and face: Denies mucositis or sore throat Respiratory: Denies cough, dyspnea or wheezes Cardiovascular: Denies palpitation, chest discomfort or lower extremity swelling Gastrointestinal:  Denies nausea, heartburn or change in bowel habits Skin: Denies abnormal skin rashes Lymphatics: Denies new lymphadenopathy or easy bruising Neurological:Denies numbness, tingling or new weaknesses Behavioral/Psych: Mood is stable, no new changes  All other systems were reviewed with the patient and are negative.  I have reviewed the past medical history, past surgical history, social history and family history with the patient and they are unchanged from previous note.  ALLERGIES:  is allergic to other.  MEDICATIONS:  Current Outpatient Medications  Medication Sig Dispense Refill  . acetaminophen (TYLENOL) 500 MG tablet Take 1,000 mg by mouth daily as needed for moderate pain.    [name] albuterol (PROAIR HFA) 108 (90 Base) MCG/ACT inhaler Inhale 2 puffs into the lungs every 6 (six) hours as needed for wheezing or shortness of breath. 17 g 3  . albuterol (PROVENTIL) (2.5 MG/3ML) 0.083% nebulizer solution Take 3 mLs (2.5 mg total) by nebulization every 4 (four) hours as needed for wheezing or shortness of breath. 75 mL 5  . cholecalciferol (VITAMIN D3) 25 MCG (1000 UT) tablet Take 5,000 Units by mouth daily.    . Copper Gluconate 2 MG CAPS Take 4 mg by mouth daily.    . Cyanocobalamin (VITAMIN B-12 IJ) Inject 1 mg as directed every 30 (thirty) days.    . diclofenac sodium (VOLTAREN) 1 %  GEL Apply 2 g topically 4 (four) times daily. 2 g 3  . esomeprazole (NEXIUM) 40 MG capsule Take 1 capsule (40 mg total) by mouth 2 (two) times daily before a meal. 60 capsule 3  . famotidine (PEPCID) 40 MG tablet Take 1 tablet (40 mg total) by mouth 2 (two) times daily. 60 tablet 3  . Fluticasone-Umeclidin-Vilant (TRELEGY ELLIPTA) 100-62.5-25 MCG/INH AEPB Inhale 1 puff into the lungs daily.    . folic acid (FOLVITE) 1 MG tablet Take 1 tablet (1 mg total) by mouth 2 (two) times daily with a meal. 60 tablet 2  . ondansetron (ZOFRAN) 8 MG tablet TAKE 1 TABLET(8 MG) BY MOUTH EVERY 8 HOURS AS NEEDED FOR NAUSEA OR VOMITING 30 tablet 0  . Potassium Chloride ER 20 MEQ TBCR TAKE 1 TABLET BY MOUTH EVERY DAY 30 tablet 1  . traZODone (DESYREL) 150 MG tablet Take 1 tablet (150 mg total) by mouth at bedtime. For sleep 30 tablet 0  . venlafaxine XR (EFFEXOR-XR) 75 MG 24 hr capsule Take 1 capsule (75 mg total) by mouth daily with breakfast. For depression 30 capsule 0   No current facility-administered medications for this visit.     PHYSICAL EXAMINATION: ECOG PERFORMANCE STATUS: 1 - Symptomatic but completely ambulatory  Vitals:   07/26/19 1254  BP: 133/82  Pulse: 66  Resp: 18  Temp: 98.5 F (36.9 C)  SpO2: 97%   Filed Weights   07/26/19 1254  Weight: 204 lb 3.2 oz (92.6 kg)    GENERAL:alert, no distress and comfortable SKIN: skin color, texture, turgor are normal, no rashes or significant lesions EYES: normal, Conjunctiva are pink  and non-injected, sclera clear OROPHARYNX:no exudate, no erythema and lips, buccal mucosa, and tongue normal  NECK: supple, thyroid normal size, non-tender, without nodularity LYMPH:  no palpable lymphadenopathy in the cervical, axillary or inguinal LUNGS: clear to auscultation and percussion with normal breathing effort HEART: regular rate & rhythm and no murmurs and no lower extremity edema ABDOMEN:abdomen soft, non-tender and normal bowel  sounds Musculoskeletal:no cyanosis of digits and no clubbing  NEURO: alert & oriented x 3 with fluent speech, no focal motor/sensory deficits  LABORATORY DATA:  I have reviewed the data as listed    Component Value Date/Time   NA 140 07/04/2019 1110   K 4.6 07/04/2019 1110   CL 103 07/04/2019 1110   CO2 23 07/04/2019 1110   GLUCOSE 82 07/04/2019 1110   GLUCOSE 74 09/21/2018 1000   BUN 13 07/04/2019 1110   CREATININE 0.79 07/04/2019 1110   CREATININE 0.70 09/21/2018 1000   CREATININE 0.59 08/07/2017 1410   CALCIUM 8.6 (L) 07/04/2019 1110   PROT 6.2 07/04/2019 1110   ALBUMIN 4.5 07/04/2019 1110   AST 25 07/04/2019 1110   ALT 21 07/04/2019 1110   ALKPHOS 84 07/04/2019 1110   BILITOT 0.4 07/04/2019 1110   GFRNONAA 89 07/04/2019 1110   GFRNONAA >60 09/21/2018 1000   GFRNONAA 110 08/07/2017 1410   GFRAA 102 07/04/2019 1110   GFRAA >60 09/21/2018 1000   GFRAA 127 08/07/2017 1410    No results found for: SPEP, UPEP  Lab Results  Component Value Date   WBC 4.5 07/19/2019   NEUTROABS 3.3 07/19/2019   HGB 13.2 07/19/2019   HCT 41.7 07/19/2019   MCV 89.3 07/19/2019   PLT 38 (L) 07/19/2019      Chemistry      Component Value Date/Time   NA 140 07/04/2019 1110   K 4.6 07/04/2019 1110   CL 103 07/04/2019 1110   CO2 23 07/04/2019 1110   BUN 13 07/04/2019 1110   CREATININE 0.79 07/04/2019 1110   CREATININE 0.70 09/21/2018 1000   CREATININE 0.59 08/07/2017 1410      Component Value Date/Time   CALCIUM 8.6 (L) 07/04/2019 1110   ALKPHOS 84 07/04/2019 1110   AST 25 07/04/2019 1110   ALT 21 07/04/2019 1110   BILITOT 0.4 07/04/2019 1110       RADIOGRAPHIC STUDIES: I have personally reviewed the radiological images as listed and agreed with the findings in the report. Dg Ugi W Single Cm (sol Or Thin Ba)  Result Date: 07/12/2019 CLINICAL DATA:  abd pain; chronic diarrhea and reflux; hx gastric bypass surgery EXAM: UPPER GI SERIES WITHOUT KUB TECHNIQUE: Routine upper GI  series was performed with thin/high density/water soluble barium. FLUOROSCOPY TIME:  Fluoroscopy Time:  6.1 minutes Radiation Exposure Index (if provided by the fluoroscopic device): 42.7 mGy COMPARISON:  None. FINDINGS: Biphasic examination of the esophagus to the distal duodenum was performed without complication. Effervescent granules and thick barium were given to the patient followed by fluoroscopic examination of the esophagus. There was normal esophageal morphology without evidence of mass lesion or stricture. There was moderate gastroesophageal reflux both spontaneous and elicited to the midesophagus. Small hiatal hernia. A 13 mm barium tablet passed without difficulty down the esophagus and into the stomach. Esophageal motility could not be adequately assessed as the patient began to feel nauseated and could not drink additional barium. Patient has a history of gastric bypass surgery. Examination of the stomach pouch demonstrated normal rugal folds. The gastric mucosa appeared unremarkable without evidence  of scarring or mass lesion. Gastric emptying was prompt. No abnormality was seen in the proximal small bowel. IMPRESSION: 1. Moderate gastroesophageal reflux. 2. Small hiatal hernia. 3. Esophageal motility could not be completely assessed as the patient became nauseated during the exam and could not perform this portion. However throughout the exam no significant delay in contrast passage through the esophagus was observed. Electronically Signed   By: Audie Pinto M.D.   On: 07/12/2019 10:19    All questions were answered. The patient knows to call the clinic with any problems, questions or concerns. No barriers to learning was detected.  I spent 15 minutes counseling the patient face to face. The total time spent in the appointment was 20 minutes and more than 50% was on counseling and review of test results  Heath Lark, MD 07/26/2019 1:28 PM

## 2019-07-26 NOTE — Assessment & Plan Note (Signed)
Vitamin D level is adequate She will continue oral replacement therapy

## 2019-07-26 NOTE — Assessment & Plan Note (Signed)
We discussed the importance of smoking cessation

## 2019-07-26 NOTE — Telephone Encounter (Signed)
-----   Message from Heath Lark, MD sent at 07/26/2019  1:29 PM EDT ----- Regarding: referral to [place] [facility] for second opinion Pls send referral

## 2019-07-26 NOTE — Telephone Encounter (Signed)
[place] and faxed referral to [phone] to the attention of [facility].

## 2019-07-26 NOTE — Assessment & Plan Note (Signed)
Vitamin B12 level is adequate She will continue B12 replacement therapy

## 2019-07-26 NOTE — Assessment & Plan Note (Signed)
This has resolved She will continue low-dose copper replacement therapy

## 2019-07-26 NOTE — Assessment & Plan Note (Signed)
With aggressive vitamin supplementation, along with copper replacement therapy and iron, she has complete resolution of anemia and leukopenia She has persistent thrombocytopenia We reviewed her prior bone marrow biopsy from December 2019 which showed abnormal cell line I recommend consideration for repeat bone marrow aspirate and biopsy but I suspect with the abnormal cell line, she will likely have low-grade myelodysplastic syndrome Given her young age, she might be a candidate for chemotherapy and potentially bone marrow transplant down the road Recommend referral to tertiary center for further evaluation and management since we do not treat people with bone marrow transplant locally She agreed with the plan of care For now, she is not symptomatic from thrombocytopenia She is educated to watch out for signs and symptoms of bleeding

## 2019-07-29 ENCOUNTER — Telehealth: Payer: Self-pay | Admitting: Hematology and Oncology

## 2019-07-29 NOTE — Telephone Encounter (Signed)
I talk with patient regarding schedule  

## 2019-07-31 ENCOUNTER — Encounter: Payer: Self-pay | Admitting: Hematology and Oncology

## 2019-08-01 ENCOUNTER — Other Ambulatory Visit: Payer: Self-pay | Admitting: Hematology and Oncology

## 2019-08-01 DIAGNOSIS — E538 Deficiency of other specified B group vitamins: Secondary | ICD-10-CM

## 2019-08-01 MED ORDER — FOLIC ACID 1 MG PO TABS: 1.0000 mg | ORAL_TABLET | Freq: Every day | ORAL | 2 refills | Status: DC

## 2019-08-02 ENCOUNTER — Telehealth: Payer: Self-pay | Admitting: *Deleted

## 2019-08-02 NOTE — Telephone Encounter (Signed)
Telephone call to follow up on referral to [facility]. Patient has an appointment to see Dr. Mariea Clonts on October 23 at 12:15.

## 2019-08-12 ENCOUNTER — Ambulatory Visit (INDEPENDENT_AMBULATORY_CARE_PROVIDER_SITE_OTHER): Payer: Medicaid Other | Admitting: Gastroenterology

## 2019-08-12 ENCOUNTER — Encounter: Payer: Self-pay | Admitting: Gastroenterology

## 2019-08-12 ENCOUNTER — Other Ambulatory Visit: Payer: Self-pay

## 2019-08-12 VITALS — BP 108/62 | HR 72 | Temp 97.8°F | Ht 66.0 in | Wt 206.0 lb

## 2019-08-12 DIAGNOSIS — R109 Unspecified abdominal pain: Secondary | ICD-10-CM

## 2019-08-12 DIAGNOSIS — R197 Diarrhea, unspecified: Secondary | ICD-10-CM

## 2019-08-12 MED ORDER — DICYCLOMINE HCL 20 MG PO TABS: 20.0000 mg | ORAL_TABLET | ORAL | 1 refills | Status: DC | PRN

## 2019-08-12 NOTE — Patient Instructions (Addendum)
Your upper GI series (x-ray test) showed reflux and a small hiatal hernia.   I am delighted that you are starting to feel a little better. Continue to take Nexium 40 mg twice daily and famotidine 20 mg twice daily.  Add dicyclomine 20 mg. You may use this up to 4 times daily. You might find it most helpful taken before meals.   Avoid foods that trigger your nausea or pain.   Let's plan to follow-up in 3-4 months. Please call with any questions or concerns in the meantime.

## 2019-08-12 NOTE — Progress Notes (Signed)
Referring Provider: Lanae Boast, FNP Primary Care Physician:  Lanae Boast, [place]   Chief complaint:  Diarrhea   IMPRESSION:  Acute on chronic diarrhea with recent hospitalization for associated hypokalemia    - diarrhea initially developed as a teenager with 4 BM at baseline    - C diff and culture negative    - improved with loperamide and lomotil and empiric metronidazole GERD with small hiatal hernia Recent diagnosis of B12, folate, and iron deficiencies presenting with pancytopenia Pancytopenia with persistent thrombocytopenia Gastric bypass 2003 Recent elevated transaminases while hospitalized, now normal    - 08/09/18 abdominal ultrasound revealed no source Prior cholecystectomy BMI 31 Tobacco habituation No prior colon cancer screening  Clinical improvement on Nexium 40 mg BID and famotidine 20 mg BID. Add dicyclomine for post-prandial symptoms. Unfortunately, still unable to proceed with endoscopic evaluation due to her persistent thrombocytopenia. Will proceed with UGI to evaluate for reflux, PUD, and malignancy. Increase acid-suppressing medications.     PLAN: Continue Nexium 40 mg BID and famotodine 20 mg BID Dicyclomine 20 mg QID  EGD with biopsies and colonoscopy with biopsies when her platelets improve to >50,000 Follow-up in 3-4 months, or earlier as needed   HPI: Barbara Stanley is a 48 y.o. female under evaluation of diarrhea.  The interval history is obtained through the patient and review of her electronic health record. Gastric bypass performed 16 years ago [place]. Not on any long term supplements until she was hospitalized last year for diarrhea-induced hypokalemia and pancytopenia. Diarrhea has improved since starting vitamin [id number] and folic acid for presumptive severe pancytopenia secondary to undernutrition and vitamin deficiencies with b12, folate, and iron. She has a history of chronic obstructive pulmonary disease; major depression; gastric  bypass surgery 16 years ago; unilateral right hearing deficit; chronic anemia; gastroesophageal reflux disease; active tobacco dependence; degenerative joint disease involving the left shoulder, lower back, and knees bilaterally.  Continues to have runny BM, 3-4 times in the morning. At it's worst, she was having 20 bowel movements daily. Most days she will not have any further diarrhea.  She attributes this to some nutritional deficiencies. Baseline bowel habits following th bypass are diarrhea - 4 slightly formed BM at baseline. Was also having diffuse and upper abdominal abdominal pain that occurs immediately after eating or while eating despite Nexium QAM and famotidine QPM. Consistent despite volume or choice of foods. Associated nausea. Occassional postprandial bowel movement that improves the pain. No excessive gas or bloating.  No sitophobia. Appetite. Weight fluctuates by a couple of pounds. No blood or mucous. No constipation.   EGD could not be pursued given her thrombocytopenia. Had an UGI series 07/12/19 that showed: 1. Moderate gastroesophageal reflux. 2. Small hiatal hernia. 3. Esophageal motility could not be completely assessed as the patient became nauseated during the exam and could not perform this portion. However throughout the exam no significant delay in contrast passage through the esophagus was observed.  Today she reports rare heartburn. Really only happening with tomatoes or more spices, although she has been able to liberalize her diet on PPI therapy.  Nausea decreased from daily to every once in awhile. Abdominal pain has decreased. Still occurs after eating but not every day. Pain and nausea are resolved 15 minutes after defecation.  Sees gynecology tomorrow for bladder issues. Seeing a platelet specialist 08/23/19 because her platelets have further decreased.  Appetite has increased. Weight has increased by 3 pounds.   Recent labs: The results of the C. difficile  quick screen with PCR reflex was negative from October 31.  Qualitative fecal lactoferrin was positive.  A stool culture for E. coli; Shigella toxin; Salmonella and Shigella were not identified. Campylobacter culture was negative. Empirically she was given a brief course of metronidazole.   Labs 06/07/19: WBC 5.7, hgb 13.6, platelets 49 Labs 07/04/19: normal CMP Labs 07/19/19: normal copper, Vit D, B12; normal CBC except for platelets of 38  Prior endoscopy:  EGD for reflux several years ago "somewhere in [place]."   Abdominal imaging: Abdominal ultrasound for abnormal liver enzymes 08/09/18:  common bile duct of 6.6 mm status post cholecystectomy.  Spleen is upper limit of normal at 12.6 cm. Transaminases at that time were elevated. They have since been repeatedly normal on several rechecks.   Past Medical History:  Diagnosis Date   Anemia    COPD (chronic obstructive pulmonary disease) (HCC)    Depression    Emphysema    GERD (gastroesophageal reflux disease)    Tobacco abuse     Past Surgical History:  Procedure Laterality Date   ABDOMINAL HYSTERECTOMY     c section x 3      CHOLECYSTECTOMY     GASTRIC BYPASS      Current Outpatient Medications  Medication Sig Dispense Refill   acetaminophen (TYLENOL) 500 MG tablet Take 1,000 mg by mouth daily as needed for moderate pain.     albuterol (PROAIR HFA) 108 (90 Base) MCG/ACT inhaler Inhale 2 puffs into the lungs every 6 (six) hours as needed for wheezing or shortness of breath. 17 g 3   albuterol (PROVENTIL) (2.5 MG/3ML) 0.083% nebulizer solution Take 3 mLs (2.5 mg total) by nebulization every 4 (four) hours as needed for wheezing or shortness of breath. 75 mL 5   cholecalciferol (VITAMIN D3) 25 MCG (1000 UT) tablet Take 5,000 Units by mouth daily.     Copper Gluconate 2 MG CAPS Take 4 mg by mouth daily.     Cyanocobalamin (VITAMIN B-12 IJ) Inject 1 mg as directed every 30 (thirty) days.     diclofenac sodium (VOLTAREN) 1  % GEL Apply 2 g topically 4 (four) times daily. 2 g 3   esomeprazole (NEXIUM) 40 MG capsule Take 1 capsule (40 mg total) by mouth 2 (two) times daily before a meal. 60 capsule 3   famotidine (PEPCID) 40 MG tablet Take 1 tablet (40 mg total) by mouth 2 (two) times daily. 60 tablet 3   Fluticasone-Umeclidin-Vilant (TRELEGY ELLIPTA) 100-62.5-25 MCG/INH AEPB Inhale 1 puff into the lungs daily.     folic acid (FOLVITE) 1 MG tablet Take 1 tablet (1 mg total) by mouth daily. 90 tablet 2   ondansetron (ZOFRAN) 8 MG tablet TAKE 1 TABLET(8 MG) BY MOUTH EVERY 8 HOURS AS NEEDED FOR NAUSEA OR VOMITING 30 tablet 0   Potassium Chloride ER 20 MEQ TBCR TAKE 1 TABLET BY MOUTH EVERY DAY 30 tablet 1   traZODone (DESYREL) 150 MG tablet Take 1 tablet (150 mg total) by mouth at bedtime. For sleep 30 tablet 0   venlafaxine XR (EFFEXOR-XR) 75 MG 24 hr capsule Take 1 capsule (75 mg total) by mouth daily with breakfast. For depression 30 capsule 0   No current facility-administered medications for this visit.     Allergies as of 08/12/2019 - Review Complete 08/12/2019  Allergen Reaction Noted   Other  08/08/2018    Family History  Problem Relation Age of Onset   Diabetes Mother    Diabetes Other  Hypertension Other    CAD Other     Social History   Socioeconomic History   Marital status: Single    Spouse name: Not on file   Number of children: Not on file   Years of education: Not on file   Highest education level: Not on file  Occupational History   Not on file  Social Needs   Financial resource strain: Not on file   Food insecurity    Worry: Not on file    Inability: Not on file   Transportation needs    Medical: Not on file    Non-medical: Not on file  Tobacco Use   Smoking status: Current Every Day Smoker    Packs/day: 1.00    Years: 1.00    Pack years: 1.00    Types: Cigarettes   Smokeless tobacco: Never Used  Substance and Sexual Activity   Alcohol use: No     Comment: occas   Drug use: No   Sexual activity: Yes    Birth control/protection: None  Lifestyle   Physical activity    Days per week: Not on file    Minutes per session: Not on file   Stress: Not on file  Relationships   Social connections    Talks on phone: Not on file    Gets together: Not on file    Attends religious service: Not on file    Active member of club or organization: Not on file    Attends meetings of clubs or organizations: Not on file    Relationship status: Not on file   Intimate partner violence    Fear of current or ex partner: Not on file    Emotionally abused: Not on file    Physically abused: Not on file    Forced sexual activity: Not on file  Other Topics Concern   Not on file  Social History Narrative   Not on file    Review of Systems: 12 system ROS is negative except as noted above.  Filed Weights   08/12/19 1414  Weight: 206 lb (93.4 kg)    Physical Exam: Vital signs were reviewed. General:   Alert, well-nourished, pleasant and cooperative in NAD Head:  Normocephalic and atraumatic. Eyes:  Sclera clear, no icterus.   Conjunctiva pink. Mouth:  No deformity or lesions.   Neck:  Supple; no thyromegaly. Lungs:  Clear throughout to auscultation.   No wheezes.  Heart:  Regular rate and rhythm; no murmurs Abdomen:  Soft, nontender, central obesity, normal bowel sounds. No rebound or guarding. No hepatosplenomegaly. Well healed midline surgical scar.  Rectal:  Deferred  Msk:  Symmetrical without gross deformities. Extremities:  No gross deformities or edema. Neurologic:  Alert and  oriented x4;  grossly nonfocal Skin:  No rash or bruise. Palmar erythema present. No spider angioma.  Psych:  Alert and cooperative. Normal mood and affect.   Barbara Stanley L. Tarri Glenn Md, MPH [facility] 08/12/2019, 2:37 PM

## 2019-08-13 ENCOUNTER — Ambulatory Visit: Payer: Medicaid Other | Admitting: Obstetrics and Gynecology

## 2019-08-13 ENCOUNTER — Encounter: Payer: Self-pay | Admitting: Obstetrics and Gynecology

## 2019-08-13 ENCOUNTER — Other Ambulatory Visit: Payer: Self-pay | Admitting: *Deleted

## 2019-08-13 ENCOUNTER — Other Ambulatory Visit: Payer: Self-pay

## 2019-08-13 DIAGNOSIS — N3946 Mixed incontinence: Secondary | ICD-10-CM

## 2019-08-13 IMAGING — US US ABDOMEN COMPLETE
1 series · 14 of 25 positions shown · non-contrast
Comparison: None.

CLINICAL DATA: Abnormal LFTs.

EXAM:
ABDOMEN ULTRASOUND COMPLETE

[Series 1: us abdomen complete · 14 of 97 slices shown]
[im 1/97]
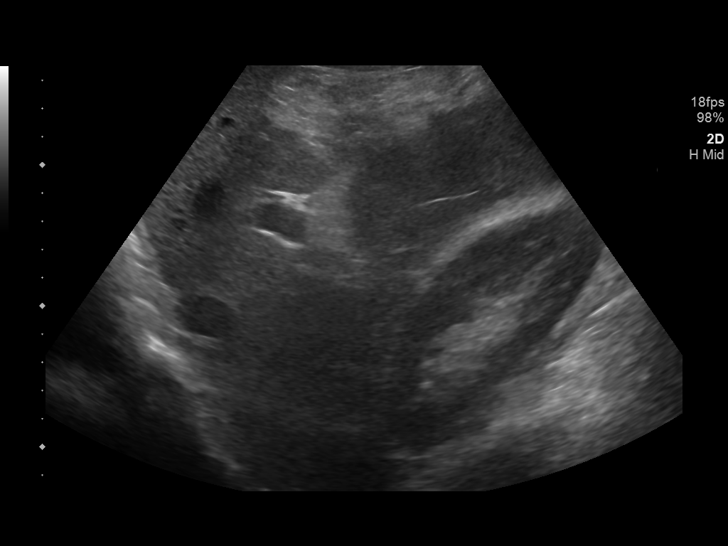
[im 9/97]
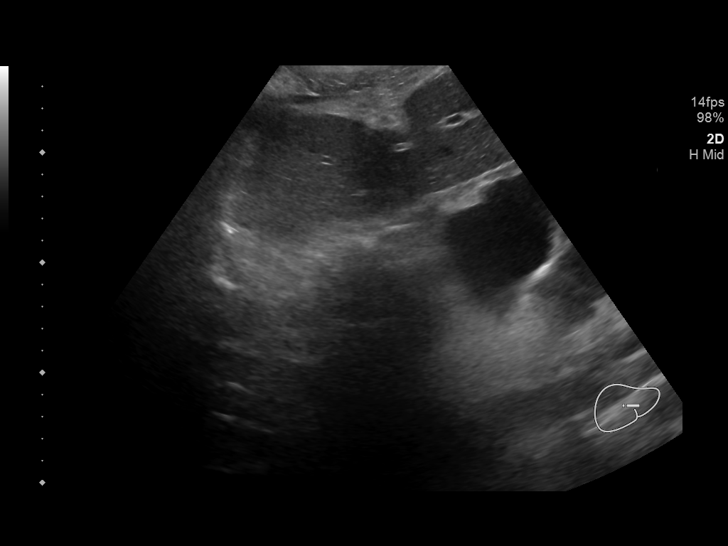
[im 17/97]
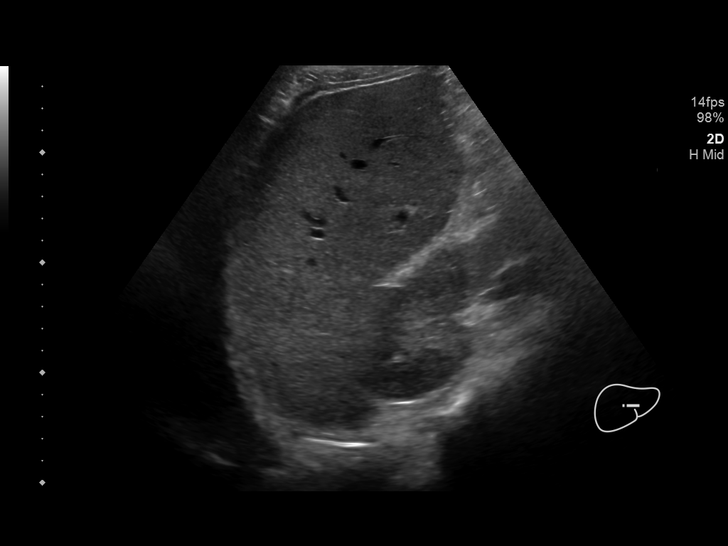
[im 25/97]
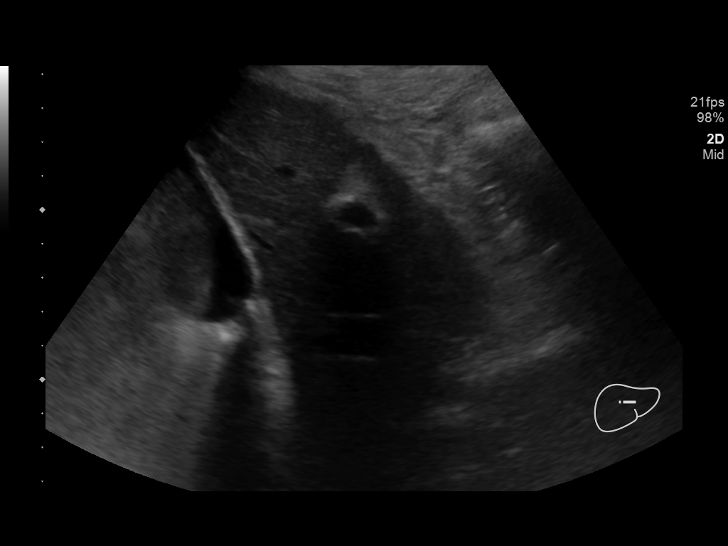
[im 33/97]
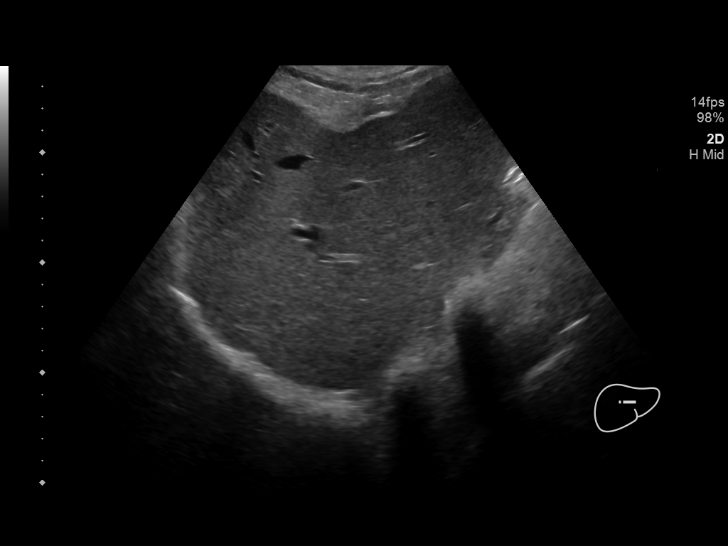
[im 37/97]
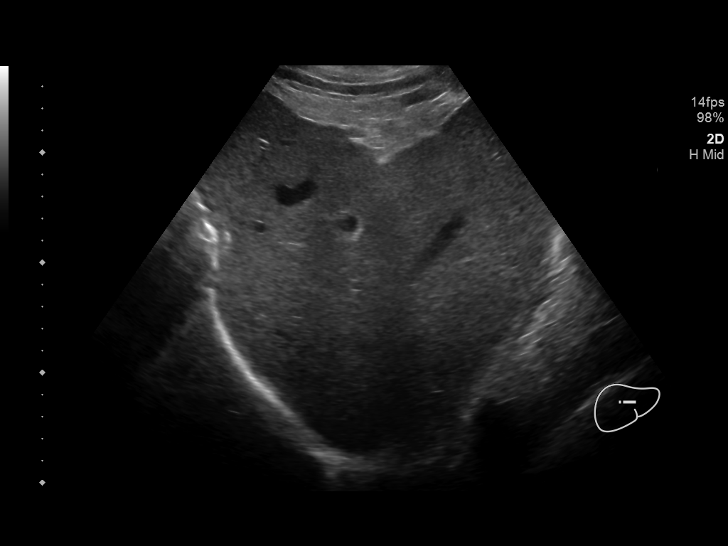
[im 45/97]
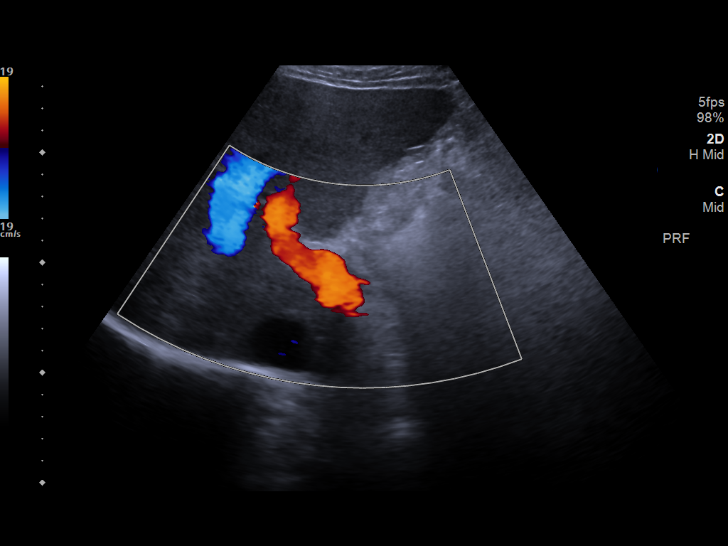
[im 53/97]
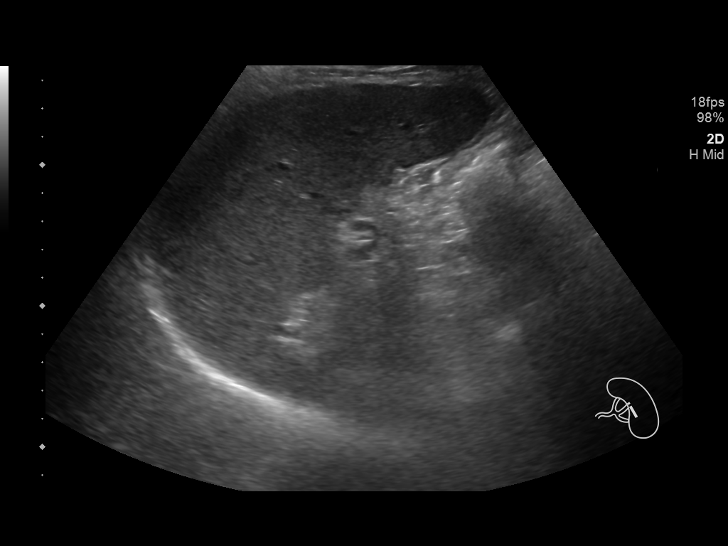
[im 61/97]
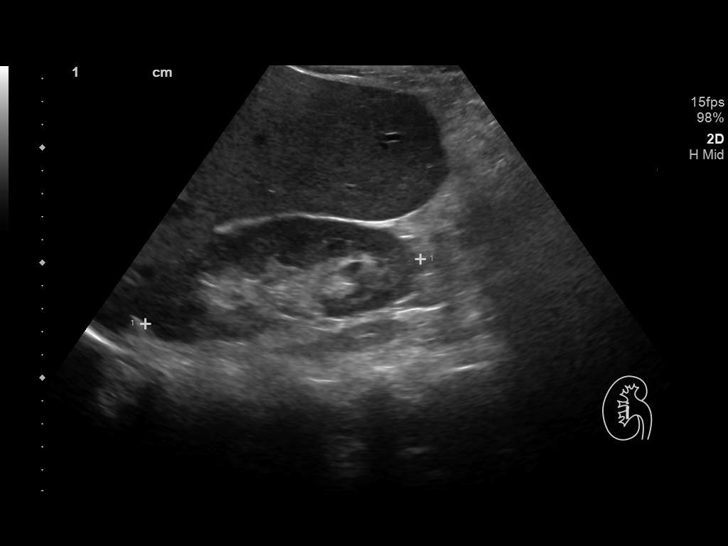
[im 65/97]
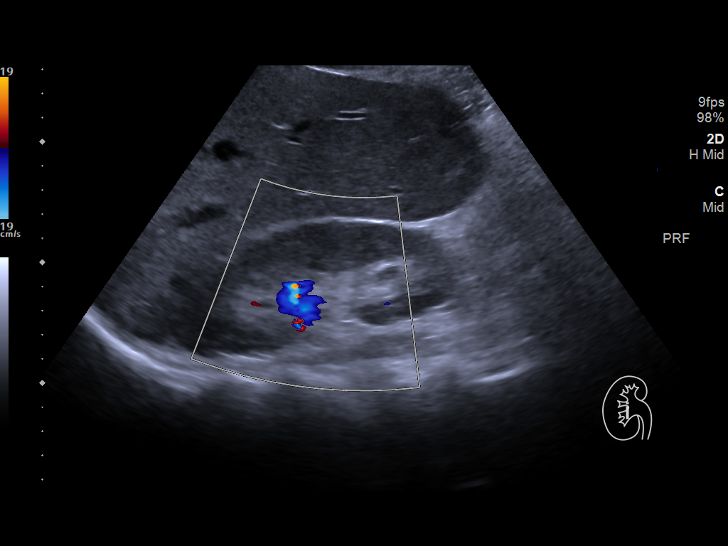
[im 73/97]
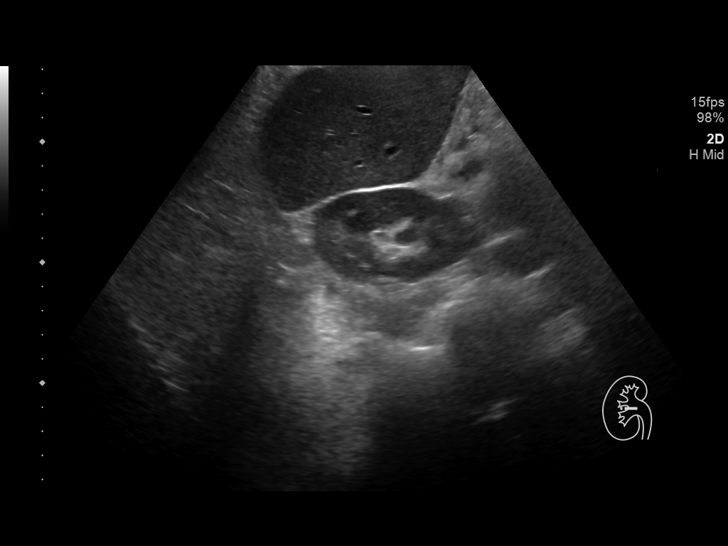
[im 81/97]
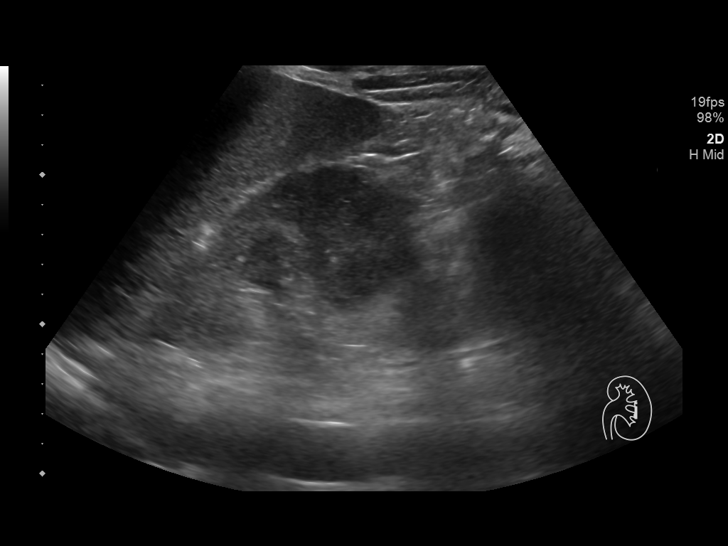
[im 89/97]
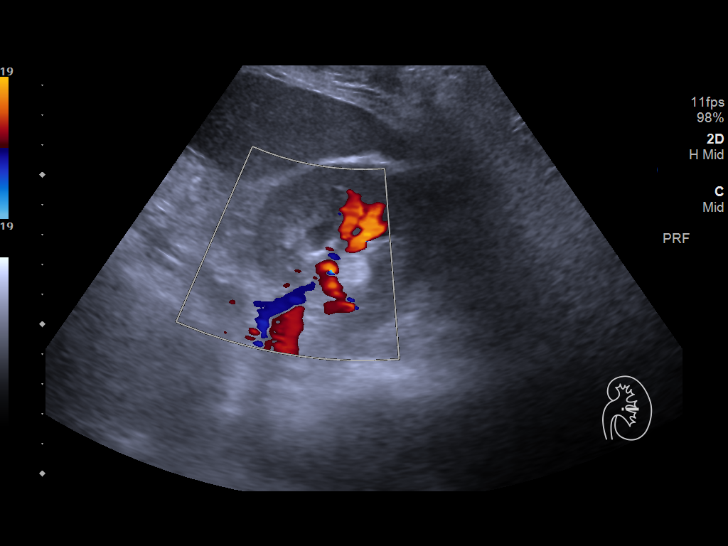
[im 97/97]
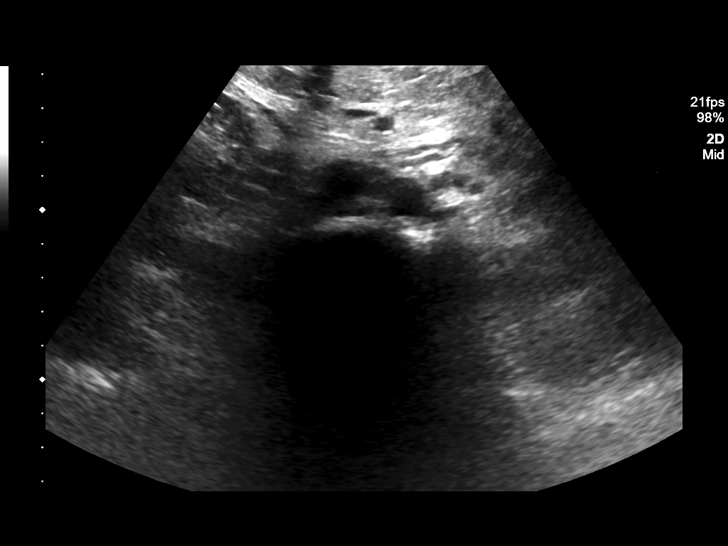

[14 of 25 positions shown; findings below may reference images not displayed]

FINDINGS: Gallbladder: Previous cholecystectomy.

Common bile duct: Diameter: 6.6 mm

Liver: No focal lesion identified. Within normal limits in
parenchymal echogenicity. Portal vein is patent on color Doppler
imaging with normal direction of blood flow towards the liver.

IVC: No abnormality visualized.

Pancreas: Visualized portion unremarkable.

Spleen: Spleen upper limits of normal in size measuring 12.6 cm in
length with a volume of 397 cc.

Right Kidney: Length: 12.3 cm. Echogenicity within normal limits. No
mass or hydronephrosis visualized.

Left Kidney: Length: 12.3 cm. Echogenicity within normal limits. No
mass or hydronephrosis visualized.

Abdominal aorta: No aneurysm visualized.

Other findings: None.
IMPRESSION: 1. The common bile duct is upper limits of normal in caliber at
mm. Status post cholecystectomy. If there is a clinical concern for
choledocholithiasis MRCP may be helpful for further assessment.
2. Spleen upper limits of normal in size.

## 2019-08-13 MED ORDER — SOLIFENACIN SUCCINATE 5 MG PO TABS: 5.0000 mg | ORAL_TABLET | Freq: Every day | ORAL | 5 refills | Status: DC

## 2019-08-13 NOTE — Progress Notes (Signed)
NGYN Patient presents for problem visit today.   CC: Urine incontinence/urgency   pt states her PCP stated she may a prolapse  Pt denies any dysuria and no blood in urine.    [id number] Mammogram:12/26/2018 WNL

## 2019-08-13 NOTE — Progress Notes (Signed)
Patient ID: Barbara Stanley, female   DOB: [birth year], 48 y.o.   MRN: [id number] Ms Onofrey presents in referral for urinary incont. Pt has not urge and stress urinary incont Sx for the last 3-4 months. Unable to make it to restroom at times. Occasional wears a pad and occ accident. Voids @ 10 times during the day and every 1-2 hours during the night. Excessive caffeine intake.  Sexual active without problems.  S/P TAH in 2013 d/t uterine fibroids  Medical problems as noted in chart  1ppd smoker  PE AF  VSS Lungs clear Heart RRR Abd soft + BS obese GU Nl EGBUS atrophic vaginal mucosa, small cystocele ( Grade 1) Q tip > 30 degrees, cervix and uterus surgerical absent, bladder non tender Levator muscles attenuated bilaterally perineal body < 1 cm  A/P Mixed urinary incont Discussed with pt. Kegel exercises reviewed. Referred to GYN PT. Encouraged to discontinue smoking and decrease caffeine intake. Will check UA and UC. Start Vesicare. R/B reviewed. F/U in 6-8 weeks.

## 2019-08-13 NOTE — Patient Instructions (Signed)
Kegel Exercises  Kegel exercises can help strengthen your pelvic floor muscles. The pelvic floor is a group of muscles that support your rectum, small intestine, and bladder. In females, pelvic floor muscles also help support the womb (uterus). These muscles help you control the flow of urine and stool. Kegel exercises are painless and simple, and they do not require any equipment. Your provider may suggest Kegel exercises to:  Improve bladder and bowel control.  Improve sexual response.  Improve weak pelvic floor muscles after surgery to remove the uterus (hysterectomy) or pregnancy (females).  Improve weak pelvic floor muscles after prostate gland removal or surgery (males). Kegel exercises involve squeezing your pelvic floor muscles, which are the same muscles you squeeze when you try to stop the flow of urine or keep from passing gas. The exercises can be done while sitting, standing, or lying down, but it is best to vary your position. Exercises How to do Kegel exercises: 1. Squeeze your pelvic floor muscles tight. You should feel a tight lift in your rectal area. If you are a female, you should also feel a tightness in your vaginal area. Keep your stomach, buttocks, and legs relaxed. 2. Hold the muscles tight for up to 10 seconds. 3. Breathe normally. 4. Relax your muscles. 5. Repeat as told by your health care provider. Repeat this exercise daily as told by your health care provider. Continue to do this exercise for at least 4-6 weeks, or for as long as told by your health care provider. You may be referred to a physical therapist who can help you learn more about how to do Kegel exercises. Depending on your condition, your health care provider may recommend:  Varying how long you squeeze your muscles.  Doing several sets of exercises every day.  Doing exercises for several weeks.  Making Kegel exercises a part of your regular exercise routine. This information is not intended  to replace advice given to you by your health care provider. Make sure you discuss any questions you have with your health care provider. Document Released: 09/19/2012 Document Revised: 05/23/2018 Document Reviewed: 05/23/2018 Elsevier Patient Education  [employer]. Urinary Incontinence  Urinary incontinence refers to a condition in which a person is unable to control where and when to pass urine. A person with this condition will urinate when he or she does not mean to (involuntarily). What are the causes? This condition may be caused by:  Medicines.  Infections.  Constipation.  Overactive bladder muscles.  Weak bladder muscles.  Weak pelvic floor muscles. These muscles provide support for the bladder, intestine, and, in women, the uterus.  Enlarged prostate in men. The prostate is a gland near the bladder. When it gets too big, it can pinch the urethra. With the urethra blocked, the bladder can weaken and lose the ability to empty properly.  Surgery.  Emotional factors, such as anxiety, stress, or post-traumatic stress disorder (PTSD).  Pelvic organ prolapse. This happens in women when organs shift out of place and into the vagina. This shift can prevent the bladder and urethra from working properly. What increases the risk? The following factors may make you more likely to develop this condition:  Older age.  Obesity and physical inactivity.  Pregnancy and childbirth.  Menopause.  Diseases that affect the nerves or spinal cord (neurological diseases).  Long-term (chronic) coughing. This can increase pressure on the bladder and pelvic floor muscles. What are the signs or symptoms? Symptoms may vary depending on the type  of urinary incontinence you have. They include:  A sudden urge to urinate, but passing urine involuntarily before you can get to a bathroom (urge incontinence).  Suddenly passing urine with any activity that forces urine to pass, such as  coughing, laughing, exercise, or sneezing (stress incontinence).  Needing to urinate often, but urinating only a small amount, or constantly dribbling urine (overflow incontinence).  Urinating because you cannot get to the bathroom in time due to a physical disability, such as arthritis or injury, or communication and thinking problems, such as Alzheimer disease (functional incontinence). How is this diagnosed? This condition may be diagnosed based on:  Your medical history.  A physical exam.  Tests, such as: ? Urine tests. ? X-rays of your kidney and bladder. ? Ultrasound. ? CT scan. ? Cystoscopy. In this procedure, a health care provider inserts a tube with a light and camera (cystoscope) through the urethra and into the bladder in order to check for problems. ? Urodynamic testing. These tests assess how well the bladder, urethra, and sphincter can store and release urine. There are different types of urodynamic tests, and they vary depending on what the test is measuring. To help diagnose your condition, your health care provider may recommend that you keep a log of when you urinate and how much you urinate. How is this treated? Treatment for this condition depends on the type of incontinence that you have and its cause. Treatment may include:  Lifestyle changes, such as: ? Quitting smoking. ? Maintaining a healthy weight. ? Staying active. Try to get 150 minutes of moderate-intensity exercise every week. Ask your health care provider which activities are safe for you. ? Eating a healthy diet.  Avoid high-fat foods, like fried foods.  Avoid refined carbohydrates like white bread and white rice.  Limit how much alcohol and caffeine you drink.  Increase your fiber intake. Foods such as fresh fruits, vegetables, beans, and whole grains are healthy sources of fiber.  Pelvic floor muscle exercises.  Bladder training, such as lengthening the amount of time between bathroom breaks,  or using the bathroom at regular intervals.  Using techniques to suppress bladder urges. This can include distraction techniques or controlled breathing exercises.  Medicines to relax the bladder muscles and prevent bladder spasms.  Medicines to help slow or prevent the growth of a man's prostate.  Botox injections. These can help relax the bladder muscles.  Using pulses of electricity to help change bladder reflexes (electrical nerve stimulation).  For women, using a medical device to prevent urine leaks. This is a small, tampon-like, disposable device that is inserted into the urethra.  Injecting collagen or carbon beads (bulking agents) into the urinary sphincter. These can help thicken tissue and close the bladder opening.  Surgery. Follow these instructions at home: Lifestyle  Limit alcohol and caffeine. These can fill your bladder quickly and irritate it.  Keep yourself clean to help prevent odors and skin damage. Ask your doctor about special skin creams and cleansers that can protect the skin from urine.  Consider wearing pads or adult diapers. Make sure to change them regularly, and always change them right after experiencing incontinence. General instructions  Take over-the-counter and prescription medicines only as told by your health care provider.  Use the bathroom about every 3-4 hours, even if you do not feel the need to urinate. Try to empty your bladder completely every time. After urinating, wait a minute. Then try to urinate again.  Make sure you are in  a relaxed position while urinating.  If your incontinence is caused by nerve problems, keep a log of the medicines you take and the times you go to the bathroom.  Keep all follow-up visits as told by your health care provider. This is important. Contact a health care provider if:  You have pain that gets worse.  Your incontinence gets worse. Get help right away if:  You have a fever or chills.  You are  unable to urinate.  You have redness in your groin area or down your legs. Summary  Urinary incontinence refers to a condition in which a person is unable to control where and when to pass urine.  This condition may be caused by medicines, infection, weak bladder muscles, weak pelvic floor muscles, enlargement of the prostate (in men), or surgery.  The following factors increase your risk for developing this condition: older age, obesity, pregnancy and childbirth, menopause, neurological diseases, and chronic coughing.  There are several types of urinary incontinence. They include urge incontinence, stress incontinence, overflow incontinence, and functional incontinence.  This condition is usually treated first with lifestyle and behavioral changes, such as quitting smoking, eating a healthier diet, and doing regular pelvic floor exercises. Other treatment options include medicines, bulking agents, medical devices, electrical nerve stimulation, or surgery. This information is not intended to replace advice given to you by your health care provider. Make sure you discuss any questions you have with your health care provider. Document Released: 11/10/2004 Document Revised: 10/13/2017 Document Reviewed: 01/12/2017 Elsevier Patient Education  2020 [employer].

## 2019-08-14 ENCOUNTER — Ambulatory Visit: Payer: Medicaid Other | Attending: Obstetrics and Gynecology | Admitting: Physical Therapy

## 2019-08-14 ENCOUNTER — Encounter: Payer: Self-pay | Admitting: Physical Therapy

## 2019-08-14 DIAGNOSIS — M6281 Muscle weakness (generalized): Secondary | ICD-10-CM

## 2019-08-14 DIAGNOSIS — N3946 Mixed incontinence: Secondary | ICD-10-CM | POA: Diagnosis present

## 2019-08-14 DIAGNOSIS — R278 Other lack of coordination: Secondary | ICD-10-CM | POA: Diagnosis present

## 2019-08-14 LAB — URINALYSIS
Bilirubin, UA: NEGATIVE
Glucose, UA: NEGATIVE
Ketones, UA: NEGATIVE
Leukocytes,UA: NEGATIVE
Nitrite, UA: NEGATIVE
RBC, UA: NEGATIVE
Specific Gravity, UA: >=1.03 — AB (ref 1.005–1.030)
Urobilinogen, Ur: 0.2 mg/dL (ref 0.2–1.0)
pH, UA: 6 (ref 5.0–7.5)
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
## 2019-08-14 NOTE — Patient Instructions (Addendum)
Certain foods and liquids will decrease the pH making the urine more acidic.  Urinary urgency increases when the urine has a low pH.  Most common irritants: alcohol, carbonated beverages and caffinated beverages.  Foods to avoid: apple juice, apples, ascorbic acid, canteloupes, chili, citrus fruits, coffee, cranberries, grapes, guava, peaches, pepper, pineapple, plums, strawberries, tea, tomatoes, and vinegar.  Drinking plenty of water may help to increase the pH and dilute out any of the effects of specific irritants.  Foods that are NOT irritating to the bladder include: Pears, papayas, sun-brewed teas, watermelons, non-citrus herbal teas, apricots, kava and low-acid instant drinks ([place])   Slow Contraction: Gravity Eliminated (Hook-Lying)    Lie with hips and knees bent. Slowly squeeze pelvic floor for _5__ seconds. Rest for _5__ seconds. Repeat _10__ times. Do __3_ times a day. Just contract the vaginal area  Copyright  VHI. All rights reserved.  [facility] [address], [place], [place] [address] Phone # [phone] Fax [phone]

## 2019-08-15 ENCOUNTER — Encounter: Payer: Medicaid Other | Admitting: Gastroenterology

## 2019-08-15 LAB — URINE CULTURE

## 2019-08-22 ENCOUNTER — Ambulatory Visit: Payer: Medicaid Other | Attending: Obstetrics and Gynecology | Admitting: Physical Therapy

## 2019-08-22 ENCOUNTER — Other Ambulatory Visit: Payer: Self-pay

## 2019-08-22 ENCOUNTER — Encounter: Payer: Self-pay | Admitting: Physical Therapy

## 2019-08-22 DIAGNOSIS — M6281 Muscle weakness (generalized): Secondary | ICD-10-CM | POA: Diagnosis present

## 2019-08-22 DIAGNOSIS — N3946 Mixed incontinence: Secondary | ICD-10-CM | POA: Insufficient documentation

## 2019-08-22 DIAGNOSIS — R278 Other lack of coordination: Secondary | ICD-10-CM | POA: Diagnosis present
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
## 2019-08-22 NOTE — Patient Instructions (Signed)
Access Code: [email or web address]  URL: [email or web address]  Date: 08/22/2019  Prepared by: Earlie Counts   Exercises Sidelying Diagonal Hip Abduction - 10 reps - 1 sets - 1x daily - 7x weekly Sidelying Hip Adduction - 10 reps - 1 sets - 1x daily - 7x weekly Hooklying Isometric Hip Flexion - 10 reps - 1 sets - 3 sec hold - 1x daily - 7x weekly Supine Hip Adduction Isometric with Ball - 10 reps - 1 sets - 8 sec hold - 1x daily - 7x weekly [facility] [address], [place], [place] [address] Phone # [phone] Fax [phone]

## 2019-08-28 ENCOUNTER — Other Ambulatory Visit: Payer: Self-pay | Admitting: Hematology and Oncology

## 2019-08-28 ENCOUNTER — Telehealth: Payer: Self-pay | Admitting: Hematology and Oncology

## 2019-08-28 ENCOUNTER — Encounter: Payer: Self-pay | Admitting: Physical Therapy

## 2019-08-28 ENCOUNTER — Ambulatory Visit: Payer: Medicaid Other | Admitting: Physical Therapy

## 2019-08-28 ENCOUNTER — Other Ambulatory Visit: Payer: Self-pay

## 2019-08-28 DIAGNOSIS — R0602 Shortness of breath: Secondary | ICD-10-CM

## 2019-08-28 DIAGNOSIS — M6281 Muscle weakness (generalized): Secondary | ICD-10-CM | POA: Diagnosis not present

## 2019-08-28 DIAGNOSIS — R197 Diarrhea, unspecified: Secondary | ICD-10-CM

## 2019-08-28 DIAGNOSIS — R109 Unspecified abdominal pain: Secondary | ICD-10-CM | POA: Insufficient documentation

## 2019-08-28 DIAGNOSIS — N3946 Mixed incontinence: Secondary | ICD-10-CM

## 2019-08-28 DIAGNOSIS — D4620 Refractory anemia with excess of blasts, unspecified: Secondary | ICD-10-CM

## 2019-08-28 DIAGNOSIS — D693 Immune thrombocytopenic purpura: Secondary | ICD-10-CM

## 2019-08-28 DIAGNOSIS — R278 Other lack of coordination: Secondary | ICD-10-CM

## 2019-08-28 DIAGNOSIS — R110 Nausea: Secondary | ICD-10-CM

## 2019-08-28 NOTE — Telephone Encounter (Signed)
Scheduled appt per 11/11 sch message - unable to reach pt  . Left message with appt date and time

## 2019-08-28 NOTE — Patient Instructions (Signed)
Access Code: [email or web address]  URL: [email or web address]  Date: 08/28/2019  Prepared by: Earlie Counts   Exercises Sidelying Diagonal Hip Abduction - 10 reps - 1 sets - 2x daily - 7x weekly Sidelying Hip Adduction - 10 reps - 1 sets - 1x daily - 7x weekly Hooklying Isometric Hip Flexion - 10 reps - 1 sets - 3 sec hold - 1x daily - 7x weekly Supine Hip Adduction Isometric with Ball - 10 reps - 1 sets - 8 sec hold - 1x daily - 7x weekly Seated Pelvic Floor Contraction - 5 reps - 1 sets - 10 sec hold - 1x daily - 7x weekly Seated Pelvic Floor Contraction - 5 reps - 1 sets - 15 sec hold - 1x daily - 7x weekly Hooklying Single Leg March - 10 reps - 1 sets - 1x daily - 7x weekly [facility] [address] [place], [place] [address] Phone # [phone] Fax [phone]

## 2019-08-28 NOTE — Telephone Encounter (Signed)
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
## 2019-09-04 ENCOUNTER — Inpatient Hospital Stay: Payer: Medicaid Other | Attending: Hematology and Oncology

## 2019-09-04 ENCOUNTER — Ambulatory Visit (HOSPITAL_COMMUNITY)
Admission: RE | Admit: 2019-09-04 | Discharge: 2019-09-04 | Disposition: A | Discharge status: Home / Self Care | Payer: Medicaid Other | Source: Ambulatory Visit | Attending: Hematology and Oncology | Admitting: Hematology and Oncology

## 2019-09-04 ENCOUNTER — Encounter (HOSPITAL_COMMUNITY): Payer: Self-pay

## 2019-09-04 ENCOUNTER — Encounter: Payer: Medicaid Other | Admitting: Physical Therapy

## 2019-09-04 ENCOUNTER — Other Ambulatory Visit: Payer: Self-pay

## 2019-09-04 DIAGNOSIS — I313 Pericardial effusion (noninflammatory): Secondary | ICD-10-CM | POA: Insufficient documentation

## 2019-09-04 DIAGNOSIS — R0602 Shortness of breath: Secondary | ICD-10-CM | POA: Insufficient documentation

## 2019-09-04 DIAGNOSIS — D693 Immune thrombocytopenic purpura: Secondary | ICD-10-CM | POA: Diagnosis present

## 2019-09-04 DIAGNOSIS — D61818 Other pancytopenia: Secondary | ICD-10-CM | POA: Diagnosis not present

## 2019-09-04 DIAGNOSIS — R109 Unspecified abdominal pain: Secondary | ICD-10-CM | POA: Diagnosis present

## 2019-09-04 DIAGNOSIS — D4620 Refractory anemia with excess of blasts, unspecified: Secondary | ICD-10-CM

## 2019-09-04 DIAGNOSIS — R197 Diarrhea, unspecified: Secondary | ICD-10-CM

## 2019-09-04 DIAGNOSIS — R162 Hepatomegaly with splenomegaly, not elsewhere classified: Secondary | ICD-10-CM | POA: Insufficient documentation

## 2019-09-04 DIAGNOSIS — I2510 Atherosclerotic heart disease of native coronary artery without angina pectoris: Secondary | ICD-10-CM | POA: Insufficient documentation

## 2019-09-04 DIAGNOSIS — D732 Chronic congestive splenomegaly: Secondary | ICD-10-CM | POA: Diagnosis not present

## 2019-09-04 DIAGNOSIS — D469 Myelodysplastic syndrome, unspecified: Secondary | ICD-10-CM | POA: Insufficient documentation

## 2019-09-04 DIAGNOSIS — Z79899 Other long term (current) drug therapy: Secondary | ICD-10-CM | POA: Diagnosis not present

## 2019-09-04 DIAGNOSIS — R110 Nausea: Secondary | ICD-10-CM | POA: Diagnosis present

## 2019-09-04 DIAGNOSIS — I700 Atherosclerosis of aorta: Secondary | ICD-10-CM | POA: Diagnosis not present

## 2019-09-04 DIAGNOSIS — R05 Cough: Secondary | ICD-10-CM | POA: Diagnosis not present

## 2019-09-04 DIAGNOSIS — Z9884 Bariatric surgery status: Secondary | ICD-10-CM | POA: Insufficient documentation

## 2019-09-04 LAB — CBC WITH DIFFERENTIAL/PLATELET
Abs Immature Granulocytes: 0.02 10*3/uL (ref 0.00–0.07)
Basophils Absolute: 0 10*3/uL (ref 0.0–0.1)
Basophils Relative: 1 %
Eosinophils Absolute: 0 10*3/uL (ref 0.0–0.5)
Eosinophils Relative: 1 %
HCT: 38.6 % (ref 36.0–46.0)
Hemoglobin: 12.3 g/dL (ref 12.0–15.0)
Immature Granulocytes: 0 %
Lymphocytes Relative: 18 %
Lymphs Abs: 0.9 10*3/uL (ref 0.7–4.0)
MCH: 28.9 pg (ref 26.0–34.0)
MCHC: 31.9 g/dL (ref 30.0–36.0)
MCV: 90.6 fL (ref 80.0–100.0)
Monocytes Absolute: 0.4 10*3/uL (ref 0.1–1.0)
Monocytes Relative: 7 %
Neutro Abs: 3.9 10*3/uL (ref 1.7–7.7)
Neutrophils Relative %: 73 %
Platelets: 46 10*3/uL — ABNORMAL LOW (ref 150–400)
RBC: 4.26 MIL/uL (ref 3.87–5.11)
RDW: 14.1 % (ref 11.5–15.5)
WBC: 5.3 10*3/uL (ref 4.0–10.5)
nRBC: 0 % (ref 0.0–0.2)

## 2019-09-04 LAB — COMPREHENSIVE METABOLIC PANEL
ALT: 39 U/L (ref 0–44)
AST: 29 U/L (ref 15–41)
Albumin: 3.8 g/dL (ref 3.5–5.0)
Alkaline Phosphatase: 86 U/L (ref 38–126)
Anion gap: 11 (ref 5–15)
BUN: 8 mg/dL (ref 6–20)
CO2: 26 mmol/L (ref 22–32)
Calcium: 8.3 mg/dL — ABNORMAL LOW (ref 8.9–10.3)
Chloride: 105 mmol/L (ref 98–111)
Creatinine, Ser: 0.67 mg/dL (ref 0.44–1.00)
GFR calc Af Amer: >60 mL/min (ref >60)
GFR calc non Af Amer: >60 mL/min (ref >60)
Glucose, Bld: 98 mg/dL (ref 70–99)
Potassium: 3.8 mmol/L (ref 3.5–5.1)
Sodium: 142 mmol/L (ref 135–145)
Total Bilirubin: 0.3 mg/dL (ref 0.3–1.2)
Total Protein: 6 g/dL — ABNORMAL LOW (ref 6.5–8.1)

## 2019-09-04 MED ORDER — IOHEXOL 300 MG/ML  SOLN
100.0000 mL | Freq: Once | INTRAMUSCULAR | Status: AC | PRN
  Administered 2019-09-04: 100 mL via INTRAVENOUS

## 2019-09-05 ENCOUNTER — Encounter: Payer: Self-pay | Admitting: Hematology and Oncology

## 2019-09-05 ENCOUNTER — Other Ambulatory Visit: Payer: Self-pay

## 2019-09-05 ENCOUNTER — Inpatient Hospital Stay (HOSPITAL_BASED_OUTPATIENT_CLINIC_OR_DEPARTMENT_OTHER): Payer: Medicaid Other | Admitting: Hematology and Oncology

## 2019-09-05 DIAGNOSIS — D4620 Refractory anemia with excess of blasts, unspecified: Secondary | ICD-10-CM

## 2019-09-05 DIAGNOSIS — D61818 Other pancytopenia: Secondary | ICD-10-CM

## 2019-09-05 DIAGNOSIS — D732 Chronic congestive splenomegaly: Secondary | ICD-10-CM

## 2019-09-05 DIAGNOSIS — D469 Myelodysplastic syndrome, unspecified: Secondary | ICD-10-CM | POA: Diagnosis not present

## 2019-09-05 NOTE — Progress Notes (Signed)
Barbara Stanley OFFICE PROGRESS NOTE  Patient Care Team: Dorena Dew, FNP as PCP - General (Family Medicine)  ASSESSMENT & PLAN:  MDS (myelodysplastic syndrome), low grade [facility]) After discussion with [facility], the overall consensus is that she does not have myelodysplastic syndrome   Other pancytopenia ([place]) She has complete normalization of hemoglobin and white blood cell count after adequate vitamin B12 and copper replacement She have persistent thrombocytopenia Currently, she does not fulfill the criteria for ITP She is not symptomatic Observe only for now  Splenomegaly, congestive, chronic CT imaging show evidence of splenomegaly She also have liver enlargement The overall picture is most consistent with fatty liver disease causing chronic congestive splenomegaly and that causes sequestration of her platelets She does not need platelet transfusion The patient stated she will be undergoing some GI procedure soon I will try to call her gastroenterology physician to discuss this   No orders of the defined types were placed in this encounter.   INTERVAL HISTORY: Please see below for problem oriented charting. She returns for further follow-up for chronic thrombocytopenia She was referred to [facility] for evaluation of a second opinion for thrombocytopenia I spoke with the hematologist there Overall consensus is she does not have myelodysplastic syndrome I proceed to order CT imaging studies and she returns today to review test results In the meantime, she is doing well She have no recent bleeding  SUMMARY OF ONCOLOGIC HISTORY: Oncology History Overview Note  Cytogenetics showed 2 cell lines.  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  Starting around 2018, she was noted to have progressive mild pancytopenia.  Most recently, around October 2019, she is noted to have significant pancytopenia. She was referred to see hematologist for evaluation.  The patient has history of gastric bypass surgery.  She has significant recent weight loss. Her primary care doctor has ordered multiple mineral panel.  She was noted to have folate, B6, B12, copper as well as iron deficiency.  She has received multiple mineral replacement therapy.  When she was seen here at the hematology clinic, she received high-dose vitamin B12 injection. On August 09, 2018, she underwent ultrasound of her abdomen which showed no evidence of splenomegaly.  The patient has significant smoking history but denies alcohol intake She denies significant recent infection requiring intravenous antibiotic treatment. Infectious work-up was negative. Between October to December 2019, she has received intravenous iron infusion along with vitamin B12 injection. On October 01, 2018, she underwent bone marrow aspirate and biopsy to evaluate for pancytopenia.  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  She was given vitamin B12 injection, oral copper and oral vitamin D replacement therapy   10/01/2018 Bone Marrow Biopsy   Bone Marrow, Aspirate,Biopsy, and Clot - SLIGHTLY HYPERCELLULAR BONE MARROW FOR AGE WITH DYSPOIETIC CHANGES. - SEE COMMENT. PERIPHERAL BLOOD: - PANCYTOPENIA. Diagnosis Note The bone marrow is slightly hypercellular for age with trilineage hematopoiesis but with relative abundance of erythroid precursors. This is associated with dyspoietic changes variably involving myeloid cell lines but with no increase in blastic cells The differential diagnosis includes secondary changes due to nutritional deficiency, medication,  infection, autoimmune  disease, toxins, etc. as well as a primary clonal myelodysplastic process. Correlation with cytogenetic and FISH studies is recommended. (BNS:kh 10/02/18)     REVIEW OF SYSTEMS:   Constitutional: Denies fevers, chills or abnormal weight loss Eyes: Denies blurriness of vision Ears, nose, mouth, throat, and face: Denies mucositis or sore throat Respiratory: Denies cough, dyspnea or wheezes Cardiovascular: Denies palpitation, chest discomfort or lower extremity swelling Gastrointestinal:  Denies nausea, heartburn or change in bowel habits Skin: Denies abnormal skin rashes Lymphatics: Denies new lymphadenopathy or easy bruising Neurological:Denies numbness, tingling or new weaknesses Behavioral/Psych: Mood is stable, no new changes  All other systems were reviewed with the patient and are negative.  I have reviewed the past medical history, past surgical history, social history and family history with the patient and they are unchanged from previous note.  ALLERGIES:  is allergic to other.  MEDICATIONS:  Current Outpatient Medications  Medication Sig Dispense Refill  . acetaminophen (TYLENOL) 500 MG tablet Take 1,000 mg by mouth daily as needed for moderate pain.    [name] albuterol (PROAIR HFA) 108 (90 Base) MCG/ACT inhaler Inhale 2 puffs into the lungs every 6 (six) hours as needed for wheezing or shortness of breath. 17 g 3  . albuterol (PROVENTIL) (2.5 MG/3ML) 0.083% nebulizer solution Take 3 mLs (2.5 mg total) by nebulization every 4 (four) hours as needed for wheezing or shortness of breath. 75 mL 5  . cholecalciferol (VITAMIN D3) 25 MCG (1000 UT) tablet Take 5,000 Units by mouth daily.    . Copper Gluconate 2 MG CAPS Take 4 mg by mouth daily.    . Cyanocobalamin (VITAMIN B-12 IJ) Inject 1 mg as directed every 30 (thirty) days.    . diclofenac sodium (VOLTAREN) 1 % GEL Apply 2 g topically 4 (four) times daily. 2 g 3  . dicyclomine (BENTYL) 20 MG tablet Take 1 tablet  (20 mg total) by mouth as needed for spasms. 120 tablet 1  . esomeprazole (NEXIUM) 40 MG capsule Take 1 capsule (40 mg total) by mouth 2 (two) times daily before a meal. 60 capsule 3  . famotidine (PEPCID) 40 MG tablet Take 1 tablet (40 mg total) by mouth 2 (two) times daily. 60 tablet 3  . Fluticasone-Umeclidin-Vilant (TRELEGY ELLIPTA) 100-62.5-25 MCG/INH AEPB Inhale 1 puff into the lungs daily.    . folic acid (FOLVITE) 1 MG tablet Take 1 tablet (1 mg total) by mouth daily. 90 tablet 2  . Potassium Chloride ER 20 MEQ TBCR TAKE 1 TABLET BY MOUTH EVERY DAY 30 tablet 1  . solifenacin (VESICARE) 5 MG tablet Take 1 tablet (5 mg total) by mouth daily. 30 tablet 5  . traZODone (DESYREL) 150 MG tablet Take 1 tablet (150 mg total) by mouth at bedtime. For sleep 30 tablet 0  . venlafaxine XR (EFFEXOR-XR) 75 MG 24 hr capsule Take 1 capsule (75 mg total) by mouth daily with breakfast. For depression 30 capsule 0   No current facility-administered medications for this visit.     PHYSICAL EXAMINATION: ECOG PERFORMANCE STATUS: 0 - Asymptomatic  Vitals:   09/05/19 1227  BP: 120/77  Pulse: 73  Resp: 18  Temp: 98.3 F (36.8 C)  SpO2: 97%   Filed Weights   09/05/19 1227  Weight: 212 lb 11.2 oz (96.5 kg)    GENERAL:alert, no distress and comfortable Musculoskeletal:no cyanosis of digits and no clubbing  NEURO: alert & oriented x 3 with fluent speech, no focal motor/sensory deficits  LABORATORY DATA:  I have  reviewed the data as listed    Component Value Date/Time   NA 142 09/04/2019 1353   NA 140 07/04/2019 1110   K 3.8 09/04/2019 1353   CL 105 09/04/2019 1353   CO2 26 09/04/2019 1353   GLUCOSE 98 09/04/2019 1353   BUN 8 09/04/2019 1353   BUN 13 07/04/2019 1110   CREATININE 0.67 09/04/2019 1353   CREATININE 0.70 09/21/2018 1000   CREATININE 0.59 08/07/2017 1410   CALCIUM 8.3 (L) 09/04/2019 1353   PROT 6.0 (L) 09/04/2019 1353   PROT 6.2 07/04/2019 1110   ALBUMIN 3.8 09/04/2019 1353    ALBUMIN 4.5 07/04/2019 1110   AST 29 09/04/2019 1353   ALT 39 09/04/2019 1353   ALKPHOS 86 09/04/2019 1353   BILITOT 0.3 09/04/2019 1353   BILITOT 0.4 07/04/2019 1110   GFRNONAA >60 09/04/2019 1353   GFRNONAA >60 09/21/2018 1000   GFRNONAA 110 08/07/2017 1410   GFRAA >60 09/04/2019 1353   GFRAA >60 09/21/2018 1000   GFRAA 127 08/07/2017 1410    No results found for: SPEP, UPEP  Lab Results  Component Value Date   WBC 5.3 09/04/2019   NEUTROABS 3.9 09/04/2019   HGB 12.3 09/04/2019   HCT 38.6 09/04/2019   MCV 90.6 09/04/2019   PLT 46 (L) 09/04/2019      Chemistry      Component Value Date/Time   NA 142 09/04/2019 1353   NA 140 07/04/2019 1110   K 3.8 09/04/2019 1353   CL 105 09/04/2019 1353   CO2 26 09/04/2019 1353   BUN 8 09/04/2019 1353   BUN 13 07/04/2019 1110   CREATININE 0.67 09/04/2019 1353   CREATININE 0.70 09/21/2018 1000   CREATININE 0.59 08/07/2017 1410      Component Value Date/Time   CALCIUM 8.3 (L) 09/04/2019 1353   ALKPHOS 86 09/04/2019 1353   AST 29 09/04/2019 1353   ALT 39 09/04/2019 1353   BILITOT 0.3 09/04/2019 1353   BILITOT 0.4 07/04/2019 1110       RADIOGRAPHIC STUDIES: I have reviewed multiple imaging studies with the patient I have personally reviewed the radiological images as listed and agreed with the findings in the report. Ct Chest W Contrast  Result Date: 09/04/2019 CLINICAL DATA:  Short of breath. Abdominal pain and thrombocytopenia. Evaluate for cancer, liver disease or splenomegaly. Chronic cough due to COPD and shortness of breath. EXAM: CT CHEST, ABDOMEN, AND PELVIS WITH CONTRAST TECHNIQUE: Multidetector CT imaging of the chest, abdomen and pelvis was performed following the standard protocol during bolus administration of intravenous contrast. CONTRAST:  137m OMNIPAQUE IOHEXOL 300 MG/ML  SOLN COMPARISON:  None. FINDINGS: CT CHEST FINDINGS Cardiovascular: Normal heart size. Small pericardial effusiona identified, image 44/2.  Aortic atherosclerosis. Lad coronary artery calcification. Mediastinum/Nodes: No enlarged mediastinal, hilar, or axillary lymph nodes. Thyroid gland, trachea, and esophagus demonstrate no significant findings. Lungs/Pleura: No pleural effusion identified. No airspace consolidation, atelectasis or pneumothorax. No suspicious pulmonary nodule or mass. Musculoskeletal: Spondylosis identified within the thoracic spine. No aggressive lytic or sclerotic bone lesions. CT ABDOMEN PELVIS FINDINGS Hepatobiliary: No focal liver abnormality is seen. Status post cholecystectomy. No biliary dilatation. Pancreas: Unremarkable. No pancreatic ductal dilatation or surrounding inflammatory changes. Spleen: The spleen measures 7 by 10.5 by 13.7 cm (volume = 500 cm^3). No focal splenic abnormality. Adrenals/Urinary Tract: Normal appearance of the adrenal glands. The kidneys are unremarkable. The urinary bladder appears normal. Stomach/Bowel: Status post gastric bypass. The appendix is identified within the right lower quadrant of the  abdomen. The appendix appears diffusely thickened measuring 11 mm in diameter with intramural fatty deposition throughout. No significant periappendiceal fat stranding or free fluid. No bowel wall thickening, inflammation or distension identified. Vascular/Lymphatic: Aortic atherosclerosis. No aneurysm. No abdominopelvic adenopathy. Reproductive: Status post hysterectomy. No adnexal masses. Other: No free fluid or fluid collections. Multiple small ventral abdominal wall hernias are identified which contain fat only. The largest measures 1.4 cm, image 106/5. Musculoskeletal: Lumbar spondylosis. No acute or suspicious osseous findings IMPRESSION: 1. The appendix appears abnormally thickened measuring 1.1 cm in diameter with diffuse intramural fatty deposition. No periappendiceal fat stranding or free fluid. Imaging findings may reflect chronic appendicitis. No perforation or abscess noted. 2. Small  pericardial effusion. 3. Aortic atherosclerosis. Lad coronary artery calcifications noted. 4. Splenomegaly. 5. Status post gastric bypass. 6. Multiple small ventral abdominal wall hernias which contain fat only. Aortic Atherosclerosis (ICD10-I70.0). Electronically Signed   By: Kerby Moors M.D.   On: 09/04/2019 17:02   Ct Abdomen Pelvis W Contrast  Result Date: 09/04/2019 CLINICAL DATA:  Short of breath. Abdominal pain and thrombocytopenia. Evaluate for cancer, liver disease or splenomegaly. Chronic cough due to COPD and shortness of breath. EXAM: CT CHEST, ABDOMEN, AND PELVIS WITH CONTRAST TECHNIQUE: Multidetector CT imaging of the chest, abdomen and pelvis was performed following the standard protocol during bolus administration of intravenous contrast. CONTRAST:  176m OMNIPAQUE IOHEXOL 300 MG/ML  SOLN COMPARISON:  None. FINDINGS: CT CHEST FINDINGS Cardiovascular: Normal heart size. Small pericardial effusiona identified, image 44/2. Aortic atherosclerosis. Lad coronary artery calcification. Mediastinum/Nodes: No enlarged mediastinal, hilar, or axillary lymph nodes. Thyroid gland, trachea, and esophagus demonstrate no significant findings. Lungs/Pleura: No pleural effusion identified. No airspace consolidation, atelectasis or pneumothorax. No suspicious pulmonary nodule or mass. Musculoskeletal: Spondylosis identified within the thoracic spine. No aggressive lytic or sclerotic bone lesions. CT ABDOMEN PELVIS FINDINGS Hepatobiliary: No focal liver abnormality is seen. Status post cholecystectomy. No biliary dilatation. Pancreas: Unremarkable. No pancreatic ductal dilatation or surrounding inflammatory changes. Spleen: The spleen measures 7 by 10.5 by 13.7 cm (volume = 500 cm^3). No focal splenic abnormality. Adrenals/Urinary Tract: Normal appearance of the adrenal glands. The kidneys are unremarkable. The urinary bladder appears normal. Stomach/Bowel: Status post gastric bypass. The appendix is identified  within the right lower quadrant of the abdomen. The appendix appears diffusely thickened measuring 11 mm in diameter with intramural fatty deposition throughout. No significant periappendiceal fat stranding or free fluid. No bowel wall thickening, inflammation or distension identified. Vascular/Lymphatic: Aortic atherosclerosis. No aneurysm. No abdominopelvic adenopathy. Reproductive: Status post hysterectomy. No adnexal masses. Other: No free fluid or fluid collections. Multiple small ventral abdominal wall hernias are identified which contain fat only. The largest measures 1.4 cm, image 106/5. Musculoskeletal: Lumbar spondylosis. No acute or suspicious osseous findings IMPRESSION: 1. The appendix appears abnormally thickened measuring 1.1 cm in diameter with diffuse intramural fatty deposition. No periappendiceal fat stranding or free fluid. Imaging findings may reflect chronic appendicitis. No perforation or abscess noted. 2. Small pericardial effusion. 3. Aortic atherosclerosis. Lad coronary artery calcifications noted. 4. Splenomegaly. 5. Status post gastric bypass. 6. Multiple small ventral abdominal wall hernias which contain fat only. Aortic Atherosclerosis (ICD10-I70.0). Electronically Signed   By: TKerby MoorsM.D.   On: 09/04/2019 17:02    All questions were answered. The patient knows to call the clinic with any problems, questions or concerns. No barriers to learning was detected.  I spent 15 minutes counseling the patient face to face. The total time spent in the appointment  was 20 minutes and more than 50% was on counseling and review of test results  Heath Lark, MD 09/05/2019 2:05 PM

## 2019-09-05 NOTE — Assessment & Plan Note (Signed)
She has complete normalization of hemoglobin and white blood cell count after adequate vitamin B12 and copper replacement She have persistent thrombocytopenia Currently, she does not fulfill the criteria for ITP She is not symptomatic Observe only for now

## 2019-09-05 NOTE — Assessment & Plan Note (Signed)
CT imaging show evidence of splenomegaly She also have liver enlargement The overall picture is most consistent with fatty liver disease causing chronic congestive splenomegaly and that causes sequestration of her platelets She does not need platelet transfusion The patient stated she will be undergoing some GI procedure soon I will try to call her gastroenterology physician to discuss this

## 2019-09-05 NOTE — Assessment & Plan Note (Signed)
After discussion with [facility], the overall consensus is that she does not have myelodysplastic syndrome

## 2019-09-06 ENCOUNTER — Telehealth: Payer: Self-pay | Admitting: Hematology and Oncology

## 2019-09-06 ENCOUNTER — Telehealth: Payer: Self-pay | Admitting: *Deleted

## 2019-09-06 NOTE — Telephone Encounter (Signed)
Scheduled appt per 11/19 sch message - pt is aware of appts added.

## 2019-09-06 NOTE — Telephone Encounter (Signed)
Attempted to call the patient who did not answer.   Left message to call back to scheduled pre-op visit, endo colon, and pre-procedure COVID screen.

## 2019-09-06 NOTE — Telephone Encounter (Signed)
-----   Message from Thornton Park, MD sent at 09/06/2019 12:39 PM EST ----- Regarding: RE: mutual patient Dr. Alvy Bimler,  Thank you for your time and your attention to the care of our mutual patient.    H[place], please arrange for EGD and colonoscopy with me at the [facility]. Ideally, an afternoon appointment would work best. Dr. Alvy Bimler will then arrange for a platelet transfuion immediately prior to the procedure.   Thank you.  KLB ----- Message ----- From: Heath Lark, MD Sent: 09/05/2019   2:00 PM EST To: Thornton Park, MD Subject: mutual patient                                 Hi,  I tried calling the GI service number but I could not get hold of anybody I would appreciate you if you can call me at your convenience about this patient  Thanks, Heath Lark [phone]

## 2019-09-06 NOTE — Telephone Encounter (Signed)
Pt returning your call

## 2019-09-06 NOTE — Telephone Encounter (Signed)
Pt scheduled for previsit and endo colon, letter sent

## 2019-09-09 ENCOUNTER — Ambulatory Visit (AMBULATORY_SURGERY_CENTER): Payer: Medicaid Other | Admitting: *Deleted

## 2019-09-09 ENCOUNTER — Other Ambulatory Visit: Payer: Self-pay

## 2019-09-09 VITALS — Ht 66.0 in | Wt 213.6 lb

## 2019-09-09 DIAGNOSIS — R109 Unspecified abdominal pain: Secondary | ICD-10-CM

## 2019-09-09 DIAGNOSIS — R197 Diarrhea, unspecified: Secondary | ICD-10-CM

## 2019-09-09 DIAGNOSIS — Z1159 Encounter for screening for other viral diseases: Secondary | ICD-10-CM

## 2019-09-09 MED ORDER — SUPREP BOWEL PREP KIT 17.5-3.13-1.6 GM/177ML PO SOLN: 1.0000 | Freq: Once | ORAL | 0 refills | Status: AC

## 2019-09-09 NOTE — Progress Notes (Signed)
Patient denies any allergies to egg or soy products. Patient denies complications with anesthesia/sedation.  Patient denies oxygen use at home and denies diet medications. Emmi instructions for colonoscopy/endoscopy explained and given to patient.   

## 2019-09-23 ENCOUNTER — Ambulatory Visit (INDEPENDENT_AMBULATORY_CARE_PROVIDER_SITE_OTHER): Payer: Medicaid Other

## 2019-09-23 ENCOUNTER — Other Ambulatory Visit: Payer: Self-pay | Admitting: Gastroenterology

## 2019-09-23 DIAGNOSIS — Z1159 Encounter for screening for other viral diseases: Secondary | ICD-10-CM

## 2019-09-24 LAB — SARS CORONAVIRUS 2 (TAT 6-24 HRS): SARS Coronavirus 2: NEGATIVE

## 2019-09-25 ENCOUNTER — Ambulatory Visit (INDEPENDENT_AMBULATORY_CARE_PROVIDER_SITE_OTHER): Payer: Medicaid Other | Admitting: Obstetrics and Gynecology

## 2019-09-25 ENCOUNTER — Other Ambulatory Visit: Payer: Self-pay

## 2019-09-25 ENCOUNTER — Encounter: Payer: Self-pay | Admitting: Obstetrics and Gynecology

## 2019-09-25 VITALS — BP 118/71 | HR 80 | Ht 66.0 in | Wt 218.0 lb

## 2019-09-25 DIAGNOSIS — N3946 Mixed incontinence: Secondary | ICD-10-CM | POA: Diagnosis not present

## 2019-09-25 NOTE — Progress Notes (Signed)
GYN presents for FU.  Reports no problems today, rx is working.

## 2019-09-25 NOTE — Progress Notes (Signed)
Patient ID: Barbara Stanley, female   DOB: 01-24-1971, 48 y.o.   MRN: [id number] Barbara Stanley presents for f/u of mixed urinary incont. See prior office note Started on Vesicare and GYN PT She reports today doing well. No more urinary accidents Tolerating mediation as well. Has completed GYN PT and feels this helped as well  PE AF VSS Lungs clear Heart RRR Abd soft + BS  A/P Mixed urinary incont Doing well with Vesicare. Will continue with current management F/U in 6 monhts

## 2019-09-26 ENCOUNTER — Ambulatory Visit (AMBULATORY_SURGERY_CENTER): Payer: Medicaid Other | Admitting: Gastroenterology

## 2019-09-26 ENCOUNTER — Encounter: Payer: Self-pay | Admitting: Gastroenterology

## 2019-09-26 VITALS — BP 120/70 | HR 55 | Temp 98.4°F | Resp 21 | Ht 66.0 in | Wt 213.6 lb

## 2019-09-26 DIAGNOSIS — R197 Diarrhea, unspecified: Secondary | ICD-10-CM

## 2019-09-26 DIAGNOSIS — K219 Gastro-esophageal reflux disease without esophagitis: Secondary | ICD-10-CM | POA: Diagnosis not present

## 2019-09-26 DIAGNOSIS — R109 Unspecified abdominal pain: Secondary | ICD-10-CM

## 2019-09-26 DIAGNOSIS — K449 Diaphragmatic hernia without obstruction or gangrene: Secondary | ICD-10-CM | POA: Diagnosis not present

## 2019-09-26 MED ORDER — SODIUM CHLORIDE 0.9 % IV SOLN: 500.0000 mL | Freq: Once | INTRAVENOUS | Status: DC

## 2019-09-26 NOTE — Progress Notes (Signed)
To PACU, VSS. Report to [email or web address] 

## 2019-09-26 NOTE — Progress Notes (Signed)
VS-DT Temp-JB   Pt's states no medical or surgical changes since previsit or office visit.  

## 2019-09-26 NOTE — Patient Instructions (Signed)
Handout given:  Hiatal Hernia Resume previous diet Continue current medications     YOU HAD AN ENDOSCOPIC PROCEDURE TODAY AT [facility]:   Refer to the procedure report that was given to you for any specific questions about what was found during the examination.  If the procedure report does not answer your questions, please call your gastroenterologist to clarify.  If you requested that your care partner not be given the details of your procedure findings, then the procedure report has been included in a sealed envelope for you to review at your convenience later.  YOU SHOULD EXPECT: Some feelings of bloating in the abdomen. Passage of more gas than usual.  Walking can help get rid of the air that was put into your GI tract during the procedure and reduce the bloating. If you had a lower endoscopy (such as a colonoscopy or flexible sigmoidoscopy) you may notice spotting of blood in your stool or on the toilet paper. If you underwent a bowel prep for your procedure, you may not have a normal bowel movement for a few days.  Please Note:  You might notice some irritation and congestion in your nose or some drainage.  This is from the oxygen used during your procedure.  There is no need for concern and it should clear up in a day or so.  SYMPTOMS TO REPORT IMMEDIATELY:   Following lower endoscopy (colonoscopy or flexible sigmoidoscopy):  Excessive amounts of blood in the stool  Significant tenderness or worsening of abdominal pains  Swelling of the abdomen that is new, acute  Fever of 100F or higher   Following upper endoscopy (EGD)  Vomiting of blood or coffee ground material  New chest pain or pain under the shoulder blades  Painful or persistently difficult swallowing  New shortness of breath  Fever of 100F or higher  Black, tarry-looking stools  For urgent or emergent issues, a gastroenterologist can be reached at any hour by calling [phone].   DIET:  We  do recommend a small meal at first, but then you may proceed to your regular diet.  Drink plenty of fluids but you should avoid alcoholic beverages for 24 hours.  ACTIVITY:  You should plan to take it easy for the rest of today and you should NOT DRIVE or use heavy machinery until tomorrow (because of the sedation medicines used during the test).    FOLLOW UP: Our staff will call the number listed on your records 48-72 hours following your procedure to check on you and address any questions or concerns that you may have regarding the information given to you following your procedure. If we do not reach you, we will leave a message.  We will attempt to reach you two times.  During this call, we will ask if you have developed any symptoms of COVID 19. If you develop any symptoms (ie: fever, flu-like symptoms, shortness of breath, cough etc.) before then, please call [phone].  If you test positive for Covid 19 in the 2 weeks post procedure, please call and report this information to [place].    If any biopsies were taken you will be contacted by phone or by letter within the next 1-3 weeks.  Please call us at [phone] if you have not heard about the biopsies in 3 weeks.    SIGNATURES/CONFIDENTIALITY: You and/or your care partner have signed paperwork which will be entered into your electronic medical record.  These signatures attest to the fact  that that the information above on your After Visit Summary has been reviewed and is understood.  Full responsibility of the confidentiality of this discharge information lies with you and/or your care-partner.

## 2019-09-26 NOTE — Op Note (Signed)
[place] Patient Name: Barbara Stanley Procedure Date: 09/26/2019 1:25 PM MRN: [id number] Endoscopist: Thornton Park MD, MD Age: 48 Referring MD:  Date of Birth: [birth year] Gender: Female Account #: [id number] Procedure:                Upper GI endoscopy Indications:              Acute on chronic diarrhea with recent                            hospitalization for associated hypokalemia                           - diarrhea initially developed as a teenager with 4                            BM at baseline                           - C diff and culture negative                           - improved with loperamide and lomotil and empiric                            metronidazole                           GERD with small hiatal hernia Medicines:                Monitored Anesthesia Care Procedure:                Pre-Anesthesia Assessment:                           - Prior to the procedure, a History and Physical                            was performed, and patient medications and                            allergies were reviewed. The patient's tolerance of                            previous anesthesia was also reviewed. The risks                            and benefits of the procedure and the sedation                            options and risks were discussed with the patient.                            All questions were answered, and informed consent                            was obtained. Prior  Anticoagulants: The patient has                            taken no previous anticoagulant or antiplatelet                            agents. ASA Grade Assessment: III - A patient with                            severe systemic disease. After reviewing the risks                            and benefits, the patient was deemed in                            satisfactory condition to undergo the procedure.                           After obtaining informed consent, the endoscope  was                            passed under direct vision. Throughout the                            procedure, the patient's blood pressure, pulse, and                            oxygen saturations were monitored continuously. The                            Endoscope was introduced through the mouth, and                            advanced to the third part of duodenum. The upper                            GI endoscopy was accomplished without difficulty.                            The patient tolerated the procedure well. Scope In: Scope Out: Findings:                 The esophagus was normal.                           Evidence of a gastric bypass was found. Small                            haital hernia is present. A gastric pouch with a                            small size was found. The gastrojejunal anastomosis  was characterized by healthy appearing mucosa. This                            was traversed. The pouch-to-jejunum limb was                            characterized by healthy appearing mucosa. The                            jejunojejunal anastomosis was characterized by                            healthy appearing mucosa. Biopsies were taken with                            a cold forceps for histology.                           The examined jejunum was normal. This was biopsied                            with a cold forceps for histology. Estimated blood                            loss was minimal. Complications:            No immediate complications. Estimated blood loss:                            Minimal. Estimated Blood Loss:     Estimated blood loss was minimal. Impression:               - Normal esophagus.                           - Small hiatal hernia present.                           - Gastric bypass with a small-sized pouch.                            Gastrojejunal anastomosis characterized by healthy                             appearing mucosa. Biopsied.                           - Normal examined jejunum. Biopsied. Recommendation:           - Patient has a contact number available for                            emergencies. The signs and symptoms of potential                            delayed complications were discussed with the  patient. Return to normal activities tomorrow.                            Written discharge instructions were provided to the                            patient.                           - Resume previous diet.                           - Continue present medications.                           - Await pathology results.                           - Proceed with colonoscopy as previously planned. Thornton Park MD, MD 09/26/2019 1:58:54 PM This report has been signed electronically.

## 2019-09-26 NOTE — Op Note (Signed)
Barbara Stanley Name: Barbara Stanley Procedure Date: 09/26/2019 1:32 PM MRN: [id number] Endoscopist: Thornton Park MD, MD Age: 48 Referring MD:  Date of Birth: 1971/04/27 Gender: Female Account #: [id number] Procedure:                Colonoscopy Indications:              Acute on chronic diarrhea with recent                            hospitalization for associated hypokalemia                           - diarrhea initially developed as a teenager with 4                            BM at baseline                           - C diff and culture negative                           - improved with loperamide and lomotil and empiric                            metronidazole                           No prior colon cancer screening Medicines:                Monitored Anesthesia Care Procedure:                Pre-Anesthesia Assessment:                           - Prior to the procedure, a History and Physical                            was performed, and Stanley medications and                            allergies were reviewed. The Stanley's tolerance of                            previous anesthesia was also reviewed. The risks                            and benefits of the procedure and the sedation                            options and risks were discussed with the Stanley.                            All questions were answered, and informed consent                            was obtained. Prior Anticoagulants: The  Stanley has                            taken no previous anticoagulant or antiplatelet                            agents. ASA Grade Assessment: III - A Stanley with                            severe systemic disease. After reviewing the risks                            and benefits, the Stanley was deemed in                            satisfactory condition to undergo the procedure.                           After obtaining informed consent, the colonoscope                           was passed under direct vision. Throughout the                            procedure, the Stanley's blood pressure, pulse, and                            oxygen saturations were monitored continuously. The                            Colonoscope was introduced through the anus and                            advanced to the the terminal ileum, with                            identification of the appendiceal orifice and IC                            valve. A second forward view of the right colon was                            performed. The colonoscopy was performed without                            difficulty. The Stanley tolerated the procedure                            well. The quality of the bowel preparation was                            good. The terminal ileum, ileocecal valve,  appendiceal orifice, and rectum were photographed. Scope In: 1:33:42 PM Scope Out: 1:51:36 PM Scope Withdrawal Time: 0 hours 14 minutes 55 seconds  Total Procedure Duration: 0 hours 17 minutes 54 seconds  Findings:                 The perianal and digital rectal examinations were                            normal.                           The colon (entire examined portion) appeared                            normal. Biopsies for histology were taken with a                            cold forceps from the right colon and left colon                            for evaluation of microscopic colitis. Estimated                            blood loss was minimal.                           The exam was otherwise without abnormality on                            direct and retroflexion views.                           The terminal ileum appeared normal. Complications:            No immediate complications. Estimated blood loss:                            Minimal. Estimated Blood Loss:     Estimated blood loss was minimal. Impression:               - The entire  examined colon is normal. Biopsied.                           - The examination was otherwise normal on direct                            and retroflexion views. Recommendation:           - Stanley has a contact number available for                            emergencies. The signs and symptoms of potential                            delayed complications were discussed with the  Stanley. Return to normal activities tomorrow.                            Written discharge instructions were provided to the                            Stanley.                           - Resume previous diet.                           - Continue present medications.                           - Await pathology results.                           - Repeat colonoscopy in 10 years for screening                            purposes. Thornton Park MD, MD 09/26/2019 2:01:34 PM This report has been signed electronically.

## 2019-09-30 ENCOUNTER — Telehealth: Payer: Self-pay

## 2019-09-30 NOTE — Telephone Encounter (Signed)
Covid-19 screening questions   Do you now or have you had a fever in the last 14 days? No.  Do you have any respiratory symptoms of shortness of breath or cough now or in the last 14 days? No.  Do you have any family members or close contacts with diagnosed or suspected Covid-19 in the past 14 days? No. Have you been tested for Covid-19 and found to be positive? No.      Follow up Call-  Call back number 09/26/2019  Post procedure Call Back phone  # [phone]  Permission to leave phone message Yes  Some recent data might be hidden     Patient questions:  Do you have a fever, pain , or abdominal swelling? No. Pain Score  0 *  Have you tolerated food without any problems? Yes.    Have you been able to return to your normal activities? Yes.    Do you have any questions about your discharge instructions: Diet   No. Medications  No. Follow up visit  No.  Do you have questions or concerns about your Care? No.  Actions: * If pain score is 4 or above: No action needed, pain <4.

## 2019-10-02 ENCOUNTER — Other Ambulatory Visit: Payer: Self-pay | Admitting: *Deleted

## 2019-10-02 ENCOUNTER — Encounter: Payer: Self-pay | Admitting: *Deleted

## 2019-10-02 MED ORDER — COLESTIPOL HCL 1 G PO TABS: 2.0000 g | ORAL_TABLET | Freq: Every day | ORAL | 0 refills | Status: DC

## 2019-10-18 DIAGNOSIS — K529 Noninfective gastroenteritis and colitis, unspecified: Secondary | ICD-10-CM | POA: Insufficient documentation

## 2019-10-20 ENCOUNTER — Other Ambulatory Visit: Payer: Self-pay | Admitting: Gastroenterology

## 2019-10-21 ENCOUNTER — Other Ambulatory Visit: Payer: Self-pay | Admitting: *Deleted

## 2019-10-21 ENCOUNTER — Other Ambulatory Visit: Payer: Medicaid Other

## 2019-10-21 MED ORDER — COLESTIPOL HCL 1 G PO TABS: 2.0000 g | ORAL_TABLET | Freq: Two times a day (BID) | ORAL | 3 refills | Status: DC

## 2019-10-28 ENCOUNTER — Ambulatory Visit: Payer: Medicaid Other | Admitting: Hematology and Oncology

## 2019-10-28 ENCOUNTER — Ambulatory Visit: Payer: Medicaid Other | Admitting: Gastroenterology

## 2019-10-28 ENCOUNTER — Encounter: Payer: Self-pay | Admitting: Gastroenterology

## 2019-10-28 VITALS — BP 118/68 | HR 80 | Temp 98.4°F | Ht 66.0 in | Wt 227.0 lb

## 2019-10-28 DIAGNOSIS — R197 Diarrhea, unspecified: Secondary | ICD-10-CM

## 2019-10-28 DIAGNOSIS — R109 Unspecified abdominal pain: Secondary | ICD-10-CM | POA: Diagnosis not present

## 2019-10-28 NOTE — Patient Instructions (Addendum)
Continue your current medications. Please let me know if you have trouble filling for colestipol.   Please call with any questions or concerns.

## 2019-10-28 NOTE — Progress Notes (Addendum)
Referring Provider: Dorena Dew, FNP Primary Care Physician:  Dorena Dew, FNP   Chief complaint:  Diarrhea   IMPRESSION:  Acute on chronic diarrhea with recent hospitalization for associated hypokalemia    - diarrhea initially developed as a teenager with 4 BM at baseline    - C diff and culture negative    - improved with loperamide and lomotil and empiric metronidazole    - normal duodenal and random colon biopsies 09/26/19 GERD with small hiatal hernia Recent diagnosis of B12, folate, and iron deficiencies presenting with pancytopenia Pancytopenia with persistent thrombocytopenia Gastric bypass 2003 Recent elevated transaminases while hospitalized, now normal    - 08/09/18 abdominal ultrasound revealed no source Prior cholecystectomy BMI 31 Tobacco habituation No prior colon cancer screening  Reflux controlled on Nexium 40 mg BID and famotidine 20 mg BID, although she prefers to dose them on a PRN basis.  Evaluation for cause of diarrhea has excluded celiac, IBD and microscopic colitis. Diarrhea improved on colestipol 2 g BID. Continue dicyclomine PRN for post-prandial symptoms. No further evaluation planned given her current satisfaction with symptom control. No alarm features.   She will be due another colonoscopy for colon cancer screening in 2030.    PLAN: Continue Nexium 40 mg BID and famotodine 20 mg BID Continue Dicyclomine 20 mg QID  Continue Colestipol 2 mg BID Smoking cessation Colonoscopy 2030 Follow-up PRN   HPI: Barbara Stanley is a 50 y.o. female under evaluation of diarrhea.  She returns in scheduled follow-up after recent endoscopic evaluation.  The interval history is obtained through the patient and review of her electronic health record. Gastric bypass performed 16 years ago [place]. Not on any long term supplements until she was hospitalized last year for diarrhea-induced hypokalemia and pancytopenia. Diarrhea has improved since  starting vitamin [id number] and folic acid for presumptive severe pancytopenia secondary to undernutrition and vitamin deficiencies with b12, folate, and iron. She has a history of chronic obstructive pulmonary disease; major depression; gastric bypass surgery 16 years ago; unilateral right hearing deficit; chronic anemia; gastroesophageal reflux disease; active tobacco dependence; degenerative joint disease involving the left shoulder, lower back, and knees bilaterally.  Seen for loose BM occuring 3-4 times in the morning. At it's worst, she was having 20 bowel movements daily. Most days she will not have any further diarrhea. Baseline bowel habits following th bypass are diarrhea - 4 slightly formed BM at baseline. Was also having diffuse and upper abdominal abdominal pain that occurs immediately after eating or while eating despite Nexium QAM and famotidine QPM. Consistent despite volume or choice of foods. Associated nausea. Occassional postprandial bowel movement that improves the pain. No excessive gas or bloating.  No sitophobia. Appetite. Weight fluctuates by a couple of pounds. No blood or mucous. No constipation.   At the time of her last office visit she was having rare heartburn minimal nausea and significant improvement in her abdominal pain. Still occurs after eating but not every day. Pain and nausea are resolved 15 minutes after defecation.  Her appetite increased and she had gained 3 pounds.  An upper GI series 07/12/2019 showed moderate gastroesophageal reflux, small hiatal hernia and inability to completely assess for esophageal motility given the patient's nausea during the exam.  However there was no significant delay in contrast passage through the esophagus.  Returns in follow-up after endoscopic evaluation 09/26/2019.  Upper endoscopy revealed a normal esophagus, small hiatal hernia, evidence of gastric bypass with a small pouch that appeared normal  and a normal appearing anastomosis.   Duodenal biopsies were normal.  A colonoscopy performed at the tame time was normal.  Random colon biopsies from the right and left colon for microscopic colitis were normal.  On colestipol 2 mg BID with two soft, formed worms daily. Abdominal pain has resolved. She is very pleased with this daily habit. Worried about the Producer, television/film/video of colestipol and what she will do it she runs out. Using her other GI meds PRN.   Recent labs: The results of the C. difficile quick screen with PCR reflex was negative from October 31.  Qualitative fecal lactoferrin was positive.  A stool culture for E. coli; Shigella toxin; Salmonella and Shigella were not identified. Campylobacter culture was negative. Empirically she was given a brief course of metronidazole.   Labs 06/07/19: WBC 5.7, hgb 13.6, platelets 49 Labs 07/04/19: normal CMP Labs 07/19/19: normal copper, Vit D, B12; normal CBC except for platelets of 38 Labs 08/25/2019: normal comprehensive metabolic panel except for a calcium of 8.3 and a total protein of 6.  White count 5.3, hemoglobin 12.3, MCV 90, RDW 14.1, platelets 46.  Prior endoscopy:  EGD for reflux several years ago "somewhere in [place]."  EGD 09/26/2019: normal esophagus, small hiatal hernia, evidence of gastric bypass with a small pouch normal-appearing anastomosis, and normal small bowel.  Duodenal biopsies were normal. Colonoscopy 09/26/2019: normal.  Random colon biopsies for microscopic colitis were normal.   Abdominal imaging: Abdominal ultrasound for abnormal liver enzymes 08/09/18:  common bile duct of 6.6 mm status post cholecystectomy.  Spleen is upper limit of normal at 12.6 cm. Transaminases at that time were elevated. They have since been repeatedly normal on several rechecks.  UGI series 07/12/19: Moderate gastroesophageal reflux. Small hiatal hernia. Esophageal motility could not be completely assessed as the patient became nauseated during the exam and could not perform this  portion. However throughout the exam no significant delay in contrast passage through the esophagus was observed.  Past Medical History:  Diagnosis Date  . Abdominal pain   . Anemia   . Anxiety   . Bronchitis   . COPD (chronic obstructive pulmonary disease) ([place])   . Depression    bipolar  . Diarrhea   . Emphysema   . GERD (gastroesophageal reflux disease)   . History of blood transfusion   . Splenomegaly    r/t low platelets  . Thrombocytopenia ([place])   . Tobacco abuse     Past Surgical History:  Procedure Laterality Date  . ABDOMINAL HYSTERECTOMY    . c section x 3     . CHOLECYSTECTOMY    . GASTRIC BYPASS    . UPPER GASTROINTESTINAL ENDOSCOPY    . WISDOM TOOTH EXTRACTION      Current Outpatient Medications  Medication Sig Dispense Refill  . acetaminophen (TYLENOL) 500 MG tablet Take 1,000 mg by mouth daily as needed for moderate pain.    [name] albuterol (PROAIR HFA) 108 (90 Base) MCG/ACT inhaler Inhale 2 puffs into the lungs every 6 (six) hours as needed for wheezing or shortness of breath. (Patient not taking: Reported on 09/09/2019) 17 g 3  . albuterol (PROVENTIL) (2.5 MG/3ML) 0.083% nebulizer solution Take 3 mLs (2.5 mg total) by nebulization every 4 (four) hours as needed for wheezing or shortness of breath. 75 mL 5  . cholecalciferol (VITAMIN D3) 25 MCG (1000 UT) tablet Take 5,000 Units by mouth daily.    . colestipol (COLESTID) 1 g tablet Take 2 tablets (2 g  total) by mouth 2 (two) times daily. 360 tablet 3  . Copper Gluconate 2 MG CAPS Take 4 mg by mouth daily.    . Cyanocobalamin (VITAMIN B-12 IJ) Inject 1 mg as directed every 30 (thirty) days.    . diclofenac sodium (VOLTAREN) 1 % GEL Apply 2 g topically 4 (four) times daily. 2 g 3  . dicyclomine (BENTYL) 20 MG tablet Take 1 tablet (20 mg total) by mouth as needed for spasms. 120 tablet 1  . esomeprazole (NEXIUM) 40 MG capsule Take 1 capsule (40 mg total) by mouth 2 (two) times daily before a meal. 60 capsule 3  .  famotidine (PEPCID) 40 MG tablet Take 1 tablet (40 mg total) by mouth 2 (two) times daily. 60 tablet 3  . Fluticasone-Umeclidin-Vilant (TRELEGY ELLIPTA) 100-62.5-25 MCG/INH AEPB Inhale 1 puff into the lungs daily.    . folic acid (FOLVITE) 1 MG tablet Take 1 tablet (1 mg total) by mouth daily. 90 tablet 2  . solifenacin (VESICARE) 5 MG tablet Take 1 tablet (5 mg total) by mouth daily. 30 tablet 5  . traZODone (DESYREL) 150 MG tablet Take 1 tablet (150 mg total) by mouth at bedtime. For sleep 30 tablet 0  . venlafaxine XR (EFFEXOR-XR) 75 MG 24 hr capsule Take 1 capsule (75 mg total) by mouth daily with breakfast. For depression 30 capsule 0   No current facility-administered medications for this visit.    Allergies as of 10/28/2019 - Review Complete 09/26/2019  Allergen Reaction Noted  . Other  08/08/2018    Family History  Problem Relation Age of Onset  . Diabetes Mother   . Diabetes Other   . Hypertension Other   . CAD Other   . Colon cancer Neg Hx   . Rectal cancer Neg Hx   . Stomach cancer Neg Hx   . Esophageal cancer Neg Hx     Social History   Socioeconomic History  . Marital status: Married    Spouse name: Not on file  . Number of children: Not on file  . Years of education: Not on file  . Highest education level: Not on file  Occupational History  . Not on file  Tobacco Use  . Smoking status: Current Every Day Smoker    Packs/day: 1.00    Years: 30.00    Pack years: 30.00    Types: Cigarettes  . Smokeless tobacco: Never Used  Substance and Sexual Activity  . Alcohol use: Yes    Comment: occasional wine  . Drug use: No  . Sexual activity: Yes    Birth control/protection: None    Comment: Hysterectomy  Other Topics Concern  . Not on file  Social History Narrative  . Not on file   Social Determinants of Health   Financial Resource Strain:   . Difficulty of Paying Living Expenses: Not on file  Food Insecurity:   . Worried About Charity fundraiser in  the Last Year: Not on file  . Ran Out of Food in the Last Year: Not on file  Transportation Needs:   . Lack of Transportation (Medical): Not on file  . Lack of Transportation (Non-Medical): Not on file  Physical Activity:   . Days of Exercise per Week: Not on file  . Minutes of Exercise per Session: Not on file  Stress:   . Feeling of Stress : Not on file  Social Connections:   . Frequency of Communication with Friends and Family: Not on file  .  Frequency of Social Gatherings with Friends and Family: Not on file  . Attends Religious Services: Not on file  . Active Member of Clubs or Organizations: Not on file  . Attends Archivist Meetings: Not on file  . Marital Status: Not on file  Intimate Partner Violence:   . Fear of Current or Ex-Partner: Not on file  . Emotionally Abused: Not on file  . Physically Abused: Not on file  . Sexually Abused: Not on file     Physical Exam: Vital signs were reviewed. General:   Alert, well-nourished, pleasant and cooperative in NAD Abdomen:  Soft, nontender, central obesity, normal bowel sounds. No rebound or guarding. No hepatosplenomegaly. Well healed midline surgical scar.  Neurologic:  Alert and  oriented x4;  grossly nonfocal Skin:  No rash or bruise. Palmar erythema present. No spider angioma.  Psych:  Alert and cooperative. Normal mood and affect.   Henrry Feil L. Tarri Glenn Md, MPH [facility] 10/28/2019, 1:17 PM

## 2019-11-05 ENCOUNTER — Other Ambulatory Visit: Payer: Self-pay | Admitting: Oral Surgery

## 2019-11-05 DIAGNOSIS — K047 Periapical abscess without sinus: Secondary | ICD-10-CM

## 2019-11-05 DIAGNOSIS — D696 Thrombocytopenia, unspecified: Secondary | ICD-10-CM

## 2019-11-06 ENCOUNTER — Other Ambulatory Visit: Payer: Self-pay | Admitting: Oral Surgery

## 2019-11-07 ENCOUNTER — Other Ambulatory Visit: Payer: Self-pay

## 2019-11-07 ENCOUNTER — Ambulatory Visit: Payer: Medicaid Other

## 2019-11-07 ENCOUNTER — Other Ambulatory Visit: Payer: Self-pay | Admitting: Gastroenterology

## 2019-11-07 ENCOUNTER — Telehealth: Payer: Self-pay | Admitting: Hematology and Oncology

## 2019-11-07 ENCOUNTER — Inpatient Hospital Stay: Payer: Medicaid Other | Attending: Hematology and Oncology

## 2019-11-07 DIAGNOSIS — D696 Thrombocytopenia, unspecified: Secondary | ICD-10-CM | POA: Diagnosis not present

## 2019-11-07 DIAGNOSIS — Z79899 Other long term (current) drug therapy: Secondary | ICD-10-CM | POA: Diagnosis not present

## 2019-11-07 DIAGNOSIS — Z9884 Bariatric surgery status: Secondary | ICD-10-CM | POA: Diagnosis not present

## 2019-11-07 DIAGNOSIS — E610 Copper deficiency: Secondary | ICD-10-CM | POA: Insufficient documentation

## 2019-11-07 DIAGNOSIS — K909 Intestinal malabsorption, unspecified: Secondary | ICD-10-CM | POA: Insufficient documentation

## 2019-11-07 DIAGNOSIS — D469 Myelodysplastic syndrome, unspecified: Secondary | ICD-10-CM | POA: Diagnosis present

## 2019-11-07 DIAGNOSIS — E559 Vitamin D deficiency, unspecified: Secondary | ICD-10-CM | POA: Diagnosis not present

## 2019-11-07 DIAGNOSIS — D509 Iron deficiency anemia, unspecified: Secondary | ICD-10-CM | POA: Insufficient documentation

## 2019-11-07 DIAGNOSIS — E538 Deficiency of other specified B group vitamins: Secondary | ICD-10-CM | POA: Diagnosis not present

## 2019-11-07 DIAGNOSIS — D61818 Other pancytopenia: Secondary | ICD-10-CM

## 2019-11-07 LAB — CBC WITH DIFFERENTIAL/PLATELET
Abs Immature Granulocytes: 0.01 10*3/uL (ref 0.00–0.07)
Basophils Absolute: 0 10*3/uL (ref 0.0–0.1)
Basophils Relative: 1 %
Eosinophils Absolute: 0.1 10*3/uL (ref 0.0–0.5)
Eosinophils Relative: 1 %
HCT: 37.5 % (ref 36.0–46.0)
Hemoglobin: 11.5 g/dL — ABNORMAL LOW (ref 12.0–15.0)
Immature Granulocytes: 0 %
Lymphocytes Relative: 18 %
Lymphs Abs: 0.7 10*3/uL (ref 0.7–4.0)
MCH: 26.9 pg (ref 26.0–34.0)
MCHC: 30.7 g/dL (ref 30.0–36.0)
MCV: 87.8 fL (ref 80.0–100.0)
Monocytes Absolute: 0.2 10*3/uL (ref 0.1–1.0)
Monocytes Relative: 6 %
Neutro Abs: 2.8 10*3/uL (ref 1.7–7.7)
Neutrophils Relative %: 74 %
Platelets: 78 10*3/uL — ABNORMAL LOW (ref 150–400)
RBC: 4.27 MIL/uL (ref 3.87–5.11)
RDW: 13.5 % (ref 11.5–15.5)
WBC: 3.8 10*3/uL — ABNORMAL LOW (ref 4.0–10.5)
nRBC: 0 % (ref 0.0–0.2)

## 2019-11-07 LAB — IRON AND TIBC
Iron: 27 ug/dL — ABNORMAL LOW (ref 41–142)
Saturation Ratios: 5 % — ABNORMAL LOW (ref 21–57)
TIBC: 550 ug/dL — ABNORMAL HIGH (ref 236–444)
UIBC: 523 ug/dL — ABNORMAL HIGH (ref 120–384)

## 2019-11-07 LAB — FERRITIN: Ferritin: 5 ng/mL — ABNORMAL LOW (ref 11–307)

## 2019-11-07 LAB — VITAMIN B12: Vitamin B-12: 347 pg/mL (ref 180–914)

## 2019-11-07 LAB — VITAMIN D 25 HYDROXY (VIT D DEFICIENCY, FRACTURES): Vit D, 25-Hydroxy: 36.37 ng/mL (ref 30–100)

## 2019-11-07 NOTE — Telephone Encounter (Signed)
Per 1/21 sch msg patient is aware of schedule

## 2019-11-08 NOTE — Addendum Note (Signed)
Addended by: Donell Beers on: 11/08/2019 12:52 PM   Modules accepted: Level of Service

## 2019-11-10 LAB — COPPER, SERUM: Copper: 101 ug/dL (ref 80–158)

## 2019-11-12 ENCOUNTER — Inpatient Hospital Stay (HOSPITAL_BASED_OUTPATIENT_CLINIC_OR_DEPARTMENT_OTHER): Payer: Medicaid Other | Admitting: Hematology and Oncology

## 2019-11-12 ENCOUNTER — Other Ambulatory Visit: Payer: Self-pay

## 2019-11-12 VITALS — BP 116/59 | HR 69 | Temp 97.4°F | Resp 18 | Ht 66.0 in | Wt 228.7 lb

## 2019-11-12 DIAGNOSIS — D696 Thrombocytopenia, unspecified: Secondary | ICD-10-CM

## 2019-11-12 DIAGNOSIS — E559 Vitamin D deficiency, unspecified: Secondary | ICD-10-CM

## 2019-11-12 DIAGNOSIS — K909 Intestinal malabsorption, unspecified: Secondary | ICD-10-CM | POA: Diagnosis not present

## 2019-11-12 DIAGNOSIS — E610 Copper deficiency: Secondary | ICD-10-CM | POA: Diagnosis not present

## 2019-11-12 DIAGNOSIS — D61818 Other pancytopenia: Secondary | ICD-10-CM | POA: Diagnosis not present

## 2019-11-12 DIAGNOSIS — D509 Iron deficiency anemia, unspecified: Secondary | ICD-10-CM | POA: Diagnosis not present

## 2019-11-12 DIAGNOSIS — E538 Deficiency of other specified B group vitamins: Secondary | ICD-10-CM

## 2019-11-12 DIAGNOSIS — Z79899 Other long term (current) drug therapy: Secondary | ICD-10-CM | POA: Diagnosis not present

## 2019-11-12 DIAGNOSIS — Z9884 Bariatric surgery status: Secondary | ICD-10-CM | POA: Diagnosis not present

## 2019-11-12 DIAGNOSIS — D469 Myelodysplastic syndrome, unspecified: Secondary | ICD-10-CM | POA: Diagnosis not present

## 2019-11-12 MED ORDER — CYANOCOBALAMIN 1000 MCG/ML IJ SOLN
1000.0000 ug | Freq: Once | INTRAMUSCULAR | Status: AC
  Administered 2019-11-12: 1000 ug via INTRAMUSCULAR

## 2019-11-12 MED ORDER — CYANOCOBALAMIN 1000 MCG/ML IJ SOLN
INTRAMUSCULAR | Status: AC
  Filled 2019-11-12: qty 1

## 2019-11-13 ENCOUNTER — Telehealth: Payer: Self-pay | Admitting: Hematology and Oncology

## 2019-11-13 ENCOUNTER — Encounter: Payer: Self-pay | Admitting: Hematology and Oncology

## 2019-11-13 NOTE — Assessment & Plan Note (Signed)
Vitamin B12 level is adequate She will continue B12 replacement therapy

## 2019-11-13 NOTE — Assessment & Plan Note (Signed)
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

## 2019-11-13 NOTE — Assessment & Plan Note (Signed)
This has resolved She will continue low-dose copper replacement therapy

## 2019-11-13 NOTE — Assessment & Plan Note (Signed)
Vitamin D level is adequate She will continue oral replacement therapy

## 2019-11-13 NOTE — Progress Notes (Signed)
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 The patient is symptomatic from anemia and the iron level is critically low. She tolerated oral iron supplement poorly and desires to achieved higher levels of iron faster for adequate hematopoesis. Some of the side-effects to be expected including risks of infusion reactions, phlebitis, headaches, nausea and fatigue.  The patient is willing to proceed. Patient education material was dispensed.  Goal is to keep ferritin level greater than 50 and resolution of anemia   Dietary copper deficiency This has resolved She will continue low-dose copper replacement therapy  Vitamin D deficiency Vitamin D level is adequate She will continue oral replacement therapy  Vitamin B12 deficiency due to intestinal malabsorption Vitamin B12 level is adequate She will continue B12 replacement therapy   Orders Placed This Encounter  Procedures  . SCHEDULING COMMUNICATION INJECTION    Schedule 1 hour injection appointment    All questions were answered. The patient knows to call the clinic with any problems, questions or  concerns. The total time spent in the appointment was 20 minutes encounter with patients including review of chart and various tests results, discussions about plan of care and coordination of care plan   Heath Lark, MD 11/13/2019 7:51 AM  INTERVAL HISTORY: Please see below for problem oriented charting. She returns to review test results She underwent successful dental extraction without excessive bleeding The patient denies any recent signs or symptoms of bleeding such as spontaneous epistaxis, hematuria or hematochezia. No recent infections  SUMMARY OF ONCOLOGIC HISTORY: Oncology History Overview Note  Cytogenetics showed 2 cell lines.  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  Starting around 2018, she was noted to have progressive mild pancytopenia.  Most recently, around October 2019, she is noted to have significant pancytopenia. She was referred to see hematologist for evaluation.  The patient has history of gastric bypass surgery.  She has significant recent weight loss. Her primary care doctor has ordered multiple mineral panel.  She was noted to have folate, B6, B12, copper as well as iron deficiency.  She has received multiple mineral replacement therapy.  When she was seen here at the hematology clinic, she received high-dose vitamin B12 injection. On August 09, 2018, she underwent ultrasound of her abdomen which showed no evidence of splenomegaly.  The patient has significant smoking history but denies alcohol intake She denies significant recent infection requiring intravenous antibiotic treatment. Infectious work-up was negative. Between October to December 2019, she has received intravenous iron infusion along with  vitamin B12 injection. On October 01, 2018, she underwent bone marrow aspirate and biopsy to evaluate for pancytopenia.  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  She was given vitamin B12 injection, oral copper and oral vitamin D replacement therapy   10/01/2018 Bone Marrow Biopsy   Bone Marrow, Aspirate,Biopsy, and Clot - SLIGHTLY HYPERCELLULAR BONE MARROW FOR AGE WITH DYSPOIETIC CHANGES. - SEE COMMENT. PERIPHERAL BLOOD: - PANCYTOPENIA. Diagnosis Note The bone marrow is slightly hypercellular for age with trilineage hematopoiesis but with relative abundance of erythroid precursors. This is associated with dyspoietic changes variably involving myeloid cell lines but with no increase in blastic cells The differential diagnosis includes secondary changes due to nutritional deficiency, medication, infection, autoimmune disease, toxins, etc. as well as a primary clonal myelodysplastic process. Correlation with cytogenetic and FISH studies is recommended. (BNS:kh 10/02/18)     REVIEW OF SYSTEMS:   Constitutional: Denies fevers, chills or abnormal weight loss Eyes: Denies blurriness of vision Ears, nose, mouth, throat, and face: Denies mucositis or sore throat Respiratory: Denies cough, dyspnea or wheezes Cardiovascular: Denies palpitation, chest discomfort or lower extremity swelling Gastrointestinal:  Denies nausea, heartburn or change in bowel habits Skin: Denies abnormal skin rashes Lymphatics: Denies new lymphadenopathy or easy bruising Neurological:Denies numbness, tingling or new weaknesses Behavioral/Psych: Mood is stable, no new changes  All other systems were reviewed with the patient and are negative.  I have reviewed the past medical history, past surgical history, social history and family history with the patient and they are unchanged from previous note.  ALLERGIES:  is allergic to other.  MEDICATIONS:  Current  Outpatient Medications  Medication Sig Dispense Refill  . HYDROcodone-acetaminophen (NORCO/VICODIN) 5-325 MG tablet Take 1 tablet by mouth every 6 (six) hours as needed.    [name] ibuprofen (ADVIL) 800 MG tablet Take 800 mg by mouth every 8 (eight) hours as needed.    . traMADol (ULTRAM) 50 MG tablet Take 50 mg by mouth every 6 (six) hours as needed.    [name] acetaminophen (TYLENOL) 500 MG tablet Take 1,000 mg by mouth daily as needed for moderate pain.    [name] albuterol (PROAIR HFA) 108 (90 Base) MCG/ACT inhaler Inhale 2 puffs into the lungs every 6 (six) hours as needed for wheezing or shortness of breath. 17 g 3  . albuterol (PROVENTIL) (2.5 MG/3ML) 0.083% nebulizer solution Take 3 mLs (2.5 mg total) by nebulization every 4 (four) hours as needed for wheezing or shortness of breath. 75 mL 5  . cholecalciferol (VITAMIN D3) 25 MCG (1000 UT) tablet Take 5,000 Units by mouth daily.    . colestipol (COLESTID) 1 g tablet Take 2 tablets (2 g total) by mouth 2 (two) times daily. 360 tablet 3  . Copper Gluconate 2 MG CAPS Take 4 mg by mouth daily.    . Cyanocobalamin (VITAMIN B-12 IJ) Inject 1 mg as directed every 30 (thirty) days.    . diclofenac sodium (VOLTAREN) 1 % GEL Apply 2 g topically 4 (four) times daily. 2 g 3  . dicyclomine (BENTYL) 20 MG tablet Take 1 tablet (20 mg total) by mouth as needed for spasms. 120 tablet 1  . esomeprazole (NEXIUM) 40 MG capsule TAKE 1 CAPSULE(40 MG) BY MOUTH TWICE DAILY BEFORE A MEAL 60 capsule 3  . famotidine (PEPCID) 40 MG tablet Take 1 tablet (40 mg total) by mouth 2 (two) times daily. 60 tablet 3  . Fluticasone-Umeclidin-Vilant (TRELEGY ELLIPTA)  100-62.5-25 MCG/INH AEPB Inhale 1 puff into the lungs daily.    . folic acid (FOLVITE) 1 MG tablet Take 1 tablet (1 mg total) by mouth daily. 90 tablet 2  . solifenacin (VESICARE) 5 MG tablet Take 1 tablet (5 mg total) by mouth daily. 30 tablet 5  . traZODone (DESYREL) 150 MG tablet Take 1 tablet (150 mg total) by mouth at bedtime.  For sleep 30 tablet 0  . venlafaxine XR (EFFEXOR-XR) 75 MG 24 hr capsule Take 1 capsule (75 mg total) by mouth daily with breakfast. For depression 30 capsule 0   No current facility-administered medications for this visit.    PHYSICAL EXAMINATION: ECOG PERFORMANCE STATUS: 0 - Asymptomatic  Vitals:   11/12/19 1157  BP: (!) 116/59  Pulse: 69  Resp: 18  Temp: (!) 97.4 F (36.3 C)  SpO2: 97%   Filed Weights   11/12/19 1157  Weight: 228 lb 11.2 oz (103.7 kg)    GENERAL:alert, no distress and comfortable Musculoskeletal:no cyanosis of digits and no clubbing  NEURO: alert & oriented x 3 with fluent speech, no focal motor/sensory deficits  LABORATORY DATA:  I have reviewed the data as listed    Component Value Date/Time   NA 142 09/04/2019 1353   NA 140 07/04/2019 1110   K 3.8 09/04/2019 1353   CL 105 09/04/2019 1353   CO2 26 09/04/2019 1353   GLUCOSE 98 09/04/2019 1353   BUN 8 09/04/2019 1353   BUN 13 07/04/2019 1110   CREATININE 0.67 09/04/2019 1353   CREATININE 0.70 09/21/2018 1000   CREATININE 0.59 08/07/2017 1410   CALCIUM 8.3 (L) 09/04/2019 1353   PROT 6.0 (L) 09/04/2019 1353   PROT 6.2 07/04/2019 1110   ALBUMIN 3.8 09/04/2019 1353   ALBUMIN 4.5 07/04/2019 1110   AST 29 09/04/2019 1353   ALT 39 09/04/2019 1353   ALKPHOS 86 09/04/2019 1353   BILITOT 0.3 09/04/2019 1353   BILITOT 0.4 07/04/2019 1110   GFRNONAA >60 09/04/2019 1353   GFRNONAA >60 09/21/2018 1000   GFRNONAA 110 08/07/2017 1410   GFRAA >60 09/04/2019 1353   GFRAA >60 09/21/2018 1000   GFRAA 127 08/07/2017 1410    No results found for: SPEP, UPEP  Lab Results  Component Value Date   WBC 3.8 (L) 11/07/2019   NEUTROABS 2.8 11/07/2019   HGB 11.5 (L) 11/07/2019   HCT 37.5 11/07/2019   MCV 87.8 11/07/2019   PLT 78 (L) 11/07/2019      Chemistry      Component Value Date/Time   NA 142 09/04/2019 1353   NA 140 07/04/2019 1110   K 3.8 09/04/2019 1353   CL 105 09/04/2019 1353   CO2 26  09/04/2019 1353   BUN 8 09/04/2019 1353   BUN 13 07/04/2019 1110   CREATININE 0.67 09/04/2019 1353   CREATININE 0.70 09/21/2018 1000   CREATININE 0.59 08/07/2017 1410      Component Value Date/Time   CALCIUM 8.3 (L) 09/04/2019 1353   ALKPHOS 86 09/04/2019 1353   AST 29 09/04/2019 1353   ALT 39 09/04/2019 1353   BILITOT 0.3 09/04/2019 1353   BILITOT 0.4 07/04/2019 1110

## 2019-11-13 NOTE — Assessment & Plan Note (Signed)
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 The patient is symptomatic from anemia and the iron level is critically low. She tolerated oral iron supplement poorly and desires to achieved higher levels of iron faster for adequate hematopoesis. Some of the side-effects to be expected including risks of infusion reactions, phlebitis, headaches, nausea and fatigue.  The patient is willing to proceed. Patient education material was dispensed.  Goal is to keep ferritin level greater than 50 and resolution of anemia

## 2019-11-13 NOTE — Telephone Encounter (Signed)
I left a message regarding schedule  

## 2019-11-15 ENCOUNTER — Other Ambulatory Visit: Payer: Self-pay

## 2019-11-15 ENCOUNTER — Inpatient Hospital Stay: Payer: Medicaid Other

## 2019-11-15 VITALS — BP 104/65 | HR 66 | Temp 98.2°F | Resp 17

## 2019-11-15 DIAGNOSIS — D469 Myelodysplastic syndrome, unspecified: Secondary | ICD-10-CM | POA: Diagnosis not present

## 2019-11-15 DIAGNOSIS — E538 Deficiency of other specified B group vitamins: Secondary | ICD-10-CM

## 2019-11-15 DIAGNOSIS — D696 Thrombocytopenia, unspecified: Secondary | ICD-10-CM

## 2019-11-15 DIAGNOSIS — D509 Iron deficiency anemia, unspecified: Secondary | ICD-10-CM

## 2019-11-15 DIAGNOSIS — Z9884 Bariatric surgery status: Secondary | ICD-10-CM

## 2019-11-15 MED ORDER — SODIUM CHLORIDE 0.9 % IV SOLN
Freq: Once | INTRAVENOUS | Status: AC
  Filled 2019-11-15: qty 250

## 2019-11-15 MED ORDER — SODIUM CHLORIDE 0.9 % IV SOLN
510.0000 mg | Freq: Once | INTRAVENOUS | Status: AC
  Administered 2019-11-15: 510 mg via INTRAVENOUS
  Filled 2019-11-15: qty 510

## 2019-11-15 NOTE — Patient Instructions (Signed)

## 2019-11-22 ENCOUNTER — Other Ambulatory Visit: Payer: Self-pay

## 2019-11-22 ENCOUNTER — Inpatient Hospital Stay: Payer: Medicaid Other | Attending: Hematology and Oncology

## 2019-11-22 VITALS — BP 114/72 | HR 54 | Temp 98.5°F | Resp 16

## 2019-11-22 DIAGNOSIS — D509 Iron deficiency anemia, unspecified: Secondary | ICD-10-CM

## 2019-11-22 DIAGNOSIS — Z79899 Other long term (current) drug therapy: Secondary | ICD-10-CM | POA: Diagnosis not present

## 2019-11-22 DIAGNOSIS — D469 Myelodysplastic syndrome, unspecified: Secondary | ICD-10-CM | POA: Diagnosis present

## 2019-11-22 DIAGNOSIS — E538 Deficiency of other specified B group vitamins: Secondary | ICD-10-CM

## 2019-11-22 DIAGNOSIS — K909 Intestinal malabsorption, unspecified: Secondary | ICD-10-CM

## 2019-11-22 DIAGNOSIS — D696 Thrombocytopenia, unspecified: Secondary | ICD-10-CM

## 2019-11-22 DIAGNOSIS — Z9884 Bariatric surgery status: Secondary | ICD-10-CM

## 2019-11-22 MED ORDER — CYANOCOBALAMIN 1000 MCG/ML IJ SOLN
INTRAMUSCULAR | Status: AC
  Filled 2019-11-22: qty 1

## 2019-11-22 MED ORDER — SODIUM CHLORIDE 0.9 % IV SOLN
510.0000 mg | Freq: Once | INTRAVENOUS | Status: AC
  Administered 2019-11-22: 510 mg via INTRAVENOUS
  Filled 2019-11-22: qty 510

## 2019-11-22 MED ORDER — SODIUM CHLORIDE 0.9 % IV SOLN
Freq: Once | INTRAVENOUS | Status: AC
  Filled 2019-11-22: qty 250

## 2019-11-22 MED ORDER — CYANOCOBALAMIN 1000 MCG/ML IJ SOLN
1000.0000 ug | Freq: Once | INTRAMUSCULAR | Status: AC
  Administered 2019-11-22: 09:00:00 1000 ug via INTRAMUSCULAR

## 2019-11-22 NOTE — Progress Notes (Signed)
Pt declined to stay for 30 minute post observation.  DC'd in stable condition.

## 2019-11-22 NOTE — Patient Instructions (Signed)
Ferumoxytol injection What is this medicine? FERUMOXYTOL is an iron complex. Iron is used to make healthy red blood cells, which carry oxygen and nutrients throughout the body. This medicine is used to treat iron deficiency anemia. This medicine may be used for other purposes; ask your health care provider or pharmacist if you have questions. COMMON BRAND NAME(S): Feraheme What should I tell my health care provider before I take this medicine? They need to know if you have any of these conditions:  anemia not caused by low iron levels  high levels of iron in the blood  magnetic resonance imaging (MRI) test scheduled  an unusual or allergic reaction to iron, other medicines, foods, dyes, or preservatives  pregnant or trying to get pregnant  breast-feeding How should I use this medicine? This medicine is for injection into a vein. It is given by a health care professional in a hospital or clinic setting. Talk to your pediatrician regarding the use of this medicine in children. Special care may be needed. Overdosage: If you think you have taken too much of this medicine contact a poison control center or emergency room at once. NOTE: This medicine is only for you. Do not share this medicine with others. What if I miss a dose? It is important not to miss your dose. Call your doctor or health care professional if you are unable to keep an appointment. What may interact with this medicine? This medicine may interact with the following medications:  other iron products This list may not describe all possible interactions. Give your health care provider a list of all the medicines, herbs, non-prescription drugs, or dietary supplements you use. Also tell them if you smoke, drink alcohol, or use illegal drugs. Some items may interact with your medicine. What should I watch for while using this medicine? Visit your doctor or healthcare professional regularly. Tell your doctor or healthcare  professional if your symptoms do not start to get better or if they get worse. You may need blood work done while you are taking this medicine. You may need to follow a special diet. Talk to your doctor. Foods that contain iron include: whole grains/cereals, dried fruits, beans, or peas, leafy green vegetables, and organ meats (liver, kidney). What side effects may I notice from receiving this medicine? Side effects that you should report to your doctor or health care professional as soon as possible:  allergic reactions like skin rash, itching or hives, swelling of the face, lips, or tongue  breathing problems  changes in blood pressure  feeling faint or lightheaded, falls  fever or chills  flushing, sweating, or hot feelings  swelling of the ankles or feet Side effects that usually do not require medical attention (report to your doctor or health care professional if they continue or are bothersome):  diarrhea  headache  nausea, vomiting  stomach pain This list may not describe all possible side effects. Call your doctor for medical advice about side effects. You may report side effects to FDA at 1-800-FDA-1088. Where should I keep my medicine? This drug is given in a hospital or clinic and will not be stored at home. NOTE: This sheet is a summary. It may not cover all possible information. If you have questions about this medicine, talk to your doctor, pharmacist, or health care provider.  2020 Elsevier/Gold Standard (2016-11-21 20:21:10)  Cyanocobalamin, Vitamin B12 injection What is this medicine? CYANOCOBALAMIN (sye an oh koe BAL a min) is a man made form of vitamin   vitamin B12. Vitamin B12 is used in the growth of healthy blood cells, nerve cells, and proteins in the body. It also helps with the metabolism of fats and carbohydrates. This medicine is used to treat people who can not absorb vitamin B12. This medicine may be used for other purposes; ask your health care provider or  pharmacist if you have questions. COMMON BRAND NAME(S): B-12 Compliance Kit, B-12 Injection Kit, Cyomin, LA-12, Nutri-Twelve, Physicians EZ Use B-12, Primabalt What should I tell my health care provider before I take this medicine? They need to know if you have any of these conditions:  kidney disease  Leber's disease  megaloblastic anemia  an unusual or allergic reaction to cyanocobalamin, cobalt, other medicines, foods, dyes, or preservatives  pregnant or trying to get pregnant  breast-feeding How should I use this medicine? This medicine is injected into a muscle or deeply under the skin. It is usually given by a health care professional in a clinic or doctor's office. However, your doctor may teach you how to inject yourself. Follow all instructions. Talk to your pediatrician regarding the use of this medicine in children. Special care may be needed. Overdosage: If you think you have taken too much of this medicine contact a poison control center or emergency room at once. NOTE: This medicine is only for you. Do not share this medicine with others. What if I miss a dose? If you are given your dose at a clinic or doctor's office, call to reschedule your appointment. If you give your own injections and you miss a dose, take it as soon as you can. If it is almost time for your next dose, take only that dose. Do not take double or extra doses. What may interact with this medicine?  colchicine  heavy alcohol intake This list may not describe all possible interactions. Give your health care provider a list of all the medicines, herbs, non-prescription drugs, or dietary supplements you use. Also tell them if you smoke, drink alcohol, or use illegal drugs. Some items may interact with your medicine. What should I watch for while using this medicine? Visit your doctor or health care professional regularly. You may need blood work done while you are taking this medicine. You may need to  follow a special diet. Talk to your doctor. Limit your alcohol intake and avoid smoking to get the best benefit. What side effects may I notice from receiving this medicine? Side effects that you should report to your doctor or health care professional as soon as possible:  allergic reactions like skin rash, itching or hives, swelling of the face, lips, or tongue  blue tint to skin  chest tightness, pain  difficulty breathing, wheezing  dizziness  red, swollen painful area on the leg Side effects that usually do not require medical attention (report to your doctor or health care professional if they continue or are bothersome):  diarrhea  headache This list may not describe all possible side effects. Call your doctor for medical advice about side effects. You may report side effects to FDA at 1-800-FDA-1088. Where should I keep my medicine? Keep out of the reach of children. Store at room temperature between 15 and 30 degrees C (59 and 85 degrees F). Protect from light. Throw away any unused medicine after the expiration date. NOTE: This sheet is a summary. It may not cover all possible information. If you have questions about this medicine, talk to your doctor, pharmacist, or health care provider.  2020  Elsevier/Gold Standard (2008-01-14 22:10:20)

## 2019-11-25 ENCOUNTER — Other Ambulatory Visit: Payer: Medicaid Other

## 2019-12-02 ENCOUNTER — Other Ambulatory Visit: Payer: Self-pay | Admitting: Emergency Medicine

## 2019-12-02 ENCOUNTER — Ambulatory Visit: Payer: Medicaid Other | Admitting: Hematology and Oncology

## 2019-12-02 MED ORDER — FAMOTIDINE 40 MG PO TABS: 40.0000 mg | ORAL_TABLET | Freq: Two times a day (BID) | ORAL | 3 refills | Status: DC

## 2019-12-02 NOTE — Telephone Encounter (Signed)
Rx sent to pharmacy as per fax request

## 2019-12-24 NOTE — Progress Notes (Signed)
Referring Provider: Dorena Dew, FNP Primary Care Physician:  Dorena Dew, FNP   Chief complaint:  Diarrhea   IMPRESSION:  Acute on chronic diarrhea with recent hospitalization for associated hypokalemia    - diarrhea initially developed as a teenager with 4 BM at baseline    - C diff and culture negative    - improved with loperamide, lomotil and empiric metronidazole    - Giardia antigen negative, fecal calprotectin 32    - normal duodenal and random colon biopsies 09/26/19 GERD with small hiatal hernia Recent diagnosis of B12, folate, and iron deficiencies presenting with pancytopenia Pancytopenia with persistent thrombocytopenia Gastric bypass 2003 Recent elevated transaminases while hospitalized, now normal    - 08/09/18 abdominal ultrasound revealed no source Prior cholecystectomy BMI 31 Tobacco habituation No prior colon cancer screening  Evaluation for cause of diarrhea has excluded celiac, IBD and microscopic colitis. Diarrhea improved on colestipol 2 g BID but then worsened. Continue dicyclomine PRN for post-prandial symptoms. Add daily Benefiber, probiotic such as Align, and colestipol 3gQAM and 2gQPM. Obtain fecal elastase to screen for PEI.   She will be due another colonoscopy for colon cancer screening in 2030.  Labs to follow-up on recent hypokalemia.   Reflux controlled on Nexium 40 mg BID and famotidine 20 mg BID, although she prefers to dose them on a PRN basis.  PLAN: Continue Nexium 40 mg BID and famotodine 20 mg BID Add daily Benefiber Add a daily probiotic such as Align Continue dicyclomine 20 mg QID  Continue colestipol 3g QAM and 2g QPM BMP to follow-up on hypokalemia Smoking cessation Fecal elastase  Colonoscopy 2030 Follow-up in 4-6 weeks Consider trial of Xifaxan if symptoms persist  HPI: Barbara Stanley is a 49 y.o. female under evaluation of diarrhea.  She returns in scheduled follow-up.  The interval history is obtained  through the patient and review of her electronic health record. Gastric bypass performed 16 years ago [place]. Not on any long term supplements until she was hospitalized last year for diarrhea-induced hypokalemia and pancytopenia. Diarrhea has improved since starting vitamin [id number] and folic acid for presumptive severe pancytopenia secondary to undernutrition and vitamin deficiencies with b12, folate, and iron. She has a history of chronic obstructive pulmonary disease; major depression; gastric bypass surgery 16 years ago; unilateral right hearing deficit; chronic anemia; gastroesophageal reflux disease; active tobacco dependence; degenerative joint disease involving the left shoulder, lower back, and knees bilaterally.  Initially seen for loose BM occuring 3-4 times in the morning. At it's worst, she was having 20 bowel movements daily. Most days she will not have any further diarrhea. Baseline bowel habits following her bypass are diarrhea - 4 slightly formed BM at baseline. Was also having diffuse and upper abdominal abdominal pain that occurs immediately after eating or while eating despite Nexium QAM and famotidine QPM. Associated nausea. Occassional postprandial bowel movement that improves the pain.   An upper GI series 07/12/2019 showed moderate gastroesophageal reflux, small hiatal hernia and inability to completely assess for esophageal motility given the patient's nausea during the exam.  However there was no significant delay in contrast passage through the esophagus.  Upper endoscopy 09/26/19 revealed a normal esophagus, small hiatal hernia, evidence of gastric bypass with a small pouch that appeared normal and a normal appearing anastomosis.  Duodenal biopsies were normal.  A colonoscopy performed at the tame time was normal.  Random colon biopsies from the right and left colon for microscopic colitis were normal.  On her  follow-up visit 10/28/19 she reported improved symptoms on colestipol 2  mg BID with two soft, formed stools daily. Abdominal pain had resolved. Sh ewas very pleased with this daily habit. Worried about the Producer, television/film/video of colestipol and what she will do it she runs out. Using her other GI meds PRN.   She called the office in mid-February reporting 3-5 BM daily. One day she felt the colestipol was no longer working. Will have a day of normal stools followed by a day of profound diarrhea.  Has a severe, upper abdominal cramping that precedes BM and resolves with defecation.  Colestipol increased to 3g QAM and 2 g QPM. She returns today in follow-up.  Despite the increase, she still has frequent BM although they are more formed.  Overall her abdominal symptoms have improved. Appetite is good. She is gaining weight. Rarely using Bentyl.   She continues to be followed closely by Alvy Bimler. B12 and copper levels have been restored on replacement therapy. Iron infusion last month after poorly tolerating oral iron supplements. Dr. Calton Dach note from office visit 11/13/19 documents complete normalization of hemoglobin and WBC. Thrombocytopenia persists although it's improved.   Recent labs: The results of the C. difficile quick screen with PCR reflex was negative from October 31.  Qualitative fecal lactoferrin was positive.  A stool culture for E. coli; Shigella toxin; Salmonella and Shigella were not identified. Campylobacter culture was negative. Empirically she was given a brief course of metronidazole.   Labs 06/07/19: WBC 5.7, hgb 13.6, platelets 49 Labs 07/04/19: normal CMP Labs 07/19/19: normal copper, Vit D, B12; normal CBC except for platelets of 38 Labs 08/25/2019: normal comprehensive metabolic panel except for a calcium of 8.3 and a total protein of 6.  White count 5.3, hemoglobin 12.3, MCV 90, RDW 14.1, platelets 46. Labs 11/07/19: WBC 3.8, hgb 11.5, MCV 87, RDW 13, platelets 78, iron 27, ferritin 5, percent saturation 5.   Prior endoscopy:  EGD for reflux several years  ago "somewhere in [place]."  EGD 09/26/2019: normal esophagus, small hiatal hernia, evidence of gastric bypass with a small pouch normal-appearing anastomosis, and normal small bowel.  Duodenal biopsies were normal. Colonoscopy 09/26/2019: normal.  Random colon biopsies for microscopic colitis were normal.   Abdominal imaging: Abdominal ultrasound for abnormal liver enzymes 08/09/18:  common bile duct of 6.6 mm status post cholecystectomy.  Spleen is upper limit of normal at 12.6 cm. Transaminases at that time were elevated. They have since been repeatedly normal on several rechecks.  UGI series 07/12/19: Moderate gastroesophageal reflux. Small hiatal hernia. Esophageal motility could not be completely assessed as the patient became nauseated during the exam and could not perform this portion. However throughout the exam no significant delay in contrast passage through the esophagus was observed.  Past Medical History:  Diagnosis Date  . Abdominal pain   . Anemia   . Anxiety   . Bronchitis   . COPD (chronic obstructive pulmonary disease) ([place])   . Depression    bipolar  . Diarrhea   . Emphysema   . GERD (gastroesophageal reflux disease)   . History of blood transfusion   . Splenomegaly    r/t low platelets  . Thrombocytopenia ([place])   . Tobacco abuse     Past Surgical History:  Procedure Laterality Date  . ABDOMINAL HYSTERECTOMY    . c section x 3     . CHOLECYSTECTOMY    . GASTRIC BYPASS    . UPPER GASTROINTESTINAL ENDOSCOPY    .  WISDOM TOOTH EXTRACTION      Current Outpatient Medications  Medication Sig Dispense Refill  . acetaminophen (TYLENOL) 500 MG tablet Take 1,000 mg by mouth daily as needed for moderate pain.    [name] albuterol (PROAIR HFA) 108 (90 Base) MCG/ACT inhaler Inhale 2 puffs into the lungs every 6 (six) hours as needed for wheezing or shortness of breath. 17 g 3  . albuterol (PROVENTIL) (2.5 MG/3ML) 0.083% nebulizer solution Take 3 mLs (2.5 mg total) by nebulization  every 4 (four) hours as needed for wheezing or shortness of breath. 75 mL 5  . cholecalciferol (VITAMIN D3) 25 MCG (1000 UT) tablet Take 5,000 Units by mouth daily.    . colestipol (COLESTID) 1 g tablet Take 2 tablets (2 g total) by mouth 2 (two) times daily. 360 tablet 3  . Copper Gluconate 2 MG CAPS Take 4 mg by mouth daily.    . Cyanocobalamin (VITAMIN B-12 IJ) Inject 1 mg as directed every 30 (thirty) days.    . diclofenac sodium (VOLTAREN) 1 % GEL Apply 2 g topically 4 (four) times daily. 2 g 3  . dicyclomine (BENTYL) 20 MG tablet Take 1 tablet (20 mg total) by mouth as needed for spasms. 120 tablet 1  . esomeprazole (NEXIUM) 40 MG capsule TAKE 1 CAPSULE(40 MG) BY MOUTH TWICE DAILY BEFORE A MEAL 60 capsule 3  . famotidine (PEPCID) 40 MG tablet Take 1 tablet (40 mg total) by mouth 2 (two) times daily. 60 tablet 3  . Fluticasone-Umeclidin-Vilant (TRELEGY ELLIPTA) 100-62.5-25 MCG/INH AEPB Inhale 1 puff into the lungs daily.    . folic acid (FOLVITE) 1 MG tablet Take 1 tablet (1 mg total) by mouth daily. 90 tablet 2  . HYDROcodone-acetaminophen (NORCO/VICODIN) 5-325 MG tablet Take 1 tablet by mouth every 6 (six) hours as needed.    [name] ibuprofen (ADVIL) 800 MG tablet Take 800 mg by mouth every 8 (eight) hours as needed.    . solifenacin (VESICARE) 5 MG tablet Take 1 tablet (5 mg total) by mouth daily. 30 tablet 5  . traMADol (ULTRAM) 50 MG tablet Take 50 mg by mouth every 6 (six) hours as needed.    . traZODone (DESYREL) 150 MG tablet Take 1 tablet (150 mg total) by mouth at bedtime. For sleep 30 tablet 0  . venlafaxine XR (EFFEXOR-XR) 75 MG 24 hr capsule Take 1 capsule (75 mg total) by mouth daily with breakfast. For depression 30 capsule 0   No current facility-administered medications for this visit.    Allergies as of 12/25/2019 - Review Complete 11/15/2019  Allergen Reaction Noted  . Other  08/08/2018    Family History  Problem Relation Age of Onset  . Diabetes Mother   . Diabetes  Other   . Hypertension Other   . CAD Other   . Heart disease Father   . Colon cancer Neg Hx   . Rectal cancer Neg Hx   . Stomach cancer Neg Hx   . Esophageal cancer Neg Hx     Social History   Socioeconomic History  . Marital status: Married    Spouse name: Not on file  . Number of children: 3  . Years of education: Not on file  . Highest education level: Not on file  Occupational History  . Occupation: disabled  Tobacco Use  . Smoking status: Current Every Day Smoker    Packs/day: 1.00    Years: 30.00    Pack years: 30.00    Types: Cigarettes  .  Smokeless tobacco: Never Used  . Tobacco comment: tobacco info given 10/28/2019  Substance and Sexual Activity  . Alcohol use: Yes    Comment: occasional wine  . Drug use: No  . Sexual activity: Yes    Birth control/protection: None    Comment: Hysterectomy  Other Topics Concern  . Not on file  Social History Narrative  . Not on file   Social Determinants of Health   Financial Resource Strain:   . Difficulty of Paying Living Expenses: Not on file  Food Insecurity:   . Worried About Charity fundraiser in the Last Year: Not on file  . Ran Out of Food in the Last Year: Not on file  Transportation Needs:   . Lack of Transportation (Medical): Not on file  . Lack of Transportation (Non-Medical): Not on file  Physical Activity:   . Days of Exercise per Week: Not on file  . Minutes of Exercise per Session: Not on file  Stress:   . Feeling of Stress : Not on file  Social Connections:   . Frequency of Communication with Friends and Family: Not on file  . Frequency of Social Gatherings with Friends and Family: Not on file  . Attends Religious Services: Not on file  . Active Member of Clubs or Organizations: Not on file  . Attends Archivist Meetings: Not on file  . Marital Status: Not on file  Intimate Partner Violence:   . Fear of Current or Ex-Partner: Not on file  . Emotionally Abused: Not on file  .  Physically Abused: Not on file  . Sexually Abused: Not on file     Physical Exam: Vital signs were reviewed. General:   Alert, well-nourished, pleasant and cooperative in NAD Abdomen:  Soft, nontender, central obesity, normal bowel sounds. No rebound or guarding. No hepatosplenomegaly. Well healed midline surgical scar.  Neurologic:  Alert and  oriented x4;  grossly nonfocal Skin:  No rash or bruise. Palmar erythema present. No spider angioma.  Psych:  Alert and cooperative. Normal mood and affect.   Noor Witte L. Tarri Glenn Md, MPH [facility] 12/24/2019, 12:03 PM

## 2019-12-25 ENCOUNTER — Encounter: Payer: Self-pay | Admitting: Gastroenterology

## 2019-12-25 ENCOUNTER — Other Ambulatory Visit (INDEPENDENT_AMBULATORY_CARE_PROVIDER_SITE_OTHER): Payer: Medicaid Other

## 2019-12-25 ENCOUNTER — Ambulatory Visit (INDEPENDENT_AMBULATORY_CARE_PROVIDER_SITE_OTHER): Payer: Medicaid Other | Admitting: Gastroenterology

## 2019-12-25 VITALS — BP 124/60 | HR 86 | Temp 97.0°F | Ht 66.0 in | Wt 246.8 lb

## 2019-12-25 DIAGNOSIS — E876 Hypokalemia: Secondary | ICD-10-CM

## 2019-12-25 DIAGNOSIS — K219 Gastro-esophageal reflux disease without esophagitis: Secondary | ICD-10-CM | POA: Diagnosis not present

## 2019-12-25 DIAGNOSIS — R197 Diarrhea, unspecified: Secondary | ICD-10-CM

## 2019-12-25 LAB — BASIC METABOLIC PANEL
BUN: 12 mg/dL (ref 6–23)
CO2: 32 mEq/L (ref 19–32)
Calcium: 8.6 mg/dL (ref 8.4–10.5)
Chloride: 104 mEq/L (ref 96–112)
Creatinine, Ser: 0.67 mg/dL (ref 0.40–1.20)
GFR: 93.62 mL/min (ref >60.00)
Glucose, Bld: 54 mg/dL — ABNORMAL LOW (ref 70–99)
Potassium: 4 mEq/L (ref 3.5–5.1)
Sodium: 140 mEq/L (ref 135–145)

## 2019-12-25 NOTE — Patient Instructions (Addendum)
Let's try a few changes to see if we can help improve your diarrhea: - Continue to take colestipol 3g each morning and 2g each evening - Add daily Benefiber to add stool bulk and irregularity - Add a daily probiotic such as Align to support your gut bacteria - Take the dicylclomine every morning and before meals.  - Keep a food diary to try to identify food triggers that may be causing your symptoms. In particular, what foods may cause diarrhea or make it worse.   I would like for you to go to the lab so that we can check your potassium levels and do an additional stool test.  I have not change your medicines for reflux. Please continue to take Nexium 40 mg twice daily and famotidine 20 mg twice daily.   Smoking increases your risk for GI problems and multiple gastrointestinal cancers. I recommend that you quit smoking.  Let's plan to see each other in 1 month. Call with any questions prior to that time.   I value your feedback and thank you for entrusting [place] with your care. If you get a [place] patient survey, I would appreciate you taking the time to let us know about your experience today. Thank you!   Due to recent changes in healthcare laws, you may see the results of your imaging and laboratory studies on MyChart before your provider has had a chance to review them.  We understand that in some cases there may be results that are confusing or concerning to you. Not all laboratory results come back in the same time frame and the provider may be waiting for multiple results in order to interpret others.  Please give [place] 48 hours in order for your provider to thoroughly review all the results before contacting the office for clarification of your results.

## 2019-12-30 ENCOUNTER — Telehealth: Payer: Self-pay | Admitting: Family Medicine

## 2019-12-30 NOTE — Telephone Encounter (Signed)
Pt was called and reminded of there appointment 

## 2019-12-31 ENCOUNTER — Ambulatory Visit (INDEPENDENT_AMBULATORY_CARE_PROVIDER_SITE_OTHER): Payer: Medicaid Other | Admitting: Family Medicine

## 2019-12-31 ENCOUNTER — Other Ambulatory Visit: Payer: Medicaid Other

## 2019-12-31 ENCOUNTER — Other Ambulatory Visit: Payer: Self-pay

## 2019-12-31 ENCOUNTER — Encounter: Payer: Self-pay | Admitting: Family Medicine

## 2019-12-31 VITALS — BP 109/60 | HR 72 | Temp 97.8°F | Resp 16 | Ht 66.0 in | Wt 243.0 lb

## 2019-12-31 DIAGNOSIS — Z13220 Encounter for screening for lipoid disorders: Secondary | ICD-10-CM | POA: Diagnosis not present

## 2019-12-31 DIAGNOSIS — M25561 Pain in right knee: Secondary | ICD-10-CM

## 2019-12-31 DIAGNOSIS — M79672 Pain in left foot: Secondary | ICD-10-CM | POA: Diagnosis not present

## 2019-12-31 DIAGNOSIS — K219 Gastro-esophageal reflux disease without esophagitis: Secondary | ICD-10-CM

## 2019-12-31 DIAGNOSIS — M25562 Pain in left knee: Secondary | ICD-10-CM

## 2019-12-31 DIAGNOSIS — R197 Diarrhea, unspecified: Secondary | ICD-10-CM

## 2019-12-31 DIAGNOSIS — E876 Hypokalemia: Secondary | ICD-10-CM

## 2019-12-31 DIAGNOSIS — E162 Hypoglycemia, unspecified: Secondary | ICD-10-CM

## 2019-12-31 DIAGNOSIS — F17200 Nicotine dependence, unspecified, uncomplicated: Secondary | ICD-10-CM

## 2019-12-31 DIAGNOSIS — G8929 Other chronic pain: Secondary | ICD-10-CM

## 2019-12-31 LAB — GLUCOSE, POCT (MANUAL RESULT ENTRY)
POC Glucose: 70 mg/dl (ref 70–99)
POC Glucose: 102 mg/dl — AB (ref 70–99)

## 2019-12-31 LAB — POCT GLYCOSYLATED HEMOGLOBIN (HGB A1C): Hemoglobin A1C: 5.2 % (ref 4.0–5.6)

## 2019-12-31 MED ORDER — ACCU-CHEK SOFTCLIX LANCETS MISC: 12 refills | Status: DC

## 2019-12-31 MED ORDER — ACCU-CHEK AVIVA PLUS VI STRP: ORAL_STRIP | 12 refills | Status: DC

## 2019-12-31 MED ORDER — ACCU-CHEK AVIVA PLUS W/DEVICE KIT: 1.0000 | PACK | Freq: Two times a day (BID) | 0 refills | Status: DC | PRN

## 2019-12-31 NOTE — Progress Notes (Signed)
Patient [facility]    Subjective:  Patient ID: Barbara Stanley, female    DOB: Mar 10, 1971  Age: 49 y.o. MRN: [id number]  CC:  Chief Complaint  Patient presents with  . Hypoglycemia  . Foot Pain    left foot pain on heel and side of foot   . COPD  . Knee Pain    sharp pain in both knees   . Abdominal Cramping   Barbara Stanley, a 49 year old female with a medical history significant for COPD, myeloplastic disorder, hypoglycemia, B12 deficiency, and vitamin d deficiency presents for a follow up of chronic conditions. Patient also complaining of bilateral foot pain, but primarily to left side. She denies any recent injuries. She has not identified any provocative factors concerning foot pain.  [name] is a chronic everyday tobacco user. She has attempted to quit in the past without success. She has a history of COPD without frequent exacerbations.   Hypoglycemia This is a chronic problem. The problem has been gradually improving. Associated symptoms include joint swelling and weakness. Pertinent negatives include no chest pain, chills, congestion, coughing, sore throat, swollen glands or urinary symptoms.  Foot Pain This is a recurrent problem. The current episode started more than 1 month ago. The problem occurs intermittently. The problem has been unchanged. Associated symptoms include joint swelling and weakness. Pertinent negatives include no chest pain, chills, congestion, coughing, sore throat, swollen glands or urinary symptoms. The symptoms are aggravated by standing and walking. She has tried acetaminophen for the symptoms. The treatment provided no relief.  COPD There is no chest tightness, cough, difficulty breathing, hemoptysis, hoarse voice, sputum production or wheezing. This is a chronic problem. Pertinent negatives include no appetite change, chest pain or sore throat. Risk factors for lung disease include smoking/tobacco exposure.  Her past medical history is significant for COPD.    Past Medical History:  Diagnosis Date  . Abdominal pain   . Anemia   . Anxiety   . Bronchitis   . COPD (chronic obstructive pulmonary disease) ([place])   . Depression    bipolar  . Diarrhea   . Emphysema   . GERD (gastroesophageal reflux disease)   . History of blood transfusion   . Splenomegaly    r/t low platelets  . Thrombocytopenia ([place])   . Tobacco abuse     Past Surgical History:  Procedure Laterality Date  . ABDOMINAL HYSTERECTOMY    . c section x 3     . CHOLECYSTECTOMY    . GASTRIC BYPASS    . UPPER GASTROINTESTINAL ENDOSCOPY    . WISDOM TOOTH EXTRACTION      Family History  Problem Relation Age of Onset  . Diabetes Mother   . Diabetes Other   . Hypertension Other   . CAD Other   . Heart disease Father   . Colon cancer Neg Hx   . Rectal cancer Neg Hx   . Stomach cancer Neg Hx   . Esophageal cancer Neg Hx     Social History   Socioeconomic History  . Marital status: Married    Spouse name: Not on file  . Number of children: 3  . Years of education: Not on file  . Highest education level: Not on file  Occupational History  . Occupation: disabled  Tobacco Use  . Smoking status: Current Every Day Smoker    Packs/day: 1.00    Years: 30.00    Pack years: 30.00  Types: Cigarettes  . Smokeless tobacco: Never Used  . Tobacco comment: tobacco info given 10/28/2019  Substance and Sexual Activity  . Alcohol use: Yes    Comment: occasional wine  . Drug use: No  . Sexual activity: Yes    Birth control/protection: None    Comment: Hysterectomy  Other Topics Concern  . Not on file  Social History Narrative  . Not on file   Social Determinants of Health   Financial Resource Strain:   . Difficulty of Paying Living Expenses:   Food Insecurity:   . Worried About Charity fundraiser in the Last Year:   . Arboriculturist in the Last Year:   Transportation Needs:   . Film/video editor  (Medical):   [name] Lack of Transportation (Non-Medical):   Physical Activity:   . Days of Exercise per Week:   . Minutes of Exercise per Session:   Stress:   . Feeling of Stress :   Social Connections:   . Frequency of Communication with Friends and Family:   . Frequency of Social Gatherings with Friends and Family:   . Attends Religious Services:   . Active Member of Clubs or Organizations:   . Attends Archivist Meetings:   [name] Marital Status:   Intimate Partner Violence:   . Fear of Current or Ex-Partner:   . Emotionally Abused:   [name] Physically Abused:   . Sexually Abused:     Outpatient Medications Prior to Visit  Medication Sig Dispense Refill  . acetaminophen (TYLENOL) 500 MG tablet Take 1,000 mg by mouth daily as needed for moderate pain.    [name] albuterol (PROAIR HFA) 108 (90 Base) MCG/ACT inhaler Inhale 2 puffs into the lungs every 6 (six) hours as needed for wheezing or shortness of breath. 17 g 3  . albuterol (PROVENTIL) (2.5 MG/3ML) 0.083% nebulizer solution Take 3 mLs (2.5 mg total) by nebulization every 4 (four) hours as needed for wheezing or shortness of breath. 75 mL 5  . cholecalciferol (VITAMIN D3) 25 MCG (1000 UT) tablet Take 5,000 Units by mouth daily.    . colestipol (COLESTID) 1 g tablet Take 2 tablets (2 g total) by mouth 2 (two) times daily. (Patient taking differently: Take 1 g by mouth as directed. 3 tablets in morning and 2 in the afternoon) 360 tablet 3  . Copper Gluconate 2 MG CAPS Take 4 mg by mouth daily.    . Cyanocobalamin (VITAMIN B-12 IJ) Inject 1 mg as directed as directed. Every 3 months    . diclofenac sodium (VOLTAREN) 1 % GEL Apply 2 g topically 4 (four) times daily. 2 g 3  . dicyclomine (BENTYL) 20 MG tablet Take 1 tablet (20 mg total) by mouth as needed for spasms. 120 tablet 1  . esomeprazole (NEXIUM) 40 MG capsule TAKE 1 CAPSULE(40 MG) BY MOUTH TWICE DAILY BEFORE A MEAL 60 capsule 3  . famotidine (PEPCID) 40 MG tablet Take 1 tablet (40 mg  total) by mouth 2 (two) times daily. 60 tablet 3  . Fluticasone-Umeclidin-Vilant (TRELEGY ELLIPTA) 100-62.5-25 MCG/INH AEPB Inhale 1 puff into the lungs daily.    . folic acid (FOLVITE) 1 MG tablet Take 1 tablet (1 mg total) by mouth daily. 90 tablet 2  . solifenacin (VESICARE) 5 MG tablet Take 1 tablet (5 mg total) by mouth daily. 30 tablet 5  . traZODone (DESYREL) 150 MG tablet Take 1 tablet (150 mg total) by mouth at bedtime. For sleep (Patient taking differently:  Take 300 mg by mouth at bedtime. For sleep) 30 tablet 0  . venlafaxine XR (EFFEXOR-XR) 75 MG 24 hr capsule Take 1 capsule (75 mg total) by mouth daily with breakfast. For depression 30 capsule 0   No facility-administered medications prior to visit.    Allergies  Allergen Reactions  . Other     "Banana Bag" given to patient at physician's office, severe back pain, SOB    ROS Review of Systems  Constitutional: Negative for activity change, appetite change and chills.  HENT: Negative for congestion, hoarse voice and sore throat.   Respiratory: Negative.  Negative for cough, hemoptysis, sputum production and wheezing.   Cardiovascular: Negative for chest pain.  Gastrointestinal: Negative.        Occasional abdominal cramping  Endocrine: Negative for polydipsia, polyphagia and polyuria.  Genitourinary: Negative.   Musculoskeletal: Positive for gait problem and joint swelling.  Skin: Negative.   Allergic/Immunologic: Negative.   Neurological: Positive for weakness.  Psychiatric/Behavioral: Negative.       Objective:    Physical Exam  BP 109/60 (BP Location: Right Arm, Patient Position: Sitting, Cuff Size: Large)   Pulse 72   Temp 97.8 F (36.6 C) (Oral)   Resp 16   Ht '5\' 6"'  (1.676 m)   Wt 243 lb (110.2 kg)   LMP  (LMP Unknown)   SpO2 96%   BMI 39.22 kg/m  Wt Readings from Last 3 Encounters:  12/31/19 243 lb (110.2 kg)  12/25/19 246 lb 12.8 oz (111.9 kg)  11/12/19 228 lb 11.2 oz (103.7 kg)     Lab  Results  Component Value Date   TSH 0.74 09/05/2018   Lab Results  Component Value Date   WBC 3.8 (L) 11/07/2019   HGB 11.5 (L) 11/07/2019   HCT 37.5 11/07/2019   MCV 87.8 11/07/2019   PLT 78 (L) 11/07/2019   Lab Results  Component Value Date   NA 140 12/25/2019   K 4.0 12/25/2019   CO2 32 12/25/2019   GLUCOSE 54 (L) 12/25/2019   BUN 12 12/25/2019   CREATININE 0.67 12/25/2019   BILITOT 0.3 09/04/2019   ALKPHOS 86 09/04/2019   AST 29 09/04/2019   ALT 39 09/04/2019   PROT 6.0 (L) 09/04/2019   ALBUMIN 3.8 09/04/2019   CALCIUM 8.6 12/25/2019   ANIONGAP 11 09/04/2019   GFR 93.62 12/25/2019   Lab Results  Component Value Date   CHOL 156 11/06/2014   Lab Results  Component Value Date   HDL 44 11/06/2014   Lab Results  Component Value Date   LDLCALC 94 11/06/2014   Lab Results  Component Value Date   TRIG 89 11/06/2014   Lab Results  Component Value Date   CHOLHDL 3.5 11/06/2014   Lab Results  Component Value Date   HGBA1C 5.2 12/31/2019      Assessment & Plan:   Problem List Items Addressed This Visit      Other   Tobacco use disorder    Other Visit Diagnoses    Low blood sugar    -  Primary   Relevant Medications   Blood Glucose Monitoring Suppl (ACCU-CHEK AVIVA PLUS) w/Device KIT   glucose blood (ACCU-CHEK AVIVA PLUS) test strip   Accu-Chek Softclix Lancets lancets   Other Relevant Orders   Glucose (CBG) (Completed)   HgB A1c (Completed)   Chronic foot pain, left       Relevant Orders   DG Foot Complete Left (Completed)   Hypoglycemia, unspecified  Relevant Medications   Blood Glucose Monitoring Suppl (ACCU-CHEK AVIVA PLUS) w/Device KIT   Other Relevant Orders   Glucose (CBG) (Completed)   Screening cholesterol level       Relevant Orders   Lipid Panel (Completed)   Chronic pain of both knees       Chronic pain of right knee       Relevant Orders   Arthritis Panel (Completed)      Low blood sugar - Glucose (CBG) - HgB A1c -  Blood Glucose Monitoring Suppl (ACCU-CHEK AVIVA PLUS) w/Device KIT; 1 each by Does not apply route 2 (two) times daily as needed.  Dispense: 1 kit; Refill: 0 - glucose blood (ACCU-CHEK AVIVA PLUS) test strip; Use as instructed  Dispense: 100 each; Refill: 12 - Accu-Chek Softclix Lancets lancets; Use as instructed  Dispense: 100 each; Refill: 12  Chronic foot pain, left - DG Foot Complete Left; Future   Hypoglycemia, unspecified - Blood Glucose Monitoring Suppl (ACCU-CHEK AVIVA PLUS) w/Device KIT; 1 each by Does not apply route 2 (two) times daily as needed.  Dispense: 1 kit; Refill: 0 - Glucose (CBG)  Screening cholesterol level - Lipid Panel   Chronic pain of right knee - Arthritis Panel  Tobacco use disorder Smoking cessation instruction/counseling given:  counseled patient on the dangers of tobacco use, advised patient to stop smoking, and reviewed strategies to maximize success  Follow-up: Return in about 6 months (around 07/02/2020).    Donia Pounds  APRN, MSN, FNP-C Patient [facility] [address] [place], [place] [address] [phone]

## 2020-01-01 ENCOUNTER — Ambulatory Visit (HOSPITAL_COMMUNITY)
Admission: RE | Admit: 2020-01-01 | Discharge: 2020-01-01 | Disposition: A | Discharge status: Home / Self Care | Payer: Medicaid Other | Source: Ambulatory Visit | Attending: Family Medicine | Admitting: Family Medicine

## 2020-01-01 DIAGNOSIS — G8929 Other chronic pain: Secondary | ICD-10-CM

## 2020-01-01 DIAGNOSIS — M79672 Pain in left foot: Secondary | ICD-10-CM | POA: Insufficient documentation

## 2020-01-01 LAB — LIPID PANEL
Chol/HDL Ratio: 2.5 ratio (ref 0.0–4.4)
Cholesterol, Total: 128 mg/dL (ref 100–199)
HDL: 52 mg/dL (ref >39)
LDL Chol Calc (NIH): 63 mg/dL (ref 0–99)
Triglycerides: 61 mg/dL (ref 0–149)
VLDL Cholesterol Cal: 13 mg/dL (ref 5–40)

## 2020-01-01 LAB — ARTHRITIS PANEL
Anti Nuclear Antibody (ANA): NEGATIVE
Rheumatoid fact SerPl-aCnc: <10 IU/mL (ref 0.0–13.9)
Sed Rate: 4 mm/hr (ref 0–32)
Uric Acid: 4.8 mg/dL (ref 2.6–6.2)

## 2020-01-02 ENCOUNTER — Other Ambulatory Visit: Payer: Self-pay | Admitting: Family Medicine

## 2020-01-02 ENCOUNTER — Telehealth: Payer: Self-pay

## 2020-01-02 DIAGNOSIS — M79672 Pain in left foot: Secondary | ICD-10-CM

## 2020-01-02 DIAGNOSIS — M79671 Pain in right foot: Secondary | ICD-10-CM

## 2020-01-02 NOTE — Telephone Encounter (Signed)
-----   Message from Dorena Dew, [place] sent at 01/02/2020 10:38 AM EDT ----- Regarding: xray results Please inform patient that left foot xray is unremarkable. Will send referral to podiatry for further workup and evaluation.    Donia Pounds  APRN, MSN, FNP-C Patient [facility] [place] [address] [place], [place] [address] [phone]

## 2020-01-02 NOTE — Telephone Encounter (Signed)
Patient informed. 

## 2020-01-02 NOTE — Progress Notes (Signed)
Orders Placed This Encounter  Procedures  . Ambulatory referral to Podiatry    Referral Priority:   Routine    Referral Type:   Consultation    Referral Reason:   Specialty Services Required    Requested Specialty:   Podiatry    Number of Visits Requested:   [place], MSN, FNP-C Patient [facility] [address] [place], [place] [address] [phone]

## 2020-01-07 ENCOUNTER — Other Ambulatory Visit: Payer: Self-pay | Admitting: Family Medicine

## 2020-01-07 DIAGNOSIS — H918X1 Other specified hearing loss, right ear: Secondary | ICD-10-CM

## 2020-01-07 NOTE — Progress Notes (Signed)
Orders Placed This Encounter  Procedures  . Ambulatory referral to Audiology    Referral Priority:   Routine    Referral Type:   Audiology Exam    Referral Reason:   Specialty Services Required    Number of Visits Requested:   [place], MSN, FNP-C Patient [facility] [address] [place], [place] [address] [phone]

## 2020-01-08 LAB — PANCREATIC ELASTASE, FECAL: Pancreatic Elastase-1, Stool: 114 mcg/g — ABNORMAL LOW

## 2020-01-09 ENCOUNTER — Other Ambulatory Visit: Payer: Self-pay | Admitting: Emergency Medicine

## 2020-01-09 MED ORDER — TRELEGY ELLIPTA 100-62.5-25 MCG/INH IN AEPB: 1.0000 | INHALATION_SPRAY | Freq: Every day | RESPIRATORY_TRACT | 1 refills | Status: DC

## 2020-01-20 ENCOUNTER — Other Ambulatory Visit: Payer: Self-pay | Admitting: Emergency Medicine

## 2020-01-20 MED ORDER — COLESTIPOL HCL 1 G PO TABS: 1.0000 g | ORAL_TABLET | ORAL | 1 refills | Status: DC

## 2020-01-20 NOTE — Progress Notes (Signed)
Received refill request fax from pharmacy. Rx sent to pharmacy.

## 2020-01-27 ENCOUNTER — Ambulatory Visit: Payer: Medicaid Other | Admitting: Gastroenterology

## 2020-02-05 ENCOUNTER — Other Ambulatory Visit: Payer: Self-pay

## 2020-02-05 ENCOUNTER — Encounter: Payer: Self-pay | Admitting: Obstetrics and Gynecology

## 2020-02-05 ENCOUNTER — Ambulatory Visit: Payer: Medicaid Other | Admitting: Obstetrics and Gynecology

## 2020-02-05 VITALS — BP 106/70 | HR 83 | Ht 66.0 in | Wt 236.0 lb

## 2020-02-05 DIAGNOSIS — N3946 Mixed incontinence: Secondary | ICD-10-CM

## 2020-02-05 MED ORDER — SOLIFENACIN SUCCINATE 10 MG PO TABS: 10.0000 mg | ORAL_TABLET | Freq: Every day | ORAL | 5 refills | Status: DC

## 2020-02-05 NOTE — Progress Notes (Signed)
Ms Falls presents for follow up of her urinary incont. Had been doing well with Vesicare until the last 2 months. Sx have returned to [facility] where she is wearing a pad daily. Denies any change in medications or medical history.  PE AF VSS Lungs clear Heart RRR Abd soft + BS  A/P Urinary incont Will check UC. Increase Vesicare to 10 mg and reevaluate in 2 months.

## 2020-02-07 LAB — URINE CULTURE

## 2020-02-08 ENCOUNTER — Other Ambulatory Visit: Payer: Self-pay | Admitting: Obstetrics and Gynecology

## 2020-02-11 ENCOUNTER — Encounter: Payer: Self-pay | Admitting: Gastroenterology

## 2020-02-11 ENCOUNTER — Ambulatory Visit: Payer: Medicaid Other | Admitting: Gastroenterology

## 2020-02-11 VITALS — BP 92/60 | HR 68 | Temp 97.6°F | Ht 66.0 in | Wt 237.0 lb

## 2020-02-11 DIAGNOSIS — R197 Diarrhea, unspecified: Secondary | ICD-10-CM | POA: Diagnosis not present

## 2020-02-11 DIAGNOSIS — R140 Abdominal distension (gaseous): Secondary | ICD-10-CM

## 2020-02-11 DIAGNOSIS — K8689 Other specified diseases of pancreas: Secondary | ICD-10-CM

## 2020-02-11 MED ORDER — RIFAXIMIN 550 MG PO TABS: 550.0000 mg | ORAL_TABLET | Freq: Three times a day (TID) | ORAL | 0 refills | Status: AC

## 2020-02-11 NOTE — Progress Notes (Signed)
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 Maybe overlap of symptoms with SIBO.  Diarrhea has largely improved on colestipol 3g QAM and 2gQPM with daily probiotics. Treat with antibiotics for possible SIBO. Add pancreatic enzymes if not responding to antibiotics.   She will be due another colonoscopy for colon cancer screening in 2030.  Recent hypokalemia has resolved based on recent labs.  Reflux controlled on Nexium 40 mg BID and famotidine 20 mg BID, although she prefers to dose them on a PRN basis.    PLAN: Continue Nexium 40 mg BID and famotodine 20 mg BID Add daily Benefiber Continue daily probiotics Continue dicyclomine 20 mg QID  Continue colestipol 3g QAM and 2g QPM Trial of Xifaxan 550 mg TID x 14 days    - if insurance does not approve Xifaxan will use Septra DS 1 po BID x 14 days If no response to antibiotics, start pancreatic enzymes Smoking cessation Colonoscopy 2030 Follow-up in 6-10 weeks, earlier if needed   HPI: Barbara Stanley is a  49 y.o. female under evaluation of diarrhea.  She returns in scheduled follow-up.  The interval history is obtained through the patient and review of her electronic health record. Gastric bypass performed 16 years ago [place]. Not on any long term supplements until she was hospitalized last year for diarrhea-induced hypokalemia and pancytopenia. Diarrhea has improved since starting vitamin L89 and folic acid for presumptive severe pancytopenia secondary to undernutrition and vitamin deficiencies with b12, folate, and iron. She has a history of chronic obstructive pulmonary disease; major depression; gastric bypass surgery 16 years ago; unilateral right hearing deficit; chronic anemia; gastroesophageal reflux disease; active tobacco dependence; degenerative joint disease involving the left shoulder, lower back, and knees bilaterally.  Initially seen for loose BM occuring 3-4 times in the morning. At it's worst, she was having 20 bowel movements daily. Most days she will not have any further diarrhea. Baseline bowel habits following the bypass are diarrhea - 4 slightly formed BM daily. Was also having diffuse and upper abdominal abdominal pain that occurs immediately after eating or while eating despite Nexium QAM and famotidine QPM. Associated nausea. Occassional postprandial bowel movement that improves the pain.   An upper GI series 07/12/2019 showed moderate gastroesophageal reflux, small hiatal hernia and inability to completely assess for esophageal motility given the patient's nausea during the exam.  However there was no significant delay in contrast passage through the esophagus.  Endoscopic evaluation 09/26/2019.  Upper endoscopy revealed a normal esophagus, small hiatal hernia, evidence of gastric bypass with a small pouch that appeared normal and a normal appearing anastomosis.  Duodenal biopsies were normal.  A colonoscopy performed at the tame time was normal.  Random colon biopsies from the right  and left colon for microscopic colitis were normal.  At the time of her office visit 10/28/19 she was having rare heartburn minimal nausea and significant improvement in her abdominal pain. Still occuring after eating but not every day. Pain and nausea resolve 15 minutes after defecation.  Symptoms had improved on colestipol 2 mg BID with two, soft formed stools daily. Her appetite increased and she had gained 3 pounds.  She called the office in mid-February reporting 3-5 BM daily. One day she felt the colestipol was no longer working. Will have a day of normal stools followed by a day of profound diarrhea.  Has a severe, upper abdominal cramping that precedes BM and resolves with defecation.  Colestipol increased to 3g QAM and 2 g QPM. She returns today in follow-up.  Despite the increase, she still has frequent BM although they are more formed.  Overall her abdominal symptoms have improved. Appetite is good. She is gaining weight. Rarely using Bentyl.   At the time of office follow-up 12/25/19 she was taking colestipol 2 mg BID with two soft, formed worms daily. Abdominal pain has resolved.Still having diarrhea. She was continued on colestipol 3g QAM and 2g QPM and dicyclomine. Benefiber, probiotics recommended. Follow-up stool studies with fecal elastase suggested PEI.   She returns today in schedule follow-up. Feeling better. Could not afford Align. Is now using Activa yogurt with improvement. Stools are more formed having 1-2 BM most bowel movements. Some days there is more diarrhea.  Symptoms are largely postprandial. Stools are floating. Associated gas and bloating. No associated malodor. Weight is stable.  She continues to be followed closely by Alvy Bimler. B12 and copper levels have been restored on replacement therapy. Iron infusion last month after poorly tolerating oral iron supplements. Dr. Calton Dach note from office visit 11/13/19 documents complete normalization of hemoglobin and WBC.  Thrombocytopenia persists although it's improved.   Recent labs: The results of the C. difficile quick screen with PCR reflex was negative from October 31.  Qualitative fecal lactoferrin was positive.  A stool culture for E. coli; Shigella toxin; Salmonella and Shigella were not identified. Campylobacter culture was negative. Empirically she was given a brief course of metronidazole.   Labs 06/07/19: WBC 5.7, hgb 13.6, platelets 49 Labs 07/04/19: normal CMP Labs 07/19/19: normal copper, Vit D, B12; normal CBC except for platelets of 38 Labs 08/25/2019: normal comprehensive metabolic panel except for a calcium of 8.3 and a total protein of 6.  White count 5.3, hemoglobin 12.3, MCV 90, RDW 14.1, platelets 46. Labs 11/07/19: iron 27, ferritin 5, saturation 550 Labs 12/25/19: normal BMP including potasium of 4.0, crt 0.67 Labs 12/31/19: pancreatic elastase 114  Prior endoscopy:  EGD for reflux several years ago "somewhere in [place]."  EGD 09/26/2019: normal esophagus, small hiatal hernia, evidence of gastric bypass with a small pouch normal-appearing anastomosis, and normal small bowel.  Duodenal biopsies were normal. Colonoscopy 09/26/2019: normal.  Random colon biopsies for microscopic colitis were normal.   Abdominal imaging: Abdominal ultrasound for abnormal liver enzymes 08/09/18:  common bile duct of 6.6 mm status post cholecystectomy.  Spleen is upper limit of normal at 12.6 cm. Transaminases at that time were elevated. They have since been repeatedly normal on several rechecks.  UGI series 07/12/19: Moderate gastroesophageal reflux. Small hiatal hernia. Esophageal motility could not be completely assessed as the patient became nauseated during the  exam and could not perform this portion. However throughout the exam no significant delay in contrast passage through the esophagus was observed. CT abd/pelvis with contrast 09/04/19: Unremarkable pancreas. No ductal dilatation or surrounding inflammatory  changes. Splenomegaly. Prior gastric bypass. Multiple ventral well abdominal hernias that contain fat. Thickened appendix with diffuse intramural fatty deposition. No stranding or free fluid.   Past Medical History:  Diagnosis Date  . Abdominal pain   . Anemia   . Anxiety   . Bronchitis   . COPD (chronic obstructive pulmonary disease) ([place])   . Depression    bipolar  . Diarrhea   . Emphysema   . GERD (gastroesophageal reflux disease)   . History of blood transfusion   . Splenomegaly    r/t low platelets  . Thrombocytopenia ([place])   . Tobacco abuse     Past Surgical History:  Procedure Laterality Date  . ABDOMINAL HYSTERECTOMY    . c section x 3     . CHOLECYSTECTOMY    . GASTRIC BYPASS    . UPPER GASTROINTESTINAL ENDOSCOPY    . WISDOM TOOTH EXTRACTION      Current Outpatient Medications  Medication Sig Dispense Refill  . Accu-Chek Softclix Lancets lancets Use as instructed 100 each 12  . acetaminophen (TYLENOL) 500 MG tablet Take 1,000 mg by mouth daily as needed for moderate pain.    [name] albuterol (PROAIR HFA) 108 (90 Base) MCG/ACT inhaler Inhale 2 puffs into the lungs every 6 (six) hours as needed for wheezing or shortness of breath. 17 g 3  . albuterol (PROVENTIL) (2.5 MG/3ML) 0.083% nebulizer solution Take 3 mLs (2.5 mg total) by nebulization every 4 (four) hours as needed for wheezing or shortness of breath. 75 mL 5  . Blood Glucose Monitoring Suppl (ACCU-CHEK AVIVA PLUS) w/Device KIT 1 each by Does not apply route 2 (two) times daily as needed. 1 kit 0  . cholecalciferol (VITAMIN D3) 25 MCG (1000 UT) tablet Take 5,000 Units by mouth daily.    . colestipol (COLESTID) 1 g tablet Take 1 tablet (1 g total) by mouth as directed. 3 tablets every am and 2 tablets every pm 150 tablet 1  . Copper Gluconate 2 MG CAPS Take 4 mg by mouth daily.    . Cyanocobalamin (VITAMIN B-12 IJ) Inject 1 mg as directed as directed. Every 3 months    . diclofenac sodium (VOLTAREN) 1 % GEL Apply 2 g  topically 4 (four) times daily. 2 g 3  . dicyclomine (BENTYL) 20 MG tablet Take 1 tablet (20 mg total) by mouth as needed for spasms. 120 tablet 1  . esomeprazole (NEXIUM) 40 MG capsule TAKE 1 CAPSULE(40 MG) BY MOUTH TWICE DAILY BEFORE A MEAL 60 capsule 3  . famotidine (PEPCID) 40 MG tablet Take 1 tablet (40 mg total) by mouth 2 (two) times daily. 60 tablet 3  . Fluticasone-Umeclidin-Vilant (TRELEGY ELLIPTA) 100-62.5-25 MCG/INH AEPB Inhale 1 puff into the lungs daily. 60 each 1  . folic acid (FOLVITE) 1 MG tablet Take 1 tablet (1 mg total) by mouth daily. 90 tablet 2  . glucose blood (ACCU-CHEK AVIVA PLUS) test strip Use as instructed 100 each 12  . solifenacin (VESICARE) 10 MG tablet Take 1 tablet (10 mg total) by mouth daily. 30 tablet 5  . traZODone (DESYREL) 150 MG tablet Take 1 tablet (150 mg total) by mouth at bedtime. For sleep (Patient taking differently: Take 300 mg by mouth at bedtime. For sleep) 30 tablet 0  . venlafaxine  XR (EFFEXOR-XR) 75 MG 24 hr capsule Take 1 capsule (75 mg total) by mouth daily with breakfast. For depression 30 capsule 0   No current facility-administered medications for this visit.    Allergies as of 02/11/2020 - Review Complete 02/11/2020  Allergen Reaction Noted  . Other  08/08/2018    Family History  Problem Relation Age of Onset  . Diabetes Mother   . Diabetes Other   . Hypertension Other   . CAD Other   . Heart disease Father   . Colon cancer Neg Hx   . Rectal cancer Neg Hx   . Stomach cancer Neg Hx   . Esophageal cancer Neg Hx     Social History   Socioeconomic History  . Marital status: Married    Spouse name: Not on file  . Number of children: 3  . Years of education: Not on file  . Highest education level: Not on file  Occupational History  . Occupation: disabled  Tobacco Use  . Smoking status: Current Every Day Smoker    Packs/day: 1.00    Years: 30.00    Pack years: 30.00    Types: Cigarettes  . Smokeless tobacco: Never  Used  . Tobacco comment: tobacco info given 10/28/2019  Substance and Sexual Activity  . Alcohol use: Yes    Comment: occasional wine  . Drug use: No  . Sexual activity: Yes    Birth control/protection: None    Comment: Hysterectomy  Other Topics Concern  . Not on file  Social History Narrative  . Not on file   Social Determinants of Health   Financial Resource Strain:   . Difficulty of Paying Living Expenses:   Food Insecurity:   . Worried About Charity fundraiser in the Last Year:   . Arboriculturist in the Last Year:   Transportation Needs:   . Film/video editor (Medical):   [name] Lack of Transportation (Non-Medical):   Physical Activity:   . Days of Exercise per Week:   . Minutes of Exercise per Session:   Stress:   . Feeling of Stress :   Social Connections:   . Frequency of Communication with Friends and Family:   . Frequency of Social Gatherings with Friends and Family:   . Attends Religious Services:   . Active Member of Clubs or Organizations:   . Attends Archivist Meetings:   [name] Marital Status:   Intimate Partner Violence:   . Fear of Current or Ex-Partner:   . Emotionally Abused:   [name] Physically Abused:   . Sexually Abused:      Physical Exam: Vital signs were reviewed. General:   Alert, well-nourished, pleasant and cooperative in NAD Abdomen:  Soft, nontender, central obesity, normal bowel sounds. No rebound or guarding. No hepatosplenomegaly. Well healed midline surgical scar.  Neurologic:  Alert and  oriented x4;  grossly nonfocal Skin:  No rash or bruise. Palmar erythema present. No spider angioma.  Psych:  Alert and cooperative. Normal mood and affect.   Aniello Christopoulos L. Tarri Glenn Md, MPH [facility] 02/11/2020, 8:56 AM

## 2020-02-11 NOTE — Patient Instructions (Addendum)
I am glad that you are feeling better but I think there is still room to improve your bowel habits.   I recommend that you continue the following medications: - Nexium 40 mg twice daily  - Famotodine 20 mg twice daily - Daily probiotics - such as  Align. - Dicyclomine 20 mg as needed up to 4 times daily - Colestipol3g every morning and2g every evening  Add daily Benefiber.  I am recommending a trial of antibiotics to reset your gut bacteria. We will work to get this approved with your insurance company.  [facility] will contact you regarding this.  Let's plan to see each other in 6-8 weeks, or earlier if needed. Follow up appointment on 04/01/20 at 8:30 am  Thank you for trusting me with your gastrointestinal care!    Thornton Park, MD, MPH  If you are age 62 or older, your body mass index should be between 23-30. Your Body mass index is 38.25 kg/m. If this is out of the aforementioned range listed, please consider follow up with your Primary Care Provider.  If you are age 55 or younger, your body mass index should be between 19-25. Your Body mass index is 38.25 kg/m. If this is out of the aformentioned range listed, please consider follow up with your Primary Care Provider.

## 2020-02-12 ENCOUNTER — Other Ambulatory Visit: Payer: Self-pay | Admitting: Obstetrics and Gynecology

## 2020-02-13 ENCOUNTER — Other Ambulatory Visit: Payer: Self-pay

## 2020-02-13 ENCOUNTER — Ambulatory Visit: Payer: Medicaid Other | Attending: Family Medicine | Admitting: Audiologist

## 2020-02-13 DIAGNOSIS — H90A11 Conductive hearing loss, unilateral, right ear with restricted hearing on the contralateral side: Secondary | ICD-10-CM | POA: Diagnosis present

## 2020-02-13 DIAGNOSIS — H9042 Sensorineural hearing loss, unilateral, left ear, with unrestricted hearing on the contralateral side: Secondary | ICD-10-CM | POA: Insufficient documentation

## 2020-02-13 DIAGNOSIS — H905 Unspecified sensorineural hearing loss: Secondary | ICD-10-CM

## 2020-02-13 NOTE — Procedures (Signed)
  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 She reports difficulty hearing on the phone and in background noise. She feels like there is a blockage in the right ear but denies any pain pressure or tinnitus. No known family history of hearing loss. No other relevant medical case history reported.   Evaluation:   Otoscopy showed a clear view of the tympanic membranes, bilaterally  Tympanometry results were consistent with normal function of the middle ear.   Audiometric testing was completed using conventional audiometry over [facility] headphones. Pure tone results show normal hearing sloping after 2,000 Hz to a mild high frequency loss in both ears. Conductive component (15dB ABG) present at 4,000 Hz only in the right ear. Speech Recognition Thresholds obtained at 25 dB in the right ear and 20dB in the left ear. Word recognition excellent (100%) in quiet at 40 dB SL in both ears. Word recognition in noise (5dB SNR) was fair (76%) in the right ear and excellent (100%) in the left ear.   Results:  The test results were reviewed with [facility]. She was advised of the air bone gap in her hearing in the right ear at 4,000Hz . If Barbara Stanley develops pain or pressure in the right ear she is should see an ENT Physician. The current loss does not warrant a referral at this time. Barbara Stanley is not a candidate for amplification at this time.   Recommendations: 1.   Barbara Stanley should monitor hear hearing loss with audiometric test every other year, or sooner if she  notices a change in hearing.  [place] should see an ENT Physician id she develops pain or pressure in the right ear.   Alfonse Alpers AuD CCC-A Audiologist   02/13/2020  9:42 AM  Cc: Dorena Dew, FNP

## 2020-02-24 ENCOUNTER — Other Ambulatory Visit: Payer: Self-pay

## 2020-02-24 ENCOUNTER — Ambulatory Visit (INDEPENDENT_AMBULATORY_CARE_PROVIDER_SITE_OTHER): Payer: Medicaid Other

## 2020-02-24 ENCOUNTER — Ambulatory Visit: Payer: Medicaid Other | Admitting: Podiatry

## 2020-02-24 DIAGNOSIS — M79671 Pain in right foot: Secondary | ICD-10-CM

## 2020-02-24 DIAGNOSIS — M722 Plantar fascial fibromatosis: Secondary | ICD-10-CM

## 2020-02-24 DIAGNOSIS — M7671 Peroneal tendinitis, right leg: Secondary | ICD-10-CM

## 2020-02-24 MED ORDER — METHYLPREDNISOLONE 4 MG PO TBPK: ORAL_TABLET | ORAL | 0 refills | Status: DC

## 2020-02-24 MED ORDER — MELOXICAM 15 MG PO TABS: 15.0000 mg | ORAL_TABLET | Freq: Every day | ORAL | 1 refills | Status: DC

## 2020-02-27 NOTE — Progress Notes (Signed)
   Subjective: 49 y.o. female presenting today as a new patient with a chief complaint of sharp, throbbing pain to the bilateral feet that began about 4 months ago. She states the pain is located on the lateral left heel and right lateral foot. She states the pain is the worst in the morning when she first gets out of bed. Walking also makes the pain worse. She has been wearing good shoes and taking Tylenol for treatment. Patient is here for further evaluation and treatment.   Past Medical History:  Diagnosis Date  . Abdominal pain   . Anemia   . Anxiety   . Bronchitis   . COPD (chronic obstructive pulmonary disease) ([place])   . Depression    bipolar  . Diarrhea   . Emphysema   . GERD (gastroesophageal reflux disease)   . History of blood transfusion   . Splenomegaly    r/t low platelets  . Thrombocytopenia ([place])   . Tobacco abuse      Objective: Physical Exam General: The patient is alert and oriented x3 in no acute distress.  Dermatology: Skin is warm, dry and supple bilateral lower extremities. Negative for open lesions or macerations bilateral.   Vascular: Dorsalis Pedis and Posterior Tibial pulses palpable bilateral.  Capillary fill time is immediate to all digits.  Neurological: Epicritic and protective threshold intact bilateral.   Musculoskeletal: Tenderness to palpation to the plantar aspect of the left heel along the plantar fascia. Pain with palpation to the insertion of the peroneal tendon of the right foot. All other joints range of motion within normal limits bilateral. Strength 5/5 in all groups bilateral.   Radiographic exam: Normal osseous mineralization. Joint spaces preserved. No fracture/dislocation/boney destruction. No other soft tissue abnormalities or radiopaque foreign bodies.   Assessment: 1. Plantar fasciitis left foot 2. Insertional peroneal tendinitis right   Plan of Care:  1. Patient evaluated. Xrays reviewed.   2. Injection of 0.5cc Celestone  soluspan injected into the left plantar fascia.  3. Rx for Medrol Dose Pak placed 4. Rx for Meloxicam ordered for patient. 5. Plantar fascial band(s) dispensed  6. Instructed patient regarding therapies and modalities at home to alleviate symptoms.  7. Continue wearing soft, wide-fitting sneakers.  8. Return to clinic in 4 weeks.     Edrick Kins, DPM [facility]  Dr. Edrick Kins, DPM    [address], [place] [address]                Office [phone]  Fax [phone]

## 2020-02-28 ENCOUNTER — Other Ambulatory Visit: Payer: Self-pay | Admitting: Podiatry

## 2020-02-28 DIAGNOSIS — M7671 Peroneal tendinitis, right leg: Secondary | ICD-10-CM

## 2020-02-28 DIAGNOSIS — M722 Plantar fascial fibromatosis: Secondary | ICD-10-CM

## 2020-03-05 ENCOUNTER — Other Ambulatory Visit: Payer: Self-pay | Admitting: Hematology and Oncology

## 2020-03-05 ENCOUNTER — Encounter: Payer: Self-pay | Admitting: Hematology and Oncology

## 2020-03-05 ENCOUNTER — Inpatient Hospital Stay: Payer: Medicaid Other | Attending: Hematology and Oncology

## 2020-03-05 ENCOUNTER — Other Ambulatory Visit: Payer: Self-pay

## 2020-03-05 DIAGNOSIS — E538 Deficiency of other specified B group vitamins: Secondary | ICD-10-CM | POA: Diagnosis not present

## 2020-03-05 DIAGNOSIS — D693 Immune thrombocytopenic purpura: Secondary | ICD-10-CM | POA: Insufficient documentation

## 2020-03-05 DIAGNOSIS — E559 Vitamin D deficiency, unspecified: Secondary | ICD-10-CM

## 2020-03-05 DIAGNOSIS — E876 Hypokalemia: Secondary | ICD-10-CM

## 2020-03-05 DIAGNOSIS — E610 Copper deficiency: Secondary | ICD-10-CM | POA: Diagnosis not present

## 2020-03-05 DIAGNOSIS — Z9884 Bariatric surgery status: Secondary | ICD-10-CM | POA: Insufficient documentation

## 2020-03-05 DIAGNOSIS — Z79899 Other long term (current) drug therapy: Secondary | ICD-10-CM | POA: Insufficient documentation

## 2020-03-05 DIAGNOSIS — K5909 Other constipation: Secondary | ICD-10-CM | POA: Diagnosis not present

## 2020-03-05 DIAGNOSIS — D61818 Other pancytopenia: Secondary | ICD-10-CM

## 2020-03-05 LAB — BASIC METABOLIC PANEL - CANCER CENTER ONLY
Anion gap: 9 (ref 5–15)
BUN: 13 mg/dL (ref 6–20)
CO2: 28 mmol/L (ref 22–32)
Calcium: 8.5 mg/dL — ABNORMAL LOW (ref 8.9–10.3)
Chloride: 102 mmol/L (ref 98–111)
Creatinine: 0.78 mg/dL (ref 0.44–1.00)
GFR, Est AFR Am: >60 mL/min (ref >60)
GFR, Estimated: >60 mL/min (ref >60)
Glucose, Bld: 82 mg/dL (ref 70–99)
Potassium: 4.2 mmol/L (ref 3.5–5.1)
Sodium: 139 mmol/L (ref 135–145)

## 2020-03-05 LAB — CBC WITH DIFFERENTIAL/PLATELET
Abs Immature Granulocytes: 0.03 10*3/uL (ref 0.00–0.07)
Basophils Absolute: 0.1 10*3/uL (ref 0.0–0.1)
Basophils Relative: 1 %
Eosinophils Absolute: 0 10*3/uL (ref 0.0–0.5)
Eosinophils Relative: 1 %
HCT: 43 % (ref 36.0–46.0)
Hemoglobin: 14.1 g/dL (ref 12.0–15.0)
Immature Granulocytes: 1 %
Lymphocytes Relative: 14 %
Lymphs Abs: 0.7 10*3/uL (ref 0.7–4.0)
MCH: 29.9 pg (ref 26.0–34.0)
MCHC: 32.8 g/dL (ref 30.0–36.0)
MCV: 91.3 fL (ref 80.0–100.0)
Monocytes Absolute: 0.3 10*3/uL (ref 0.1–1.0)
Monocytes Relative: 5 %
Neutro Abs: 4 10*3/uL (ref 1.7–7.7)
Neutrophils Relative %: 78 %
Platelets: 28 10*3/uL — ABNORMAL LOW (ref 150–400)
RBC: 4.71 MIL/uL (ref 3.87–5.11)
RDW: 15.5 % (ref 11.5–15.5)
WBC: 5.1 10*3/uL (ref 4.0–10.5)
nRBC: 0 % (ref 0.0–0.2)

## 2020-03-05 LAB — IRON AND TIBC
Iron: 108 ug/dL (ref 41–142)
Saturation Ratios: 26 % (ref 21–57)
TIBC: 413 ug/dL (ref 236–444)
UIBC: 304 ug/dL (ref 120–384)

## 2020-03-05 LAB — VITAMIN D 25 HYDROXY (VIT D DEFICIENCY, FRACTURES): Vit D, 25-Hydroxy: 28.12 ng/mL — ABNORMAL LOW (ref 30–100)

## 2020-03-05 LAB — FERRITIN: Ferritin: 21 ng/mL (ref 11–307)

## 2020-03-05 LAB — VITAMIN B12: Vitamin B-12: 341 pg/mL (ref 180–914)

## 2020-03-10 LAB — COPPER, SERUM: Copper: 103 ug/dL (ref 80–158)

## 2020-03-11 ENCOUNTER — Other Ambulatory Visit: Payer: Self-pay | Admitting: *Deleted

## 2020-03-12 ENCOUNTER — Other Ambulatory Visit: Payer: Self-pay

## 2020-03-12 ENCOUNTER — Inpatient Hospital Stay (HOSPITAL_BASED_OUTPATIENT_CLINIC_OR_DEPARTMENT_OTHER): Payer: Medicaid Other | Admitting: Hematology and Oncology

## 2020-03-12 ENCOUNTER — Encounter: Payer: Self-pay | Admitting: Hematology and Oncology

## 2020-03-12 ENCOUNTER — Other Ambulatory Visit: Payer: Self-pay | Admitting: Gastroenterology

## 2020-03-12 ENCOUNTER — Inpatient Hospital Stay: Payer: Medicaid Other

## 2020-03-12 VITALS — BP 128/68 | Temp 98.2°F

## 2020-03-12 DIAGNOSIS — K909 Intestinal malabsorption, unspecified: Secondary | ICD-10-CM

## 2020-03-12 DIAGNOSIS — E610 Copper deficiency: Secondary | ICD-10-CM | POA: Diagnosis not present

## 2020-03-12 DIAGNOSIS — E538 Deficiency of other specified B group vitamins: Secondary | ICD-10-CM | POA: Diagnosis not present

## 2020-03-12 DIAGNOSIS — Z9884 Bariatric surgery status: Secondary | ICD-10-CM

## 2020-03-12 DIAGNOSIS — D693 Immune thrombocytopenic purpura: Secondary | ICD-10-CM

## 2020-03-12 DIAGNOSIS — E559 Vitamin D deficiency, unspecified: Secondary | ICD-10-CM

## 2020-03-12 DIAGNOSIS — D696 Thrombocytopenia, unspecified: Secondary | ICD-10-CM

## 2020-03-12 DIAGNOSIS — D509 Iron deficiency anemia, unspecified: Secondary | ICD-10-CM

## 2020-03-12 MED ORDER — CYANOCOBALAMIN 1000 MCG/ML IJ SOLN
INTRAMUSCULAR | Status: AC
  Filled 2020-03-12: qty 1

## 2020-03-12 MED ORDER — CYANOCOBALAMIN 1000 MCG/ML IJ SOLN
1000.0000 ug | Freq: Once | INTRAMUSCULAR | Status: AC
  Administered 2020-03-12: 1000 ug via INTRAMUSCULAR

## 2020-03-12 NOTE — Patient Instructions (Signed)

## 2020-03-12 NOTE — Assessment & Plan Note (Signed)
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

## 2020-03-12 NOTE — Assessment & Plan Note (Signed)
She has borderline low vitamin D level She will continue over-the-counter high-dose vitamin D replacement therapy

## 2020-03-12 NOTE — Progress Notes (Signed)
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 However, despite aggressive mineral replacement therapy, she continues to have persistent, chronic thrombocytopenia Currently, the best medical condition to fit the current description of chronic thrombocytopenia is ITP We discussed the risk and benefits of treatment versus observation We discussed the risk and benefits of corticosteroid therapy versus rituximab She is undecided She will call me back after she has made her final decision about plan of care She does not need transfusion support right now  Vitamin B12 deficiency due to intestinal malabsorption She continues to have borderline vitamin B12 deficiency She will receive B12 injection today  Vitamin D deficiency She has borderline low vitamin D level She will continue over-the-counter high-dose vitamin D replacement therapy  Dietary copper deficiency This is adequately replaced She will continue chronic copper supplement   No orders of the defined types were placed in this encounter.   The total time spent in the appointment was 20 minutes encounter with patients including review of chart and various tests results, discussions about plan of care and coordination of care plan   All questions were answered. The patient knows to call the clinic with any problems, questions or concerns. No barriers to learning was detected.    Heath Lark, MD 5/27/20213:47 PM  INTERVAL HISTORY: Barbara Stanley 49 y.o. 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  SUMMARY OF HEMATOLOGIC HISTORY: Oncology History Overview Note  Cytogenetics showed 2 cell lines.  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  Starting around 2018, she was noted to have progressive mild pancytopenia.  Most recently, around October 2019, she is noted to have significant pancytopenia. She was referred to see hematologist for evaluation.  The patient has history of gastric bypass surgery.  She has significant recent weight loss. Her primary care doctor has ordered multiple mineral panel.  She was noted to have folate, B6, B12, copper as well as iron deficiency.  She has received multiple mineral replacement therapy.  When she was seen here at the hematology clinic, she received high-dose vitamin B12 injection. On August 09, 2018, she underwent ultrasound of her abdomen which showed no evidence of splenomegaly.  The patient has significant smoking history but denies alcohol intake She denies significant recent infection requiring intravenous antibiotic treatment. Infectious work-up was negative. Between October to December 2019, she has received intravenous iron infusion along with vitamin B12 injection. On October 01, 2018, she underwent bone marrow aspirate and biopsy to evaluate for pancytopenia.  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  She was given vitamin B12 injection, oral copper and oral vitamin D replacement therapy   10/01/2018 Bone Marrow Biopsy   Bone Marrow,  Aspirate,Biopsy, and Clot -  SLIGHTLY HYPERCELLULAR BONE MARROW FOR AGE WITH DYSPOIETIC CHANGES. - SEE COMMENT. PERIPHERAL BLOOD: - PANCYTOPENIA. Diagnosis Note The bone marrow is slightly hypercellular for age with trilineage hematopoiesis but with relative abundance of erythroid precursors. This is associated with dyspoietic changes variably involving myeloid cell lines but with no increase in blastic cells The differential diagnosis includes secondary changes due to nutritional deficiency, medication, infection, autoimmune disease, toxins, etc. as well as a primary clonal myelodysplastic process. Correlation with cytogenetic and FISH studies is recommended. (BNS:kh 10/02/18)      I have reviewed the past medical history, past surgical history, social history and family history with the patient and they are unchanged from previous note.  ALLERGIES:  is allergic to other.  MEDICATIONS:  Current Outpatient Medications  Medication Sig Dispense Refill  . Accu-Chek Softclix Lancets lancets Use as instructed 100 each 12  . acetaminophen (TYLENOL) 500 MG tablet Take 1,000 mg by mouth daily as needed for moderate pain.    [name] albuterol (PROAIR HFA) 108 (90 Base) MCG/ACT inhaler Inhale 2 puffs into the lungs every 6 (six) hours as needed for wheezing or shortness of breath. 17 g 3  . albuterol (PROVENTIL) (2.5 MG/3ML) 0.083% nebulizer solution Take 3 mLs (2.5 mg total) by nebulization every 4 (four) hours as needed for wheezing or shortness of breath. 75 mL 5  . Blood Glucose Monitoring Suppl (ACCU-CHEK AVIVA PLUS) w/Device KIT 1 each by Does not apply route 2 (two) times daily as needed. 1 kit 0  . cholecalciferol (VITAMIN D3) 25 MCG (1000 UT) tablet Take 5,000 Units by mouth daily.    . colestipol (COLESTID) 1 g tablet Take 1 tablet (1 g total) by mouth as directed. 3 tablets every am and 2 tablets every pm 150 tablet 1  . Copper Gluconate 2 MG CAPS Take 4 mg by mouth daily.    . Cyanocobalamin (VITAMIN B-12 IJ) Inject 1 mg as  directed as directed. Every 3 months    . diclofenac sodium (VOLTAREN) 1 % GEL Apply 2 g topically 4 (four) times daily. 2 g 3  . dicyclomine (BENTYL) 20 MG tablet Take 1 tablet (20 mg total) by mouth as needed for spasms. 120 tablet 1  . esomeprazole (NEXIUM) 40 MG capsule TAKE 1 CAPSULE(40 MG) BY MOUTH TWICE DAILY BEFORE A MEAL 60 capsule 3  . famotidine (PEPCID) 40 MG tablet Take 1 tablet (40 mg total) by mouth 2 (two) times daily. 60 tablet 3  . Fluticasone-Umeclidin-Vilant (TRELEGY ELLIPTA) 100-62.5-25 MCG/INH AEPB Inhale 1 puff into the lungs daily. 60 each 1  . folic acid (FOLVITE) 1 MG tablet Take 1 tablet (1 mg total) by mouth daily. 90 tablet 2  . glucose blood (ACCU-CHEK AVIVA PLUS) test strip Use as instructed 100 each 12  . meloxicam (MOBIC) 15 MG tablet Take 1 tablet (15 mg total) by mouth daily. 30 tablet 1  . solifenacin (VESICARE) 10 MG tablet Take 1 tablet (10 mg total) by mouth daily. 30 tablet 5  . traZODone (DESYREL) 150 MG tablet Take 1 tablet (150 mg total) by mouth at bedtime. For sleep (Patient taking differently: Take 300 mg by mouth at bedtime. For sleep) 30 tablet 0  . venlafaxine XR (EFFEXOR-XR) 75 MG 24 hr capsule Take 1 capsule (75 mg total) by mouth daily with breakfast. For depression 30 capsule 0   No current facility-administered medications for this visit.     REVIEW OF SYSTEMS:   Constitutional: Denies fevers,  chills or night sweats Eyes: Denies blurriness of vision Ears, nose, mouth, throat, and face: Denies mucositis or sore throat Respiratory: Denies cough, dyspnea or wheezes Cardiovascular: Denies palpitation, chest discomfort or lower extremity swelling Gastrointestinal:  Denies nausea, heartburn or change in bowel habits Skin: Denies abnormal skin rashes Lymphatics: Denies new lymphadenopathy Neurological:Denies numbness, tingling or new weaknesses Behavioral/Psych: Mood is stable, no new changes  All other systems were reviewed with the patient  and are negative.  PHYSICAL EXAMINATION: ECOG PERFORMANCE STATUS: 0 - Asymptomatic  Vitals:   03/12/20 0901  BP: 127/74  Pulse: 88  Resp: 18  Temp: 98.4 F (36.9 C)  SpO2: 95%   Filed Weights   03/12/20 0901  Weight: 233 lb 3.2 oz (105.8 kg)    GENERAL:alert, no distress and comfortable  NEURO: alert & oriented x 3 with fluent speech, no focal motor/sensory deficits  LABORATORY DATA:  I have reviewed the data as listed     Component Value Date/Time   NA 139 03/05/2020 1039   NA 140 07/04/2019 1110   K 4.2 03/05/2020 1039   CL 102 03/05/2020 1039   CO2 28 03/05/2020 1039   GLUCOSE 82 03/05/2020 1039   BUN 13 03/05/2020 1039   BUN 13 07/04/2019 1110   CREATININE 0.78 03/05/2020 1039   CREATININE 0.59 08/07/2017 1410   CALCIUM 8.5 (L) 03/05/2020 1039   PROT 6.0 (L) 09/04/2019 1353   PROT 6.2 07/04/2019 1110   ALBUMIN 3.8 09/04/2019 1353   ALBUMIN 4.5 07/04/2019 1110   AST 29 09/04/2019 1353   ALT 39 09/04/2019 1353   ALKPHOS 86 09/04/2019 1353   BILITOT 0.3 09/04/2019 1353   BILITOT 0.4 07/04/2019 1110   GFRNONAA >60 03/05/2020 1039   GFRNONAA 110 08/07/2017 1410   GFRAA >60 03/05/2020 1039   GFRAA 127 08/07/2017 1410    No results found for: SPEP, UPEP  Lab Results  Component Value Date   WBC 5.1 03/05/2020   NEUTROABS 4.0 03/05/2020   HGB 14.1 03/05/2020   HCT 43.0 03/05/2020   MCV 91.3 03/05/2020   PLT 28 (L) 03/05/2020      Chemistry      Component Value Date/Time   NA 139 03/05/2020 1039   NA 140 07/04/2019 1110   K 4.2 03/05/2020 1039   CL 102 03/05/2020 1039   CO2 28 03/05/2020 1039   BUN 13 03/05/2020 1039   BUN 13 07/04/2019 1110   CREATININE 0.78 03/05/2020 1039   CREATININE 0.59 08/07/2017 1410      Component Value Date/Time   CALCIUM 8.5 (L) 03/05/2020 1039   ALKPHOS 86 09/04/2019 1353   AST 29 09/04/2019 1353   ALT 39 09/04/2019 1353   BILITOT 0.3 09/04/2019 1353   BILITOT 0.4 07/04/2019 1110

## 2020-03-12 NOTE — Assessment & Plan Note (Signed)
She continues to have borderline vitamin B12 deficiency She will receive B12 injection today

## 2020-03-12 NOTE — Assessment & Plan Note (Signed)
This is adequately replaced She will continue chronic copper supplement

## 2020-03-13 ENCOUNTER — Encounter: Payer: Self-pay | Admitting: Hematology and Oncology

## 2020-03-17 ENCOUNTER — Other Ambulatory Visit: Payer: Self-pay | Admitting: Hematology and Oncology

## 2020-03-17 DIAGNOSIS — D693 Immune thrombocytopenic purpura: Secondary | ICD-10-CM

## 2020-03-17 NOTE — Progress Notes (Signed)
START OFF PATHWAY REGIMEN - Other   [id number]:Rituximab (IV/Subcut) D1 Weekly:   A cycle is every 7 days:     Rituximab-xxxx      Rituximab and hyaluronidase human   **Always confirm dose/schedule in your pharmacy ordering system**  Administration Notes: ITP  Patient Characteristics: Intent of Therapy: Curative Intent, Discussed with Patient

## 2020-03-18 ENCOUNTER — Telehealth: Payer: Self-pay | Admitting: Hematology and Oncology

## 2020-03-18 NOTE — Telephone Encounter (Signed)
Scheduled per 6/1 sch message. Pt aware of appts. mychart active.

## 2020-03-23 ENCOUNTER — Encounter: Payer: Self-pay | Admitting: Obstetrics and Gynecology

## 2020-03-23 ENCOUNTER — Ambulatory Visit: Payer: Medicaid Other | Admitting: Podiatry

## 2020-03-23 ENCOUNTER — Ambulatory Visit: Payer: Medicaid Other | Admitting: Obstetrics and Gynecology

## 2020-03-23 ENCOUNTER — Other Ambulatory Visit: Payer: Self-pay

## 2020-03-23 ENCOUNTER — Encounter: Payer: Self-pay | Admitting: Podiatry

## 2020-03-23 VITALS — BP 106/70 | HR 81 | Ht 66.0 in | Wt 233.0 lb

## 2020-03-23 DIAGNOSIS — N3946 Mixed incontinence: Secondary | ICD-10-CM | POA: Diagnosis not present

## 2020-03-23 DIAGNOSIS — M722 Plantar fascial fibromatosis: Secondary | ICD-10-CM

## 2020-03-23 DIAGNOSIS — M7671 Peroneal tendinitis, right leg: Secondary | ICD-10-CM

## 2020-03-23 MED ORDER — MIRABEGRON ER 25 MG PO TB24: 25.0000 mg | ORAL_TABLET | Freq: Every day | ORAL | 5 refills | Status: DC

## 2020-03-23 NOTE — Progress Notes (Signed)
GYN presents for FU to urinary incontinence. C/o still having a lot of accidents, sometimes she is able to get to the bathroom in time.

## 2020-03-23 NOTE — Progress Notes (Signed)
Ms Gangwer presents for follow up of her urinary incontinence. See prior office notes. Vesicare was increased to 10 mg at last visit. She has not a little improvement 20-25 %. Still has accidents and wears a pad.  PE AF VSS Lungs clear Heart RRR  Abd soft + BS  A/P Urinary incont  Discussed Tx options with pt. She has completed GYN PT and continues to perform exercises. Will switch to Myrbetriq. F/U in 2 months

## 2020-03-24 MED ORDER — TRELEGY ELLIPTA 100-62.5-25 MCG/INH IN AEPB: 1.0000 | INHALATION_SPRAY | Freq: Every day | RESPIRATORY_TRACT | 1 refills | Status: DC

## 2020-03-24 NOTE — Progress Notes (Signed)
Pharmacist Chemotherapy Monitoring - Initial Assessment    Anticipated start date: 03/27/20   Regimen:   Are orders appropriate based on the patients diagnosis, regimen, and cycle? Yes  Does the plan date match the patients scheduled date? Yes  Is the sequencing of drugs appropriate? Yes  Are the premedications appropriate for the patients regimen? Yes  Prior Authorization for treatment is: Not Started o If applicable, is the correct biosimilar selected based on the patient's insurance? yes  Organ Function and Labs:  Are dose adjustments needed based on the patient's renal function, hepatic function, or hematologic function? No  Are appropriate labs ordered prior to the start of patient's treatment? Yes  Other organ system assessment, if indicated: N/A  The following baseline labs, if indicated, have been ordered: rituximab: baseline Hepatitis B labs  Dose Assessment:  Are the drug doses appropriate? Yes  Are the following correct: o Drug concentrations Yes o IV fluid compatible with drug Yes o Administration routes Yes o Timing of therapy Yes  If applicable, does the patient have documented access for treatment and/or plans for port-a-cath placement? not applicable  If applicable, have lifetime cumulative doses been properly documented and assessed? yes Lifetime Dose Tracking  No doses have been documented on this patient for the following tracked chemicals: Doxorubicin, Epirubicin, Idarubicin, Daunorubicin, Mitoxantrone, Bleomycin, Oxaliplatin, Carboplatin, Liposomal Doxorubicin  o   Toxicity Monitoring/Prevention:  The patient has the following take home antiemetics prescribed: N/A  The patient has the following take home medications prescribed: N/A  Medication allergies and previous infusion related reactions, if applicable, have been reviewed and addressed. Yes  The patient's current medication list has been assessed for drug-drug interactions with their  chemotherapy regimen. no significant drug-drug interactions were identified on review.  Order Review:  Are the treatment plan orders signed? Yes  Is the patient scheduled to see a provider prior to their treatment? Yes  I verify that I have reviewed each item in the above checklist and answered each question accordingly.  [place] 03/24/2020 6:54 AM

## 2020-03-24 NOTE — Progress Notes (Signed)
   Subjective: 49 y.o. female presenting today for follow-up treatment and evaluation regarding plantar fasciitis left and peroneal tendinitis right.  Patient states that she is doing much better.  She completed both medications and she is doing more daily activities.  She also states that the plantar fascial braces helped significantly.  Overall she is doing much better with no new complaints at this time  Past Medical History:  Diagnosis Date  . Abdominal pain   . Anemia   . Anxiety   . Bronchitis   . COPD (chronic obstructive pulmonary disease) ([place])   . Depression    bipolar  . Diarrhea   . Emphysema   . GERD (gastroesophageal reflux disease)   . History of blood transfusion   . Splenomegaly    r/t low platelets  . Thrombocytopenia ([place])   . Tobacco abuse      Objective: Physical Exam General: The patient is alert and oriented x3 in no acute distress.  Dermatology: Skin is warm, dry and supple bilateral lower extremities. Negative for open lesions or macerations bilateral.   Vascular: Dorsalis Pedis and Posterior Tibial pulses palpable bilateral.  Capillary fill time is immediate to all digits.  Neurological: Epicritic and protective threshold intact bilateral.   Musculoskeletal: Negative for tenderness to palpation to the plantar aspect of the left heel along the plantar fascia.  Negative for pain with palpation to the insertion of the peroneal tendon of the right foot. All other joints range of motion within normal limits bilateral. Strength 5/5 in all groups bilateral.    Assessment: 1. Plantar fasciitis left foot-resolved 2. Insertional peroneal tendinitis right-resolved  Plan of Care:  1. Patient evaluated.    2.  Continue meloxicam daily as needed 3.  Recommend good supportive tennis shoes daily 4.  Continue plantar fascial brace 5.  Continue wearing soft wide fitting sneakers 6.  Return to clinic as needed  Edrick Kins, DPM [facility]  Dr. Edrick Kins, DPM    [address], [place] [address]                Office [phone]  Fax [phone]

## 2020-03-25 ENCOUNTER — Other Ambulatory Visit: Payer: Self-pay | Admitting: Hematology and Oncology

## 2020-03-25 ENCOUNTER — Other Ambulatory Visit: Payer: Self-pay | Admitting: General Surgery

## 2020-03-25 DIAGNOSIS — E538 Deficiency of other specified B group vitamins: Secondary | ICD-10-CM

## 2020-03-25 MED ORDER — COLESTIPOL HCL 1 G PO TABS: 1.0000 g | ORAL_TABLET | ORAL | 1 refills | Status: DC

## 2020-03-26 ENCOUNTER — Other Ambulatory Visit: Payer: Self-pay

## 2020-03-26 ENCOUNTER — Inpatient Hospital Stay: Payer: Medicaid Other

## 2020-03-26 ENCOUNTER — Inpatient Hospital Stay: Payer: Medicaid Other | Attending: Hematology and Oncology | Admitting: Hematology and Oncology

## 2020-03-26 ENCOUNTER — Encounter: Payer: Self-pay | Admitting: Hematology and Oncology

## 2020-03-26 ENCOUNTER — Other Ambulatory Visit: Payer: Self-pay | Admitting: Hematology and Oncology

## 2020-03-26 VITALS — BP 122/78 | HR 72 | Temp 98.4°F | Resp 20 | Ht 66.0 in | Wt 231.4 lb

## 2020-03-26 DIAGNOSIS — D693 Immune thrombocytopenic purpura: Secondary | ICD-10-CM | POA: Diagnosis present

## 2020-03-26 DIAGNOSIS — R0602 Shortness of breath: Secondary | ICD-10-CM | POA: Insufficient documentation

## 2020-03-26 DIAGNOSIS — D61818 Other pancytopenia: Secondary | ICD-10-CM | POA: Diagnosis not present

## 2020-03-26 DIAGNOSIS — Z79899 Other long term (current) drug therapy: Secondary | ICD-10-CM | POA: Diagnosis not present

## 2020-03-26 DIAGNOSIS — Z9884 Bariatric surgery status: Secondary | ICD-10-CM | POA: Diagnosis not present

## 2020-03-26 DIAGNOSIS — D469 Myelodysplastic syndrome, unspecified: Secondary | ICD-10-CM | POA: Diagnosis not present

## 2020-03-26 LAB — CBC WITH DIFFERENTIAL/PLATELET
Abs Immature Granulocytes: 0.02 10*3/uL (ref 0.00–0.07)
Basophils Absolute: 0.1 10*3/uL (ref 0.0–0.1)
Basophils Relative: 2 %
Eosinophils Absolute: 0.1 10*3/uL (ref 0.0–0.5)
Eosinophils Relative: 1 %
HCT: 42.8 % (ref 36.0–46.0)
Hemoglobin: 14.3 g/dL (ref 12.0–15.0)
Immature Granulocytes: 1 %
Lymphocytes Relative: 19 %
Lymphs Abs: 0.7 10*3/uL (ref 0.7–4.0)
MCH: 30.4 pg (ref 26.0–34.0)
MCHC: 33.4 g/dL (ref 30.0–36.0)
MCV: 90.9 fL (ref 80.0–100.0)
Monocytes Absolute: 0.3 10*3/uL (ref 0.1–1.0)
Monocytes Relative: 7 %
Neutro Abs: 2.5 10*3/uL (ref 1.7–7.7)
Neutrophils Relative %: 70 %
Platelets: 26 10*3/uL — ABNORMAL LOW (ref 150–400)
RBC: 4.71 MIL/uL (ref 3.87–5.11)
RDW: 14.6 % (ref 11.5–15.5)
WBC: 3.6 10*3/uL — ABNORMAL LOW (ref 4.0–10.5)
nRBC: 0.6 % — ABNORMAL HIGH (ref 0.0–0.2)

## 2020-03-26 LAB — COMPREHENSIVE METABOLIC PANEL
ALT: 15 U/L (ref 0–44)
AST: 15 U/L (ref 15–41)
Albumin: 3.9 g/dL (ref 3.5–5.0)
Alkaline Phosphatase: 70 U/L (ref 38–126)
Anion gap: 10 (ref 5–15)
BUN: 13 mg/dL (ref 6–20)
CO2: 25 mmol/L (ref 22–32)
Calcium: 8.8 mg/dL — ABNORMAL LOW (ref 8.9–10.3)
Chloride: 106 mmol/L (ref 98–111)
Creatinine, Ser: 0.73 mg/dL (ref 0.44–1.00)
GFR calc Af Amer: >60 mL/min (ref >60)
GFR calc non Af Amer: >60 mL/min (ref >60)
Glucose, Bld: 70 mg/dL (ref 70–99)
Potassium: 3.9 mmol/L (ref 3.5–5.1)
Sodium: 141 mmol/L (ref 135–145)
Total Bilirubin: 0.7 mg/dL (ref 0.3–1.2)
Total Protein: 6.3 g/dL — ABNORMAL LOW (ref 6.5–8.1)

## 2020-03-26 LAB — HEPATITIS B CORE ANTIBODY, TOTAL: Hep B Core Total Ab: NONREACTIVE

## 2020-03-26 LAB — HEPATITIS B CORE ANTIBODY, IGM: Hep B C IgM: NONREACTIVE

## 2020-03-26 LAB — HEPATITIS B SURFACE ANTIGEN: Hepatitis B Surface Ag: NONREACTIVE

## 2020-03-26 NOTE — Assessment & Plan Note (Signed)
We discussed the risk, benefits, side effects of single agent rituximab to treat ITP She is in agreement to proceed She does not need transfusion support right now but we will be observing her platelet count closely I will see her back in 2 weeks for further follow-up

## 2020-03-26 NOTE — Assessment & Plan Note (Signed)
She has intermittent mild leukopenia Wwe will continue B12 injections Observe only for now, no need treatment for low platelets for now

## 2020-03-26 NOTE — Progress Notes (Signed)
[facility] OFFICE PROGRESS NOTE  Barbara Francois, NP  ASSESSMENT & PLAN:  Chronic ITP (idiopathic thrombocytopenia) (HCC) We discussed the risk, benefits, side effects of single agent rituximab to treat ITP She is in agreement to proceed She does not need transfusion support right now but we will be observing her platelet count closely I will see her back in 2 weeks for further follow-up  Other pancytopenia ([place]) She has intermittent mild leukopenia Wwe will continue B12 injections Observe only for now, no need treatment for low platelets for now   Orders Placed This Encounter  Procedures  . ABO/RH    Standing Status:   Future    Standing Expiration Date:   03/26/2021    The total time spent in the appointment was 20 minutes encounter with patients including review of chart and various tests results, discussions about plan of care and coordination of care plan   All questions were answered. The patient knows to call the clinic with any problems, questions or concerns. No barriers to learning was detected.    Heath Lark, MD 6/10/202110:39 AM  INTERVAL HISTORY: Barbara Stanley 49 y.o. female returns for follow-up on chronic ITP, multifactorial pancytopenia due to multiple nutritional deficiencies secondary to bypass surgery Since last time I saw her, she bruises easily No recent infection, fever or chills The patient denies any recent signs or symptoms of bleeding such as spontaneous epistaxis, hematuria or hematochezia.    SUMMARY OF HEMATOLOGIC HISTORY: Oncology History Overview Note  Cytogenetics showed 2 cell lines.  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  Starting around 2018, she was noted to have progressive mild pancytopenia.  Most  recently, around October 2019, she is noted to have significant pancytopenia. She was referred to see hematologist for evaluation.  The patient has history of gastric bypass surgery.  She has significant recent weight loss. Her primary care doctor has ordered multiple mineral panel.  She was noted to have folate, B6, B12, copper as well as iron deficiency.  She has received multiple mineral replacement therapy.  When she was seen here at the hematology clinic, she received high-dose vitamin B12 injection. On August 09, 2018, she underwent ultrasound of her abdomen which showed no evidence of splenomegaly.  The patient has significant smoking history but denies alcohol intake She denies significant recent infection requiring intravenous antibiotic treatment. Infectious work-up was negative. Between October to December 2019, she has received intravenous iron infusion along with vitamin B12 injection. On October 01, 2018, she underwent bone marrow aspirate and biopsy to evaluate for pancytopenia.  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  She was given vitamin B12 injection, oral copper and oral vitamin D replacement therapy   10/01/2018 Bone Marrow Biopsy   Bone Marrow, Aspirate,Biopsy, and Clot - SLIGHTLY HYPERCELLULAR BONE MARROW FOR AGE WITH DYSPOIETIC CHANGES. - SEE COMMENT. PERIPHERAL BLOOD: - PANCYTOPENIA. Diagnosis Note The bone marrow is slightly hypercellular for age with trilineage hematopoiesis but with relative abundance of erythroid precursors. This is associated with dyspoietic changes variably involving myeloid cell lines but with no increase in blastic cells The differential diagnosis includes secondary changes due to nutritional deficiency, medication, infection, autoimmune disease,  toxins, etc. as well as a primary clonal myelodysplastic process. Correlation with cytogenetic and FISH  studies is recommended. (BNS:kh 10/02/18)      I have reviewed the past medical history, past surgical history, social history and family history with the patient and they are unchanged from previous note.  ALLERGIES:  is allergic to other.  MEDICATIONS:  Current Outpatient Medications  Medication Sig Dispense Refill  . Accu-Chek Softclix Lancets lancets Use as instructed 100 each 12  . acetaminophen (TYLENOL) 500 MG tablet Take 1,000 mg by mouth daily as needed for moderate pain.    [name] albuterol (PROAIR HFA) 108 (90 Base) MCG/ACT inhaler Inhale 2 puffs into the lungs every 6 (six) hours as needed for wheezing or shortness of breath. 17 g 3  . albuterol (PROVENTIL) (2.5 MG/3ML) 0.083% nebulizer solution Take 3 mLs (2.5 mg total) by nebulization every 4 (four) hours as needed for wheezing or shortness of breath. 75 mL 5  . Blood Glucose Monitoring Suppl (ACCU-CHEK AVIVA PLUS) w/Device KIT 1 each by Does not apply route 2 (two) times daily as needed. 1 kit 0  . cholecalciferol (VITAMIN D3) 25 MCG (1000 UT) tablet Take 5,000 Units by mouth daily.    . colestipol (COLESTID) 1 g tablet Take 1 tablet (1 g total) by mouth as directed. 3 tablets every am and 2 tablets every pm 150 tablet 1  . Copper Gluconate 2 MG CAPS Take 4 mg by mouth daily.    . Cyanocobalamin (VITAMIN B-12 IJ) Inject 1 mg as directed as directed. Every 3 months    . diclofenac sodium (VOLTAREN) 1 % GEL Apply 2 g topically 4 (four) times daily. 2 g 3  . dicyclomine (BENTYL) 20 MG tablet Take 1 tablet (20 mg total) by mouth as needed for spasms. 120 tablet 1  . esomeprazole (NEXIUM) 40 MG capsule TAKE 1 CAPSULE(40 MG) BY MOUTH TWICE DAILY BEFORE A MEAL 60 capsule 3  . famotidine (PEPCID) 40 MG tablet Take 1 tablet (40 mg total) by mouth 2 (two) times daily. 60 tablet 3  . Fluticasone-Umeclidin-Vilant (TRELEGY ELLIPTA) 100-62.5-25 MCG/INH AEPB Inhale 1 puff into the lungs daily. 60 each 1  . folic acid (FOLVITE) 1 MG tablet TAKE 1  TABLET(1 MG) BY MOUTH DAILY 90 tablet 2  . glucose blood (ACCU-CHEK AVIVA PLUS) test strip Use as instructed 100 each 12  . meloxicam (MOBIC) 15 MG tablet Take 1 tablet (15 mg total) by mouth daily. 30 tablet 1  . mirabegron ER (MYRBETRIQ) 25 MG TB24 tablet Take 1 tablet (25 mg total) by mouth daily. 30 tablet 5  . solifenacin (VESICARE) 10 MG tablet Take 1 tablet (10 mg total) by mouth daily. 30 tablet 5  . traZODone (DESYREL) 150 MG tablet Take 1 tablet (150 mg total) by mouth at bedtime. For sleep (Patient taking differently: Take 300 mg by mouth at bedtime. For sleep) 30 tablet 0  . venlafaxine XR (EFFEXOR-XR) 75 MG 24 hr capsule Take 1 capsule (75 mg total) by mouth daily with breakfast. For depression 30 capsule 0   No current facility-administered medications for this visit.     REVIEW OF SYSTEMS:   Constitutional: Denies fevers, chills or night sweats Eyes: Denies blurriness of vision Ears, nose, mouth, throat, and face: Denies mucositis or sore throat Respiratory: Denies cough, dyspnea or wheezes Cardiovascular: Denies palpitation, chest discomfort or lower extremity swelling Gastrointestinal:  Denies nausea, heartburn or change in bowel habits Skin: Denies abnormal skin rashes Lymphatics: Denies new  lymphadenopathy Neurological:Denies numbness, tingling or new weaknesses Behavioral/Psych: Mood is stable, no new changes  All other systems were reviewed with the patient and are negative.  PHYSICAL EXAMINATION: ECOG PERFORMANCE STATUS: 1 - Symptomatic but completely ambulatory  Vitals:   03/26/20 0817  BP: 122/78  Pulse: 72  Resp: 20  Temp: 98.4 F (36.9 C)  SpO2: 94%   Filed Weights   03/26/20 0817  Weight: 231 lb 6.4 oz (105 kg)    GENERAL:alert, no distress and comfortable SKIN: skin color, texture, turgor are normal, no rashes or significant lesions EYES: normal, Conjunctiva are pink and non-injected, sclera clear OROPHARYNX:no exudate, no erythema and lips,  buccal mucosa, and tongue normal  NECK: supple, thyroid normal size, non-tender, without nodularity LYMPH:  no palpable lymphadenopathy in the cervical, axillary or inguinal LUNGS: clear to auscultation and percussion with normal breathing effort HEART: regular rate & rhythm and no murmurs and no lower extremity edema ABDOMEN:abdomen soft, non-tender and normal bowel sounds Musculoskeletal:no cyanosis of digits and no clubbing  NEURO: alert & oriented x 3 with fluent speech, no focal motor/sensory deficits  LABORATORY DATA:  I have reviewed the data as listed     Component Value Date/Time   NA 141 03/26/2020 0756   NA 140 07/04/2019 1110   K 3.9 03/26/2020 0756   CL 106 03/26/2020 0756   CO2 25 03/26/2020 0756   GLUCOSE 70 03/26/2020 0756   BUN 13 03/26/2020 0756   BUN 13 07/04/2019 1110   CREATININE 0.73 03/26/2020 0756   CREATININE 0.78 03/05/2020 1039   CREATININE 0.59 08/07/2017 1410   CALCIUM 8.8 (L) 03/26/2020 0756   PROT 6.3 (L) 03/26/2020 0756   PROT 6.2 07/04/2019 1110   ALBUMIN 3.9 03/26/2020 0756   ALBUMIN 4.5 07/04/2019 1110   AST 15 03/26/2020 0756   ALT 15 03/26/2020 0756   ALKPHOS 70 03/26/2020 0756   BILITOT 0.7 03/26/2020 0756   BILITOT 0.4 07/04/2019 1110   GFRNONAA >60 03/26/2020 0756   GFRNONAA >60 03/05/2020 1039   GFRNONAA 110 08/07/2017 1410   GFRAA >60 03/26/2020 0756   GFRAA >60 03/05/2020 1039   GFRAA 127 08/07/2017 1410    No results found for: SPEP, UPEP  Lab Results  Component Value Date   WBC 3.6 (L) 03/26/2020   NEUTROABS 2.5 03/26/2020   HGB 14.3 03/26/2020   HCT 42.8 03/26/2020   MCV 90.9 03/26/2020   PLT 26 (L) 03/26/2020      Chemistry      Component Value Date/Time   NA 141 03/26/2020 0756   NA 140 07/04/2019 1110   K 3.9 03/26/2020 0756   CL 106 03/26/2020 0756   CO2 25 03/26/2020 0756   BUN 13 03/26/2020 0756   BUN 13 07/04/2019 1110   CREATININE 0.73 03/26/2020 0756   CREATININE 0.78 03/05/2020 1039   CREATININE  0.59 08/07/2017 1410      Component Value Date/Time   CALCIUM 8.8 (L) 03/26/2020 0756   ALKPHOS 70 03/26/2020 0756   AST 15 03/26/2020 0756   ALT 15 03/26/2020 0756   BILITOT 0.7 03/26/2020 0756   BILITOT 0.4 07/04/2019 1110

## 2020-03-27 ENCOUNTER — Inpatient Hospital Stay: Payer: Medicaid Other

## 2020-03-27 ENCOUNTER — Other Ambulatory Visit: Payer: Self-pay | Admitting: Hematology and Oncology

## 2020-03-27 ENCOUNTER — Other Ambulatory Visit: Payer: Self-pay | Admitting: *Deleted

## 2020-03-27 ENCOUNTER — Other Ambulatory Visit: Payer: Self-pay | Admitting: Medical

## 2020-03-27 ENCOUNTER — Other Ambulatory Visit: Payer: Self-pay

## 2020-03-27 ENCOUNTER — Inpatient Hospital Stay (HOSPITAL_BASED_OUTPATIENT_CLINIC_OR_DEPARTMENT_OTHER): Payer: Medicaid Other | Admitting: Medical

## 2020-03-27 VITALS — BP 108/61 | HR 61 | Temp 98.3°F | Resp 18

## 2020-03-27 DIAGNOSIS — T8090XA Unspecified complication following infusion and therapeutic injection, initial encounter: Secondary | ICD-10-CM | POA: Diagnosis not present

## 2020-03-27 DIAGNOSIS — R0602 Shortness of breath: Secondary | ICD-10-CM | POA: Diagnosis not present

## 2020-03-27 DIAGNOSIS — D693 Immune thrombocytopenic purpura: Secondary | ICD-10-CM

## 2020-03-27 DIAGNOSIS — R110 Nausea: Secondary | ICD-10-CM | POA: Diagnosis not present

## 2020-03-27 MED ORDER — ACETAMINOPHEN 325 MG PO TABS
ORAL_TABLET | ORAL | Status: AC
  Filled 2020-03-27: qty 2

## 2020-03-27 MED ORDER — SODIUM CHLORIDE 0.9 % IV SOLN
375.0000 mg/m2 | Freq: Once | INTRAVENOUS | Status: AC
  Administered 2020-03-27: 800 mg via INTRAVENOUS
  Filled 2020-03-27: qty 50

## 2020-03-27 MED ORDER — DIPHENHYDRAMINE HCL 25 MG PO CAPS
ORAL_CAPSULE | ORAL | Status: AC
  Filled 2020-03-27: qty 2

## 2020-03-27 MED ORDER — LORAZEPAM 2 MG/ML IJ SOLN
INTRAMUSCULAR | Status: AC
  Filled 2020-03-27: qty 1

## 2020-03-27 MED ORDER — DIPHENHYDRAMINE HCL 25 MG PO CAPS
50.0000 mg | ORAL_CAPSULE | Freq: Once | ORAL | Status: AC
  Administered 2020-03-27: 50 mg via ORAL

## 2020-03-27 MED ORDER — SODIUM CHLORIDE 0.9 % IV SOLN
10.0000 mg | Freq: Once | INTRAVENOUS | Status: AC
  Administered 2020-03-27: 10 mg via INTRAVENOUS
  Filled 2020-03-27: qty 10

## 2020-03-27 MED ORDER — ACETAMINOPHEN 325 MG PO TABS
650.0000 mg | ORAL_TABLET | Freq: Once | ORAL | Status: AC
  Administered 2020-03-27: 325 mg via ORAL

## 2020-03-27 MED ORDER — ONDANSETRON HCL 4 MG/2ML IJ SOLN
8.0000 mg | Freq: Once | INTRAMUSCULAR | Status: AC
  Administered 2020-03-27: 8 mg via INTRAVENOUS

## 2020-03-27 MED ORDER — LORAZEPAM 2 MG/ML IJ SOLN
0.5000 mg | Freq: Once | INTRAMUSCULAR | Status: AC
  Administered 2020-03-27: 0.5 mg via INTRAVENOUS

## 2020-03-27 MED ORDER — SODIUM CHLORIDE 0.9 % IV SOLN
Freq: Once | INTRAVENOUS | Status: AC
  Filled 2020-03-27: qty 250

## 2020-03-27 MED ORDER — ALBUTEROL SULFATE HFA 108 (90 BASE) MCG/ACT IN AERS
2.0000 | INHALATION_SPRAY | Freq: Once | RESPIRATORY_TRACT | Status: AC
  Administered 2020-03-27: 2 via RESPIRATORY_TRACT

## 2020-03-27 MED ORDER — ONDANSETRON HCL 4 MG/2ML IJ SOLN
INTRAMUSCULAR | Status: AC
  Filled 2020-03-27: qty 4

## 2020-03-27 NOTE — Progress Notes (Signed)
Patient began experiencing nausea following second increase (100mg /hr) of rituximab. Medication paused and Sandi Mealy, PA-C called. Received orders for zofran 8 mg. Following administration of medication, patient continued to experiencing increasingly worse nausea. Sandi Mealy notified and received orders for ativan 0.5 mg. Patient reported relief from nausea, but experienced increasing shortness of breath. Patient vitals assessed - noted drop in O2 sats. Patient placed on 2L O2 via nasal cannula. Sats improved, however patient continued to complain of shortness of breath and that the "air felt thick". Sandi Mealy called and came to bedside to assess patient. Received orders for albuterol inhaler. Following administration of inhaler, pt reported resolution in symptoms.  Patient placed back on room air - sats stable.  Patient restarted on infusion at 50 mg/hr.  Patient tolerating subsequent titrations well with no further complaints.

## 2020-03-27 NOTE — Progress Notes (Signed)
    DATE:  03/27/2020                                          X  CHEMO/IMMUNOTHERAPY REACTION            MD:  Dr. Heath Lark   AGENT/BLOOD PRODUCT RECEIVING TODAY:               Ruxience     AGENT/BLOOD PRODUCT RECEIVING IMMEDIATELY PRIOR TO REACTION:           Ruxience 100 mg/h   VS: BP:      136/80   P:        65       SPO2:        91% on room air                  REACTION(S):            Nausea and shortness of breath   PREMEDS:      Tylenol, Benadryl, and dexamethasone   INTERVENTION: The patient was initially given Zofran 8 mg IV x1.  She continued to have nausea and was given Ativan 0.5 mg IV x1.  Ruxience was paused and the patient was given an albuterol inhaler.   Review of Systems  Review of Systems  Constitutional: Negative for chills, diaphoresis and fever.  HENT: Negative for trouble swallowing and voice change.   Respiratory: Positive for shortness of breath. Negative for cough, chest tightness and wheezing.   Cardiovascular: Negative for chest pain and palpitations.  Gastrointestinal: Positive for nausea. Negative for abdominal pain, constipation, diarrhea and vomiting.  Musculoskeletal: Negative for back pain and myalgias.  Neurological: Negative for dizziness, light-headedness and headaches.     Physical Exam  Physical Exam Constitutional:      General: She is not in acute distress.    Appearance: She is not diaphoretic.  HENT:     Head: Normocephalic and atraumatic.  Eyes:     General: No scleral icterus.       Right eye: No discharge.        Left eye: No discharge.     Conjunctiva/sclera: Conjunctivae normal.  Cardiovascular:     Rate and Rhythm: Normal rate and regular rhythm.     Heart sounds: Normal heart sounds. No murmur heard.  No friction rub. No gallop.   Pulmonary:     Effort: Pulmonary effort is normal. No respiratory distress.     Breath sounds: Wheezing (Right upper lobe) present. No rales.  Skin:    General: Skin is warm and dry.      Findings: No erythema or rash.  Neurological:     Mental Status: She is alert.  Psychiatric:        Mood and Affect: Mood normal.        Behavior: Behavior normal.        Thought Content: Thought content normal.        Judgment: Judgment normal.     OUTCOME:                 The patient's symptoms resolved after the above intervention.  Ruxience was restarted at 50 mg/h with the patient completing her therapy without additional issues of concern.   Sandi Mealy, MHS, PA-C

## 2020-03-27 NOTE — Patient Instructions (Addendum)
[facility] Discharge Instructions for Patients Receiving Chemotherapy  Today you received the following chemotherapy agents: Rituxumab   To help prevent nausea and vomiting after your treatment, we encourage you to take your nausea medication as prescribed.   If you develop nausea and vomiting that is not controlled by your nausea medication, call the clinic.   BELOW ARE SYMPTOMS THAT SHOULD BE REPORTED IMMEDIATELY:  *FEVER GREATER THAN 100.5 F  *CHILLS WITH OR WITHOUT FEVER  NAUSEA AND VOMITING THAT IS NOT CONTROLLED WITH YOUR NAUSEA MEDICATION  *UNUSUAL SHORTNESS OF BREATH  *UNUSUAL BRUISING OR BLEEDING  TENDERNESS IN MOUTH AND THROAT WITH OR WITHOUT PRESENCE OF ULCERS  *URINARY PROBLEMS  *BOWEL PROBLEMS  UNUSUAL RASH Items with * indicate a potential emergency and should be followed up as soon as possible.  Feel free to call the clinic should you have any questions or concerns. The clinic phone number is [phone].  Please show the [facility] at check-in to the Emergency Department and triage nurse.  Rituximab injection What is this medicine? RITUXIMAB (ri TUX i mab) is a monoclonal antibody. It is used to treat certain types of cancer like non-Hodgkin lymphoma and chronic lymphocytic leukemia. It is also used to treat rheumatoid arthritis, granulomatosis with polyangiitis (or Wegener's granulomatosis), microscopic polyangiitis, and pemphigus vulgaris. This medicine may be used for other purposes; ask your health care provider or pharmacist if you have questions. COMMON BRAND NAME(S): Rituxan, RUXIENCE What should I tell my health care provider before I take this medicine? They need to know if you have any of these conditions:  heart disease  infection (especially a virus infection such as hepatitis B, chickenpox, cold sores, or herpes)  immune system problems  irregular heartbeat  kidney disease  low blood counts, like low white cell,  platelet, or red cell counts  lung or breathing disease, like asthma  recently received or scheduled to receive a vaccine  an unusual or allergic reaction to rituximab, other medicines, foods, dyes, or preservatives  pregnant or trying to get pregnant  breast-feeding How should I use this medicine? This medicine is for infusion into a vein. It is administered in a hospital or clinic by a specially trained health care professional. A special MedGuide will be given to you by the pharmacist with each prescription and refill. Be sure to read this information carefully each time. Talk to your pediatrician regarding the use of this medicine in children. This medicine is not approved for use in children. Overdosage: If you think you have taken too much of this medicine contact a poison control center or emergency room at once. NOTE: This medicine is only for you. Do not share this medicine with others. What if I miss a dose? It is important not to miss a dose. Call your doctor or health care professional if you are unable to keep an appointment. What may interact with this medicine?  cisplatin  live virus vaccines This list may not describe all possible interactions. Give your health care provider a list of all the medicines, herbs, non-prescription drugs, or dietary supplements you use. Also tell them if you smoke, drink alcohol, or use illegal drugs. Some items may interact with your medicine. What should I watch for while using this medicine? Your condition will be monitored carefully while you are receiving this medicine. You may need blood work done while you are taking this medicine. This medicine can cause serious allergic reactions. To reduce your risk you  may need to take medicine before treatment with this medicine. Take your medicine as directed. In some patients, this medicine may cause a serious brain infection that may cause death. If you have any problems seeing, thinking,  speaking, walking, or standing, tell your healthcare professional right away. If you cannot reach your healthcare professional, urgently seek other source of medical care. Call your doctor or health care professional for advice if you get a fever, chills or sore throat, or other symptoms of a cold or flu. Do not treat yourself. This drug decreases your body's ability to fight infections. Try to avoid being around people who are sick. Do not become pregnant while taking this medicine or for at least 12 months after stopping it. Women should inform their doctor if they wish to become pregnant or think they might be pregnant. There is a potential for serious side effects to an unborn child. Talk to your health care professional or pharmacist for more information. Do not breast-feed an infant while taking this medicine or for at least 6 months after stopping it. What side effects may I notice from receiving this medicine? Side effects that you should report to your doctor or health care professional as soon as possible:  allergic reactions like skin rash, itching or hives; swelling of the face, lips, or tongue  breathing problems  chest pain  changes in vision  diarrhea  headache with fever, neck stiffness, sensitivity to light, nausea, or confusion  fast, irregular heartbeat  loss of memory  low blood counts - this medicine may decrease the number of white blood cells, red blood cells and platelets. You may be at increased risk for infections and bleeding.  mouth sores  problems with balance, talking, or walking  redness, blistering, peeling or loosening of the skin, including inside the mouth  signs of infection - fever or chills, cough, sore throat, pain or difficulty passing urine  signs and symptoms of kidney injury like trouble passing urine or change in the amount of urine  signs and symptoms of liver injury like dark yellow or brown urine; general ill feeling or flu-like  symptoms; light-colored stools; loss of appetite; nausea; right upper belly pain; unusually weak or tired; yellowing of the eyes or skin  signs and symptoms of low blood pressure like dizziness; feeling faint or lightheaded, falls; unusually weak or tired  stomach pain  swelling of the ankles, feet, hands  unusual bleeding or bruising  vomiting Side effects that usually do not require medical attention (report to your doctor or health care professional if they continue or are bothersome):  headache  joint pain  muscle cramps or muscle pain  nausea  tiredness This list may not describe all possible side effects. Call your doctor for medical advice about side effects. You may report side effects to FDA at 1-800-FDA-1088. Where should I keep my medicine? This drug is given in a hospital or clinic and will not be stored at home. NOTE: This sheet is a summary. It may not cover all possible information. If you have questions about this medicine, talk to your doctor, pharmacist, or health care provider.  2020 Elsevier/Gold Standard (2018-11-14 22:01:36)

## 2020-03-30 ENCOUNTER — Telehealth: Payer: Self-pay

## 2020-03-30 NOTE — Telephone Encounter (Signed)
-----   Message from Jolaine Click, RN sent at 03/27/2020 12:29 PM EDT ----- Regarding: Dr. Alvy Bimler - 1st Treatment Follow-Up First time Rituximab follow-up

## 2020-03-30 NOTE — Telephone Encounter (Signed)
Called and left a message asking her to call the back if she has questions or concerns. This is a follow up call after treatment on Friday.

## 2020-04-01 ENCOUNTER — Encounter: Payer: Self-pay | Admitting: Nurse Practitioner

## 2020-04-01 ENCOUNTER — Encounter: Payer: Self-pay | Admitting: Gastroenterology

## 2020-04-01 ENCOUNTER — Ambulatory Visit: Payer: Medicaid Other | Admitting: Gastroenterology

## 2020-04-01 ENCOUNTER — Ambulatory Visit (INDEPENDENT_AMBULATORY_CARE_PROVIDER_SITE_OTHER): Payer: Medicaid Other | Admitting: Nurse Practitioner

## 2020-04-01 ENCOUNTER — Other Ambulatory Visit: Payer: Self-pay

## 2020-04-01 VITALS — BP 115/70 | HR 71 | Temp 98.1°F | Wt 238.0 lb

## 2020-04-01 VITALS — BP 80/50 | HR 72 | Ht 66.25 in | Wt 237.5 lb

## 2020-04-01 DIAGNOSIS — M545 Low back pain, unspecified: Secondary | ICD-10-CM

## 2020-04-01 DIAGNOSIS — E162 Hypoglycemia, unspecified: Secondary | ICD-10-CM | POA: Diagnosis not present

## 2020-04-01 DIAGNOSIS — K219 Gastro-esophageal reflux disease without esophagitis: Secondary | ICD-10-CM

## 2020-04-01 DIAGNOSIS — K8689 Other specified diseases of pancreas: Secondary | ICD-10-CM | POA: Diagnosis not present

## 2020-04-01 DIAGNOSIS — J449 Chronic obstructive pulmonary disease, unspecified: Secondary | ICD-10-CM

## 2020-04-01 DIAGNOSIS — R232 Flushing: Secondary | ICD-10-CM | POA: Diagnosis not present

## 2020-04-01 DIAGNOSIS — F332 Major depressive disorder, recurrent severe without psychotic features: Secondary | ICD-10-CM

## 2020-04-01 DIAGNOSIS — R197 Diarrhea, unspecified: Secondary | ICD-10-CM

## 2020-04-01 DIAGNOSIS — E66812 Obesity, class 2: Secondary | ICD-10-CM

## 2020-04-01 DIAGNOSIS — G8929 Other chronic pain: Secondary | ICD-10-CM

## 2020-04-01 DIAGNOSIS — Z6838 Body mass index (BMI) 38.0-38.9, adult: Secondary | ICD-10-CM

## 2020-04-01 DIAGNOSIS — E6609 Other obesity due to excess calories: Secondary | ICD-10-CM

## 2020-04-01 DIAGNOSIS — M25511 Pain in right shoulder: Secondary | ICD-10-CM

## 2020-04-01 LAB — POCT URINALYSIS DIPSTICK
Blood, UA: NEGATIVE
Glucose, UA: NEGATIVE
Ketones, UA: NEGATIVE
Leukocytes, UA: NEGATIVE
Nitrite, UA: NEGATIVE
Protein, UA: POSITIVE — AB
Spec Grav, UA: 1.02 (ref 1.010–1.025)
Urobilinogen, UA: 1 E.U./dL
pH, UA: 7.5 (ref 5.0–8.0)

## 2020-04-01 LAB — POCT GLYCOSYLATED HEMOGLOBIN (HGB A1C)
HbA1c POC (<> result, manual entry): 5.9 % (ref 4.0–5.6)
HbA1c, POC (controlled diabetic range): 5.9 % (ref 0.0–7.0)
HbA1c, POC (prediabetic range): 5.9 % (ref 5.7–6.4)
Hemoglobin A1C: 5.9 % — AB (ref 4.0–5.6)

## 2020-04-01 NOTE — Progress Notes (Signed)
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 No history of acute or chronic pancreatitis. No celiac on duodenal biopsies. May be overlap of symptoms with SIBO.  Diarrhea has largely improved on colestipol 3g QAM and 2gQPM with daily probiotics. Will consider treating with antibiotics for possible SIBO and/or add pancreatic enzymes if not responding to antibiotics.   Reflux: Controlled on Nexium 40 mg BID and famotidine 20 mg BID, although she prefers to dose them on a PRN basis.  Colon cancer screening: She will be due surveillance colonoscopy in 2030.     PLAN: Continue Nexium 40 mg BID and famotodine 20 mg BID Continue daily Benefiber Continue daily probiotics Continue dicyclomine 20 mg QID  Continue colestipol 3g QAM and 2g QPM Low threshold to start pancreatic enzymes Smoking cessation Colonoscopy 2030 Follow-up in 3-4, earlier if needed   HPI: Barbara Stanley is a 49 y.o. female  who returns in scheduled  follow-up for evaluation and treatment of diarrhea.  The interval history is obtained through the patient and review of her electronic health record. Gastric bypass performed 16 years ago [place]. Not on any long term supplements until she was hospitalized last year for diarrhea-induced hypokalemia and pancytopenia secondary to undernutrition and vitamin deficiencies with b12, folate, and iron. She has chronic obstructive pulmonary disease; major depression; unilateral right hearing deficit; chronic anemia; gastroesophageal reflux disease; active tobacco dependence; degenerative joint disease involving the left shoulder, lower back, and knees bilaterally.  Initially seen for loose BM occuring 3-4 times in the morning. At it's worst, she was having 20 bowel movements daily. Baseline bowel habits following the bypass are diarrhea - but with 4 slightly formed BM daily. Was also having diffuse and upper abdominal abdominal pain that occurs immediately after eating or while eating despite Nexium QAM and famotidine QPM. Associated nausea. Occassional postprandial bowel movement that improves the pain.   An upper GI series 07/12/2019 showed moderate gastroesophageal reflux, small hiatal hernia and inability to completely assess for esophageal motility given the patient's nausea during the exam.  However there was no significant delay in contrast passage through the esophagus.  Endoscopic evaluation 09/26/2019.  Upper endoscopy revealed a normal esophagus, small hiatal hernia, evidence of gastric bypass with a small pouch that appeared normal and a normal appearing anastomosis. Duodenal biopsies were normal.  A colonoscopy performed at the tame time was normal.  Random colon biopsies from the right and left colon for microscopic colitis were normal.  At the time of her office visit 10/28/19 she was having rare heartburn minimal  nausea and significant improvement in her abdominal pain. Still occuring after eating but  not every day. Pain and nausea resolve 15 minutes after defecation.  Symptoms had improved on colestipol 2 mg BID with two, soft formed stools daily. Her appetite increased and she had gained 3 pounds.  She continues to be followed closely by Alvy Bimler. B12 and copper levels have been restored on replacement therapy. Iron infusion last month after poorly tolerating oral iron supplements. Dr. Calton Dach note from office visit 11/13/19 documents complete normalization of hemoglobin and WBC. Thrombocytopenia persists although it's improved.   She called the office in mid-February reporting 3-5 BM daily. One day she felt the colestipol was no longer working. Will have a day of normal stools followed by a day of profound diarrhea.  Has a severe, upper abdominal cramping that precedes BM and resolves with defecation.  Colestipol increased to 3g QAM and 2 g QPM. She returns today in follow-up.  Despite the increase, she still has frequent BM although they are more formed.  Overall her abdominal symptoms have improved. Appetite is good. She is gaining weight. Rarely using Bentyl.   At the time of office follow-up 12/25/19 she was taking colestipol 2 mg BID with two soft, formed worms daily. Abdominal pain has resolved.Still having diarrhea. She was continued on colestipol 3g QAM and 2g QPM and dicyclomine. Benefiber, probiotics recommended. Follow-up stool studies with fecal elastase suggested PEI.   On follow-up 02/11/20 she reported feeling better. Could not afford Align. Was using Activa yogurt with improvement. Stools were more formed having 1-2 BM most bowel movements. Symptoms are largely postprandial. Stools are floating. Associated gas and bloating. No associated malodor. Weight is stable.  Returns in scheduled follow-up today. Having 1-2 soft, formed BM daily after completing 2 weeks of antibiotics for SIBO. Continues on daily colestipol, Benefiber, and probiotics.  Rare hard pellet BM followed by mild  diarrhea.  Rare abdominal pain with eating. Weight fluctuates. Appetite is okay.  Was temporarily using meloxicam for joints, but, is no longer using.  No new complaints or concerns.   She has started Ritubimab and has been warned her that she could have worsened GI symptoms.   Recent labs: The results of the C. difficile quick screen with PCR reflex was negative from October 31.  Qualitative fecal lactoferrin was positive.  A stool culture for E. coli; Shigella toxin; Salmonella and Shigella were not identified. Campylobacter culture was negative. Empirically she was given a brief course of metronidazole.   Labs 09/06/18: fecal calprotectin 32 Labs 06/07/19: WBC 5.7, hgb 13.6, platelets 49 Labs 07/04/19: normal CMP Labs 07/19/19: normal copper, Vit D, B12; normal CBC except for platelets of 38 Labs 08/25/2019: normal comprehensive metabolic panel except for a calcium of 8.3 and a total protein of 6.  White count 5.3, hemoglobin 12.3, MCV 90, RDW 14.1, platelets 46. Labs 11/07/19: iron 27, ferritin 5, saturation 550 Labs 12/25/19: normal BMP including potasium of 4.0, crt 0.67 Labs 12/31/19: pancreatic elastase 114  Prior endoscopy:  EGD for reflux several years ago "somewhere in [place]."  EGD 09/26/2019: normal esophagus, small hiatal hernia, evidence of gastric bypass with a small pouch normal-appearing anastomosis, and normal small bowel.  Duodenal biopsies were normal. Colonoscopy 09/26/2019: normal.  Random colon biopsies for microscopic colitis were normal.   Abdominal imaging: Abdominal ultrasound for abnormal liver enzymes 08/09/18:  common bile duct of 6.6 mm status post cholecystectomy.  Spleen is upper limit of normal at 12.6 cm. Transaminases at that  time were elevated. They have since been repeatedly normal on several rechecks.  UGI series 07/12/19: Moderate gastroesophageal reflux. Small hiatal hernia. Esophageal motility could not be completely assessed as the patient became nauseated  during the exam and could not perform this portion. However throughout the exam no significant delay in contrast passage through the esophagus was observed. CT abd/pelvis with contrast 09/04/19: Unremarkable pancreas. No ductal dilatation or surrounding inflammatory changes. Splenomegaly. Prior gastric bypass. Multiple ventral well abdominal hernias that contain fat. Thickened appendix with diffuse intramural fatty deposition. No stranding or free fluid.   Past Medical History:  Diagnosis Date  . Abdominal pain   . Anemia   . Anxiety   . Bronchitis   . COPD (chronic obstructive pulmonary disease) ([place])   . Depression    bipolar  . Diarrhea   . Emphysema   . GERD (gastroesophageal reflux disease)   . History of blood transfusion   . Splenomegaly    r/t low platelets  . Thrombocytopenia ([place])   . Tobacco abuse     Past Surgical History:  Procedure Laterality Date  . ABDOMINAL HYSTERECTOMY    . c section x 3     . CHOLECYSTECTOMY    . GASTRIC BYPASS    . UPPER GASTROINTESTINAL ENDOSCOPY    . WISDOM TOOTH EXTRACTION      Current Outpatient Medications  Medication Sig Dispense Refill  . Accu-Chek Softclix Lancets lancets Use as instructed 100 each 12  . acetaminophen (TYLENOL) 500 MG tablet Take 1,000 mg by mouth daily as needed for moderate pain.    [name] albuterol (PROAIR HFA) 108 (90 Base) MCG/ACT inhaler Inhale 2 puffs into the lungs every 6 (six) hours as needed for wheezing or shortness of breath. 17 g 3  . albuterol (PROVENTIL) (2.5 MG/3ML) 0.083% nebulizer solution Take 3 mLs (2.5 mg total) by nebulization every 4 (four) hours as needed for wheezing or shortness of breath. 75 mL 5  . Blood Glucose Monitoring Suppl (ACCU-CHEK AVIVA PLUS) w/Device KIT 1 each by Does not apply route 2 (two) times daily as needed. 1 kit 0  . cholecalciferol (VITAMIN D3) 25 MCG (1000 UT) tablet Take 5,000 Units by mouth daily.    . colestipol (COLESTID) 1 g tablet Take 1 tablet (1 g total) by mouth  as directed. 3 tablets every am and 2 tablets every pm 150 tablet 1  . Copper Gluconate 2 MG CAPS Take 4 mg by mouth daily.    . Cyanocobalamin (VITAMIN B-12 IJ) Inject 1 mg as directed as directed. Every 3 months    . diclofenac sodium (VOLTAREN) 1 % GEL Apply 2 g topically 4 (four) times daily. 2 g 3  . dicyclomine (BENTYL) 20 MG tablet Take 1 tablet (20 mg total) by mouth as needed for spasms. 120 tablet 1  . esomeprazole (NEXIUM) 40 MG capsule TAKE 1 CAPSULE(40 MG) BY MOUTH TWICE DAILY BEFORE A MEAL 60 capsule 3  . famotidine (PEPCID) 40 MG tablet Take 1 tablet (40 mg total) by mouth 2 (two) times daily. 60 tablet 3  . Fluticasone-Umeclidin-Vilant (TRELEGY ELLIPTA) 100-62.5-25 MCG/INH AEPB Inhale 1 puff into the lungs daily. 60 each 1  . folic acid (FOLVITE) 1 MG tablet TAKE 1 TABLET(1 MG) BY MOUTH DAILY 90 tablet 2  . glucose blood (ACCU-CHEK AVIVA PLUS) test strip Use as instructed 100 each 12  . meloxicam (MOBIC) 15 MG tablet Take 1 tablet (15 mg total) by mouth daily. 30 tablet 1  . mirabegron  ER (MYRBETRIQ) 25 MG TB24 tablet Take 1 tablet (25 mg total) by mouth daily. 30 tablet 5  . riTUXimab-pvvr (RUXIENCE) 100 MG/10ML injection Inject into the vein as directed. Infusion     . solifenacin (VESICARE) 10 MG tablet Take 1 tablet (10 mg total) by mouth daily. 30 tablet 5  . traZODone (DESYREL) 150 MG tablet Take 1 tablet (150 mg total) by mouth at bedtime. For sleep (Patient taking differently: Take 300 mg by mouth at bedtime. For sleep) 30 tablet 0  . venlafaxine XR (EFFEXOR-XR) 75 MG 24 hr capsule Take 1 capsule (75 mg total) by mouth daily with breakfast. For depression 30 capsule 0   No current facility-administered medications for this visit.    Allergies as of 04/01/2020 - Review Complete 03/27/2020  Allergen Reaction Noted  . Other  08/08/2018    Family History  Problem Relation Age of Onset  . Diabetes Mother   . Diabetes Other   . Hypertension Other   . CAD Other   .  Heart disease Father   . Colon cancer Neg Hx   . Rectal cancer Neg Hx   . Stomach cancer Neg Hx   . Esophageal cancer Neg Hx     Social History   Socioeconomic History  . Marital status: Married    Spouse name: Not on file  . Number of children: 3  . Years of education: Not on file  . Highest education level: Not on file  Occupational History  . Occupation: disabled  Tobacco Use  . Smoking status: Current Every Day Smoker    Packs/day: 1.00    Years: 30.00    Pack years: 30.00    Types: Cigarettes  . Smokeless tobacco: Never Used  . Tobacco comment: tobacco info given 10/28/2019  Vaping Use  . Vaping Use: Never used  Substance and Sexual Activity  . Alcohol use: Yes    Comment: occasional wine  . Drug use: No  . Sexual activity: Yes    Birth control/protection: None    Comment: Hysterectomy  Other Topics Concern  . Not on file  Social History Narrative  . Not on file   Social Determinants of Health   Financial Resource Strain:   . Difficulty of Paying Living Expenses:   Food Insecurity:   . Worried About Charity fundraiser in the Last Year:   . Arboriculturist in the Last Year:   Transportation Needs:   . Film/video editor (Medical):   [name] Lack of Transportation (Non-Medical):   Physical Activity:   . Days of Exercise per Week:   . Minutes of Exercise per Session:   Stress:   . Feeling of Stress :   Social Connections:   . Frequency of Communication with Friends and Family:   . Frequency of Social Gatherings with Friends and Family:   . Attends Religious Services:   . Active Member of Clubs or Organizations:   . Attends Archivist Meetings:   [name] Marital Status:   Intimate Partner Violence:   . Fear of Current or Ex-Partner:   . Emotionally Abused:   [name] Physically Abused:   . Sexually Abused:      Physical Exam: Vital signs were reviewed. General:   Alert, well-nourished, pleasant and cooperative in NAD Abdomen:  Soft, nontender,  central obesity, normal bowel sounds. No rebound or guarding. No hepatosplenomegaly. Well healed midline surgical scar.  Neurologic:  Alert and  oriented x4;  grossly nonfocal Skin:  No rash or bruise. Palmar erythema present. No spider angioma.  Psych:  Alert and cooperative. Normal mood and affect.   Chalise Pe L. Tarri Glenn Md, MPH [facility] 04/01/2020, 8:33 AM

## 2020-04-01 NOTE — Patient Instructions (Addendum)
Menopause Menopause is the normal time of life when menstrual periods stop completely. It is usually confirmed by 12 months without a menstrual period. The transition to menopause (perimenopause) most often happens between the ages of 45 and 55. During perimenopause, hormone levels change in your body, which can cause symptoms and affect your health. Menopause may increase your risk for:  Loss of bone (osteoporosis), which causes bone breaks (fractures).  Depression.  Hardening and narrowing of the arteries (atherosclerosis), which can cause heart attacks and strokes. What are the causes? This condition is usually caused by a natural change in hormone levels that happens as you get older. The condition may also be caused by surgery to remove both ovaries (bilateral oophorectomy). What increases the risk? This condition is more likely to start at an earlier age if you have certain medical conditions or treatments, including:  A tumor of the pituitary gland in the brain.  A disease that affects the ovaries and hormone production.  Radiation treatment for cancer.  Certain cancer treatments, such as chemotherapy or hormone (anti-estrogen) therapy.  Heavy smoking and excessive alcohol use.  Family history of early menopause. This condition is also more likely to develop earlier in women who are very thin. What are the signs or symptoms? Symptoms of this condition include:  Hot flashes.  Irregular menstrual periods.  Night sweats.  Changes in feelings about sex. This could be a decrease in sex drive or an increased comfort around your sexuality.  Vaginal dryness and thinning of the vaginal walls. This may cause painful intercourse.  Dryness of the skin and development of wrinkles.  Headaches.  Problems sleeping (insomnia).  Mood swings or irritability.  Memory problems.  Weight gain.  Hair growth on the face and chest.  Bladder infections or problems with urinating. How  is this diagnosed? This condition is diagnosed based on your medical history, a physical exam, your age, your menstrual history, and your symptoms. Hormone tests may also be done. How is this treated? In some cases, no treatment is needed. You and your health care provider should make a decision together about whether treatment is necessary. Treatment will be based on your individual condition and preferences. Treatment for this condition focuses on managing symptoms. Treatment may include:  Menopausal hormone therapy (MHT).  Medicines to treat specific symptoms or complications.  Acupuncture.  Vitamin or herbal supplements. Before starting treatment, make sure to let your health care provider know if you have a personal or family history of:  Heart disease.  Breast cancer.  Blood clots.  Diabetes.  Osteoporosis. Follow these instructions at home: Lifestyle  Do not use any products that contain nicotine or tobacco, such as cigarettes and e-cigarettes. If you need help quitting, ask your health care provider.  Get at least 30 minutes of physical activity on 5 or more days each week.  Avoid alcoholic and caffeinated beverages, as well as spicy foods. This may help prevent hot flashes.  Get 7-8 hours of sleep each night.  If you have hot flashes, try: ? Dressing in layers. ? Avoiding things that may trigger hot flashes, such as spicy food, warm places, or stress. ? Taking slow, deep breaths when a hot flash starts. ? Keeping a fan in your home and office.  Find ways to manage stress, such as deep breathing, meditation, or journaling.  Consider going to group therapy with other women who are having menopause symptoms. Ask your health care provider about recommended group therapy meetings. Eating and   drinking  Eat a healthy, balanced diet that contains whole grains, lean protein, low-fat dairy, and plenty of fruits and vegetables.  Your health care provider may recommend  adding more soy to your diet. Foods that contain soy include tofu, tempeh, and soy milk.  Eat plenty of foods that contain calcium and vitamin D for bone health. Items that are rich in calcium include low-fat milk, yogurt, beans, almonds, sardines, broccoli, and kale. Medicines  Take over-the-counter and prescription medicines only as told by your health care provider.  Talk with your health care provider before starting any herbal supplements. If prescribed, take vitamins and supplements as told by your health care provider. These may include: ? Calcium. Women age 43 and older should get 1,200 mg (milligrams) of calcium every day. ? Vitamin D. Women need 600-800 International Units of vitamin D each day. ? Vitamins B12 and B6. Aim for 50 micrograms of B12 and 1.5 mg of B6 each day. General instructions  Keep track of your menstrual periods, including: ? When they occur. ? How heavy they are and how long they last. ? How much time passes between periods.  Keep track of your symptoms, noting when they start, how often you have them, and how long they last.  Use vaginal lubricants or moisturizers to help with vaginal dryness and improve comfort during sex.  Keep all follow-up visits as told by your health care provider. This is important. This includes any group therapy or counseling. Contact a health care provider if:  You are still having menstrual periods after age 37.  You have pain during sex.  You have not had a period for 12 months and you develop vaginal bleeding. Get help right away if:  You have: ? Severe depression. ? Excessive vaginal bleeding. ? Pain when you urinate. ? A fast or irregular heart beat (palpitations). ? Severe headaches. ? Abdomen (abdominal) pain or severe indigestion.  You fell and you think you have a broken bone.  You develop leg or chest pain.  You develop vision problems.  You feel a lump in your breast. Summary  Menopause is the normal  time of life when menstrual periods stop completely. It is usually confirmed by 12 months without a menstrual period.  The transition to menopause (perimenopause) most often happens between the ages of 29 and 93.  Symptoms can be managed through medicines, lifestyle changes, and complementary therapies such as acupuncture.  Eat a balanced diet that is rich in nutrients to promote bone health and heart health and to manage symptoms during menopause. This information is not intended to replace advice given to you by your health care provider. Make sure you discuss any questions you have with your health care provider. Document Revised: 09/15/2017 Document Reviewed: 11/05/2016 Elsevier Patient Education  2020 [employer].  Steps to Quit Smoking Smoking tobacco is the leading cause of preventable death. It can affect almost every organ in the body. Smoking puts you and people around you at risk for many serious, long-lasting (chronic) diseases. Quitting smoking can be hard, but it is one of the best things that you can do for your health. It is never too late to quit. How do I get ready to quit? When you decide to quit smoking, make a plan to help you succeed. Before you quit:  Pick a date to quit. Set a date within the next 2 weeks to give you time to prepare.  Write down the reasons why you are quitting. Keep  this list in places where you will see it often.  Tell your family, friends, and co-workers that you are quitting. Their support is important.  Talk with your doctor about the choices that may help you quit.  Find out if your health insurance will pay for these treatments.  Know the people, places, things, and activities that make you want to smoke (triggers). Avoid them. What first steps can I take to quit smoking?  Throw away all cigarettes at home, at work, and in your car.  Throw away the things that you use when you smoke, such as ashtrays and lighters.  Clean your car.  Make sure to empty the ashtray.  Clean your home, including curtains and carpets. What can I do to help me quit smoking? Talk with your doctor about taking medicines and seeing a counselor at the same time. You are more likely to succeed when you do both.  If you are pregnant or breastfeeding, talk with your doctor about counseling or other ways to quit smoking. Do not take medicine to help you quit smoking unless your doctor tells you to do so. To quit smoking: Quit right away  Quit smoking totally, instead of slowly cutting back on how much you smoke over a period of time.  Go to counseling. You are more likely to quit if you go to counseling sessions regularly. Take medicine You may take medicines to help you quit. Some medicines need a prescription, and some you can buy over-the-counter. Some medicines may contain a drug called nicotine to replace the nicotine in cigarettes. Medicines may:  Help you to stop having the desire to smoke (cravings).  Help to stop the problems that come when you stop smoking (withdrawal symptoms). Your doctor may ask you to use:  Nicotine patches, gum, or lozenges.  Nicotine inhalers or sprays.  Non-nicotine medicine that is taken by mouth. Find resources Find resources and other ways to help you quit smoking and remain smoke-free after you quit. These resources are most helpful when you use them often. They include:  Online chats with a Social worker.  Phone quitlines.  Printed Furniture conservator/restorer.  Support groups or group counseling.  Text messaging programs.  Mobile phone apps. Use apps on your mobile phone or tablet that can help you stick to your quit plan. There are many free apps for mobile phones and tablets as well as websites. Examples include Quit Guide from the [employer] and smokefree.gov  What things can I do to make it easier to quit?   Talk to your family and friends. Ask them to support and encourage you.  Call a phone quitline  (1-800-QUIT-NOW), reach out to support groups, or work with a Social worker.  Ask people who smoke to not smoke around you.  Avoid places that make you want to smoke, such as: ? Bars. ? Parties. ? Smoke-break areas at work.  Spend time with people who do not smoke.  Lower the stress in your life. Stress can make you want to smoke. Try these things to help your stress: ? Getting regular exercise. ? Doing deep-breathing exercises. ? Doing yoga. ? Meditating. ? Doing a body scan. To do this, close your eyes, focus on one area of your body at a time from head to toe. Notice which parts of your body are tense. Try to relax the muscles in those areas. How will I feel when I quit smoking? Day 1 to 3 weeks Within the first 24 hours, you may start  to have some problems that come from quitting tobacco. These problems are very bad 2-3 days after you quit, but they do not often last for more than 2-3 weeks. You may get these symptoms:  Mood swings.  Feeling restless, nervous, angry, or annoyed.  Trouble concentrating.  Dizziness.  Strong desire for high-sugar foods and nicotine.  Weight gain.  Trouble pooping (constipation).  Feeling like you may vomit (nausea).  Coughing or a sore throat.  Changes in how the medicines that you take for other issues work in your body.  Depression.  Trouble sleeping (insomnia). Week 3 and afterward After the first 2-3 weeks of quitting, you may start to notice more positive results, such as:  Better sense of smell and taste.  Less coughing and sore throat.  Slower heart rate.  Lower blood pressure.  Clearer skin.  Better breathing.  Fewer sick days. Quitting smoking can be hard. Do not give up if you fail the first time. Some people need to try a few times before they succeed. Do your best to stick to your quit plan, and talk with your doctor if you have any questions or concerns. Summary  Smoking tobacco is the leading cause of  preventable death. Quitting smoking can be hard, but it is one of the best things that you can do for your health.  When you decide to quit smoking, make a plan to help you succeed.  Quit smoking right away, not slowly over a period of time.  When you start quitting, seek help from your doctor, family, or friends. This information is not intended to replace advice given to you by your health care provider. Make sure you discuss any questions you have with your health care provider. Document Revised: 06/28/2019 Document Reviewed: 12/22/2018 Elsevier Patient Education  [place].

## 2020-04-01 NOTE — Progress Notes (Signed)
[address] [place], [place]  [address] Phone:  [phone]   Fax:  [phone]    Acute Office Visit  Subjective:    Patient ID: Barbara Stanley, female    DOB: 1971-04-26, 49 y.o.   MRN: [id number]  Chief Complaint  Patient presents with  . Follow-up    having chronic pain , discuss a lab results that she receive from [place]     HPI  Back Pain Patient presents for evaluation of low back problems. Symptoms have been present for several years and include pain in mid low back (sharp in character; varies /10 in severity). 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 Symptoms are worse varies. Alleviating factors identifiable by the patient are none. Aggravating factors identifiable by the patient are movement. Treatments initiated by the patient: NSAIS and APAP. Previous lower back problems: yes. Previous work up: images. Previous treatments: steriods and NSAIDs for her foot pain which was effective for all her pain.  She admits that she has been diagnosed with OA for years however her last arthritis panel was negative. She wants to know why she has the chronic pain.  Shoulder Pain Patient complains of right shoulder pain. The symptoms began several years ago. Aggravating factors: no known event. Pain is located around the acromioclavicular [facility]) joint. Discomfort is described as aching. Symptoms are exacerbated by overhead movements. Evaluation to date: none. Therapy to date includes: rest, avoidance of offending activity and prescription NSAIDS which are not very effective.  Menopausal symptoms Patient is in today for "hot flashes and night sweats" . She has been menopausal for several months. Currently on no HRT. Deneis bleeding. Other menopausal symptoms include: depression (moderate) and myalgias/arthralgias (moderate). Workup to date: CBC and TSH.  Menstrual History: OB History    Gravida  4   Para      Term       Preterm      AB      Living  3     SAB      TAB      Ectopic      Multiple      Live Births              Menarche age:48 No LMP recorded (lmp unknown). Patient has had a hysterectomy.     Past Medical History:  Diagnosis Date  . Abdominal pain   . Anemia   . Anxiety   . Bronchitis   . COPD (chronic obstructive pulmonary disease) ([place])   . Depression    bipolar  . Diarrhea   . Emphysema   . GERD (gastroesophageal reflux disease)   . History of blood transfusion   . Splenomegaly    r/t low platelets  . Thrombocytopenia ([place])   . Tobacco abuse     Past Surgical History:  Procedure Laterality Date  . ABDOMINAL HYSTERECTOMY    . c section x 3     . CHOLECYSTECTOMY    . GASTRIC BYPASS    . UPPER GASTROINTESTINAL ENDOSCOPY    . WISDOM TOOTH EXTRACTION      Family History  Problem Relation Age of Onset  . Diabetes Mother   . Diabetes Other   . Hypertension Other   . CAD Other   . Heart disease Father   . Colon cancer Neg Hx   . Rectal cancer Neg Hx   . Stomach cancer Neg Hx   .  Esophageal cancer Neg Hx     Social History   Socioeconomic History  . Marital status: Married    Spouse name: Not on file  . Number of children: 3  . Years of education: Not on file  . Highest education level: Not on file  Occupational History  . Occupation: disabled  Tobacco Use  . Smoking status: Current Every Day Smoker    Packs/day: 1.00    Years: 30.00    Pack years: 30.00    Types: Cigarettes  . Smokeless tobacco: Never Used  . Tobacco comment: tobacco info given 10/28/2019  Vaping Use  . Vaping Use: Never used  Substance and Sexual Activity  . Alcohol use: Yes    Comment: occasional wine  . Drug use: No  . Sexual activity: Yes    Birth control/protection: None    Comment: Hysterectomy  Other Topics Concern  . Not on file  Social History Narrative  . Not on file   Social Determinants of Health   Financial Resource Strain:   . Difficulty of  Paying Living Expenses:   Food Insecurity:   . Worried About Charity fundraiser in the Last Year:   . Arboriculturist in the Last Year:   Transportation Needs:   . Film/video editor (Medical):   [name] Lack of Transportation (Non-Medical):   Physical Activity:   . Days of Exercise per Week:   . Minutes of Exercise per Session:   Stress:   . Feeling of Stress :   Social Connections:   . Frequency of Communication with Friends and Family:   . Frequency of Social Gatherings with Friends and Family:   . Attends Religious Services:   . Active Member of Clubs or Organizations:   . Attends Archivist Meetings:   [name] Marital Status:   Intimate Partner Violence:   . Fear of Current or Ex-Partner:   . Emotionally Abused:   [name] Physically Abused:   . Sexually Abused:     Outpatient Medications Prior to Visit  Medication Sig Dispense Refill  . acetaminophen (TYLENOL) 500 MG tablet Take 1,000 mg by mouth daily as needed for moderate pain.    [name] albuterol (PROAIR HFA) 108 (90 Base) MCG/ACT inhaler Inhale 2 puffs into the lungs every 6 (six) hours as needed for wheezing or shortness of breath. 17 g 3  . albuterol (PROVENTIL) (2.5 MG/3ML) 0.083% nebulizer solution Take 3 mLs (2.5 mg total) by nebulization every 4 (four) hours as needed for wheezing or shortness of breath. 75 mL 5  . Blood Glucose Monitoring Suppl (ACCU-CHEK AVIVA PLUS) w/Device KIT 1 each by Does not apply route 2 (two) times daily as needed. 1 kit 0  . cholecalciferol (VITAMIN D3) 25 MCG (1000 UT) tablet Take 5,000 Units by mouth daily.    . colestipol (COLESTID) 1 g tablet Take 1 tablet (1 g total) by mouth as directed. 3 tablets every am and 2 tablets every pm 150 tablet 1  . Copper Gluconate 2 MG CAPS Take 4 mg by mouth daily.    . Cyanocobalamin (VITAMIN B-12 IJ) Inject 1 mg as directed as directed. Every 3 months    . diclofenac sodium (VOLTAREN) 1 % GEL Apply 2 g topically 4 (four) times daily. 2 g 3  . dicyclomine  (BENTYL) 20 MG tablet Take 1 tablet (20 mg total) by mouth as needed for spasms. 120 tablet 1  . esomeprazole (NEXIUM) 40 MG capsule TAKE 1 CAPSULE(40 MG)  BY MOUTH TWICE DAILY BEFORE A MEAL 60 capsule 3  . famotidine (PEPCID) 40 MG tablet Take 1 tablet (40 mg total) by mouth 2 (two) times daily. 60 tablet 3  . Fluticasone-Umeclidin-Vilant (TRELEGY ELLIPTA) 100-62.5-25 MCG/INH AEPB Inhale 1 puff into the lungs daily. 60 each 1  . folic acid (FOLVITE) 1 MG tablet TAKE 1 TABLET(1 MG) BY MOUTH DAILY 90 tablet 2  . glucose blood (ACCU-CHEK AVIVA PLUS) test strip Use as instructed 100 each 12  . meloxicam (MOBIC) 15 MG tablet Take 1 tablet (15 mg total) by mouth daily. 30 tablet 1  . mirabegron ER (MYRBETRIQ) 25 MG TB24 tablet Take 1 tablet (25 mg total) by mouth daily. 30 tablet 5  . riTUXimab-pvvr (RUXIENCE) 100 MG/10ML injection Inject into the vein as directed. Infusion     . solifenacin (VESICARE) 10 MG tablet Take 1 tablet (10 mg total) by mouth daily. 30 tablet 5  . traZODone (DESYREL) 150 MG tablet Take 1 tablet (150 mg total) by mouth at bedtime. For sleep (Patient taking differently: Take 300 mg by mouth at bedtime. For sleep) 30 tablet 0  . venlafaxine XR (EFFEXOR-XR) 75 MG 24 hr capsule Take 1 capsule (75 mg total) by mouth daily with breakfast. For depression 30 capsule 0  . Accu-Chek Softclix Lancets lancets Use as instructed 100 each 12   No facility-administered medications prior to visit.    Allergies  Allergen Reactions  . Other     "Banana Bag" given to patient at physician's office, severe back pain, SOB    Review of Systems  Endocrine:       Hot flashes mostly at night few month        Objective:    Physical Exam Constitutional:      General: She is not in acute distress.    Appearance: She is obese. She is not ill-appearing.  HENT:     Head: Normocephalic and atraumatic.     Nose: Nose normal.     Mouth/Throat:     Pharynx: Oropharynx is clear.    Cardiovascular:     Rate and Rhythm: Normal rate and regular rhythm.     Pulses: Normal pulses.     Heart sounds: Normal heart sounds.  Pulmonary:     Effort: Pulmonary effort is normal.     Breath sounds: Normal breath sounds.  Abdominal:     General: Bowel sounds are normal.     Palpations: Abdomen is soft.  Musculoskeletal:     Right shoulder: Tenderness present. Decreased range of motion. Normal strength.     Cervical back: Normal range of motion.     Lumbar back: Tenderness present. No swelling. Decreased range of motion. No scoliosis.     Right lower leg: No edema.     Left lower leg: No edema.  Neurological:     Mental Status: She is alert.     BP 115/70 (BP Location: Left Arm, Patient Position: Sitting)   Pulse 71   Temp 98.1 F (36.7 C) (Temporal)   Wt 238 lb (108 kg)   LMP  (LMP Unknown)   SpO2 95%   BMI 38.12 kg/m  Wt Readings from Last 3 Encounters:  04/01/20 238 lb (108 kg)  04/01/20 237 lb 8 oz (107.7 kg)  03/26/20 231 lb 6.4 oz (105 kg)    There are no preventive care reminders to display for this patient.  There are no preventive care reminders to display for this patient.   Lab  Results  Component Value Date   TSH 0.74 09/05/2018   Lab Results  Component Value Date   WBC 3.6 (L) 03/26/2020   HGB 14.3 03/26/2020   HCT 42.8 03/26/2020   MCV 90.9 03/26/2020   PLT 26 (L) 03/26/2020   Lab Results  Component Value Date   NA 141 03/26/2020   K 3.9 03/26/2020   CO2 25 03/26/2020   GLUCOSE 70 03/26/2020   BUN 13 03/26/2020   CREATININE 0.73 03/26/2020   BILITOT 0.7 03/26/2020   ALKPHOS 70 03/26/2020   AST 15 03/26/2020   ALT 15 03/26/2020   PROT 6.3 (L) 03/26/2020   ALBUMIN 3.9 03/26/2020   CALCIUM 8.8 (L) 03/26/2020   ANIONGAP 10 03/26/2020   GFR 93.62 12/25/2019   Lab Results  Component Value Date   CHOL 128 12/31/2019   Lab Results  Component Value Date   HDL 52 12/31/2019   Lab Results  Component Value Date   LDLCALC 63  12/31/2019   Lab Results  Component Value Date   TRIG 61 12/31/2019   Lab Results  Component Value Date   CHOLHDL 2.5 12/31/2019   Lab Results  Component Value Date   HGBA1C 5.9 (A) 04/01/2020   HGBA1C 5.9 04/01/2020   HGBA1C 5.9 04/01/2020   HGBA1C 5.9 04/01/2020       Assessment & Plan:   Problem List Items Addressed This Visit      Respiratory   COPD (chronic obstructive pulmonary disease) ([place])     Other   Major depression, recurrent ([place])    Other Visit Diagnoses    Low blood sugar    -  Primary   Relevant Orders   HgB A1c (Completed)   POCT Urinalysis Dipstick (Completed)   Chronic midline low back pain without sciatica       Relevant Orders   DG Lumbar Spine Complete W/Bend For further evaluation of any degenerative changes   Hot flashes     Written education provided   Chronic right shoulder pain       Relevant Orders   DG Shoulder Right For further evaluation   Class 2 obesity due to excess calories with body mass index (BMI) of 38.0 to 38.9 in adult, unspecified whether serious comorbidity present     Encourage weight loss       No orders of the defined types were placed in this encounter.    [name], NP

## 2020-04-01 NOTE — Patient Instructions (Addendum)
I did not change your medications today.   Please follow up in 4 months.  I am so glad that your bowel habits are back to normal. Please call me or send me a MyChart message with any questions or concerns before your next appointment.  Thank you for trusting me with your gastrointestinal care!    Thornton Park, MD, MPH

## 2020-04-03 ENCOUNTER — Ambulatory Visit: Payer: Medicaid Other | Admitting: Obstetrics and Gynecology

## 2020-04-03 ENCOUNTER — Inpatient Hospital Stay: Payer: Medicaid Other

## 2020-04-03 ENCOUNTER — Other Ambulatory Visit: Payer: Self-pay

## 2020-04-03 ENCOUNTER — Other Ambulatory Visit: Payer: Self-pay | Admitting: Hematology and Oncology

## 2020-04-03 VITALS — BP 99/65 | HR 58 | Temp 98.6°F | Resp 12

## 2020-04-03 DIAGNOSIS — D693 Immune thrombocytopenic purpura: Secondary | ICD-10-CM

## 2020-04-03 MED ORDER — ACETAMINOPHEN 325 MG PO TABS
650.0000 mg | ORAL_TABLET | Freq: Once | ORAL | Status: AC
  Administered 2020-04-03: 650 mg via ORAL

## 2020-04-03 MED ORDER — SODIUM CHLORIDE 0.9 % IV SOLN
10.0000 mg | Freq: Once | INTRAVENOUS | Status: AC
  Administered 2020-04-03: 10 mg via INTRAVENOUS
  Filled 2020-04-03: qty 10

## 2020-04-03 MED ORDER — DIPHENHYDRAMINE HCL 25 MG PO CAPS
ORAL_CAPSULE | ORAL | Status: AC
  Filled 2020-04-03: qty 2

## 2020-04-03 MED ORDER — SODIUM CHLORIDE 0.9 % IV SOLN
375.0000 mg/m2 | Freq: Once | INTRAVENOUS | Status: AC
  Administered 2020-04-03: 800 mg via INTRAVENOUS
  Filled 2020-04-03: qty 50

## 2020-04-03 MED ORDER — DIPHENHYDRAMINE HCL 25 MG PO CAPS
50.0000 mg | ORAL_CAPSULE | Freq: Once | ORAL | Status: AC
  Administered 2020-04-03: 50 mg via ORAL

## 2020-04-03 MED ORDER — SODIUM CHLORIDE 0.9 % IV SOLN
Freq: Once | INTRAVENOUS | Status: AC
  Filled 2020-04-03: qty 250

## 2020-04-03 MED ORDER — ACETAMINOPHEN 325 MG PO TABS
ORAL_TABLET | ORAL | Status: AC
  Filled 2020-04-03: qty 2

## 2020-04-03 NOTE — Patient Instructions (Signed)
[facility] Discharge Instructions for Patients Receiving Chemotherapy  Today you received the following immunotherapy agents :  Rituxamab  To help prevent nausea and vomiting after your treatment, we encourage you to take your nausea medication if needed & as prescribed. If you develop nausea and vomiting that is not controlled by your nausea medication, call the clinic.   BELOW ARE SYMPTOMS THAT SHOULD BE REPORTED IMMEDIATELY:  *FEVER GREATER THAN 100.5 F  *CHILLS WITH OR WITHOUT FEVER  NAUSEA AND VOMITING THAT IS NOT CONTROLLED WITH YOUR NAUSEA MEDICATION  *UNUSUAL SHORTNESS OF BREATH  *UNUSUAL BRUISING OR BLEEDING  TENDERNESS IN MOUTH AND THROAT WITH OR WITHOUT PRESENCE OF ULCERS  *URINARY PROBLEMS  *BOWEL PROBLEMS  UNUSUAL RASH Items with * indicate a potential emergency and should be followed up as soon as possible.  Feel free to call the clinic should you have any questions or concerns. The clinic phone number is [phone].  Please show the [facility] at check-in to the Emergency Department and triage nurse.

## 2020-04-09 ENCOUNTER — Other Ambulatory Visit: Payer: Self-pay

## 2020-04-09 ENCOUNTER — Inpatient Hospital Stay: Payer: Medicaid Other

## 2020-04-09 ENCOUNTER — Inpatient Hospital Stay (HOSPITAL_BASED_OUTPATIENT_CLINIC_OR_DEPARTMENT_OTHER): Payer: Medicaid Other | Admitting: Hematology and Oncology

## 2020-04-09 ENCOUNTER — Ambulatory Visit (HOSPITAL_COMMUNITY)
Admission: RE | Admit: 2020-04-09 | Discharge: 2020-04-09 | Disposition: A | Discharge status: Home / Self Care | Payer: Medicaid Other | Source: Ambulatory Visit | Attending: Nurse Practitioner | Admitting: Nurse Practitioner

## 2020-04-09 ENCOUNTER — Encounter: Payer: Self-pay | Admitting: Hematology and Oncology

## 2020-04-09 DIAGNOSIS — M545 Low back pain: Secondary | ICD-10-CM | POA: Insufficient documentation

## 2020-04-09 DIAGNOSIS — D693 Immune thrombocytopenic purpura: Secondary | ICD-10-CM

## 2020-04-09 DIAGNOSIS — D61818 Other pancytopenia: Secondary | ICD-10-CM | POA: Diagnosis not present

## 2020-04-09 DIAGNOSIS — M25511 Pain in right shoulder: Secondary | ICD-10-CM | POA: Insufficient documentation

## 2020-04-09 DIAGNOSIS — G8929 Other chronic pain: Secondary | ICD-10-CM

## 2020-04-09 LAB — COMPREHENSIVE METABOLIC PANEL
ALT: 14 U/L (ref 0–44)
AST: 14 U/L — ABNORMAL LOW (ref 15–41)
Albumin: 3.7 g/dL (ref 3.5–5.0)
Alkaline Phosphatase: 60 U/L (ref 38–126)
Anion gap: 8 (ref 5–15)
BUN: 13 mg/dL (ref 6–20)
CO2: 29 mmol/L (ref 22–32)
Calcium: 8.8 mg/dL — ABNORMAL LOW (ref 8.9–10.3)
Chloride: 105 mmol/L (ref 98–111)
Creatinine, Ser: 0.73 mg/dL (ref 0.44–1.00)
GFR calc Af Amer: >60 mL/min (ref >60)
GFR calc non Af Amer: >60 mL/min (ref >60)
Glucose, Bld: 92 mg/dL (ref 70–99)
Potassium: 4.5 mmol/L (ref 3.5–5.1)
Sodium: 142 mmol/L (ref 135–145)
Total Bilirubin: 0.7 mg/dL (ref 0.3–1.2)
Total Protein: 5.8 g/dL — ABNORMAL LOW (ref 6.5–8.1)

## 2020-04-09 LAB — CBC WITH DIFFERENTIAL/PLATELET
Abs Immature Granulocytes: 0.03 10*3/uL (ref 0.00–0.07)
Basophils Absolute: 0.1 10*3/uL (ref 0.0–0.1)
Basophils Relative: 2 %
Eosinophils Absolute: 0.1 10*3/uL (ref 0.0–0.5)
Eosinophils Relative: 1 %
HCT: 41.8 % (ref 36.0–46.0)
Hemoglobin: 13.7 g/dL (ref 12.0–15.0)
Immature Granulocytes: 1 %
Lymphocytes Relative: 14 %
Lymphs Abs: 0.6 10*3/uL — ABNORMAL LOW (ref 0.7–4.0)
MCH: 30.2 pg (ref 26.0–34.0)
MCHC: 32.8 g/dL (ref 30.0–36.0)
MCV: 92.1 fL (ref 80.0–100.0)
Monocytes Absolute: 0.3 10*3/uL (ref 0.1–1.0)
Monocytes Relative: 7 %
Neutro Abs: 3.3 10*3/uL (ref 1.7–7.7)
Neutrophils Relative %: 75 %
Platelets: 26 10*3/uL — ABNORMAL LOW (ref 150–400)
RBC: 4.54 MIL/uL (ref 3.87–5.11)
RDW: 14.8 % (ref 11.5–15.5)
WBC: 4.3 10*3/uL (ref 4.0–10.5)
nRBC: 0 % (ref 0.0–0.2)

## 2020-04-09 LAB — ABO/RH: ABO/RH(D): B POS

## 2020-04-09 NOTE — Assessment & Plan Note (Signed)
So far, she has no response to rituximab She will complete her treatment as scheduled I plan to see her back next month for further follow-up If she has persistent thrombocytopenia, she would need repeat bone marrow aspirate and biopsy She is in agreement

## 2020-04-09 NOTE — Assessment & Plan Note (Signed)
Observe only for now, no need treatment for low platelets for now

## 2020-04-09 NOTE — Progress Notes (Signed)
[facility] OFFICE PROGRESS NOTE  [name], NP  ASSESSMENT & PLAN:  Chronic ITP (idiopathic thrombocytopenia) (HCC) So far, she has no response to rituximab She will complete her treatment as scheduled I plan to see her back next month for further follow-up If she has persistent thrombocytopenia, she would need repeat bone marrow aspirate and biopsy She is in agreement  Other pancytopenia ([place]) Observe only for now, no need treatment for low platelets for now   No orders of the defined types were placed in this encounter.   The total time spent in the appointment was 15 minutes encounter with patients including review of chart and various tests results, discussions about plan of care and coordination of care plan   All questions were answered. The patient knows to call the clinic with any problems, questions or concerns. No barriers to learning was detected.    Heath Lark, MD 6/24/20215:40 PM  INTERVAL HISTORY: Barbara Stanley 49 y.o. female returns for follow-up on chronic intermittent pancytopenia/ITP She has received 2 doses of rituximab so far She had infusion reaction with first dose but no other infusion reaction recently The patient denies any recent signs or symptoms of bleeding such as spontaneous epistaxis, hematuria or hematochezia.  SUMMARY OF HEMATOLOGIC HISTORY: Oncology History Overview Note  Cytogenetics showed 2 cell lines.  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  Starting around 2018, she was noted to have progressive mild pancytopenia.  Most recently, around October 2019, she is noted to have significant pancytopenia. She was referred to see hematologist for evaluation.  The patient has history of gastric bypass  surgery.  She has significant recent weight loss. Her primary care doctor has ordered multiple mineral panel.  She was noted to have folate, B6, B12, copper as well as iron deficiency.  She has received multiple mineral replacement therapy.  When she was seen here at the hematology clinic, she received high-dose vitamin B12 injection. On August 09, 2018, she underwent ultrasound of her abdomen which showed no evidence of splenomegaly.  The patient has significant smoking history but denies alcohol intake She denies significant recent infection requiring intravenous antibiotic treatment. Infectious work-up was negative. Between October to December 2019, she has received intravenous iron infusion along with vitamin B12 injection. On October 01, 2018, she underwent bone marrow aspirate and biopsy to evaluate for pancytopenia.  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  She was given vitamin B12 injection, oral copper and oral vitamin D replacement therapy   10/01/2018 Bone Marrow Biopsy   Bone Marrow, Aspirate,Biopsy, and Clot - SLIGHTLY HYPERCELLULAR BONE MARROW FOR AGE WITH DYSPOIETIC CHANGES. - SEE COMMENT. PERIPHERAL BLOOD: - PANCYTOPENIA. Diagnosis Note The bone marrow is slightly hypercellular for age with trilineage hematopoiesis but with relative abundance of erythroid precursors. This is associated with dyspoietic changes variably involving myeloid cell lines but with no increase in blastic cells The differential diagnosis includes secondary changes due to nutritional deficiency, medication, infection, autoimmune disease, toxins, etc. as well as a primary clonal myelodysplastic process. Correlation with cytogenetic and FISH studies is recommended. (BNS:kh 10/02/18)   She received 2 doses of rituximab for ITP  I have reviewed the  past medical history, past surgical history, social history and  family history with the patient and they are unchanged from previous note.  ALLERGIES:  is allergic to other.  MEDICATIONS:  Current Outpatient Medications  Medication Sig Dispense Refill  . Accu-Chek Softclix Lancets lancets Use as instructed 100 each 12  . acetaminophen (TYLENOL) 500 MG tablet Take 1,000 mg by mouth daily as needed for moderate pain.    [name] albuterol (PROAIR HFA) 108 (90 Base) MCG/ACT inhaler Inhale 2 puffs into the lungs every 6 (six) hours as needed for wheezing or shortness of breath. 17 g 3  . albuterol (PROVENTIL) (2.5 MG/3ML) 0.083% nebulizer solution Take 3 mLs (2.5 mg total) by nebulization every 4 (four) hours as needed for wheezing or shortness of breath. 75 mL 5  . Blood Glucose Monitoring Suppl (ACCU-CHEK AVIVA PLUS) w/Device KIT 1 each by Does not apply route 2 (two) times daily as needed. 1 kit 0  . cholecalciferol (VITAMIN D3) 25 MCG (1000 UT) tablet Take 5,000 Units by mouth daily.    . colestipol (COLESTID) 1 g tablet Take 1 tablet (1 g total) by mouth as directed. 3 tablets every am and 2 tablets every pm 150 tablet 1  . Copper Gluconate 2 MG CAPS Take 4 mg by mouth daily.    . Cyanocobalamin (VITAMIN B-12 IJ) Inject 1 mg as directed as directed. Every 3 months    . diclofenac sodium (VOLTAREN) 1 % GEL Apply 2 g topically 4 (four) times daily. 2 g 3  . dicyclomine (BENTYL) 20 MG tablet Take 1 tablet (20 mg total) by mouth as needed for spasms. 120 tablet 1  . esomeprazole (NEXIUM) 40 MG capsule TAKE 1 CAPSULE(40 MG) BY MOUTH TWICE DAILY BEFORE A MEAL 60 capsule 3  . famotidine (PEPCID) 40 MG tablet Take 1 tablet (40 mg total) by mouth 2 (two) times daily. 60 tablet 3  . Fluticasone-Umeclidin-Vilant (TRELEGY ELLIPTA) 100-62.5-25 MCG/INH AEPB Inhale 1 puff into the lungs daily. 60 each 1  . folic acid (FOLVITE) 1 MG tablet TAKE 1 TABLET(1 MG) BY MOUTH DAILY 90 tablet 2  . glucose blood (ACCU-CHEK AVIVA PLUS) test strip Use as instructed 100 each 12  . meloxicam  (MOBIC) 15 MG tablet Take 1 tablet (15 mg total) by mouth daily. 30 tablet 1  . mirabegron ER (MYRBETRIQ) 25 MG TB24 tablet Take 1 tablet (25 mg total) by mouth daily. 30 tablet 5  . riTUXimab-pvvr (RUXIENCE) 100 MG/10ML injection Inject into the vein as directed. Infusion     . solifenacin (VESICARE) 10 MG tablet Take 1 tablet (10 mg total) by mouth daily. 30 tablet 5  . traZODone (DESYREL) 150 MG tablet Take 1 tablet (150 mg total) by mouth at bedtime. For sleep (Patient taking differently: Take 300 mg by mouth at bedtime. For sleep) 30 tablet 0  . venlafaxine XR (EFFEXOR-XR) 75 MG 24 hr capsule Take 1 capsule (75 mg total) by mouth daily with breakfast. For depression 30 capsule 0   No current facility-administered medications for this visit.     REVIEW OF SYSTEMS:   Constitutional: Denies fevers, chills or night sweats Eyes: Denies blurriness of vision Ears, nose, mouth, throat, and face: Denies mucositis or sore throat Respiratory: Denies cough, dyspnea or wheezes Cardiovascular: Denies palpitation, chest discomfort or lower extremity swelling Gastrointestinal:  Denies nausea, heartburn or change in bowel habits Skin: Denies abnormal skin rashes Lymphatics: Denies new lymphadenopathy or easy bruising Neurological:Denies numbness, tingling or new weaknesses Behavioral/Psych: Mood  is stable, no new changes  All other systems were reviewed with the patient and are negative.  PHYSICAL EXAMINATION: ECOG PERFORMANCE STATUS: 0 - Asymptomatic  Vitals:   04/09/20 1007  BP: 110/66  Pulse: 61  Resp: 17  Temp: 98 F (36.7 C)  SpO2: 97%   Filed Weights   04/09/20 1007  Weight: 234 lb 11.2 oz (106.5 kg)    GENERAL:alert, no distress and comfortable NEURO: alert & oriented x 3 with fluent speech, no focal motor/sensory deficits  LABORATORY DATA:  I have reviewed the data as listed     Component Value Date/Time   NA 142 04/09/2020 0947   NA 140 07/04/2019 1110   K 4.5  04/09/2020 0947   CL 105 04/09/2020 0947   CO2 29 04/09/2020 0947   GLUCOSE 92 04/09/2020 0947   BUN 13 04/09/2020 0947   BUN 13 07/04/2019 1110   CREATININE 0.73 04/09/2020 0947   CREATININE 0.78 03/05/2020 1039   CREATININE 0.59 08/07/2017 1410   CALCIUM 8.8 (L) 04/09/2020 0947   PROT 5.8 (L) 04/09/2020 0947   PROT 6.2 07/04/2019 1110   ALBUMIN 3.7 04/09/2020 0947   ALBUMIN 4.5 07/04/2019 1110   AST 14 (L) 04/09/2020 0947   ALT 14 04/09/2020 0947   ALKPHOS 60 04/09/2020 0947   BILITOT 0.7 04/09/2020 0947   BILITOT 0.4 07/04/2019 1110   GFRNONAA >60 04/09/2020 0947   GFRNONAA >60 03/05/2020 1039   GFRNONAA 110 08/07/2017 1410   GFRAA >60 04/09/2020 0947   GFRAA >60 03/05/2020 1039   GFRAA 127 08/07/2017 1410    No results found for: SPEP, UPEP  Lab Results  Component Value Date   WBC 4.3 04/09/2020   NEUTROABS 3.3 04/09/2020   HGB 13.7 04/09/2020   HCT 41.8 04/09/2020   MCV 92.1 04/09/2020   PLT 26 (L) 04/09/2020      Chemistry      Component Value Date/Time   NA 142 04/09/2020 0947   NA 140 07/04/2019 1110   K 4.5 04/09/2020 0947   CL 105 04/09/2020 0947   CO2 29 04/09/2020 0947   BUN 13 04/09/2020 0947   BUN 13 07/04/2019 1110   CREATININE 0.73 04/09/2020 0947   CREATININE 0.78 03/05/2020 1039   CREATININE 0.59 08/07/2017 1410      Component Value Date/Time   CALCIUM 8.8 (L) 04/09/2020 0947   ALKPHOS 60 04/09/2020 0947   AST 14 (L) 04/09/2020 0947   ALT 14 04/09/2020 0947   BILITOT 0.7 04/09/2020 0947   BILITOT 0.4 07/04/2019 1110

## 2020-04-10 ENCOUNTER — Other Ambulatory Visit: Payer: Self-pay

## 2020-04-10 ENCOUNTER — Encounter: Payer: Self-pay | Admitting: Primary Care

## 2020-04-10 ENCOUNTER — Ambulatory Visit (INDEPENDENT_AMBULATORY_CARE_PROVIDER_SITE_OTHER): Payer: Medicaid Other | Admitting: Primary Care

## 2020-04-10 ENCOUNTER — Inpatient Hospital Stay: Payer: Medicaid Other

## 2020-04-10 VITALS — BP 116/68 | HR 61 | Temp 98.0°F | Resp 18

## 2020-04-10 DIAGNOSIS — J449 Chronic obstructive pulmonary disease, unspecified: Secondary | ICD-10-CM

## 2020-04-10 DIAGNOSIS — K909 Intestinal malabsorption, unspecified: Secondary | ICD-10-CM

## 2020-04-10 DIAGNOSIS — D509 Iron deficiency anemia, unspecified: Secondary | ICD-10-CM

## 2020-04-10 DIAGNOSIS — D693 Immune thrombocytopenic purpura: Secondary | ICD-10-CM | POA: Diagnosis not present

## 2020-04-10 DIAGNOSIS — F17210 Nicotine dependence, cigarettes, uncomplicated: Secondary | ICD-10-CM | POA: Diagnosis not present

## 2020-04-10 DIAGNOSIS — Z9884 Bariatric surgery status: Secondary | ICD-10-CM

## 2020-04-10 DIAGNOSIS — D696 Thrombocytopenia, unspecified: Secondary | ICD-10-CM

## 2020-04-10 DIAGNOSIS — E538 Deficiency of other specified B group vitamins: Secondary | ICD-10-CM

## 2020-04-10 MED ORDER — TRELEGY ELLIPTA 100-62.5-25 MCG/INH IN AEPB: 1.0000 | INHALATION_SPRAY | Freq: Every day | RESPIRATORY_TRACT | 5 refills | Status: DC

## 2020-04-10 MED ORDER — SODIUM CHLORIDE 0.9 % IV SOLN
375.0000 mg/m2 | Freq: Once | INTRAVENOUS | Status: AC
  Administered 2020-04-10: 800 mg via INTRAVENOUS
  Filled 2020-04-10: qty 50

## 2020-04-10 MED ORDER — CYANOCOBALAMIN 1000 MCG/ML IJ SOLN
INTRAMUSCULAR | Status: AC
  Filled 2020-04-10: qty 1

## 2020-04-10 MED ORDER — ACETAMINOPHEN 325 MG PO TABS
650.0000 mg | ORAL_TABLET | Freq: Once | ORAL | Status: AC
  Administered 2020-04-10: 650 mg via ORAL

## 2020-04-10 MED ORDER — SODIUM CHLORIDE 0.9 % IV SOLN
Freq: Once | INTRAVENOUS | Status: AC
  Filled 2020-04-10: qty 250

## 2020-04-10 MED ORDER — CYANOCOBALAMIN 1000 MCG/ML IJ SOLN
1000.0000 ug | Freq: Once | INTRAMUSCULAR | Status: AC
  Administered 2020-04-10: 1000 ug via INTRAMUSCULAR

## 2020-04-10 MED ORDER — ACETAMINOPHEN 325 MG PO TABS
ORAL_TABLET | ORAL | Status: AC
  Filled 2020-04-10: qty 2

## 2020-04-10 MED ORDER — DIPHENHYDRAMINE HCL 25 MG PO CAPS
ORAL_CAPSULE | ORAL | Status: AC
  Filled 2020-04-10: qty 2

## 2020-04-10 MED ORDER — DIPHENHYDRAMINE HCL 25 MG PO CAPS
50.0000 mg | ORAL_CAPSULE | Freq: Once | ORAL | Status: AC
  Administered 2020-04-10: 50 mg via ORAL

## 2020-04-10 MED ORDER — SODIUM CHLORIDE 0.9 % IV SOLN
10.0000 mg | Freq: Once | INTRAVENOUS | Status: AC
  Administered 2020-04-10: 10 mg via INTRAVENOUS
  Filled 2020-04-10: qty 10

## 2020-04-10 NOTE — Patient Instructions (Addendum)
Recommendations: Continue Trelegy 1 puff in the morning each day (mouth after use) Continue to encourage you to to quit smoking, taper down amount you smoke and pick a quit date.  Follow-up: 6 months with Dr. Valeta Harms or APP (December 2021)   Steps to Quit Smoking Smoking tobacco is the leading cause of preventable death. It can affect almost every organ in the body. Smoking puts you and people around you at risk for many serious, long-lasting (chronic) diseases. Quitting smoking can be hard, but it is one of the best things that you can do for your health. It is never too late to quit. How do I get ready to quit? When you decide to quit smoking, make a plan to help you succeed. Before you quit:  Pick a date to quit. Set a date within the next 2 weeks to give you time to prepare.  Write down the reasons why you are quitting. Keep this list in places where you will see it often.  Tell your family, friends, and co-workers that you are quitting. Their support is important.  Talk with your doctor about the choices that may help you quit.  Find out if your health insurance will pay for these treatments.  Know the people, places, things, and activities that make you want to smoke (triggers). Avoid them. What first steps can I take to quit smoking?  Throw away all cigarettes at home, at work, and in your car.  Throw away the things that you use when you smoke, such as ashtrays and lighters.  Clean your car. Make sure to empty the ashtray.  Clean your home, including curtains and carpets. What can I do to help me quit smoking? Talk with your doctor about taking medicines and seeing a counselor at the same time. You are more likely to succeed when you do both.  If you are pregnant or breastfeeding, talk with your doctor about counseling or other ways to quit smoking. Do not take medicine to help you quit smoking unless your doctor tells you to do so. To quit smoking: Quit right away  Quit  smoking totally, instead of slowly cutting back on how much you smoke over a period of time.  Go to counseling. You are more likely to quit if you go to counseling sessions regularly. Take medicine You may take medicines to help you quit. Some medicines need a prescription, and some you can buy over-the-counter. Some medicines may contain a drug called nicotine to replace the nicotine in cigarettes. Medicines may:  Help you to stop having the desire to smoke (cravings).  Help to stop the problems that come when you stop smoking (withdrawal symptoms). Your doctor may ask you to use:  Nicotine patches, gum, or lozenges.  Nicotine inhalers or sprays.  Non-nicotine medicine that is taken by mouth. Find resources Find resources and other ways to help you quit smoking and remain smoke-free after you quit. These resources are most helpful when you use them often. They include:  Online chats with a Social worker.  Phone quitlines.  Printed Furniture conservator/restorer.  Support groups or group counseling.  Text messaging programs.  Mobile phone apps. Use apps on your mobile phone or tablet that can help you stick to your quit plan. There are many free apps for mobile phones and tablets as well as websites. Examples include Quit Guide from the [employer] and smokefree.gov  What things can I do to make it easier to quit?   Talk to your family  and friends. Ask them to support and encourage you.  Call a phone quitline (1-800-QUIT-NOW), reach out to support groups, or work with a Social worker.  Ask people who smoke to not smoke around you.  Avoid places that make you want to smoke, such as: ? Bars. ? Parties. ? Smoke-break areas at work.  Spend time with people who do not smoke.  Lower the stress in your life. Stress can make you want to smoke. Try these things to help your stress: ? Getting regular exercise. ? Doing deep-breathing exercises. ? Doing yoga. ? Meditating. ? Doing a body scan. To do  this, close your eyes, focus on one area of your body at a time from head to toe. Notice which parts of your body are tense. Try to relax the muscles in those areas. How will I feel when I quit smoking? Day 1 to 3 weeks Within the first 24 hours, you may start to have some problems that come from quitting tobacco. These problems are very bad 2-3 days after you quit, but they do not often last for more than 2-3 weeks. You may get these symptoms:  Mood swings.  Feeling restless, nervous, angry, or annoyed.  Trouble concentrating.  Dizziness.  Strong desire for high-sugar foods and nicotine.  Weight gain.  Trouble pooping (constipation).  Feeling like you may vomit (nausea).  Coughing or a sore throat.  Changes in how the medicines that you take for other issues work in your body.  Depression.  Trouble sleeping (insomnia). Week 3 and afterward After the first 2-3 weeks of quitting, you may start to notice more positive results, such as:  Better sense of smell and taste.  Less coughing and sore throat.  Slower heart rate.  Lower blood pressure.  Clearer skin.  Better breathing.  Fewer sick days. Quitting smoking can be hard. Do not give up if you fail the first time. Some people need to try a few times before they succeed. Do your best to stick to your quit plan, and talk with your doctor if you have any questions or concerns. Summary  Smoking tobacco is the leading cause of preventable death. Quitting smoking can be hard, but it is one of the best things that you can do for your health.  When you decide to quit smoking, make a plan to help you succeed.  Quit smoking right away, not slowly over a period of time.  When you start quitting, seek help from your doctor, family, or friends. This information is not intended to replace advice given to you by your health care provider. Make sure you discuss any questions you have with your health care provider. Document  Revised: 06/28/2019 Document Reviewed: 12/22/2018 Elsevier Patient Education  [place].

## 2020-04-10 NOTE — Patient Instructions (Signed)
[facility] Discharge Instructions for Patients Receiving Chemotherapy  Today you received the following chemotherapy agents:  Rituxan.  To help prevent nausea and vomiting after your treatment, we encourage you to take your nausea medication as directed.   If you develop nausea and vomiting that is not controlled by your nausea medication, call the clinic.   BELOW ARE SYMPTOMS THAT SHOULD BE REPORTED IMMEDIATELY:  *FEVER GREATER THAN 100.5 F  *CHILLS WITH OR WITHOUT FEVER  NAUSEA AND VOMITING THAT IS NOT CONTROLLED WITH YOUR NAUSEA MEDICATION  *UNUSUAL SHORTNESS OF BREATH  *UNUSUAL BRUISING OR BLEEDING  TENDERNESS IN MOUTH AND THROAT WITH OR WITHOUT PRESENCE OF ULCERS  *URINARY PROBLEMS  *BOWEL PROBLEMS  UNUSUAL RASH Items with * indicate a potential emergency and should be followed up as soon as possible.  Feel free to call the clinic should you have any questions or concerns. The clinic phone number is [phone].  Please show the CHEMO ALERT CARD at check-in to the Emergency Department and triage nurse.   

## 2020-04-10 NOTE — Assessment & Plan Note (Addendum)
-   Stable interval, no acute respiratory complaints. Reports significant improvement in breathing since starting Trelegy Plan: - Continue Trelegy 100 one puff daily in the morning (rinse mouth after use) - Strongly encouraged smoking cessation, recommend taper down amount and pick a quit date. She is not a candidate for Chantix d/t previous SI, she can use nicotine patches  Follow-up: - 6 months with Dr. Valeta Harms or APP (December 2021)

## 2020-04-10 NOTE — Progress Notes (Signed)
'@Patient'  ID: [name], female    DOB: 1971/01/07, 49 y.o.   MRN: [id number]  Chief Complaint  Patient presents with  . Follow-up    rare use of ned or inhaler, trelegy working well    Referring provider: [name], NP  HPI: 49 year old female, current smoker. PMH significant for COPD, GERD, chronic idiopathic thrombocytopenia, MDD, gastric bypass, myelodysplastic syndrome, tobacco use disorder. Patient of Dr. Valeta Harms, last seen on 07/03/19. Maintained on Trelegy   Previous [facility] pulmonary encounters: 49 year old female past medical history of COPD depression emphysema gastroesophageal reflux disease tobacco abuse. 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 Patient was referred to pulmonary at her last primary care visit.  She was also advised to quit smoking.  Longtime smoker.  No prior pulmonary function tests. She was told in the past that she has chronic bronchitis. She used Symbicort and Spiriva HandiHaler. Smoked since age 69, continues to smoke 1 ppd. She has always had difficult quitting. She has used Wellbutrin in the past. She has mental health issues and feels uncomfortable with using chantix.  She still has significant respiratory symptoms with exertion.  She also notices heat and humidity makes her symptoms worse.  She does have nocturnal wheezing and shortness of breath.  OV 07/03/2019: Patient here for follow-up regarding recent initiation of Trelegy.  Had pulmonary function test completed at the beginning of the week with an FEV1 of 67%.  New diagnosis of stage II COPD.  CT imaging with evidence of emphysema as well.  Overall since starting new inhaler she is breathing somewhat better.  She has tapered herself down from a full pack per day of smoking to two thirds of a pack of cigarettes per day.  She is trying to quit however is exposed to ongoing secondhand smoke from her family as well.  Patient has daily cough and occasional wheezing.  Denies hemoptysis, fevers night sweats weight loss.     04/10/2020- Interim hx Breathing wise she is doing well, no acute respiratory complaints. Compliant with Trelegy and has noticed a significant improvement in her breathing since starting this medication. Reports that she rarely needs to use her albuterol rescue inhaler. She uses her nebulizer when its very hot or humid outside, she has only needed to use this once in the last several months. She is still smoking, she was previously smoking 2ppd and has cut down to 1/2 ppd. It is hard for her to quit because her husband smokes. She is not a candidate for chantix d/t hx MDD. Her goal is to get down to 5 cigarettes and she will then consider a quit date. She will use nicotine patches to help assist her when she is ready.    Allergies  Allergen Reactions  . Other     "Banana Bag" given to patient at physician's office, severe back pain, SOB    Immunization History  Administered Date(s) Administered  . Influenza,inj,Quad PF,6+ Mos 08/07/2017, 07/27/2018, 07/04/2019  . PFIZER SARS-COV-2 Vaccination 01/30/2020, 02/21/2020  . Pneumococcal Conjugate-13 07/27/2018  . Pneumococcal Polysaccharide-23 03/22/2014, 08/07/2017  . Tdap 08/07/2017    Past Medical History:  Diagnosis Date  . Abdominal pain   . Anemia   . Anxiety   . Bronchitis   . COPD (chronic obstructive pulmonary disease) ([place])   . Depression    bipolar  . Diarrhea   . Emphysema   . GERD (gastroesophageal reflux disease)   . History of blood transfusion   . Splenomegaly    r/t  low platelets  . Thrombocytopenia ([place])   . Tobacco abuse     Tobacco History: Social History   Tobacco Use  Smoking Status Current Every Day Smoker  . Packs/day: 0.75  . Years: 30.00  . Pack years: 22.50  . Types: Cigarettes  Smokeless Tobacco Never Used  Tobacco Comment   tobacco info given 10/28/2019   Ready to quit: Not Answered Counseling given: Not Answered Comment: tobacco info given 10/28/2019   Outpatient Medications Prior to  Visit  Medication Sig Dispense Refill  . Accu-Chek Softclix Lancets lancets Use as instructed 100 each 12  . acetaminophen (TYLENOL) 500 MG tablet Take 1,000 mg by mouth daily as needed for moderate pain.    [name] albuterol (PROAIR HFA) 108 (90 Base) MCG/ACT inhaler Inhale 2 puffs into the lungs every 6 (six) hours as needed for wheezing or shortness of breath. 17 g 3  . albuterol (PROVENTIL) (2.5 MG/3ML) 0.083% nebulizer solution Take 3 mLs (2.5 mg total) by nebulization every 4 (four) hours as needed for wheezing or shortness of breath. 75 mL 5  . Blood Glucose Monitoring Suppl (ACCU-CHEK AVIVA PLUS) w/Device KIT 1 each by Does not apply route 2 (two) times daily as needed. 1 kit 0  . cholecalciferol (VITAMIN D3) 25 MCG (1000 UT) tablet Take 5,000 Units by mouth daily.    . colestipol (COLESTID) 1 g tablet Take 1 tablet (1 g total) by mouth as directed. 3 tablets every am and 2 tablets every pm 150 tablet 1  . Copper Gluconate 2 MG CAPS Take 4 mg by mouth daily.    . Cyanocobalamin (VITAMIN B-12 IJ) Inject 1 mg as directed as directed. Every 3 months    . diclofenac sodium (VOLTAREN) 1 % GEL Apply 2 g topically 4 (four) times daily. 2 g 3  . dicyclomine (BENTYL) 20 MG tablet Take 1 tablet (20 mg total) by mouth as needed for spasms. 120 tablet 1  . esomeprazole (NEXIUM) 40 MG capsule TAKE 1 CAPSULE(40 MG) BY MOUTH TWICE DAILY BEFORE A MEAL 60 capsule 3  . famotidine (PEPCID) 40 MG tablet Take 1 tablet (40 mg total) by mouth 2 (two) times daily. 60 tablet 3  . folic acid (FOLVITE) 1 MG tablet TAKE 1 TABLET(1 MG) BY MOUTH DAILY 90 tablet 2  . glucose blood (ACCU-CHEK AVIVA PLUS) test strip Use as instructed 100 each 12  . meloxicam (MOBIC) 15 MG tablet Take 1 tablet (15 mg total) by mouth daily. 30 tablet 1  . mirabegron ER (MYRBETRIQ) 25 MG TB24 tablet Take 1 tablet (25 mg total) by mouth daily. 30 tablet 5  . riTUXimab-pvvr (RUXIENCE) 100 MG/10ML injection Inject into the vein as directed. Infusion      . solifenacin (VESICARE) 10 MG tablet Take 1 tablet (10 mg total) by mouth daily. 30 tablet 5  . traZODone (DESYREL) 150 MG tablet Take 1 tablet (150 mg total) by mouth at bedtime. For sleep (Patient taking differently: Take 300 mg by mouth at bedtime. For sleep) 30 tablet 0  . venlafaxine XR (EFFEXOR-XR) 75 MG 24 hr capsule Take 1 capsule (75 mg total) by mouth daily with breakfast. For depression 30 capsule 0  . Fluticasone-Umeclidin-Vilant (TRELEGY ELLIPTA) 100-62.5-25 MCG/INH AEPB Inhale 1 puff into the lungs daily. 60 each 1   No facility-administered medications prior to visit.   Review of Systems  Review of Systems  Constitutional: Negative.   Respiratory: Negative for cough, shortness of breath and wheezing.    Physical Exam  BP 102/68 (BP Location: Left Arm, Cuff Size: Large)   Pulse 66   Temp 98.2 F (36.8 C) (Oral)   Ht '5\' 6"'  (1.676 m)   Wt 237 lb 3.2 oz (107.6 kg)   LMP  (LMP Unknown)   SpO2 95%   BMI 38.29 kg/m  Physical Exam Constitutional:      Appearance: Normal appearance.  HENT:     Head: Normocephalic and atraumatic.     Mouth/Throat:     Mouth: Mucous membranes are moist.     Pharynx: Oropharynx is clear.  Cardiovascular:     Rate and Rhythm: Normal rate and regular rhythm.  Pulmonary:     Effort: Pulmonary effort is normal.     Breath sounds: Wheezing present.     Comments: Faint inspiratory wheezing t/o Skin:    General: Skin is warm and dry.  Neurological:     General: No focal deficit present.     Mental Status: She is alert and oriented to person, place, and time. Mental status is at baseline.  Psychiatric:        Mood and Affect: Mood normal.        Behavior: Behavior normal.        Thought Content: Thought content normal.        Judgment: Judgment normal.      Lab Results:  CBC    Component Value Date/Time   WBC 4.3 04/09/2020 0947   RBC 4.54 04/09/2020 0947   HGB 13.7 04/09/2020 0947   HGB 11.7 (L) 09/21/2018 1000   HGB 10.3  (L) 08/01/2018 1154   HCT 41.8 04/09/2020 0947   HCT 32.9 (L) 08/01/2018 1154   PLT 26 (L) 04/09/2020 0947   PLT 15 (L) 09/21/2018 1000   PLT 37 (LL) 08/01/2018 1154   MCV 92.1 04/09/2020 0947   MCV 83 08/01/2018 1154   MCH 30.2 04/09/2020 0947   MCHC 32.8 04/09/2020 0947   RDW 14.8 04/09/2020 0947   RDW 15.4 08/01/2018 1154   LYMPHSABS 0.6 (L) 04/09/2020 0947   LYMPHSABS 0.9 08/01/2018 1154   MONOABS 0.3 04/09/2020 0947   EOSABS 0.1 04/09/2020 0947   EOSABS 0.0 08/01/2018 1154   BASOSABS 0.1 04/09/2020 0947   BASOSABS 0.0 08/01/2018 1154    BMET    Component Value Date/Time   NA 142 04/09/2020 0947   NA 140 07/04/2019 1110   K 4.5 04/09/2020 0947   CL 105 04/09/2020 0947   CO2 29 04/09/2020 0947   GLUCOSE 92 04/09/2020 0947   BUN 13 04/09/2020 0947   BUN 13 07/04/2019 1110   CREATININE 0.73 04/09/2020 0947   CREATININE 0.78 03/05/2020 1039   CREATININE 0.59 08/07/2017 1410   CALCIUM 8.8 (L) 04/09/2020 0947   GFRNONAA >60 04/09/2020 0947   GFRNONAA >60 03/05/2020 1039   GFRNONAA 110 08/07/2017 1410   GFRAA >60 04/09/2020 0947   GFRAA >60 03/05/2020 1039   GFRAA 127 08/07/2017 1410    BNP No results found for: BNP  ProBNP    Component Value Date/Time   PROBNP 22.0 09/30/2013 1234    Imaging: No results found.   Assessment & Plan:   COPD (chronic obstructive pulmonary disease) ([facility]) - Stable interval, no acute respiratory complaints. Reports significant improvement in breathing since starting Trelegy Plan: - Continue Trelegy 100 one puff daily in the morning (rinse mouth after use) -Strongly encouraged smoking cessation, recommend taper down amount and pick a quit date. She is not a candidate for Chantix d/t  previous SI, she can use nicotine patches  Follow-up: - 6 months with Dr. Valeta Harms or APP (December 2021)    Martyn Ehrich, NP 04/10/2020

## 2020-04-13 ENCOUNTER — Telehealth: Payer: Self-pay | Admitting: Hematology and Oncology

## 2020-04-13 NOTE — Telephone Encounter (Signed)
Scheduled per 6/24 sch msg. Called and spoke with pt, confirmed 7/15 appt

## 2020-04-14 ENCOUNTER — Other Ambulatory Visit: Payer: Self-pay | Admitting: Gastroenterology

## 2020-04-17 ENCOUNTER — Other Ambulatory Visit: Payer: Self-pay

## 2020-04-17 ENCOUNTER — Inpatient Hospital Stay: Payer: Medicaid Other | Attending: Hematology and Oncology

## 2020-04-17 VITALS — BP 104/66 | HR 57 | Temp 98.7°F | Resp 16

## 2020-04-17 DIAGNOSIS — Z79899 Other long term (current) drug therapy: Secondary | ICD-10-CM | POA: Diagnosis not present

## 2020-04-17 DIAGNOSIS — D469 Myelodysplastic syndrome, unspecified: Secondary | ICD-10-CM | POA: Insufficient documentation

## 2020-04-17 DIAGNOSIS — D693 Immune thrombocytopenic purpura: Secondary | ICD-10-CM | POA: Diagnosis not present

## 2020-04-17 DIAGNOSIS — M47816 Spondylosis without myelopathy or radiculopathy, lumbar region: Secondary | ICD-10-CM | POA: Diagnosis not present

## 2020-04-17 DIAGNOSIS — G8929 Other chronic pain: Secondary | ICD-10-CM | POA: Insufficient documentation

## 2020-04-17 DIAGNOSIS — D61818 Other pancytopenia: Secondary | ICD-10-CM | POA: Insufficient documentation

## 2020-04-17 DIAGNOSIS — Z9884 Bariatric surgery status: Secondary | ICD-10-CM | POA: Diagnosis not present

## 2020-04-17 MED ORDER — ACETAMINOPHEN 325 MG PO TABS
650.0000 mg | ORAL_TABLET | Freq: Once | ORAL | Status: AC
  Administered 2020-04-17: 650 mg via ORAL

## 2020-04-17 MED ORDER — ACETAMINOPHEN 325 MG PO TABS
ORAL_TABLET | ORAL | Status: AC
  Filled 2020-04-17: qty 2

## 2020-04-17 MED ORDER — SODIUM CHLORIDE 0.9 % IV SOLN
375.0000 mg/m2 | Freq: Once | INTRAVENOUS | Status: AC
  Administered 2020-04-17: 800 mg via INTRAVENOUS
  Filled 2020-04-17: qty 50

## 2020-04-17 MED ORDER — SODIUM CHLORIDE 0.9 % IV SOLN
Freq: Once | INTRAVENOUS | Status: AC
  Filled 2020-04-17: qty 250

## 2020-04-17 MED ORDER — DIPHENHYDRAMINE HCL 25 MG PO CAPS
ORAL_CAPSULE | ORAL | Status: AC
  Filled 2020-04-17: qty 2

## 2020-04-17 MED ORDER — DIPHENHYDRAMINE HCL 25 MG PO CAPS
50.0000 mg | ORAL_CAPSULE | Freq: Once | ORAL | Status: AC
  Administered 2020-04-17: 50 mg via ORAL

## 2020-04-17 MED ORDER — ONDANSETRON HCL 4 MG/2ML IJ SOLN
8.0000 mg | Freq: Once | INTRAMUSCULAR | Status: AC
  Administered 2020-04-17: 8 mg via INTRAVENOUS

## 2020-04-17 MED ORDER — ONDANSETRON HCL 4 MG/2ML IJ SOLN
INTRAMUSCULAR | Status: AC
  Filled 2020-04-17: qty 4

## 2020-04-17 MED ORDER — SODIUM CHLORIDE 0.9 % IV SOLN
10.0000 mg | Freq: Once | INTRAVENOUS | Status: AC
  Administered 2020-04-17: 10 mg via INTRAVENOUS
  Filled 2020-04-17: qty 10

## 2020-04-17 NOTE — Patient Instructions (Signed)
[facility] Discharge Instructions for Patients Receiving Chemotherapy  Today you received the following chemotherapy agents:  Rituxan.  To help prevent nausea and vomiting after your treatment, we encourage you to take your nausea medication as directed.   If you develop nausea and vomiting that is not controlled by your nausea medication, call the clinic.   BELOW ARE SYMPTOMS THAT SHOULD BE REPORTED IMMEDIATELY:  *FEVER GREATER THAN 100.5 F  *CHILLS WITH OR WITHOUT FEVER  NAUSEA AND VOMITING THAT IS NOT CONTROLLED WITH YOUR NAUSEA MEDICATION  *UNUSUAL SHORTNESS OF BREATH  *UNUSUAL BRUISING OR BLEEDING  TENDERNESS IN MOUTH AND THROAT WITH OR WITHOUT PRESENCE OF ULCERS  *URINARY PROBLEMS  *BOWEL PROBLEMS  UNUSUAL RASH Items with * indicate a potential emergency and should be followed up as soon as possible.  Feel free to call the clinic should you have any questions or concerns. The clinic phone number is [phone].  Please show the CHEMO ALERT CARD at check-in to the Emergency Department and triage nurse.   

## 2020-04-19 NOTE — Progress Notes (Signed)
PCCM: Thanks for seeing her Darvin Neighbours [facility] Pulmonary Critical Care 04/19/2020 6:09 PM

## 2020-04-29 ENCOUNTER — Other Ambulatory Visit: Payer: Self-pay | Admitting: Nurse Practitioner

## 2020-04-29 DIAGNOSIS — M545 Low back pain, unspecified: Secondary | ICD-10-CM

## 2020-04-29 DIAGNOSIS — G8929 Other chronic pain: Secondary | ICD-10-CM

## 2020-04-30 ENCOUNTER — Encounter: Payer: Self-pay | Admitting: Hematology and Oncology

## 2020-04-30 ENCOUNTER — Inpatient Hospital Stay (HOSPITAL_BASED_OUTPATIENT_CLINIC_OR_DEPARTMENT_OTHER): Payer: Medicaid Other | Admitting: Hematology and Oncology

## 2020-04-30 ENCOUNTER — Inpatient Hospital Stay: Payer: Medicaid Other

## 2020-04-30 ENCOUNTER — Other Ambulatory Visit: Payer: Self-pay

## 2020-04-30 VITALS — BP 112/70 | HR 71 | Temp 98.9°F | Resp 18 | Ht 66.0 in | Wt 242.2 lb

## 2020-04-30 DIAGNOSIS — D4620 Refractory anemia with excess of blasts, unspecified: Secondary | ICD-10-CM | POA: Diagnosis not present

## 2020-04-30 DIAGNOSIS — D693 Immune thrombocytopenic purpura: Secondary | ICD-10-CM | POA: Diagnosis not present

## 2020-04-30 DIAGNOSIS — D61818 Other pancytopenia: Secondary | ICD-10-CM

## 2020-04-30 LAB — CBC WITH DIFFERENTIAL/PLATELET
Abs Immature Granulocytes: 0.03 10*3/uL (ref 0.00–0.07)
Basophils Absolute: 0.1 10*3/uL (ref 0.0–0.1)
Basophils Relative: 2 %
Eosinophils Absolute: 0.1 10*3/uL (ref 0.0–0.5)
Eosinophils Relative: 1 %
HCT: 41.1 % (ref 36.0–46.0)
Hemoglobin: 13 g/dL (ref 12.0–15.0)
Immature Granulocytes: 1 %
Lymphocytes Relative: 16 %
Lymphs Abs: 0.6 10*3/uL — ABNORMAL LOW (ref 0.7–4.0)
MCH: 29.5 pg (ref 26.0–34.0)
MCHC: 31.6 g/dL (ref 30.0–36.0)
MCV: 93.2 fL (ref 80.0–100.0)
Monocytes Absolute: 0.3 10*3/uL (ref 0.1–1.0)
Monocytes Relative: 8 %
Neutro Abs: 2.9 10*3/uL (ref 1.7–7.7)
Neutrophils Relative %: 72 %
Platelets: 28 10*3/uL — ABNORMAL LOW (ref 150–400)
RBC: 4.41 MIL/uL (ref 3.87–5.11)
RDW: 15.2 % (ref 11.5–15.5)
WBC: 4 10*3/uL (ref 4.0–10.5)
nRBC: 0.5 % — ABNORMAL HIGH (ref 0.0–0.2)

## 2020-04-30 LAB — COMPREHENSIVE METABOLIC PANEL
ALT: 14 U/L (ref 0–44)
AST: 18 U/L (ref 15–41)
Albumin: 3.4 g/dL — ABNORMAL LOW (ref 3.5–5.0)
Alkaline Phosphatase: 68 U/L (ref 38–126)
Anion gap: 10 (ref 5–15)
BUN: 10 mg/dL (ref 6–20)
CO2: 27 mmol/L (ref 22–32)
Calcium: 8.5 mg/dL — ABNORMAL LOW (ref 8.9–10.3)
Chloride: 107 mmol/L (ref 98–111)
Creatinine, Ser: 0.8 mg/dL (ref 0.44–1.00)
GFR calc Af Amer: >60 mL/min (ref >60)
GFR calc non Af Amer: >60 mL/min (ref >60)
Glucose, Bld: 97 mg/dL (ref 70–99)
Potassium: 3.7 mmol/L (ref 3.5–5.1)
Sodium: 144 mmol/L (ref 135–145)
Total Bilirubin: 0.6 mg/dL (ref 0.3–1.2)
Total Protein: 5.7 g/dL — ABNORMAL LOW (ref 6.5–8.1)

## 2020-04-30 MED ORDER — MORPHINE SULFATE 15 MG PO TABS: 15.0000 mg | ORAL_TABLET | ORAL | 0 refills | Status: DC | PRN

## 2020-04-30 NOTE — Assessment & Plan Note (Signed)
Her original bone marrow biopsy from 2019 suggested possible myelodysplastic syndrome, however, she was also found to have multiple nutritional deficiency secondary to her gastric bypass surgery Despite adequate mineral replacement therapy and resolution of leukopenia and anemia, she remain thrombocytopenic We discussed the risk and benefits of pursuing repeat bone marrow aspirate and biopsy and she is in agreement to proceed I will see her back within a week after that to discuss test results

## 2020-04-30 NOTE — Assessment & Plan Note (Addendum)
Unfortunately, she has no response to rituximab She bruises easily We discussed the risk and benefits of bone marrow aspirate and biopsy The next step would be to prescribe either Nplate or Promacta but we discussed the importance of getting a baseline bone marrow biopsy before we proceed She is in agreement She does not need transfusion support right now We discussed the risk and benefits of sedated on nonsedated bone marrow biopsy and she would like to be sedated She has severe bone pain after the bone marrow biopsy and requested pain medicine coverage I will write her a small prescription of morphine sulfate I warned her about side effects

## 2020-04-30 NOTE — Progress Notes (Signed)
[place] OFFICE PROGRESS NOTE  Patient Care Team: [name], NP as PCP - General (Adult Health Nurse Practitioner)  ASSESSMENT & PLAN:  Chronic ITP (idiopathic thrombocytopenia) (HCC) Unfortunately, she has no response to rituximab She bruises easily We discussed the risk and benefits of bone marrow aspirate and biopsy The next step would be to prescribe either Nplate or Promacta but we discussed the importance of getting a baseline bone marrow biopsy before we proceed She is in agreement She does not need transfusion support right now We discussed the risk and benefits of sedated on nonsedated bone marrow biopsy and she would like to be sedated She has severe bone pain after the bone marrow biopsy and requested pain medicine coverage I will write her a small prescription of morphine sulfate I warned her about side effects  MDS (myelodysplastic syndrome), low grade ([place]) Her original bone marrow biopsy from 2019 suggested possible myelodysplastic syndrome, however, she was also found to have multiple nutritional deficiency secondary to her gastric bypass surgery Despite adequate mineral replacement therapy and resolution of leukopenia and anemia, she remain thrombocytopenic We discussed the risk and benefits of pursuing repeat bone marrow aspirate and biopsy and she is in agreement to proceed I will see her back within a week after that to discuss test results   Orders Placed This Encounter  Procedures  . CT BIOPSY    Standing Status:   Future    Standing Expiration Date:   04/30/2021    Order Specific Question:   Reason for Exam (SYMPTOM  OR DIAGNOSIS REQUIRED)    Answer:   Ct biopsy for refractory ITP, probably MDS    Order Specific Question:   Preferred imaging location?    Answer:   [facility]    Order Specific Question:   Is the patient pregnant?    Answer:   No  . CT BONE MARROW BIOPSY & ASPIRATION    Standing Status:   Future    Standing  Expiration Date:   04/30/2021    Order Specific Question:   Reason for Exam (SYMPTOM  OR DIAGNOSIS REQUIRED)    Answer:   Ct biopsy for refractory ITP, probably MDS    Order Specific Question:   Preferred imaging location?    Answer:   [facility]    Order Specific Question:   Radiology Contrast Protocol - do NOT remove file path    Answer:   [email or web address]    Order Specific Question:   Is patient pregnant?    Answer:   No    All questions were answered. The patient knows to call the clinic with any problems, questions or concerns. The total time spent in the appointment was 20 minutes encounter with patients including review of chart and various tests results, discussions about plan of care and coordination of care plan   Heath Lark, MD 04/30/2020 8:12 AM  INTERVAL HISTORY: Please see below for problem oriented charting. She returns for further follow-up for diagnosis of possible ITP and abnormal bone marrow biopsy in 2019 She bruises easily No recent infection, fever or chills The patient denies any recent signs or symptoms of bleeding such as spontaneous epistaxis, hematuria or hematochezia.  SUMMARY OF ONCOLOGIC HISTORY: Oncology History Overview Note  Cytogenetics showed 2 cell lines.  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  Starting around 2018, she was noted to have progressive mild pancytopenia.  Most recently, around October 2019, she is noted to have significant pancytopenia. She was referred to see hematologist for evaluation.  The patient has history of gastric bypass surgery.  She has significant recent weight loss. Her primary care doctor has ordered multiple mineral panel.  She was noted to have folate, B6, B12,  copper as well as iron deficiency.  She has received multiple mineral replacement therapy.  When she was seen here at the hematology clinic, she received high-dose vitamin B12 injection. On August 09, 2018, she underwent ultrasound of her abdomen which showed no evidence of splenomegaly.  The patient has significant smoking history but denies alcohol intake She denies significant recent infection requiring intravenous antibiotic treatment. Infectious work-up was negative. Between October to December 2019, she has received intravenous iron infusion along with vitamin B12 injection. On October 01, 2018, she underwent bone marrow aspirate and biopsy to evaluate for pancytopenia.  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  She was given vitamin B12 injection, oral copper and oral vitamin D replacement therapy   10/01/2018 Bone Marrow Biopsy   Bone Marrow, Aspirate,Biopsy, and Clot - SLIGHTLY HYPERCELLULAR BONE MARROW FOR AGE WITH DYSPOIETIC CHANGES. - SEE COMMENT. PERIPHERAL BLOOD: - PANCYTOPENIA. Diagnosis Note The bone marrow is slightly hypercellular for age with trilineage hematopoiesis but with relative abundance of erythroid precursors. This is associated with dyspoietic changes variably involving myeloid cell lines but with no increase in blastic cells The differential diagnosis includes secondary changes due to nutritional deficiency, medication, infection, autoimmune disease, toxins, etc. as well as a primary clonal myelodysplastic process. Correlation with cytogenetic and FISH studies is recommended. (BNS:kh 10/02/18)     REVIEW OF SYSTEMS:   Constitutional: Denies fevers, chills or abnormal weight loss Eyes: Denies blurriness of vision Ears, nose, mouth, throat, and face: Denies mucositis or sore throat Respiratory: Denies cough, dyspnea or wheezes Cardiovascular: Denies palpitation, chest  discomfort or lower extremity swelling Gastrointestinal:  Denies nausea, heartburn or change in bowel habits Skin: Denies abnormal skin rashes Lymphatics: Denies new lymphadenopathy Neurological:Denies numbness, tingling or new weaknesses Behavioral/Psych: Mood is stable, no new changes  All other systems were reviewed with the patient and are negative.  I have reviewed the past medical history, past surgical history, social history and family history with the patient and they are unchanged from previous note.  ALLERGIES:  is allergic to other.  MEDICATIONS:  Current Outpatient Medications  Medication Sig Dispense Refill  . Accu-Chek Softclix Lancets lancets Use as instructed 100 each 12  . acetaminophen (TYLENOL) 500 MG tablet Take 1,000 mg by mouth daily as needed for moderate pain.    [name] albuterol (PROAIR HFA) 108 (90 Base) MCG/ACT inhaler Inhale 2 puffs into the lungs every 6 (six) hours as needed for wheezing or shortness of breath. 17 g 3  . albuterol (PROVENTIL) (2.5 MG/3ML) 0.083% nebulizer solution Take 3 mLs (2.5 mg total) by nebulization every 4 (four) hours as needed for wheezing or shortness of breath. 75 mL 5  . Blood Glucose Monitoring Suppl (ACCU-CHEK AVIVA PLUS) w/Device KIT 1 each by Does not apply route 2 (two) times daily as needed. 1 kit 0  . cholecalciferol (VITAMIN D3) 25 MCG (1000 UT) tablet Take 5,000 Units by mouth daily.    [name]  colestipol (COLESTID) 1 g tablet Take 1 tablet (1 g total) by mouth as directed. 3 tablets every am and 2 tablets every pm 150 tablet 1  . Copper Gluconate 2 MG CAPS Take 4 mg by mouth daily.    . Cyanocobalamin (VITAMIN B-12 IJ) Inject 1 mg as directed as directed. Every 3 months    . diclofenac sodium (VOLTAREN) 1 % GEL Apply 2 g topically 4 (four) times daily. 2 g 3  . dicyclomine (BENTYL) 20 MG tablet Take 1 tablet (20 mg total) by mouth as needed for spasms. 120 tablet 1  . esomeprazole (NEXIUM) 40 MG capsule TAKE 1 CAPSULE(40 MG) BY  MOUTH TWICE DAILY BEFORE A MEAL 60 capsule 3  . famotidine (PEPCID) 40 MG tablet TAKE 1 TABLET(40 MG) BY MOUTH TWICE DAILY 60 tablet 3  . Fluticasone-Umeclidin-Vilant (TRELEGY ELLIPTA) 100-62.5-25 MCG/INH AEPB Inhale 1 puff into the lungs daily. 60 each 5  . folic acid (FOLVITE) 1 MG tablet TAKE 1 TABLET(1 MG) BY MOUTH DAILY 90 tablet 2  . glucose blood (ACCU-CHEK AVIVA PLUS) test strip Use as instructed 100 each 12  . mirabegron ER (MYRBETRIQ) 25 MG TB24 tablet Take 1 tablet (25 mg total) by mouth daily. 30 tablet 5  . morphine (MSIR) 15 MG tablet Take 1 tablet (15 mg total) by mouth every 4 (four) hours as needed for severe pain. 20 tablet 0  . solifenacin (VESICARE) 10 MG tablet Take 1 tablet (10 mg total) by mouth daily. 30 tablet 5  . traZODone (DESYREL) 150 MG tablet Take 1 tablet (150 mg total) by mouth at bedtime. For sleep (Patient taking differently: Take 300 mg by mouth at bedtime. For sleep) 30 tablet 0  . venlafaxine XR (EFFEXOR-XR) 75 MG 24 hr capsule Take 1 capsule (75 mg total) by mouth daily with breakfast. For depression 30 capsule 0   No current facility-administered medications for this visit.    PHYSICAL EXAMINATION: ECOG PERFORMANCE STATUS: 1 - Symptomatic but completely ambulatory  Vitals:   04/30/20 0803  BP: 112/70  Pulse: 71  Resp: 18  Temp: 98.9 F (37.2 C)  SpO2: 97%   Filed Weights   04/30/20 0803  Weight: 242 lb 3.2 oz (109.9 kg)    GENERAL:alert, no distress and comfortable SKIN: Noted multiple skin bruises NEURO: alert & oriented x 3 with fluent speech, no focal motor/sensory deficits  LABORATORY DATA:  I have reviewed the data as listed    Component Value Date/Time   NA 144 04/30/2020 0723   NA 140 07/04/2019 1110   K 3.7 04/30/2020 0723   CL 107 04/30/2020 0723   CO2 27 04/30/2020 0723   GLUCOSE 97 04/30/2020 0723   BUN 10 04/30/2020 0723   BUN 13 07/04/2019 1110   CREATININE 0.80 04/30/2020 0723   CREATININE 0.78 03/05/2020 1039    CREATININE 0.59 08/07/2017 1410   CALCIUM 8.5 (L) 04/30/2020 0723   PROT 5.7 (L) 04/30/2020 0723   PROT 6.2 07/04/2019 1110   ALBUMIN 3.4 (L) 04/30/2020 0723   ALBUMIN 4.5 07/04/2019 1110   AST 18 04/30/2020 0723   ALT 14 04/30/2020 0723   ALKPHOS 68 04/30/2020 0723   BILITOT 0.6 04/30/2020 0723   BILITOT 0.4 07/04/2019 1110   GFRNONAA >60 04/30/2020 0723   GFRNONAA >60 03/05/2020 1039   GFRNONAA 110 08/07/2017 1410   GFRAA >60 04/30/2020 0723   GFRAA >60 03/05/2020 1039   GFRAA 127 08/07/2017 1410    No results found for: SPEP, UPEP  Lab Results  Component Value Date   WBC 4.0 04/30/2020   NEUTROABS 2.9 04/30/2020   HGB 13.0 04/30/2020   HCT 41.1 04/30/2020   MCV 93.2 04/30/2020   PLT 28 (L) 04/30/2020      Chemistry      Component Value Date/Time   NA 144 04/30/2020 0723   NA 140 07/04/2019 1110   K 3.7 04/30/2020 0723   CL 107 04/30/2020 0723   CO2 27 04/30/2020 0723   BUN 10 04/30/2020 0723   BUN 13 07/04/2019 1110   CREATININE 0.80 04/30/2020 0723   CREATININE 0.78 03/05/2020 1039   CREATININE 0.59 08/07/2017 1410      Component Value Date/Time   CALCIUM 8.5 (L) 04/30/2020 0723   ALKPHOS 68 04/30/2020 0723   AST 18 04/30/2020 0723   ALT 14 04/30/2020 0723   BILITOT 0.6 04/30/2020 0723   BILITOT 0.4 07/04/2019 1110       RADIOGRAPHIC STUDIES: I have personally reviewed the radiological images as listed and agreed with the findings in the report. DG Lumbar Spine Complete W/Bend  Result Date: 04/10/2020 CLINICAL DATA:  Chronic midline LOWER back pain.  No sciatica. EXAM: LUMBAR SPINE - COMPLETE WITH BENDING VIEWS COMPARISON:  None. FINDINGS: Normal alignment of the lumbar spine. There is disc height loss at L4-5 and L5-S1, associated with uncovertebral spurring. No acute fracture or traumatic subluxation. There is-chronic calcification of the abdominal aorta. Bowel gas pattern is nonobstructive. Surgical clips are present in the RIGHT UPPER QUADRANT.  IMPRESSION: 1. 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 2. No evidence for acute abnormality. Electronically Signed   By: Nolon Nations M.D.   On: 04/10/2020 16:16   DG Shoulder Right  Result Date: 04/10/2020 CLINICAL DATA:  Chronic pain in the RIGHT shoulder. EXAM: RIGHT SHOULDER - 2+ VIEW COMPARISON:  None. FINDINGS: There is mild subacromial narrowing. No acute fracture or subluxation. RIGHT lung apex is unremarkable. IMPRESSION: Mild subacromial narrowing.  No evidence for acute  abnormality. Electronically Signed   By: Nolon Nations M.D.   On: 04/10/2020 16:15

## 2020-05-01 ENCOUNTER — Telehealth: Payer: Self-pay | Admitting: Hematology and Oncology

## 2020-05-01 NOTE — Telephone Encounter (Signed)
Per 7/16 los, no changes made to pt schedule   

## 2020-05-07 ENCOUNTER — Encounter: Payer: Self-pay | Admitting: Nurse Practitioner

## 2020-05-07 ENCOUNTER — Ambulatory Visit (INDEPENDENT_AMBULATORY_CARE_PROVIDER_SITE_OTHER): Payer: Medicaid Other | Admitting: Nurse Practitioner

## 2020-05-07 ENCOUNTER — Other Ambulatory Visit: Payer: Self-pay

## 2020-05-07 VITALS — BP 121/75 | HR 80 | Temp 98.1°F | Resp 17 | Ht 66.0 in | Wt 237.4 lb

## 2020-05-07 DIAGNOSIS — M545 Low back pain: Secondary | ICD-10-CM

## 2020-05-07 DIAGNOSIS — E162 Hypoglycemia, unspecified: Secondary | ICD-10-CM | POA: Diagnosis not present

## 2020-05-07 DIAGNOSIS — G8929 Other chronic pain: Secondary | ICD-10-CM

## 2020-05-07 LAB — GLUCOSE, POCT (MANUAL RESULT ENTRY): POC Glucose: 97 mg/dl (ref 70–99)

## 2020-05-07 MED ORDER — METHYLPREDNISOLONE 4 MG PO TBPK: ORAL_TABLET | ORAL | 0 refills | Status: AC

## 2020-05-07 MED ORDER — KETOROLAC TROMETHAMINE 60 MG/2ML IM SOLN
60.0000 mg | Freq: Once | INTRAMUSCULAR | Status: AC
Start: 2020-05-07 — End: 2020-05-07
  Administered 2020-05-07: 60 mg via INTRAMUSCULAR

## 2020-05-07 NOTE — Patient Instructions (Signed)

## 2020-05-07 NOTE — Progress Notes (Signed)
[place],   [address] Phone:  [phone]   Fax:  [phone]   Acute Office Visit  Subjective:    Patient ID: Barbara Stanley, female    DOB: 1971-02-10, 49 y.o.   MRN: [id number]  Chief Complaint  Patient presents with   Back Pain    Pt states she is here for a f/u on intense pain in her back. X79yr. Pt states depending on what she is doing determine what she is doing.     HPI Patient is in today for low back pain. She  has a past medical history of Abdominal pain, Anemia, Anxiety, Bronchitis, COPD (chronic obstructive pulmonary disease) ([place], Depression, Diarrhea, Emphysema, GERD (gastroesophageal reflux disease), History of blood transfusion, Splenomegaly, Thrombocytopenia ([place], and Tobacco abuse.   Low Back Pain Patient complains of chronic low back pain. This is evaluated as a personal injury. The patient first noted symptoms several weeks ago. It was related to a remote injury. The pain is rated 5 /10, and is located at the across the lower back L>R. The pain is described as aching and sharp and occurs intermittently. The symptoms has been progressive. Symptoms are exacerbated by extension and flexion. Factors which relieve the pain include nothing. Other associated symptoms include no other symptoms. Previous history of symptoms: the problem is long-standing.  Treatment efforts have included rest, OTC NSAIDS and acetaminophen, without relief.   Past Medical History:  Diagnosis Date   Abdominal pain    Anemia    Anxiety    Bronchitis    COPD (chronic obstructive pulmonary disease) (HCC)    Depression    bipolar   Diarrhea    Emphysema    GERD (gastroesophageal reflux disease)    History of blood transfusion    Splenomegaly    r/t low platelets   Thrombocytopenia (HCC)    Tobacco abuse     Past Surgical History:  Procedure Laterality Date   ABDOMINAL HYSTERECTOMY     c section x 3       CHOLECYSTECTOMY     GASTRIC BYPASS     UPPER GASTROINTESTINAL ENDOSCOPY     WISDOM TOOTH EXTRACTION      Family History  Problem Relation Age of Onset   Diabetes Mother    Diabetes Other    Hypertension Other    CAD Other    Heart disease Father    Colon cancer Neg Hx    Rectal cancer Neg Hx    Stomach cancer Neg Hx    Esophageal cancer Neg Hx     Social History   Socioeconomic History   Marital status: Married    Spouse name: Not on file   Number of children: 3   Years of education: Not on file   Highest education level: Not on file  Occupational History   Occupation: disabled  Tobacco Use   Smoking status: Current Every Day Smoker    Packs/day: 0.75    Years: 30.00    Pack years: 22.50    Types: Cigarettes   Smokeless tobacco: Never Used   Tobacco comment: tobacco info given 10/28/2019  Vaping Use   Vaping Use: Never used  Substance and Sexual Activity   Alcohol use: Yes    Comment: occasional wine   Drug use: No   Sexual activity: Yes    Birth control/protection: None    Comment: Hysterectomy  Other Topics Concern   Not on file  Social History  Narrative   Not on file   Social Determinants of Health   Financial Resource Strain:    Difficulty of Paying Living Expenses:   Food Insecurity:    Worried About Charity fundraiser in the Last Year:    Arboriculturist in the Last Year:   Transportation Needs:    Film/video editor (Medical):    Lack of Transportation (Non-Medical):   Physical Activity:    Days of Exercise per Week:    Minutes of Exercise per Session:   Stress:    Feeling of Stress :   Social Connections:    Frequency of Communication with Friends and Family:    Frequency of Social Gatherings with Friends and Family:    Attends Religious Services:    Active Member of Clubs or Organizations:    Attends Archivist Meetings:    Marital Status:   Intimate Partner Violence:    Fear of  Current or Ex-Partner:    Emotionally Abused:    Physically Abused:    Sexually Abused:     Outpatient Medications Prior to Visit  Medication Sig Dispense Refill   Accu-Chek Softclix Lancets lancets Use as instructed 100 each 12   acetaminophen (TYLENOL) 500 MG tablet Take 1,000 mg by mouth daily as needed for moderate pain.     albuterol (PROVENTIL) (2.5 MG/3ML) 0.083% nebulizer solution Take 3 mLs (2.5 mg total) by nebulization every 4 (four) hours as needed for wheezing or shortness of breath. 75 mL 5   cholecalciferol (VITAMIN D3) 25 MCG (1000 UT) tablet Take 5,000 Units by mouth daily.     Copper Gluconate 2 MG CAPS Take 4 mg by mouth daily.     Cyanocobalamin (VITAMIN B-12 IJ) Inject 1 mg as directed as directed. Every 3 months     diclofenac sodium (VOLTAREN) 1 % GEL Apply 2 g topically 4 (four) times daily. 2 g 3   dicyclomine (BENTYL) 20 MG tablet Take 1 tablet (20 mg total) by mouth as needed for spasms. 120 tablet 1   esomeprazole (NEXIUM) 40 MG capsule TAKE 1 CAPSULE(40 MG) BY MOUTH TWICE DAILY BEFORE A MEAL 60 capsule 3   famotidine (PEPCID) 40 MG tablet TAKE 1 TABLET(40 MG) BY MOUTH TWICE DAILY 60 tablet 3   Fluticasone-Umeclidin-Vilant (TRELEGY ELLIPTA) 100-62.5-25 MCG/INH AEPB Inhale 1 puff into the lungs daily. 60 each 5   folic acid (FOLVITE) 1 MG tablet TAKE 1 TABLET(1 MG) BY MOUTH DAILY 90 tablet 2   mirabegron ER (MYRBETRIQ) 25 MG TB24 tablet Take 1 tablet (25 mg total) by mouth daily. 30 tablet 5   solifenacin (VESICARE) 10 MG tablet Take 1 tablet (10 mg total) by mouth daily. 30 tablet 5   traZODone (DESYREL) 150 MG tablet Take 1 tablet (150 mg total) by mouth at bedtime. For sleep (Patient taking differently: Take 300 mg by mouth at bedtime. For sleep) 30 tablet 0   venlafaxine XR (EFFEXOR-XR) 75 MG 24 hr capsule Take 1 capsule (75 mg total) by mouth daily with breakfast. For depression 30 capsule 0   colestipol (COLESTID) 1 g tablet Take 1 tablet  (1 g total) by mouth as directed. 3 tablets every am and 2 tablets every pm 150 tablet 1   albuterol (PROAIR HFA) 108 (90 Base) MCG/ACT inhaler Inhale 2 puffs into the lungs every 6 (six) hours as needed for wheezing or shortness of breath. (Patient not taking: Reported on 05/07/2020) 17 g 3   Blood Glucose Monitoring  Suppl (ACCU-CHEK AVIVA PLUS) w/Device KIT 1 each by Does not apply route 2 (two) times daily as needed. (Patient not taking: Reported on 05/07/2020) 1 kit 0   glucose blood (ACCU-CHEK AVIVA PLUS) test strip Use as instructed (Patient not taking: Reported on 05/07/2020) 100 each 12   morphine (MSIR) 15 MG tablet Take 1 tablet (15 mg total) by mouth every 4 (four) hours as needed for severe pain. (Patient not taking: Reported on 05/07/2020) 20 tablet 0   No facility-administered medications prior to visit.    Allergies  Allergen Reactions   Other     "Banana Bag" given to patient at physician's office, severe back pain, SOB    Review of Systems  Constitutional: Negative.   HENT: Negative.   Eyes: Negative.   Respiratory: Negative.   Cardiovascular: Negative.   Gastrointestinal: Negative.   Endocrine: Negative.   Genitourinary: Negative.   Musculoskeletal: Positive for back pain.  Skin: Negative.   Allergic/Immunologic: Negative.   Neurological: Negative.   Hematological: Negative.   Psychiatric/Behavioral: Negative.        Objective:    Physical Exam Constitutional:      General: She is not in acute distress.    Appearance: She is obese. She is not ill-appearing or toxic-appearing.  HENT:     Head: Normocephalic and atraumatic.  Cardiovascular:     Rate and Rhythm: Normal rate.  Pulmonary:     Effort: Pulmonary effort is normal.  Musculoskeletal:     Cervical back: Normal range of motion.     Lumbar back: Tenderness and bony tenderness present. Decreased range of motion.  Skin:    General: Skin is warm and dry.  Neurological:     General: No focal  deficit present.     Mental Status: She is alert and oriented to person, place, and time.  Psychiatric:        Mood and Affect: Mood normal.        Behavior: Behavior normal.        Thought Content: Thought content normal.        Judgment: Judgment normal.     BP 121/75 (BP Location: Left Arm, Patient Position: Sitting, Cuff Size: Normal)    Pulse 80    Temp 98.1 F (36.7 C)    Resp 17    Ht _0  (1.676 m)    Wt 237 lb 6.4 oz (107.7 kg)    LMP  (LMP Unknown)    SpO2 96%    BMI 38.32 kg/m  Wt Readings from Last 3 Encounters:  05/07/20 237 lb 6.4 oz (107.7 kg)  04/30/20 242 lb 3.2 oz (109.9 kg)  04/10/20 237 lb 3.2 oz (107.6 kg)    There are no preventive care reminders to display for this patient.  There are no preventive care reminders to display for this patient.   Lab Results  Component Value Date   TSH 0.74 09/05/2018   Lab Results  Component Value Date   WBC 4.0 04/30/2020   HGB 13.0 04/30/2020   HCT 41.1 04/30/2020   MCV 93.2 04/30/2020   PLT 28 (L) 04/30/2020   Lab Results  Component Value Date   NA 144 04/30/2020   K 3.7 04/30/2020   CO2 27 04/30/2020   GLUCOSE 97 04/30/2020   BUN 10 04/30/2020   CREATININE 0.80 04/30/2020   BILITOT 0.6 04/30/2020   ALKPHOS 68 04/30/2020   AST 18 04/30/2020   ALT 14 04/30/2020   PROT 5.7 (L) 04/30/2020  ALBUMIN 3.4 (L) 04/30/2020   CALCIUM 8.5 (L) 04/30/2020   ANIONGAP 10 04/30/2020   GFR 93.62 12/25/2019   Lab Results  Component Value Date   CHOL 128 12/31/2019   Lab Results  Component Value Date   HDL 52 12/31/2019   Lab Results  Component Value Date   LDLCALC 63 12/31/2019   Lab Results  Component Value Date   TRIG 61 12/31/2019   Lab Results  Component Value Date   CHOLHDL 2.5 12/31/2019   Lab Results  Component Value Date   HGBA1C 5.9 (A) 04/01/2020   HGBA1C 5.9 04/01/2020   HGBA1C 5.9 04/01/2020   HGBA1C 5.9 04/01/2020       Assessment & Plan:   Problem List Items Addressed This Visit     None    Visit Diagnoses    Low blood sugar    -  Primary   Relevant Orders   POC Glucose (CBG) (Completed)   Chronic midline low back pain without sciatica       Relevant Medications   ketorolac (TORADOL) injection 60 mg   methylPREDNISolone (MEDROL) 4 MG TBPK tablet Encourage PT and weight loss  Will see if the above tx is effective if not referral to ortho/pain manangement for lumbar spine evaluation and treatment       Meds ordered this encounter  Medications   ketorolac (TORADOL) injection 60 mg   methylPREDNISolone (MEDROL) 4 MG TBPK tablet    Sig: Follow package instructions.    Dispense:  21 tablet    Refill:  0    Do not add to the "Automatic Refill" notification system.    Order Specific Question:   Supervising Provider    Answer:   Tresa Garter [[id number]]     [name], NP

## 2020-05-08 ENCOUNTER — Other Ambulatory Visit: Payer: Self-pay | Admitting: Student

## 2020-05-11 ENCOUNTER — Other Ambulatory Visit: Payer: Self-pay

## 2020-05-11 ENCOUNTER — Encounter (HOSPITAL_COMMUNITY): Payer: Self-pay

## 2020-05-11 ENCOUNTER — Ambulatory Visit (HOSPITAL_COMMUNITY)
Admission: RE | Admit: 2020-05-11 | Discharge: 2020-05-11 | Disposition: A | Discharge status: Home / Self Care | Payer: Medicaid Other | Source: Ambulatory Visit | Attending: Hematology and Oncology | Admitting: Hematology and Oncology

## 2020-05-11 DIAGNOSIS — Z79899 Other long term (current) drug therapy: Secondary | ICD-10-CM | POA: Insufficient documentation

## 2020-05-11 DIAGNOSIS — Z862 Personal history of diseases of the blood and blood-forming organs and certain disorders involving the immune mechanism: Secondary | ICD-10-CM | POA: Insufficient documentation

## 2020-05-11 DIAGNOSIS — F17210 Nicotine dependence, cigarettes, uncomplicated: Secondary | ICD-10-CM | POA: Diagnosis not present

## 2020-05-11 DIAGNOSIS — Z90710 Acquired absence of both cervix and uterus: Secondary | ICD-10-CM | POA: Diagnosis not present

## 2020-05-11 DIAGNOSIS — Z9049 Acquired absence of other specified parts of digestive tract: Secondary | ICD-10-CM | POA: Diagnosis not present

## 2020-05-11 DIAGNOSIS — K219 Gastro-esophageal reflux disease without esophagitis: Secondary | ICD-10-CM | POA: Diagnosis not present

## 2020-05-11 DIAGNOSIS — F419 Anxiety disorder, unspecified: Secondary | ICD-10-CM | POA: Diagnosis not present

## 2020-05-11 DIAGNOSIS — D4620 Refractory anemia with excess of blasts, unspecified: Secondary | ICD-10-CM

## 2020-05-11 DIAGNOSIS — J439 Emphysema, unspecified: Secondary | ICD-10-CM | POA: Diagnosis not present

## 2020-05-11 DIAGNOSIS — Z9884 Bariatric surgery status: Secondary | ICD-10-CM | POA: Diagnosis not present

## 2020-05-11 DIAGNOSIS — F319 Bipolar disorder, unspecified: Secondary | ICD-10-CM | POA: Insufficient documentation

## 2020-05-11 DIAGNOSIS — D693 Immune thrombocytopenic purpura: Secondary | ICD-10-CM

## 2020-05-11 LAB — CBC WITH DIFFERENTIAL/PLATELET
Abs Immature Granulocytes: 0.04 10*3/uL (ref 0.00–0.07)
Basophils Absolute: 0.1 10*3/uL (ref 0.0–0.1)
Basophils Relative: 1 %
Eosinophils Absolute: 0 10*3/uL (ref 0.0–0.5)
Eosinophils Relative: 1 %
HCT: 44.5 % (ref 36.0–46.0)
Hemoglobin: 14.8 g/dL (ref 12.0–15.0)
Immature Granulocytes: 1 %
Lymphocytes Relative: 17 %
Lymphs Abs: 0.9 10*3/uL (ref 0.7–4.0)
MCH: 30.6 pg (ref 26.0–34.0)
MCHC: 33.3 g/dL (ref 30.0–36.0)
MCV: 92.1 fL (ref 80.0–100.0)
Monocytes Absolute: 0.4 10*3/uL (ref 0.1–1.0)
Monocytes Relative: 8 %
Neutro Abs: 3.9 10*3/uL (ref 1.7–7.7)
Neutrophils Relative %: 72 %
Platelets: 37 10*3/uL — ABNORMAL LOW (ref 150–400)
RBC: 4.83 MIL/uL (ref 3.87–5.11)
RDW: 15.3 % (ref 11.5–15.5)
WBC: 5.3 10*3/uL (ref 4.0–10.5)
nRBC: 0.4 % — ABNORMAL HIGH (ref 0.0–0.2)

## 2020-05-11 MED ORDER — FLUMAZENIL 0.5 MG/5ML IV SOLN
INTRAVENOUS | Status: AC
  Filled 2020-05-11: qty 5

## 2020-05-11 MED ORDER — FENTANYL CITRATE (PF) 100 MCG/2ML IJ SOLN
INTRAMUSCULAR | Status: AC | PRN
  Administered 2020-05-11 (×4): 50 ug via INTRAVENOUS

## 2020-05-11 MED ORDER — MIDAZOLAM HCL 2 MG/2ML IJ SOLN
INTRAMUSCULAR | Status: AC | PRN
  Administered 2020-05-11 (×4): 1 mg via INTRAVENOUS

## 2020-05-11 MED ORDER — SODIUM CHLORIDE 0.9 % IV SOLN: INTRAVENOUS | Status: DC

## 2020-05-11 MED ORDER — MIDAZOLAM HCL 2 MG/2ML IJ SOLN
INTRAMUSCULAR | Status: AC
  Filled 2020-05-11: qty 4

## 2020-05-11 MED ORDER — FENTANYL CITRATE (PF) 100 MCG/2ML IJ SOLN
INTRAMUSCULAR | Status: AC
  Filled 2020-05-11: qty 4

## 2020-05-11 MED ORDER — LIDOCAINE HCL (PF) 1 % IJ SOLN
INTRAMUSCULAR | Status: AC | PRN
  Administered 2020-05-11: 10 mL via INTRADERMAL

## 2020-05-11 MED ORDER — NALOXONE HCL 0.4 MG/ML IJ SOLN
INTRAMUSCULAR | Status: AC
  Filled 2020-05-11: qty 1

## 2020-05-11 NOTE — Procedures (Signed)
Interventional Radiology Procedure Note  Procedure: CT guided bone marrow aspiration and biopsy  Complications: None  EBL: < 10 mL  Findings: Aspirate and core biopsy performed of bone marrow in right iliac bone.  Plan: Bedrest supine x 1 hrs  Aiya Keach T. Sae Handrich, M.D Pager:  [phone]   

## 2020-05-11 NOTE — Discharge Instructions (Addendum)
Bone Marrow Aspiration and Bone Marrow Biopsy, Adult, Care After This sheet gives you information about how to care for yourself after your procedure. Your health care provider may also give you more specific instructions. If you have problems or questions, contact your health care provider. What can I expect after the procedure? After the procedure, it is common to have:  Mild pain and tenderness.  Swelling.  Bruising. Follow these instructions at home: Puncture site care   Follow instructions from your health care provider about how to take care of the puncture site. Make sure you: ? Wash your hands with soap and water before and after you change your bandage (dressing). If soap and water are not available, use hand sanitizer. ? Change your dressing as told by your health care provider.  Check your puncture site every day for signs of infection. Check for: ? More redness, swelling, or pain. ? Fluid or blood. ? Warmth. ? Pus or a bad smell. Activity  Return to your normal activities as told by your health care provider. Ask your health care provider what activities are safe for you.  Do not lift anything that is heavier than 10 lb (4.5 kg), or the limit that you are told, until your health care provider says that it is safe.  Do not drive for 24 hours if you were given a sedative during your procedure. General instructions   Take over-the-counter and prescription medicines only as told by your health care provider.  Do not take baths, swim, or use a hot tub until your health care provider approves. Ask your health care provider if you may take showers. You may only be allowed to take sponge baths.  If directed, put ice on the affected area. To do this: ? Put ice in a plastic bag. ? Place a towel between your skin and the bag. ? Leave the ice on for 20 minutes, 2-3 times a day.  Keep all follow-up visits as told by your health care provider. This is important. Contact a  health care provider if:  Your pain is not controlled with medicine.  You have a fever.  You have more redness, swelling, or pain around the puncture site.  You have fluid or blood coming from the puncture site.  Your puncture site feels warm to the touch.  You have pus or a bad smell coming from the puncture site. Summary  After the procedure, it is common to have mild pain, tenderness, swelling, and bruising.  Follow instructions from your health care provider about how to take care of the puncture site and what activities are safe for you.  Take over-the-counter and prescription medicines only as told by your health care provider.  Contact a health care provider if you have any signs of infection, such as fluid or blood coming from the puncture site. This information is not intended to replace advice given to you by your health care provider. Make sure you discuss any questions you have with your health care provider. Document Revised: 02/19/2019 Document Reviewed: 02/19/2019 Elsevier Patient Education  [place]. Moderate Conscious Sedation, Adult, Care After These instructions provide you with information about caring for yourself after your procedure. Your health care provider may also give you more specific instructions. Your treatment has been planned according to current medical practices, but problems sometimes occur. Call your health care provider if you have any problems or questions after your procedure. What can I expect after the procedure? After your procedure,  it is common:  To feel sleepy for several hours.  To feel clumsy and have poor balance for several hours.  To have poor judgment for several hours.  To vomit if you eat too soon. Follow these instructions at home: For at least 24 hours after the procedure:   Do not: ? Participate in activities where you could fall or become injured. ? Drive. ? Use heavy machinery. ? Drink alcohol. ? Take  sleeping pills or medicines that cause drowsiness. ? Make important decisions or sign legal documents. ? Take care of children on your own.  Rest. Eating and drinking  Follow the diet recommended by your health care provider.  If you vomit: ? Drink water, juice, or soup when you can drink without vomiting. ? Make sure you have little or no nausea before eating solid foods. General instructions  Have a responsible adult stay with you until you are awake and alert.  Take over-the-counter and prescription medicines only as told by your health care provider.  If you smoke, do not smoke without supervision.  Keep all follow-up visits as told by your health care provider. This is important. Contact a health care provider if:  You keep feeling nauseous or you keep vomiting.  You feel light-headed.  You develop a rash.  You have a fever. Get help right away if:  You have trouble breathing. This information is not intended to replace advice given to you by your health care provider. Make sure you discuss any questions you have with your health care provider. Document Revised: 09/15/2017 Document Reviewed: 01/23/2016 Elsevier Patient Education  [employer].   IR [phone]

## 2020-05-11 NOTE — Consult Note (Signed)
Chief Complaint: Patient was seen in consultation today for CT-guided bone marrow biopsy   Referring Physician(s): Hoot Owl  Supervising Physician: Aletta Edouard  Patient Status: [facility] - Out-pt  History of Present Illness: Barbara Stanley is a 49 y.o. female smoker with history of refractory ITP who presents today for CT-guided bone marrow biopsy for further evaluation/rule out MDS.  Additional medical history as below.  Past Medical History:  Diagnosis Date  . Abdominal pain   . Anemia   . Anxiety   . Bronchitis   . COPD (chronic obstructive pulmonary disease) ([place])   . Depression    bipolar  . Diarrhea   . Emphysema   . GERD (gastroesophageal reflux disease)   . History of blood transfusion   . Splenomegaly    r/t low platelets  . Thrombocytopenia ([place])   . Tobacco abuse     Past Surgical History:  Procedure Laterality Date  . ABDOMINAL HYSTERECTOMY    . c section x 3     . CHOLECYSTECTOMY    . GASTRIC BYPASS    . UPPER GASTROINTESTINAL ENDOSCOPY    . WISDOM TOOTH EXTRACTION      Allergies: Other  Medications: Prior to Admission medications   Medication Sig Start Date End Date Taking? Authorizing Provider  Accu-Chek Softclix Lancets lancets Use as instructed 12/31/19  Yes Dorena Dew, FNP  acetaminophen (TYLENOL) 500 MG tablet Take 1,000 mg by mouth daily as needed for moderate pain.   Yes [provider]  cholecalciferol (VITAMIN D3) 25 MCG (1000 UT) tablet Take 5,000 Units by mouth daily.   Yes [provider]  Copper Gluconate 2 MG CAPS Take 4 mg by mouth daily.   Yes [provider]  dicyclomine (BENTYL) 20 MG tablet Take 1 tablet (20 mg total) by mouth as needed for spasms. 08/12/19  Yes Thornton Park, MD  esomeprazole (NEXIUM) 40 MG capsule TAKE 1 CAPSULE(40 MG) BY MOUTH TWICE DAILY BEFORE A MEAL 03/12/20  Yes Thornton Park, MD  famotidine (PEPCID) 40 MG tablet TAKE 1 TABLET(40 MG) BY MOUTH TWICE DAILY  04/15/20  Yes Thornton Park, MD  Fluticasone-Umeclidin-Vilant (TRELEGY ELLIPTA) 100-62.5-25 MCG/INH AEPB Inhale 1 puff into the lungs daily. 04/10/20  Yes Martyn Ehrich, NP  folic acid (FOLVITE) 1 MG tablet TAKE 1 TABLET(1 MG) BY MOUTH DAILY 03/26/20  Yes Heath Lark, MD  mirabegron ER (MYRBETRIQ) 25 MG TB24 tablet Take 1 tablet (25 mg total) by mouth daily. 03/23/20  Yes Chancy Milroy, MD  solifenacin (VESICARE) 10 MG tablet Take 1 tablet (10 mg total) by mouth daily. 02/05/20  Yes Chancy Milroy, MD  traZODone (DESYREL) 150 MG tablet Take 1 tablet (150 mg total) by mouth at bedtime. For sleep Patient taking differently: Take 300 mg by mouth at bedtime. For sleep 11/08/14  Yes Rankin, Shuvon B, NP  venlafaxine XR (EFFEXOR-XR) 75 MG 24 hr capsule Take 1 capsule (75 mg total) by mouth daily with breakfast. For depression 11/08/14  Yes Rankin, Shuvon B, NP  albuterol (PROVENTIL) (2.5 MG/3ML) 0.083% nebulizer solution Take 3 mLs (2.5 mg total) by nebulization every 4 (four) hours as needed for wheezing or shortness of breath. 05/30/19   Icard, Octavio Graves, DO  colestipol (COLESTID) 1 g tablet Take 1 tablet (1 g total) by mouth as directed. 3 tablets every am and 2 tablets every pm 03/25/20 04/24/20  Thornton Park, MD  Cyanocobalamin (VITAMIN B-12 IJ) Inject 1 mg as directed as directed. Every 3 months  [provider]  diclofenac sodium (VOLTAREN) 1 % GEL Apply 2 g topically 4 (four) times daily. 07/24/19   Azzie Glatter, FNP  methylPREDNISolone (MEDROL) 4 MG TBPK tablet Follow package instructions. 05/07/20 05/13/20  [name], NP     Family History  Problem Relation Age of Onset  . Diabetes Mother   . Diabetes Other   . Hypertension Other   . CAD Other   . Heart disease Father   . Colon cancer Neg Hx   . Rectal cancer Neg Hx   . Stomach cancer Neg Hx   . Esophageal cancer Neg Hx     Social History   Socioeconomic History  . Marital status: Married    Spouse name:  Not on file  . Number of children: 3  . Years of education: Not on file  . Highest education level: Not on file  Occupational History  . Occupation: disabled  Tobacco Use  . Smoking status: Current Every Day Smoker    Packs/day: 0.75    Years: 30.00    Pack years: 22.50    Types: Cigarettes  . Smokeless tobacco: Never Used  . Tobacco comment: tobacco info given 10/28/2019  Vaping Use  . Vaping Use: Never used  Substance and Sexual Activity  . Alcohol use: Yes    Comment: occasional wine  . Drug use: No  . Sexual activity: Yes    Birth control/protection: None    Comment: Hysterectomy  Other Topics Concern  . Not on file  Social History Narrative  . Not on file   Social Determinants of Health   Financial Resource Strain:   . Difficulty of Paying Living Expenses:   Food Insecurity:   . Worried About Charity fundraiser in the Last Year:   . Arboriculturist in the Last Year:   Transportation Needs:   . Film/video editor (Medical):   [name] Lack of Transportation (Non-Medical):   Physical Activity:   . Days of Exercise per Week:   . Minutes of Exercise per Session:   Stress:   . Feeling of Stress :   Social Connections:   . Frequency of Communication with Friends and Family:   . Frequency of Social Gatherings with Friends and Family:   . Attends Religious Services:   . Active Member of Clubs or Organizations:   . Attends Archivist Meetings:   [name] Marital Status:       Review of Systems currently denies fever, chest pain, cough, abdominal pain, nausea, vomiting or bleeding.  She does have occasional headaches, chronic dyspnea, low back pain, easy bruisability.  Vital Signs: BP 117/77   Pulse 72   Temp 98.3 F (36.8 C) (Oral)   Resp 18   Ht '5\' 6"'  (1.676 m)   Wt (!) 237 lb 7 oz (107.7 kg)   LMP  (LMP Unknown)   SpO2 93%   BMI 38.32 kg/m   Physical Exam awake, alert.  Chest with distant breath sounds bilaterally.  Heart with regular rate/ rhythm.   Abdomen soft, positive bowel sounds, nontender.  Extremities with full range of motion.  Imaging: No results found.  Labs:  CBC: Recent Labs    03/26/20 0756 04/09/20 0947 04/30/20 0723 05/11/20 0648  WBC 3.6* 4.3 4.0 5.3  HGB 14.3 13.7 13.0 14.8  HCT 42.8 41.8 41.1 44.5  PLT 26* 26* 28* 37*    COAGS: No results for input(s): INR, APTT in the last 8760 hours.  BMP: Recent Labs    03/05/20 1039 03/26/20 0756 04/09/20 0947 04/30/20 0723  NA 139 141 142 144  K 4.2 3.9 4.5 3.7  CL 102 106 105 107  CO2 '28 25 29 27  ' GLUCOSE 82 70 92 97  BUN '13 13 13 10  ' CALCIUM 8.5* 8.8* 8.8* 8.5*  CREATININE 0.78 0.73 0.73 0.80  GFRNONAA >60 >60 >60 >60  GFRAA >60 >60 >60 >60    LIVER FUNCTION TESTS: Recent Labs    09/04/19 1353 03/26/20 0756 04/09/20 0947 04/30/20 0723  BILITOT 0.3 0.7 0.7 0.6  AST 29 15 14* 18  ALT 39 '15 14 14  ' ALKPHOS 86 70 60 68  PROT 6.0* 6.3* 5.8* 5.7*  ALBUMIN 3.8 3.9 3.7 3.4*    TUMOR MARKERS: No results for input(s): AFPTM, CEA, CA199, CHROMGRNA in the last 8760 hours.  Assessment and Plan: 49 y.o. female smoker with history of refractory ITP who presents today for CT-guided bone marrow biopsy for further evaluation/rule out MDS.Risks and benefits of procedure was discussed with the patient  including, but not limited to bleeding, infection, damage to adjacent structures or low yield requiring additional tests.  All of the questions were answered and there is agreement to proceed.  Consent signed and in chart.     Thank you for this interesting consult.  I greatly enjoyed meeting Aaryanna Hyden and look forward to participating in their care.  A copy of this report was sent to the requesting provider on this date.  Electronically Signed: D. Rowe Robert, PA-C 05/11/2020, 8:23 AM   I spent a total of  20 minutes   in face to face in clinical consultation, greater than 50% of which was counseling/coordinating care for CT-guided bone marrow  biopsy

## 2020-05-19 ENCOUNTER — Encounter (HOSPITAL_COMMUNITY): Payer: Self-pay | Admitting: Hematology and Oncology

## 2020-05-19 ENCOUNTER — Telehealth: Payer: Self-pay

## 2020-05-19 NOTE — Telephone Encounter (Signed)
Called and given below message. She verbalized understanding. Scheudled for tomorrow 0830 Lab, then see Dr. Alvy Bimler. She is aware of times.

## 2020-05-19 NOTE — Telephone Encounter (Signed)
-----   Message from Heath Lark, MD sent at 05/19/2020 12:34 PM EDT ----- Regarding: tomorrow Bone marrow results are out Can you call and set up labs and see me tomorrow morning? 20 mins

## 2020-05-20 ENCOUNTER — Other Ambulatory Visit: Payer: Self-pay

## 2020-05-20 ENCOUNTER — Inpatient Hospital Stay: Payer: Medicaid Other | Attending: Hematology and Oncology

## 2020-05-20 ENCOUNTER — Encounter: Payer: Self-pay | Admitting: Hematology and Oncology

## 2020-05-20 ENCOUNTER — Encounter (HOSPITAL_COMMUNITY): Payer: Self-pay | Admitting: Hematology and Oncology

## 2020-05-20 ENCOUNTER — Inpatient Hospital Stay (HOSPITAL_BASED_OUTPATIENT_CLINIC_OR_DEPARTMENT_OTHER): Payer: Medicaid Other | Admitting: Hematology and Oncology

## 2020-05-20 ENCOUNTER — Telehealth: Payer: Self-pay | Admitting: *Deleted

## 2020-05-20 VITALS — BP 110/66 | HR 66 | Temp 98.5°F | Resp 18 | Ht 66.0 in | Wt 246.1 lb

## 2020-05-20 DIAGNOSIS — D693 Immune thrombocytopenic purpura: Secondary | ICD-10-CM

## 2020-05-20 DIAGNOSIS — E610 Copper deficiency: Secondary | ICD-10-CM | POA: Diagnosis not present

## 2020-05-20 DIAGNOSIS — Z79899 Other long term (current) drug therapy: Secondary | ICD-10-CM | POA: Diagnosis not present

## 2020-05-20 DIAGNOSIS — D4620 Refractory anemia with excess of blasts, unspecified: Secondary | ICD-10-CM

## 2020-05-20 DIAGNOSIS — E538 Deficiency of other specified B group vitamins: Secondary | ICD-10-CM | POA: Diagnosis not present

## 2020-05-20 DIAGNOSIS — D61818 Other pancytopenia: Secondary | ICD-10-CM | POA: Insufficient documentation

## 2020-05-20 DIAGNOSIS — K909 Intestinal malabsorption, unspecified: Secondary | ICD-10-CM

## 2020-05-20 LAB — CBC WITH DIFFERENTIAL/PLATELET
Abs Immature Granulocytes: 0.04 10*3/uL (ref 0.00–0.07)
Basophils Absolute: 0.1 10*3/uL (ref 0.0–0.1)
Basophils Relative: 1 %
Eosinophils Absolute: 0.1 10*3/uL (ref 0.0–0.5)
Eosinophils Relative: 1 %
HCT: 41.4 % (ref 36.0–46.0)
Hemoglobin: 13.7 g/dL (ref 12.0–15.0)
Immature Granulocytes: 1 %
Lymphocytes Relative: 13 %
Lymphs Abs: 0.6 10*3/uL — ABNORMAL LOW (ref 0.7–4.0)
MCH: 30.3 pg (ref 26.0–34.0)
MCHC: 33.1 g/dL (ref 30.0–36.0)
MCV: 91.6 fL (ref 80.0–100.0)
Monocytes Absolute: 0.4 10*3/uL (ref 0.1–1.0)
Monocytes Relative: 9 %
Neutro Abs: 3.6 10*3/uL (ref 1.7–7.7)
Neutrophils Relative %: 75 %
Platelets: 37 10*3/uL — ABNORMAL LOW (ref 150–400)
RBC: 4.52 MIL/uL (ref 3.87–5.11)
RDW: 15.8 % — ABNORMAL HIGH (ref 11.5–15.5)
WBC: 4.8 10*3/uL (ref 4.0–10.5)
nRBC: 0.6 % — ABNORMAL HIGH (ref 0.0–0.2)

## 2020-05-20 LAB — COMPREHENSIVE METABOLIC PANEL
ALT: 28 U/L (ref 0–44)
AST: 19 U/L (ref 15–41)
Albumin: 3.4 g/dL — ABNORMAL LOW (ref 3.5–5.0)
Alkaline Phosphatase: 71 U/L (ref 38–126)
Anion gap: 7 (ref 5–15)
BUN: 11 mg/dL (ref 6–20)
CO2: 28 mmol/L (ref 22–32)
Calcium: 9 mg/dL (ref 8.9–10.3)
Chloride: 106 mmol/L (ref 98–111)
Creatinine, Ser: 0.76 mg/dL (ref 0.44–1.00)
GFR calc Af Amer: >60 mL/min (ref >60)
GFR calc non Af Amer: >60 mL/min (ref >60)
Glucose, Bld: 76 mg/dL (ref 70–99)
Potassium: 4.3 mmol/L (ref 3.5–5.1)
Sodium: 141 mmol/L (ref 135–145)
Total Bilirubin: 0.6 mg/dL (ref 0.3–1.2)
Total Protein: 5.6 g/dL — ABNORMAL LOW (ref 6.5–8.1)

## 2020-05-20 LAB — SURGICAL PATHOLOGY

## 2020-05-20 NOTE — Assessment & Plan Note (Signed)
She is currently not symptomatic except for occasional bruises Observe closely for now The patient is educated to call me urgently if she have spontaneous bleeding

## 2020-05-20 NOTE — Telephone Encounter (Signed)
Per Dr.Gorsuch, called [facility], spoke with Amber, requested for cytogentetics to be done from recent pathology on pt.

## 2020-05-20 NOTE — Assessment & Plan Note (Signed)
She has recent adequate replacement therapy based on blood work from May 2021 We will continue the same

## 2020-05-20 NOTE — Assessment & Plan Note (Addendum)
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  However, I would like her to be seen at [facility] first We discussed the concept of chemotherapy to treat myelodysplastic syndrome, expected side effects and the concept of possible bone marrow transplant if indicated I have addressed all her questions and concerns

## 2020-05-20 NOTE — Assessment & Plan Note (Signed)
This has resolved based on recent blood work from May 2021 She will continue low-dose copper replacement therapy

## 2020-05-20 NOTE — Progress Notes (Signed)
[place] OFFICE PROGRESS NOTE  Patient Care Team: [name], NP as PCP - General (Adult Health Nurse Practitioner)  ASSESSMENT & PLAN:  MDS (myelodysplastic syndrome), low grade ([place]) I gave the patient copies of her recent bone marrow biopsy report Repeat bone marrow biopsy confirmed diagnosis of myelodysplastic syndrome with normal MDS FISH panel For calculated revised IPSS score is at the low risk category based on prior bone marrow biopsy which show some abnormal clones I told her that the chronic thrombocytopenia is not diagnostic of ITP We have replaced all mineral deficiencies with normalization of white blood cell and red blood cell lines but not the platelets Recommend referring her back to [facility] for second opinion to consider possibility of bone marrow transplant/high-dose chemotherapy if needed Based on the current guidelines, we can recommend decitabine or azacitidine for treatment.  However, I would like her to be seen at [facility] first We discussed the concept of chemotherapy to treat myelodysplastic syndrome, expected side effects and the concept of possible bone marrow transplant if indicated I have addressed all her questions and concerns  Other pancytopenia [facility]) She is currently not symptomatic except for occasional bruises Observe closely for now The patient is educated to call me urgently if she have spontaneous bleeding  Vitamin B12 deficiency due to intestinal malabsorption She has recent adequate replacement therapy based on blood work from May 2021 We will continue the same  Dietary copper deficiency This has resolved based on recent blood work from May 2021 She will continue low-dose copper replacement therapy   Orders Placed This Encounter  Procedures  . CBC with Differential/Platelet    Standing Status:   Standing    Number of Occurrences:   22    Standing Expiration Date:    05/20/2021    All questions were answered. The patient knows to call the clinic with any problems, questions or concerns. The total time spent in the appointment was 30 minutes encounter with patients including review of chart and various tests results, discussions about plan of care and coordination of care plan   Heath Lark, MD 05/20/2020 9:30 AM  INTERVAL HISTORY: Please see below for problem oriented charting. She returns with her husband for further follow-up She complained of easy bruising No spontaneous bleeding No recent infection, fever or chills  SUMMARY OF ONCOLOGIC HISTORY: Oncology History Overview Note  Cytogenetics showed 2 cell lines.  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 or MLL rearrangement)   MDS (myelodysplastic syndrome), low grade ([place])  08/15/2018 Initial Diagnosis   The patient is noted to have progressive anemia for the past few years.  Starting around 2018, she was noted to have progressive mild pancytopenia.  Most recently, around October 2019, she is noted to have significant pancytopenia. She was referred to see hematologist for evaluation.  The patient has history of gastric bypass surgery.  She has significant recent weight loss. Her primary care doctor has ordered multiple mineral panel.  She was noted to have folate, B6, B12, copper as well as iron deficiency.  She has received multiple mineral replacement therapy.  When she was seen here at the  hematology clinic, she received high-dose vitamin B12 injection. On August 09, 2018, she underwent ultrasound of her abdomen which showed no evidence of splenomegaly.  The patient has significant smoking history but denies alcohol intake She denies significant recent  infection requiring intravenous antibiotic treatment. Infectious work-up was negative. Between October to December 2019, she has received intravenous iron infusion along with vitamin B12 injection. On October 01, 2018, she underwent bone marrow aspirate and biopsy to evaluate for pancytopenia.  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  She was given vitamin B12 injection, oral copper and oral vitamin D replacement therapy   10/01/2018 Bone Marrow Biopsy   Bone Marrow, Aspirate,Biopsy, and Clot - SLIGHTLY HYPERCELLULAR BONE MARROW FOR AGE WITH DYSPOIETIC CHANGES. - SEE COMMENT. PERIPHERAL BLOOD: - PANCYTOPENIA. Diagnosis Note The bone marrow is slightly hypercellular for age with trilineage hematopoiesis but with relative abundance of erythroid precursors. This is associated with dyspoietic changes variably involving myeloid cell lines but with no increase in blastic cells The differential diagnosis includes secondary changes due to nutritional deficiency, medication, infection, autoimmune disease, toxins, etc. as well as a primary clonal myelodysplastic process. Correlation with cytogenetic and FISH studies is recommended. (BNS:kh 10/02/18)   03/27/2020 - 04/17/2020 Chemotherapy   The patient had rituxan for possible ITP without response     05/11/2020 Bone Marrow Biopsy   BONE MARROW, ASPIRATE, CLOT, CORE:  - Normocellular marrow with megakaryocytic dysplasia.   PERIPHERAL BLOOD:  - Thrombocytopenia.   COMMENT:   The marrow is normocellular but does exhibit abnormal megakaryocytes. There is no increase in blasts. While not diagnostic of a myelodysplastic syndrome alone, the changes are more than what is typically seen in ITP and thus correlation with FISH is recommended     REVIEW OF SYSTEMS:   Constitutional: Denies fevers, chills or abnormal weight loss Eyes: Denies blurriness of  vision Ears, nose, mouth, throat, and face: Denies mucositis or sore throat Respiratory: Denies cough, dyspnea or wheezes Cardiovascular: Denies palpitation, chest discomfort or lower extremity swelling Gastrointestinal:  Denies nausea, heartburn or change in bowel habits Skin: Denies abnormal skin rashes Lymphatics: Denies new lymphadenopathy  Neurological:Denies numbness, tingling or new weaknesses Behavioral/Psych: Mood is stable, no new changes  All other systems were reviewed with the patient and are negative.  I have reviewed the past medical history, past surgical history, social history and family history with the patient and they are unchanged from previous note.  ALLERGIES:  is allergic to other.  MEDICATIONS:  Current Outpatient Medications  Medication Sig Dispense Refill  . Accu-Chek Softclix Lancets lancets Use as instructed 100 each 12  . acetaminophen (TYLENOL) 500 MG tablet Take 1,000 mg by mouth daily as needed for moderate pain.    [name] albuterol (PROVENTIL) (2.5 MG/3ML) 0.083% nebulizer solution Take 3 mLs (2.5 mg total) by nebulization every 4 (four) hours as needed for wheezing or shortness of breath. 75 mL 5  . cholecalciferol (VITAMIN D3) 25 MCG (1000 UT) tablet Take 5,000 Units by mouth daily.    . colestipol (COLESTID) 1 g tablet Take 1 tablet (1 g total) by mouth as directed. 3 tablets every am and 2 tablets every pm 150 tablet 1  . Copper Gluconate 2 MG CAPS Take 4 mg by mouth daily.    . Cyanocobalamin (VITAMIN B-12 IJ) Inject 1 mg as directed as directed. Every 3 months    . diclofenac sodium (VOLTAREN) 1 % GEL Apply  2 g topically 4 (four) times daily. 2 g 3  . dicyclomine (BENTYL) 20 MG tablet Take 1 tablet (20 mg total) by mouth as needed for spasms. 120 tablet 1  . esomeprazole (NEXIUM) 40 MG capsule TAKE 1 CAPSULE(40 MG) BY MOUTH TWICE DAILY BEFORE A MEAL 60 capsule 3  . famotidine (PEPCID) 40 MG tablet TAKE 1 TABLET(40 MG) BY MOUTH TWICE DAILY 60 tablet 3   . Fluticasone-Umeclidin-Vilant (TRELEGY ELLIPTA) 100-62.5-25 MCG/INH AEPB Inhale 1 puff into the lungs daily. 60 each 5  . folic acid (FOLVITE) 1 MG tablet TAKE 1 TABLET(1 MG) BY MOUTH DAILY 90 tablet 2  . mirabegron ER (MYRBETRIQ) 25 MG TB24 tablet Take 1 tablet (25 mg total) by mouth daily. 30 tablet 5  . solifenacin (VESICARE) 10 MG tablet Take 1 tablet (10 mg total) by mouth daily. 30 tablet 5  . traZODone (DESYREL) 150 MG tablet Take 1 tablet (150 mg total) by mouth at bedtime. For sleep (Patient taking differently: Take 300 mg by mouth at bedtime. For sleep) 30 tablet 0  . venlafaxine XR (EFFEXOR-XR) 75 MG 24 hr capsule Take 1 capsule (75 mg total) by mouth daily with breakfast. For depression 30 capsule 0   No current facility-administered medications for this visit.    PHYSICAL EXAMINATION: ECOG PERFORMANCE STATUS: 0 - Asymptomatic  Vitals:   05/20/20 0834  BP: 110/66  Pulse: 66  Resp: 18  Temp: 98.5 F (36.9 C)  SpO2: 100%   Filed Weights   05/20/20 0834  Weight: 246 lb 1.6 oz (111.6 kg)    GENERAL:alert, no distress and comfortable NEURO: alert & oriented x 3 with fluent speech, no focal motor/sensory deficits  LABORATORY DATA:  I have reviewed the data as listed    Component Value Date/Time   NA 141 05/20/2020 0803   NA 140 07/04/2019 1110   K 4.3 05/20/2020 0803   CL 106 05/20/2020 0803   CO2 28 05/20/2020 0803   GLUCOSE 76 05/20/2020 0803   BUN 11 05/20/2020 0803   BUN 13 07/04/2019 1110   CREATININE 0.76 05/20/2020 0803   CREATININE 0.78 03/05/2020 1039   CREATININE 0.59 08/07/2017 1410   CALCIUM 9.0 05/20/2020 0803   PROT 5.6 (L) 05/20/2020 0803   PROT 6.2 07/04/2019 1110   ALBUMIN 3.4 (L) 05/20/2020 0803   ALBUMIN 4.5 07/04/2019 1110   AST 19 05/20/2020 0803   ALT 28 05/20/2020 0803   ALKPHOS 71 05/20/2020 0803   BILITOT 0.6 05/20/2020 0803   BILITOT 0.4 07/04/2019 1110   GFRNONAA >60 05/20/2020 0803   GFRNONAA >60 03/05/2020 1039   GFRNONAA  110 08/07/2017 1410   GFRAA >60 05/20/2020 0803   GFRAA >60 03/05/2020 1039   GFRAA 127 08/07/2017 1410    No results found for: SPEP, UPEP  Lab Results  Component Value Date   WBC 4.8 05/20/2020   NEUTROABS 3.6 05/20/2020   HGB 13.7 05/20/2020   HCT 41.4 05/20/2020   MCV 91.6 05/20/2020   PLT 37 (L) 05/20/2020      Chemistry      Component Value Date/Time   NA 141 05/20/2020 0803   NA 140 07/04/2019 1110   K 4.3 05/20/2020 0803   CL 106 05/20/2020 0803   CO2 28 05/20/2020 0803   BUN 11 05/20/2020 0803   BUN 13 07/04/2019 1110   CREATININE 0.76 05/20/2020 0803   CREATININE 0.78 03/05/2020 1039   CREATININE 0.59 08/07/2017 1410      Component Value Date/Time  CALCIUM 9.0 05/20/2020 0803   ALKPHOS 71 05/20/2020 0803   AST 19 05/20/2020 0803   ALT 28 05/20/2020 0803   BILITOT 0.6 05/20/2020 0803   BILITOT 0.4 07/04/2019 1110       RADIOGRAPHIC STUDIES: I have personally reviewed the radiological images as listed and agreed with the findings in the report. 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 Additional bone marrow biopsy has been requested for further workup. EXAM: CT GUIDED BONE MARROW ASPIRATION AND BIOPSY ANESTHESIA/SEDATION: Versed 4.0 mg IV, Fentanyl 200 mcg IV Total Moderate Sedation Time:   13 minutes. The patient's level of consciousness and physiologic status were continuously monitored during the procedure by Radiology nursing. PROCEDURE: The procedure risks, benefits, and alternatives were explained to the patient. Questions regarding the procedure were encouraged and answered. The patient understands and consents to the procedure. A time out was performed prior to initiating the procedure. The right gluteal region was prepped with chlorhexidine. Sterile gown and sterile gloves were used for the procedure. Local anesthesia was provided with 1% Lidocaine. Under CT  guidance, an 11 gauge On Control bone cutting needle was advanced from a posterior approach into the right iliac bone. Needle positioning was confirmed with CT. Initial non heparinized and heparinized aspirate samples were obtained of bone marrow. Core biopsy was performed via the On Control drill needle. COMPLICATIONS: None FINDINGS: Inspection of initial aspirate did reveal visible particles. Intact core biopsy sample was obtained. IMPRESSION: CT guided bone marrow biopsy of right posterior iliac bone with both aspirate and core samples obtained. Electronically Signed   By: Aletta Edouard M.D.   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 Additional bone marrow biopsy has been requested for further workup. EXAM: CT GUIDED BONE MARROW ASPIRATION AND BIOPSY ANESTHESIA/SEDATION: Versed 4.0 mg IV, Fentanyl 200 mcg IV Total Moderate Sedation Time:   13 minutes. The patient's level of consciousness and physiologic status were continuously monitored during the procedure by Radiology nursing. PROCEDURE: The procedure risks, benefits, and alternatives were explained to the patient. Questions regarding the procedure were encouraged and answered. The patient understands and consents to the procedure. A time out was performed prior to initiating the procedure. The right gluteal region was prepped with chlorhexidine. Sterile gown and sterile gloves were used for the procedure. Local anesthesia was provided with 1% Lidocaine. Under CT guidance, an 11 gauge On Control bone cutting needle was advanced from a posterior approach into the right iliac bone. Needle positioning was confirmed with CT. Initial non heparinized and heparinized aspirate samples were obtained of bone marrow. Core biopsy was performed via the On Control drill needle. COMPLICATIONS: None FINDINGS: Inspection of initial  aspirate did reveal visible particles. Intact core biopsy sample was obtained. IMPRESSION: CT guided bone marrow biopsy of right posterior iliac bone with both aspirate and core samples obtained. Electronically Signed   By: Aletta Edouard M.D.   On: 05/11/2020 11:22

## 2020-05-27 ENCOUNTER — Other Ambulatory Visit: Payer: Medicaid Other

## 2020-05-27 ENCOUNTER — Ambulatory Visit: Payer: Medicaid Other | Admitting: Hematology and Oncology

## 2020-06-08 ENCOUNTER — Other Ambulatory Visit: Payer: Self-pay | Admitting: Gastroenterology

## 2020-06-12 ENCOUNTER — Encounter: Payer: Self-pay | Admitting: Hematology and Oncology

## 2020-06-15 ENCOUNTER — Other Ambulatory Visit: Payer: Self-pay | Admitting: Hematology and Oncology

## 2020-06-15 ENCOUNTER — Encounter: Payer: Self-pay | Admitting: Hematology and Oncology

## 2020-06-17 ENCOUNTER — Other Ambulatory Visit: Payer: Self-pay

## 2020-06-17 ENCOUNTER — Inpatient Hospital Stay: Payer: Medicaid Other | Attending: Hematology and Oncology

## 2020-06-17 VITALS — BP 125/80 | HR 77 | Temp 98.9°F | Resp 16

## 2020-06-17 DIAGNOSIS — E538 Deficiency of other specified B group vitamins: Secondary | ICD-10-CM | POA: Diagnosis not present

## 2020-06-17 DIAGNOSIS — D696 Thrombocytopenia, unspecified: Secondary | ICD-10-CM

## 2020-06-17 DIAGNOSIS — D509 Iron deficiency anemia, unspecified: Secondary | ICD-10-CM

## 2020-06-17 DIAGNOSIS — Z9884 Bariatric surgery status: Secondary | ICD-10-CM

## 2020-06-17 MED ORDER — CYANOCOBALAMIN 1000 MCG/ML IJ SOLN
1000.0000 ug | Freq: Once | INTRAMUSCULAR | Status: AC
  Administered 2020-06-17: 1000 ug via INTRAMUSCULAR

## 2020-06-17 MED ORDER — CYANOCOBALAMIN 1000 MCG/ML IJ SOLN
INTRAMUSCULAR | Status: AC
  Filled 2020-06-17: qty 1

## 2020-06-17 NOTE — Patient Instructions (Signed)

## 2020-06-18 ENCOUNTER — Other Ambulatory Visit: Payer: Self-pay

## 2020-06-18 ENCOUNTER — Other Ambulatory Visit: Payer: Self-pay | Admitting: Gastroenterology

## 2020-06-18 MED ORDER — PANCRELIPASE (LIP-PROT-AMYL) 36000-114000 UNITS PO CPEP: ORAL_CAPSULE | ORAL | 11 refills | Status: DC

## 2020-06-24 ENCOUNTER — Encounter: Payer: Self-pay | Admitting: Hematology and Oncology

## 2020-06-30 ENCOUNTER — Ambulatory Visit: Payer: Self-pay | Admitting: Family Medicine

## 2020-07-01 ENCOUNTER — Encounter: Payer: Self-pay | Admitting: Gastroenterology

## 2020-07-01 ENCOUNTER — Ambulatory Visit: Payer: Medicaid Other | Admitting: Gastroenterology

## 2020-07-01 VITALS — BP 110/68 | HR 88 | Ht 66.0 in | Wt 239.0 lb

## 2020-07-01 DIAGNOSIS — K219 Gastro-esophageal reflux disease without esophagitis: Secondary | ICD-10-CM

## 2020-07-01 DIAGNOSIS — K8689 Other specified diseases of pancreas: Secondary | ICD-10-CM

## 2020-07-01 NOTE — Patient Instructions (Addendum)
I did not change your medications today.   I know you have many reasons to quit smoking. Please add keeping your pancreas healthy to that list! Smoking is associated with pancreas cancer and we want to be love your pancreas.  We discussed a follow-up appointment in 4 months. Please let me know if you need anything earlier.   If you are age 49 or older, your body mass index should be between 23-30. Your Body mass index is 38.58 kg/m. If this is out of the aforementioned range listed, please consider follow up with your Primary Care Provider.  If you are age 65 or younger, your body mass index should be between 19-25. Your Body mass index is 38.58 kg/m. If this is out of the aformentioned range listed, please consider follow up with your Primary Care Provider.    I appreciate the  opportunity to care for you  Thank You   Barbara Stanley

## 2020-07-01 NOTE — Progress Notes (Signed)
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 Symptoms improving on Creon, colestipol 3g QAM and 2gQPM with daily probiotics. She does not feel that her regimen needs to be altered at this time. Notes some exacerbation of symptoms with new diagnosis of MDS and need for consultation at [facility].  Smoking cessation strategies discussed. She is not ready to quit smoking.   Reflux: Controlled on Nexium 40 mg BID and famotidine 20 mg BID, although she prefers to dose them on a PRN basis.  Colon cancer screening: She will be due surveillance colonoscopy in 2030.     PLAN: Continue Nexium 40 mg BID and famotodine 20 mg BID Continue daily Benefiber and probiotics Continue dicyclomine 20 mg QID  Continue colestipol 3g QAM and 2g QPM Continue Creon 72,000 units prior to meals and 24,000 prior to snacks  Smoking cessation encouraged Follow-up in 4 months, earlier if  needed   HPI: Barbara Stanley is a 49 y.o. female  who returns in scheduled follow-up. The interval history is obtained through the patient and review of her electronic health record. Gastric bypass performed 16 years ago [place]. Not on any long term supplements until she was hospitalized last year for diarrhea-induced hypokalemia and pancytopenia secondary to undernutrition and vitamin deficiencies with b12, folate, and iron.  Initially seen for loose BM occuring 3-4 times in the morning. At it's worst, she was having 20 bowel movements daily. Baseline bowel habits following the bypass are diarrhea - but with 4 slightly formed BM daily. Was also having diffuse and upper abdominal abdominal pain that occurs immediately after eating or while eating despite Nexium QAM and famotidine QPM. Associated nausea. Occassional postprandial bowel movement that improves the pain.   An upper GI series 07/12/2019 showed moderate gastroesophageal reflux, small hiatal hernia and inability to completely assess for esophageal motility given the patient's nausea during the exam.  However there was no significant delay in contrast passage through the esophagus.  Endoscopic evaluation 09/26/2019.  Upper endoscopy revealed a normal esophagus, small hiatal hernia, evidence of gastric bypass with a small pouch that appeared normal and a normal appearing anastomosis. Duodenal biopsies were normal.  A colonoscopy performed at the tame time was normal.  Random colon biopsies from the right and left colon for microscopic colitis were normal.  At the time of her office visit 10/28/19 she was having rare heartburn minimal nausea and significant improvement in her abdominal pain. Still occuring after eating but not  every day. Pain and nausea resolve 15 minutes after defecation.  Symptoms had improved on colestipol 2 mg BID with two, soft formed stools daily. Her appetite increased and she had gained 3 pounds.  She called the office in  mid-February reporting 3-5 BM daily. Colestipol increased to 3g QAM and 2 g QPM. She returns today in follow-up.  Despite the increase, she still has frequent BM although they are more formed.  Overall her abdominal symptoms have improved. Appetite is good. She is gaining weight. Rarely using Bentyl.   At the time of office follow-up 12/25/19 she continued on colestipol 3g QAM and 2g QPM and dicyclomine. Benefiber, probiotics recommended. Follow-up stool studies with fecal elastase suggested PEI.   On follow-up 02/11/20 she reported feeling better. Could not afford Align. Was using Activa yogurt with improvement. Stools were more formed having 1-2 BM most bowel movements. Symptoms are largely postprandial. Stools are floating. Associated gas and bloating. No associated malodor. Weight is stable.  During her office visit 04/01/20 she reported having 1-2 soft, formed BM daily after completing 2 weeks of antibiotics for SIBO. Continues on daily colestipol, Benefiber, and probiotics.  Rare hard pellet BM followed by mild diarrhea.  Rare abdominal pain with eating. Weight fluctuates. Appetite is okay.  Was temporarily using meloxicam for joints, but, is no longer using.  No new complaints or concerns.   Returns today in scheduled follow-up. She recently called the office with increased symptoms. After adding Creon, the pain is better controlled. Her appetite improved a little but is still not what she wants it to be. Has had abdominal pain and poor appetite over the last 2 weeks.  Having one formed bowel movement daily. Notes malodorous stool. Slight decrease in weight during this time.  Increased life stress including a diagnosis of MDS. Now having labs done weekly.   She cut back on cigarettes but has been unable to quit. Her husband also smokes and that makes it harder to fully stop.   Recent records from Dr. Alvy Bimler show resolution in her dietary copper and B12 deficiencies. She has been diagnosed with MDS  and awaiting referral to [facility] for a second opinion regarding bone marrow transplant/high-dose chemotherapy  Prior endoscopy:  EGD for reflux several years ago "somewhere in [place]."  EGD 09/26/2019: normal esophagus, small hiatal hernia, evidence of gastric bypass with a small pouch normal-appearing anastomosis, and normal small bowel.  Duodenal biopsies were normal. Colonoscopy 09/26/2019: normal.  Random colon biopsies for microscopic colitis were normal.   Abdominal imaging: Abdominal ultrasound for abnormal liver enzymes 08/09/18:  common bile duct of 6.6 mm status post cholecystectomy.  Spleen is upper limit of normal at 12.6 cm. Transaminases at that time were elevated. They have since been repeatedly normal on several rechecks.  UGI series 07/12/19: Moderate gastroesophageal reflux. Small hiatal hernia. Esophageal motility could not be completely assessed as the patient became nauseated during the exam and could not perform this portion. However throughout the exam no significant delay in contrast passage through the esophagus was observed. CT abd/pelvis with contrast 09/04/19: Unremarkable pancreas. No ductal dilatation or surrounding inflammatory changes. Splenomegaly. Prior gastric bypass. Multiple ventral well abdominal hernias that contain fat. Thickened appendix with diffuse intramural fatty deposition. No stranding or free fluid.   Past Medical History:  Diagnosis Date  . Abdominal pain   . Anemia   . Anxiety   . Bronchitis   . COPD (chronic obstructive pulmonary disease) ([place])   . Depression  bipolar  . Diarrhea   . Emphysema   . GERD (gastroesophageal reflux disease)   . History of blood transfusion   . MDS (myelodysplastic syndrome) ([place])   . Splenomegaly    r/t low platelets  . Thrombocytopenia ([place])   . Tobacco abuse     Past Surgical History:  Procedure Laterality Date  . ABDOMINAL HYSTERECTOMY    . c section x 3     . CHOLECYSTECTOMY    . GASTRIC BYPASS    .  UPPER GASTROINTESTINAL ENDOSCOPY    . WISDOM TOOTH EXTRACTION      Current Outpatient Medications  Medication Sig Dispense Refill  . Accu-Chek Softclix Lancets lancets Use as instructed 100 each 12  . acetaminophen (TYLENOL) 500 MG tablet Take 1,000 mg by mouth daily as needed for moderate pain.    [name] albuterol (PROVENTIL) (2.5 MG/3ML) 0.083% nebulizer solution Take 3 mLs (2.5 mg total) by nebulization every 4 (four) hours as needed for wheezing or shortness of breath. 75 mL 5  . cholecalciferol (VITAMIN D3) 25 MCG (1000 UT) tablet Take 5,000 Units by mouth daily.    . colestipol (COLESTID) 1 g tablet TAKE 3 TABLETS BY MOUTH EVERY MORNING AND TAKE 2 TABLETS BY MOUTH EVERY EVENING 150 tablet 1  . Copper Gluconate 2 MG CAPS Take 4 mg by mouth daily.    . Cyanocobalamin (VITAMIN B-12 IJ) Inject 1 mg as directed as directed. Every 3 months    . diclofenac sodium (VOLTAREN) 1 % GEL Apply 2 g topically 4 (four) times daily. 2 g 3  . dicyclomine (BENTYL) 20 MG tablet Take 1 tablet (20 mg total) by mouth as needed for spasms. 120 tablet 1  . esomeprazole (NEXIUM) 40 MG capsule TAKE 1 CAPSULE(40 MG) BY MOUTH TWICE DAILY BEFORE A MEAL 60 capsule 3  . famotidine (PEPCID) 40 MG tablet TAKE 1 TABLET(40 MG) BY MOUTH TWICE DAILY 60 tablet 3  . Fluticasone-Umeclidin-Vilant (TRELEGY ELLIPTA) 100-62.5-25 MCG/INH AEPB Inhale 1 puff into the lungs daily. 60 each 5  . folic acid (FOLVITE) 1 MG tablet TAKE 1 TABLET(1 MG) BY MOUTH DAILY 90 tablet 2  . lipase/protease/amylase (CREON) 36000 UNITS CPEP capsule Take 2 capsules (72,000 Units total) by mouth 3 (three) times daily with meals. May also take 1 capsule (36,000 Units total) as needed (with snacks). 240 capsule 11  . mirabegron ER (MYRBETRIQ) 25 MG TB24 tablet Take 1 tablet (25 mg total) by mouth daily. 30 tablet 5  . solifenacin (VESICARE) 10 MG tablet Take 1 tablet (10 mg total) by mouth daily. 30 tablet 5  . traZODone (DESYREL) 150 MG tablet Take 1 tablet (150  mg total) by mouth at bedtime. For sleep (Patient taking differently: Take 300 mg by mouth at bedtime. For sleep) 30 tablet 0  . venlafaxine XR (EFFEXOR-XR) 75 MG 24 hr capsule Take 1 capsule (75 mg total) by mouth daily with breakfast. For depression 30 capsule 0   No current facility-administered medications for this visit.    Allergies as of 07/01/2020 - Review Complete 07/01/2020  Allergen Reaction Noted  . Other  08/08/2018    Family History  Problem Relation Age of Onset  . Diabetes Mother   . Diabetes Other   . Hypertension Other   . CAD Other   . Heart disease Father   . Colon cancer Neg Hx   . Rectal cancer Neg Hx   . Stomach cancer Neg Hx   . Esophageal cancer Neg Hx  Social History   Socioeconomic History  . Marital status: Married    Spouse name: Not on file  . Number of children: 3  . Years of education: Not on file  . Highest education level: Not on file  Occupational History  . Occupation: disabled  Tobacco Use  . Smoking status: Current Every Day Smoker    Packs/day: 0.75    Years: 30.00    Pack years: 22.50    Types: Cigarettes  . Smokeless tobacco: Never Used  . Tobacco comment: tobacco info given 10/28/2019  Vaping Use  . Vaping Use: Never used  Substance and Sexual Activity  . Alcohol use: Yes    Comment: occasional wine  . Drug use: No  . Sexual activity: Yes    Birth control/protection: None    Comment: Hysterectomy  Other Topics Concern  . Not on file  Social History Narrative  . Not on file   Social Determinants of Health   Financial Resource Strain:   . Difficulty of Paying Living Expenses: Not on file  Food Insecurity:   . Worried About Charity fundraiser in the Last Year: Not on file  . Ran Out of Food in the Last Year: Not on file  Transportation Needs:   . Lack of Transportation (Medical): Not on file  . Lack of Transportation (Non-Medical): Not on file  Physical Activity:   . Days of Exercise per Week: Not on file  .  Minutes of Exercise per Session: Not on file  Stress:   . Feeling of Stress : Not on file  Social Connections:   . Frequency of Communication with Friends and Family: Not on file  . Frequency of Social Gatherings with Friends and Family: Not on file  . Attends Religious Services: Not on file  . Active Member of Clubs or Organizations: Not on file  . Attends Archivist Meetings: Not on file  . Marital Status: Not on file  Intimate Partner Violence:   . Fear of Current or Ex-Partner: Not on file  . Emotionally Abused: Not on file  . Physically Abused: Not on file  . Sexually Abused: Not on file     Physical Exam: Vital signs were reviewed. General:   Alert, well-nourished, pleasant and cooperative in NAD. Bright affect.  Abdomen:  Soft, nontender, central obesity, normal bowel sounds. No rebound or guarding. No hepatosplenomegaly. Well healed midline surgical scar.  Neurologic:  Alert and  oriented x4;  grossly nonfocal Skin:  No rash or bruise. Palmar erythema present. No spider angioma.  Psych:  Alert and cooperative. Normal mood and affect.   Winson Eichorn L. Tarri Glenn Md, MPH [facility] 07/01/2020, 8:29 AM

## 2020-07-02 ENCOUNTER — Other Ambulatory Visit: Payer: Self-pay | Admitting: Primary Care

## 2020-07-02 ENCOUNTER — Other Ambulatory Visit: Payer: Self-pay

## 2020-07-02 ENCOUNTER — Ambulatory Visit (INDEPENDENT_AMBULATORY_CARE_PROVIDER_SITE_OTHER): Payer: Medicaid Other | Admitting: Nurse Practitioner

## 2020-07-02 ENCOUNTER — Encounter: Payer: Self-pay | Admitting: Nurse Practitioner

## 2020-07-02 VITALS — BP 120/76 | HR 79 | Temp 98.1°F | Resp 20 | Ht 66.0 in | Wt 239.0 lb

## 2020-07-02 DIAGNOSIS — E162 Hypoglycemia, unspecified: Secondary | ICD-10-CM | POA: Diagnosis not present

## 2020-07-02 DIAGNOSIS — F419 Anxiety disorder, unspecified: Secondary | ICD-10-CM | POA: Diagnosis not present

## 2020-07-02 MED ORDER — GLUCOSE 1 G PO CHEW: 1.0000 g | CHEWABLE_TABLET | Freq: Three times a day (TID) | ORAL | 0 refills | Status: DC | PRN

## 2020-07-02 MED ORDER — BUSPIRONE HCL 5 MG PO TABS: 5.0000 mg | ORAL_TABLET | Freq: Three times a day (TID) | ORAL | 0 refills | Status: DC

## 2020-07-02 MED ORDER — TRELEGY ELLIPTA 100-62.5-25 MCG/INH IN AEPB: 1.0000 | INHALATION_SPRAY | Freq: Every day | RESPIRATORY_TRACT | 0 refills | Status: DC

## 2020-07-02 NOTE — Progress Notes (Signed)
[place],   [id number] Phone:  [phone]   Fax:  [phone]    Established Patient Office Visit  Subjective:  Patient ID: Sonna Lipsky, female    DOB: 1970-11-23  Age: 49 y.o. MRN: [id number]  CC:  Chief Complaint  Patient presents with  . Hypoglycemia    HPI Deshae Dickison presents for follow up. She  has a past medical history of Abdominal pain, Anemia, Anxiety, Bronchitis, COPD (chronic obstructive pulmonary disease) ([place]), Depression, Diarrhea, Emphysema, GERD (gastroesophageal reflux disease), History of blood transfusion, MDS (myelodysplastic syndrome) ([place]), Splenomegaly, Thrombocytopenia ([place]), and Tobacco abuse.   She was diagnosed with Myelodysplastic syndromes  MDS @[facility] . She has chronic ITP. She admits that her glucose was 66 on her lab test and she was instructed to see her PCP. She is SP gastric bypass.  We had this gust low glucose in the past.  She admits that she has headaches, dizziness, occasional shortness of breath polydipsia, nausea and diarrhea    Past Medical History:  Diagnosis Date  . Abdominal pain   . Anemia   . Anxiety   . Bronchitis   . COPD (chronic obstructive pulmonary disease) ([place])   . Depression    bipolar  . Diarrhea   . Emphysema   . GERD (gastroesophageal reflux disease)   . History of blood transfusion   . MDS (myelodysplastic syndrome) ([place])   . Splenomegaly    r/t low platelets  . Thrombocytopenia ([place])   . Tobacco abuse     Past Surgical History:  Procedure Laterality Date  . ABDOMINAL HYSTERECTOMY    . c section x 3     . CHOLECYSTECTOMY    . GASTRIC BYPASS    . UPPER GASTROINTESTINAL ENDOSCOPY    . WISDOM TOOTH EXTRACTION      Family History  Problem Relation Age of Onset  . Diabetes Mother   . Diabetes Other   . Hypertension Other   . CAD Other   . Heart disease Father   . Colon cancer Neg Hx   . Rectal cancer Neg Hx   . Stomach cancer Neg Hx   .  Esophageal cancer Neg Hx     Social History   Socioeconomic History  . Marital status: Married    Spouse name: Not on file  . Number of children: 3  . Years of education: Not on file  . Highest education level: Not on file  Occupational History  . Occupation: disabled  Tobacco Use  . Smoking status: Current Every Day Smoker    Packs/day: 0.75    Years: 30.00    Pack years: 22.50    Types: Cigarettes  . Smokeless tobacco: Never Used  . Tobacco comment: tobacco info given 10/28/2019  Vaping Use  . Vaping Use: Never used  Substance and Sexual Activity  . Alcohol use: Yes    Comment: occasional wine  . Drug use: No  . Sexual activity: Yes    Birth control/protection: None    Comment: Hysterectomy  Other Topics Concern  . Not on file  Social History Narrative  . Not on file   Social Determinants of Health   Financial Resource Strain:   . Difficulty of Paying Living Expenses: Not on file  Food Insecurity:   . Worried About Charity fundraiser in the Last Year: Not on file  . Ran Out of Food in the Last Year: Not on file  Transportation Needs:   .  Lack of Transportation (Medical): Not on file  . Lack of Transportation (Non-Medical): Not on file  Physical Activity:   . Days of Exercise per Week: Not on file  . Minutes of Exercise per Session: Not on file  Stress:   . Feeling of Stress : Not on file  Social Connections:   . Frequency of Communication with Friends and Family: Not on file  . Frequency of Social Gatherings with Friends and Family: Not on file  . Attends Religious Services: Not on file  . Active Member of Clubs or Organizations: Not on file  . Attends Archivist Meetings: Not on file  . Marital Status: Not on file  Intimate Partner Violence:   . Fear of Current or Ex-Partner: Not on file  . Emotionally Abused: Not on file  . Physically Abused: Not on file  . Sexually Abused: Not on file    Outpatient Medications Prior to Visit  Medication  Sig Dispense Refill  . eltrombopag (PROMACTA) 50 MG tablet Take by mouth.    . Accu-Chek Softclix Lancets lancets Use as instructed 100 each 12  . acetaminophen (TYLENOL) 500 MG tablet Take 1,000 mg by mouth daily as needed for moderate pain.    [name] albuterol (PROVENTIL) (2.5 MG/3ML) 0.083% nebulizer solution Take 3 mLs (2.5 mg total) by nebulization every 4 (four) hours as needed for wheezing or shortness of breath. 75 mL 5  . cholecalciferol (VITAMIN D3) 25 MCG (1000 UT) tablet Take 5,000 Units by mouth daily.    . colestipol (COLESTID) 1 g tablet TAKE 3 TABLETS BY MOUTH EVERY MORNING AND TAKE 2 TABLETS BY MOUTH EVERY EVENING 150 tablet 1  . Copper Gluconate 2 MG CAPS Take 4 mg by mouth daily.    . Cyanocobalamin (VITAMIN B-12 IJ) Inject 1 mg as directed as directed. Every 3 months    . diclofenac sodium (VOLTAREN) 1 % GEL Apply 2 g topically 4 (four) times daily. 2 g 3  . dicyclomine (BENTYL) 20 MG tablet Take 1 tablet (20 mg total) by mouth as needed for spasms. 120 tablet 1  . esomeprazole (NEXIUM) 40 MG capsule TAKE 1 CAPSULE(40 MG) BY MOUTH TWICE DAILY BEFORE A MEAL 60 capsule 3  . famotidine (PEPCID) 40 MG tablet TAKE 1 TABLET(40 MG) BY MOUTH TWICE DAILY 60 tablet 3  . Fluticasone-Umeclidin-Vilant (TRELEGY ELLIPTA) 100-62.5-25 MCG/INH AEPB Inhale 1 puff into the lungs daily. 14 each 0  . folic acid (FOLVITE) 1 MG tablet TAKE 1 TABLET(1 MG) BY MOUTH DAILY 90 tablet 2  . lipase/protease/amylase (CREON) 36000 UNITS CPEP capsule Take 2 capsules (72,000 Units total) by mouth 3 (three) times daily with meals. May also take 1 capsule (36,000 Units total) as needed (with snacks). 240 capsule 11  . mirabegron ER (MYRBETRIQ) 25 MG TB24 tablet Take 1 tablet (25 mg total) by mouth daily. 30 tablet 5  . solifenacin (VESICARE) 10 MG tablet Take 1 tablet (10 mg total) by mouth daily. 30 tablet 5  . traZODone (DESYREL) 150 MG tablet Take 1 tablet (150 mg total) by mouth at bedtime. For sleep (Patient taking  differently: Take 300 mg by mouth at bedtime. For sleep) 30 tablet 0  . venlafaxine XR (EFFEXOR-XR) 75 MG 24 hr capsule Take 1 capsule (75 mg total) by mouth daily with breakfast. For depression 30 capsule 0   No facility-administered medications prior to visit.    Allergies  Allergen Reactions  . Other     "Banana Bag" given to patient  at 46 office, severe back pain, SOB    ROS Review of Systems  Respiratory: Positive for shortness of breath (she ran out of her trilagy).   Cardiovascular: Negative for chest pain.  Gastrointestinal: Positive for diarrhea and nausea (on and off). Negative for abdominal pain, anal bleeding, blood in stool and vomiting.  Endocrine: Positive for polydipsia.  Genitourinary:       Incontinence   Musculoskeletal: Positive for arthralgias, joint swelling and myalgias.       Generalized aching  Neurological: Positive for dizziness (frequent) and headaches (frequent behind left eye). Negative for tremors.  Hematological: Bruises/bleeds easily.  Psychiatric/Behavioral:       Anxiety       Objective:    Physical Exam Constitutional:      General: She is not in acute distress.    Appearance: She is obese. She is not ill-appearing, toxic-appearing or diaphoretic.  HENT:     Head: Normocephalic and atraumatic.     Nose: Nose normal.     Mouth/Throat:     Mouth: Mucous membranes are moist.  Cardiovascular:     Rate and Rhythm: Normal rate and regular rhythm.     Pulses: Normal pulses.     Heart sounds: Normal heart sounds.  Pulmonary:     Effort: Pulmonary effort is normal.     Breath sounds: Normal breath sounds.  Abdominal:     Palpations: Abdomen is soft.  Musculoskeletal:        General: Normal range of motion.     Cervical back: Normal range of motion.  Skin:    General: Skin is warm and dry.     Capillary Refill: Capillary refill takes less than 2 seconds.     Findings: Bruising present.  Neurological:     Mental Status: She  is alert and oriented to person, place, and time.  Psychiatric:        Mood and Affect: Mood normal.        Behavior: Behavior normal.        Thought Content: Thought content normal.        Judgment: Judgment normal.     BP 120/76   Pulse 79   Temp 98.1 F (36.7 C)   Resp 20   Ht 5\' 6"  (1.676 m)   Wt 239 lb (108.4 kg)   LMP  (LMP Unknown)   SpO2 95%   BMI 38.58 kg/m  Wt Readings from Last 3 Encounters:  07/02/20 239 lb (108.4 kg)  07/01/20 239 lb (108.4 kg)  05/20/20 246 lb 1.6 oz (111.6 kg)     There are no preventive care reminders to display for this patient.  There are no preventive care reminders to display for this patient.  Lab Results  Component Value Date   TSH 0.74 09/05/2018   Lab Results  Component Value Date   WBC 4.8 05/20/2020   HGB 13.7 05/20/2020   HCT 41.4 05/20/2020   MCV 91.6 05/20/2020   PLT 37 (L) 05/20/2020   Lab Results  Component Value Date   NA 141 05/20/2020   K 4.3 05/20/2020   CO2 28 05/20/2020   GLUCOSE 76 05/20/2020   BUN 11 05/20/2020   CREATININE 0.76 05/20/2020   BILITOT 0.6 05/20/2020   ALKPHOS 71 05/20/2020   AST 19 05/20/2020   ALT 28 05/20/2020   PROT 5.6 (L) 05/20/2020   ALBUMIN 3.4 (L) 05/20/2020   CALCIUM 9.0 05/20/2020   ANIONGAP 7 05/20/2020   GFR 93.62 12/25/2019  Lab Results  Component Value Date   CHOL 128 12/31/2019   Lab Results  Component Value Date   HDL 52 12/31/2019   Lab Results  Component Value Date   LDLCALC 63 12/31/2019   Lab Results  Component Value Date   TRIG 61 12/31/2019   Lab Results  Component Value Date   CHOLHDL 2.5 12/31/2019   Lab Results  Component Value Date   HGBA1C 5.9 (A) 04/01/2020   HGBA1C 5.9 04/01/2020   HGBA1C 5.9 04/01/2020   HGBA1C 5.9 04/01/2020      Assessment & Plan:   Problem List Items Addressed This Visit      Other   Anxiety disorder Current treatment of venlafaxine XR 75 mg s effective for her anxiety.  Failed hydroxyzine in the  past. Trial of low-dose BuSpar 5 mg 3 times daily as needed Patient to follow-up with psychiatry in October with possible adjustments as needed. Continue trazodone 300 mg nightly current indication insomnia   Relevant Medications   busPIRone (BUSPAR) 5 MG tablet   Other Relevant Orders   POCT glycosylated hemoglobin (Hb A1C)    Other Visit Diagnoses    Hypoglycemia    -  Primary Current hemoglobin A1c 5.0% Encourage patient to carry glucose tabs along with her in case she has an episode of hypoglycemia. Encourage glucose monitoring if needed.  May consider endocrinology referral in the future if symptoms persist or get worse.  However not warranted today.      Meds ordered this encounter  Medications  . busPIRone (BUSPAR) 5 MG tablet    Sig: Take 1 tablet (5 mg total) by mouth 3 (three) times daily.    Dispense:  90 tablet    Refill:  0    Order Specific Question:   Supervising Provider    Answer:   Tresa Garter [id number]  . Dextrose, Diabetic Use, (GLUCOSE) 1 g CHEW    Sig: Chew 1 g by mouth 3 (three) times daily as needed.    Dispense:  90 tablet    Refill:  0    Order Specific Question:   Supervising Provider    Answer:   Tresa Garter [id number]    Follow-up: No follow-ups on file.    [name], NP

## 2020-07-02 NOTE — Telephone Encounter (Signed)
Called [place] [facility] [phone] to initiate PA  Pt's ID number is [id number]   Pt has tried and failed symbicort and spiriva   Preferred meds are advair disc and HFA, dulera and also symbicort   Answered the clinical questions   Will await response- reference number is [phone]

## 2020-07-03 ENCOUNTER — Encounter: Payer: Self-pay | Admitting: Podiatry

## 2020-07-06 ENCOUNTER — Encounter: Payer: Self-pay | Admitting: Nurse Practitioner

## 2020-07-07 NOTE — Telephone Encounter (Signed)
Called [facility] to check the status of this request, PA for Trelegy has been denied.   Patient must try and fail two of the following drugs: Symbicort, Dulera, Advair HFA, Advair Diskus.   Patient has tried and failed Symbicort.    Dr. Valeta Harms please advise on what alternative you would like to send in for patient.  Thanks!

## 2020-07-09 LAB — POCT GLYCOSYLATED HEMOGLOBIN (HGB A1C): Hemoglobin A1C: 5 % (ref 4.0–5.6)

## 2020-07-09 NOTE — Telephone Encounter (Signed)
Icard, Octavio Graves, DO  Len Blalock, CMA 2 days ago   Ok to use advair HFA 220   Thanks   Brad     Advair doses only come in 66, 115, or 230. Dr. Valeta Harms, please advise if you were talking about  Advair 230, not 220

## 2020-07-10 MED ORDER — ADVAIR HFA 230-21 MCG/ACT IN AERO: 2.0000 | INHALATION_SPRAY | Freq: Two times a day (BID) | RESPIRATORY_TRACT | 12 refills | Status: DC

## 2020-07-10 NOTE — Telephone Encounter (Signed)
Icard, Bradley L, DO  You 10 hours ago (10:23 PM)   Yes 230   Message text    Rx for Advair 230 has been sent to preferred pharmacy for pt and mychart message has been sent to pt letting her know that this had been done. Nothing further needed.

## 2020-07-29 ENCOUNTER — Encounter: Payer: Self-pay | Admitting: Physical Medicine & Rehabilitation

## 2020-08-04 ENCOUNTER — Other Ambulatory Visit: Payer: Self-pay | Admitting: Gastroenterology

## 2020-08-08 ENCOUNTER — Other Ambulatory Visit: Payer: Self-pay | Admitting: Nurse Practitioner

## 2020-08-08 DIAGNOSIS — F419 Anxiety disorder, unspecified: Secondary | ICD-10-CM

## 2020-08-09 ENCOUNTER — Other Ambulatory Visit: Payer: Self-pay | Admitting: Obstetrics and Gynecology

## 2020-08-09 DIAGNOSIS — G4700 Insomnia, unspecified: Secondary | ICD-10-CM | POA: Insufficient documentation

## 2020-08-10 NOTE — Telephone Encounter (Signed)
Refill request from pharmacy below, please send Rx if approved.    Name from pharmacy: SOLIFENACIN 10MG  TABLETS       Will file in chart as: solifenacin (VESICARE) 10 MG tablet   Sig: TAKE 1 TABLET(10 MG) BY MOUTH DAILY   Disp:  30 tablet    Refills:  5 (Pharmacy requested: Not specified)   Start: 08/09/2020   Class: Normal   Non-formulary   To pharmacy: ZERO refills remain on this prescription. Your patient is requesting advance approval of refills for this medication to [facility] ordered: 6 months ago by Chancy Milroy, MD Last refill: 08/04/2020   Rx #: [id number]     To be filled at: [facility], [place] [address] AT [address]

## 2020-08-12 ENCOUNTER — Other Ambulatory Visit: Payer: Self-pay | Admitting: Gastroenterology

## 2020-08-13 ENCOUNTER — Other Ambulatory Visit: Payer: Self-pay

## 2020-08-13 ENCOUNTER — Encounter: Payer: Medicaid Other | Attending: Physical Medicine & Rehabilitation | Admitting: Physical Medicine & Rehabilitation

## 2020-08-13 ENCOUNTER — Encounter: Payer: Self-pay | Admitting: Physical Medicine & Rehabilitation

## 2020-08-13 VITALS — BP 106/69 | HR 65 | Temp 98.5°F | Ht 66.0 in | Wt 254.0 lb

## 2020-08-13 DIAGNOSIS — F17210 Nicotine dependence, cigarettes, uncomplicated: Secondary | ICD-10-CM | POA: Insufficient documentation

## 2020-08-13 DIAGNOSIS — G8929 Other chronic pain: Secondary | ICD-10-CM | POA: Diagnosis present

## 2020-08-13 DIAGNOSIS — M159 Polyosteoarthritis, unspecified: Secondary | ICD-10-CM | POA: Insufficient documentation

## 2020-08-13 DIAGNOSIS — M545 Low back pain, unspecified: Secondary | ICD-10-CM

## 2020-08-13 DIAGNOSIS — J4489 Other specified chronic obstructive pulmonary disease: Secondary | ICD-10-CM | POA: Insufficient documentation

## 2020-08-13 NOTE — Progress Notes (Signed)
Subjective:    Patient ID: Barbara Stanley, female    DOB: 1971-03-30, 49 y.o.   MRN: [id number]  HPI CC:  Worsening of chronic low back pain (>49yrs) Patient referred by her primary care provider, the patient was not clear what the purpose of this consultation was. 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  No pain going down the legs.  Mainly sleeps on her side  No numbness or tingling in legs.  The patient has tried Tylenol for her pain and takes 1  500 mg tablet 3 times a day.  She has had imaging study of her lumbar spine, x-ray 04/09/2020.  Report as well as films were seen.  Mild L4-5 L5-S1 disc height loss.  Good alignment. Takes hour walk few times a week.  She is able to do her own housework with daughter's assistance, she is able to do laundry independently, she cooks as well.  No bowel or bladder dysfunction.  Patient has not tried physical therapy or exercise for her back pain.  In addition the patient complains of right-sided shoulder pain.  This occurs mainly with elevation of the right shoulder.  She had no falls or trauma to that area.  She had an x-ray on 04/09/2020 which demonstrated mild degree of subacromial narrowing.  No evidence of osteoarthritis.  Pain Inventory Average Pain 5 Pain Right Now 7 My pain is constant, burning, stabbing and aching  In the last 24 hours, has pain interfered with the following? General activity 8 Relation with others 0 Enjoyment of life 6 What TIME of day is your pain at its worst? morning  and evening Sleep (in general) Fair  Pain is worse with: walking, bending, standing and some activites Pain improves with: medication Relief from Meds: 3  walk with assistance how many minutes can you walk? 15-20 ability to climb steps?  yes do you drive?  yes Do you have any goals in this area?  yes  I need assistance with the following:  meal prep and  household duties  bladder control problems trouble walking dizziness depression anxiety  new  new    Family History  Problem Relation Age of Onset  . Diabetes Mother   . Diabetes Other   . Hypertension Other   . CAD Other   . Heart disease Father   . Colon cancer Neg Hx   . Rectal cancer Neg Hx   . Stomach cancer Neg Hx   . Esophageal cancer Neg Hx    Social History   Socioeconomic History  . Marital status: Married    Spouse name: Not on file  . Number of children: 3  . Years of education: Not on file  . Highest education level: Not on file  Occupational History  . Occupation: disabled  Tobacco Use  . Smoking status: Current Every Day Smoker    Packs/day: 0.75    Years: 30.00    Pack years: 22.50    Types: Cigarettes  . Smokeless tobacco: Never Used  . Tobacco comment: tobacco info given 10/28/2019  Vaping Use  . Vaping Use: Never used  Substance and Sexual Activity  . Alcohol use: Yes    Comment: occasional wine  . Drug use: No  . Sexual activity: Yes    Birth control/protection: None    Comment: Hysterectomy  Other Topics Concern  . Not on file  Social History Narrative  .  Not on file   Social Determinants of Health   Financial Resource Strain:   . Difficulty of Paying Living Expenses: Not on file  Food Insecurity:   . Worried About Charity fundraiser in the Last Year: Not on file  . Ran Out of Food in the Last Year: Not on file  Transportation Needs:   . Lack of Transportation (Medical): Not on file  . Lack of Transportation (Non-Medical): Not on file  Physical Activity:   . Days of Exercise per Week: Not on file  . Minutes of Exercise per Session: Not on file  Stress:   . Feeling of Stress : Not on file  Social Connections:   . Frequency of Communication with Friends and Family: Not on file  . Frequency of Social Gatherings with Friends and Family: Not on file  . Attends Religious Services: Not on file  . Active Member of Clubs or  Organizations: Not on file  . Attends Archivist Meetings: Not on file  . Marital Status: Not on file   Past Surgical History:  Procedure Laterality Date  . ABDOMINAL HYSTERECTOMY    . c section x 3     . CHOLECYSTECTOMY    . GASTRIC BYPASS    . UPPER GASTROINTESTINAL ENDOSCOPY    . WISDOM TOOTH EXTRACTION     Past Medical History:  Diagnosis Date  . Abdominal pain   . Anemia   . Anxiety   . Bronchitis   . COPD (chronic obstructive pulmonary disease) ([place])   . Depression    bipolar  . Diarrhea   . Emphysema   . GERD (gastroesophageal reflux disease)   . History of blood transfusion   . MDS (myelodysplastic syndrome) ([place])   . Splenomegaly    r/t low platelets  . Thrombocytopenia ([place])   . Tobacco abuse    BP 106/69   Pulse 65   Temp 98.5 F (36.9 C)   Ht 5\' 6"  (1.676 m)   Wt 254 lb (115.2 kg)   LMP  (LMP Unknown)   SpO2 93%   BMI 41.00 kg/m   Opioid Risk Score:   Fall Risk Score:  `1  Depression screen PHQ 2/9  Depression screen [facility] 2/9 08/13/2020 04/01/2020 12/31/2019 04/03/2019 12/24/2018 09/20/2018 08/06/2018  Decreased Interest 0 0 0 0 2 0 0  Down, Depressed, Hopeless 0 1 0 0 2 0 0  PHQ - 2 Score 0 1 0 0 4 0 0  Altered sleeping 0 - - - - - -  Tired, decreased energy 0 - - - - - -  Change in appetite 0 - - - - - -  Feeling bad or failure about yourself  0 - - - - - -  Trouble concentrating 0 - - - - - -  Moving slowly or fidgety/restless 0 - - - - - -  Suicidal thoughts 0 - - - - - -  PHQ-9 Score 0 - - - - - -    Review of Systems  Constitutional: Positive for unexpected weight change.  HENT: Negative.   Eyes: Negative.   Respiratory: Negative.   Cardiovascular: Negative.   Gastrointestinal: Positive for abdominal pain and diarrhea.  Endocrine: Negative.   Genitourinary: Positive for difficulty urinating.  Musculoskeletal: Positive for arthralgias, back pain, myalgias and neck pain.  Skin: Negative.   Allergic/Immunologic: Negative.     Neurological: Positive for dizziness.  Hematological: Bruises/bleeds easily.  Psychiatric/Behavioral: Positive for dysphoric mood. The patient is  nervous/anxious.   All other systems reviewed and are negative.      Objective:   Physical Exam Vitals and nursing note reviewed.  Constitutional:      Appearance: She is obese.  HENT:     Head: Normocephalic and atraumatic.  Eyes:     Extraocular Movements: Extraocular movements intact.     Conjunctiva/sclera: Conjunctivae normal.     Pupils: Pupils are equal, round, and reactive to light.  Cardiovascular:     Rate and Rhythm: Normal rate and regular rhythm.     Heart sounds: Normal heart sounds.  Pulmonary:     Effort: Pulmonary effort is normal. No respiratory distress.     Breath sounds: Normal breath sounds. No wheezing.  Abdominal:     General: Abdomen is flat. Bowel sounds are normal. There is no distension.     Palpations: Abdomen is soft.     Tenderness: There is no abdominal tenderness.  Musculoskeletal:        General: No swelling or tenderness.  Skin:    General: Skin is warm and dry.  Neurological:     Mental Status: She is alert and oriented to person, place, and time.  Psychiatric:        Mood and Affect: Mood normal.        Behavior: Behavior normal.           Assessment & Plan:  #1.  Chronic low back pain of greater than 20 years duration with recent worsening.  Exam is most consistent with facet mediated pain.  We reviewed some back exercises with flexor bias.  She may increase her Tylenol to 1 tablet 4 times per day.  I will see her back in 1 month if not much better may consider further imaging studies versus diagnostic injections, lumbar medial branch blocks L3-4 as well as L5 dorsal ramus.  #2.  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  This is not impeding the patient functionally at this time.  If this  worsens at next visit prescribe scapular stabilization exercise.

## 2020-08-13 NOTE — Patient Instructions (Signed)

## 2020-08-14 NOTE — Telephone Encounter (Signed)
Please review  Thank you

## 2020-08-14 NOTE — Telephone Encounter (Signed)
Pt was called to discuss if she had her flu vaccine pt stated she did . Pt stated she wanted to know if she needs her  pneuma vaccine. I looked in pt HM and it didn't say she needed it so that's what I discussed with the pt.

## 2020-08-23 ENCOUNTER — Other Ambulatory Visit: Payer: Self-pay | Admitting: Gastroenterology

## 2020-08-24 ENCOUNTER — Other Ambulatory Visit: Payer: Self-pay | Admitting: Gastroenterology

## 2020-09-08 ENCOUNTER — Encounter: Payer: Medicaid Other | Admitting: Physical Medicine & Rehabilitation

## 2020-10-01 ENCOUNTER — Other Ambulatory Visit: Payer: Self-pay

## 2020-10-01 ENCOUNTER — Ambulatory Visit (INDEPENDENT_AMBULATORY_CARE_PROVIDER_SITE_OTHER): Payer: Medicaid Other | Admitting: Nurse Practitioner

## 2020-10-01 DIAGNOSIS — J449 Chronic obstructive pulmonary disease, unspecified: Secondary | ICD-10-CM | POA: Diagnosis not present

## 2020-10-01 DIAGNOSIS — M542 Cervicalgia: Secondary | ICD-10-CM

## 2020-10-01 DIAGNOSIS — M545 Low back pain, unspecified: Secondary | ICD-10-CM | POA: Diagnosis not present

## 2020-10-01 DIAGNOSIS — F332 Major depressive disorder, recurrent severe without psychotic features: Secondary | ICD-10-CM

## 2020-10-01 DIAGNOSIS — M1990 Unspecified osteoarthritis, unspecified site: Secondary | ICD-10-CM

## 2020-10-01 DIAGNOSIS — D4620 Refractory anemia with excess of blasts, unspecified: Secondary | ICD-10-CM

## 2020-10-01 DIAGNOSIS — E162 Hypoglycemia, unspecified: Secondary | ICD-10-CM

## 2020-10-01 DIAGNOSIS — G8929 Other chronic pain: Secondary | ICD-10-CM

## 2020-10-01 NOTE — Progress Notes (Signed)
Barbara Stanley, [place]  [id number] Phone:  [phone]   Fax:  [phone]   Established Patient Office Visit  Subjective:  Patient ID: Barbara Stanley, female    DOB: Sep 23, 1971  Age: 49 y.o. MRN: [id number]  CC:  Chief Complaint  Patient presents with  . Follow-up    Wants chiropractor referral, needs jury duty letter    HPI Barbara Stanley presents for follow up. She  has a past medical history of Abdominal pain, Anemia, Anxiety, Bronchitis, COPD (chronic obstructive pulmonary disease) ([place]), Depression, Diarrhea, Emphysema, GERD (gastroesophageal reflux disease), History of blood transfusion, MDS (myelodysplastic syndrome) ([place]), Splenomegaly, Thrombocytopenia ([place]), and Tobacco abuse.   Low Back Pain Patient complains of chronic low back pain. This is evaluated as a personal injury. The patient first noted symptoms several weeks ago. It was related to no known injury. The pain is rated moderate, and is located at the right lumbar area or left lumbar area. The pain is described as aching and occurs intermittently. The symptoms has been progressive. Symptoms are exacerbated by nothing in particular. Factors which relieve the pain include acetaminophen, exercise, rest and stretching. She admits that her husband performed deep massage type treatment, which was effective. Other associated symptoms include no other symptoms. Previous history of symptoms: the problem is long-standing. Treatment efforts have included rest, acetaminophen, PT and home exercises, with and without relief.   Past Medical History:  Diagnosis Date  . Abdominal pain   . Anemia   . Anxiety   . Bronchitis   . COPD (chronic obstructive pulmonary disease) ([place])   . Depression    bipolar  . Diarrhea   . Emphysema   . GERD (gastroesophageal reflux disease)   . History of blood transfusion   . MDS (myelodysplastic syndrome) ([place])   . Splenomegaly    r/t low platelets  .  Thrombocytopenia ([place])   . Tobacco abuse     Past Surgical History:  Procedure Laterality Date  . ABDOMINAL HYSTERECTOMY    . c section x 3     . CHOLECYSTECTOMY    . GASTRIC BYPASS    . UPPER GASTROINTESTINAL ENDOSCOPY    . WISDOM TOOTH EXTRACTION      Family History  Problem Relation Age of Onset  . Diabetes Mother   . Diabetes Other   . Hypertension Other   . CAD Other   . Heart disease Father   . Colon cancer Neg Hx   . Rectal cancer Neg Hx   . Stomach cancer Neg Hx   . Esophageal cancer Neg Hx     Social History   Socioeconomic History  . Marital status: Married    Spouse name: Not on file  . Number of children: 3  . Years of education: Not on file  . Highest education level: Not on file  Occupational History  . Occupation: disabled  Tobacco Use  . Smoking status: Current Every Day Smoker    Packs/day: 0.75    Years: 30.00    Pack years: 22.50    Types: Cigarettes  . Smokeless tobacco: Never Used  . Tobacco comment: tobacco info given 10/28/2019  Vaping Use  . Vaping Use: Never used  Substance and Sexual Activity  . Alcohol use: Yes    Comment: occasional wine  . Drug use: No  . Sexual activity: Yes    Birth control/protection: None    Comment: Hysterectomy  Other Topics Concern  . Not  on file  Social History Narrative  . Not on file   Social Determinants of Health   Financial Resource Strain: Not on file  Food Insecurity: Not on file  Transportation Needs: Not on file  Physical Activity: Not on file  Stress: Not on file  Social Connections: Not on file  Intimate Partner Violence: Not on file    Outpatient Medications Prior to Visit  Medication Sig Dispense Refill  . Accu-Chek Softclix Lancets lancets Use as instructed 100 each 12  . acetaminophen (TYLENOL) 500 MG tablet Take 1,000 mg by mouth daily as needed for moderate pain.    [name] albuterol (PROVENTIL) (2.5 MG/3ML) 0.083% nebulizer solution Take 3 mLs (2.5 mg total) by nebulization every  4 (four) hours as needed for wheezing or shortness of breath. 75 mL 5  . busPIRone (BUSPAR) 5 MG tablet TAKE 1 TABLET(5 MG) BY MOUTH THREE TIMES DAILY 90 tablet 0  . cholecalciferol (VITAMIN D3) 25 MCG (1000 UT) tablet Take 5,000 Units by mouth daily.    . colestipol (COLESTID) 1 g tablet TAKE 3 TABLETS BY MOUTH EVERY MORNING AND TAKE 2 TABLETS BY MOUTH EVERY EVENING 150 tablet 0  . Copper Gluconate 2 MG CAPS Take 4 mg by mouth daily.    . Cyanocobalamin (VITAMIN B-12 IJ) Inject 1 mg as directed as directed. Every 3 months    . Dextrose, Diabetic Use, (GLUCOSE) 1 g CHEW Chew 1 g by mouth 3 (three) times daily as needed. 90 tablet 0  . diclofenac sodium (VOLTAREN) 1 % GEL Apply 2 g topically 4 (four) times daily. 2 g 3  . dicyclomine (BENTYL) 20 MG tablet TAKE 1 TABLET BY MOUTH THREE TIMES DAILY 120 tablet 1  . eltrombopag (PROMACTA) 50 MG tablet Take by mouth.    . esomeprazole (NEXIUM) 40 MG capsule TAKE 1 CAPSULE(40 MG) BY MOUTH TWICE DAILY BEFORE A MEAL 60 capsule 3  . famotidine (PEPCID) 40 MG tablet TAKE 1 TABLET(40 MG) BY MOUTH TWICE DAILY 60 tablet 1  . fluticasone-salmeterol (ADVAIR HFA) 230-21 MCG/ACT inhaler Inhale 2 puffs into the lungs 2 (two) times daily. 12 g 12  . Fluticasone-Umeclidin-Vilant (TRELEGY ELLIPTA) 100-62.5-25 MCG/INH AEPB Inhale 1 puff into the lungs daily. 14 each 0  . folic acid (FOLVITE) 1 MG tablet TAKE 1 TABLET(1 MG) BY MOUTH DAILY 90 tablet 2  . lipase/protease/amylase (CREON) 36000 UNITS CPEP capsule Take 2 capsules (72,000 Units total) by mouth 3 (three) times daily with meals. May also take 1 capsule (36,000 Units total) as needed (with snacks). 240 capsule 11  . mirabegron ER (MYRBETRIQ) 25 MG TB24 tablet Take 1 tablet (25 mg total) by mouth daily. 30 tablet 5  . solifenacin (VESICARE) 10 MG tablet TAKE 1 TABLET(10 MG) BY MOUTH DAILY 30 tablet 5  . traZODone (DESYREL) 150 MG tablet Take 1 tablet (150 mg total) by mouth at bedtime. For sleep (Patient taking  differently: Take 300 mg by mouth at bedtime. For sleep) 30 tablet 0  . venlafaxine XR (EFFEXOR-XR) 75 MG 24 hr capsule Take 1 capsule (75 mg total) by mouth daily with breakfast. For depression 30 capsule 0   No facility-administered medications prior to visit.    Allergies  Allergen Reactions  . Other     "Banana Bag" given to patient at physician's office, severe back pain, SOB    ROS Review of Systems    Objective:    Physical Exam Constitutional:      Appearance: She is obese.  HENT:  Head: Normocephalic and atraumatic.     Nose: Nose normal.     Mouth/Throat:     Mouth: Mucous membranes are moist.  Cardiovascular:     Rate and Rhythm: Normal rate and regular rhythm.     Pulses: Normal pulses.     Heart sounds: Normal heart sounds.  Pulmonary:     Effort: Pulmonary effort is normal.     Breath sounds: Normal breath sounds.  Abdominal:     General: Bowel sounds are normal.     Palpations: Abdomen is soft.  Musculoskeletal:        General: Normal range of motion.     Cervical back: Normal range of motion.  Skin:    General: Skin is warm.     Findings: Bruising (minimal) present.  Neurological:     General: No focal deficit present.     Mental Status: She is alert and oriented to person, place, and time.  Psychiatric:        Mood and Affect: Mood normal.        Behavior: Behavior normal.        Thought Content: Thought content normal.        Judgment: Judgment normal.     LMP  (LMP Unknown)  Wt Readings from Last 3 Encounters:  08/13/20 254 lb (115.2 kg)  07/02/20 239 lb (108.4 kg)  07/01/20 239 lb (108.4 kg)     Health Maintenance Due  Topic Date Due  . COVID-19 Vaccine (3 - Pfizer risk 4-dose series) 03/20/2020    There are no preventive care reminders to display for this patient.  Lab Results  Component Value Date   TSH 0.74 09/05/2018   Lab Results  Component Value Date   WBC 4.8 05/20/2020   HGB 13.7 05/20/2020   HCT 41.4  05/20/2020   MCV 91.6 05/20/2020   PLT 37 (L) 05/20/2020   Lab Results  Component Value Date   NA 141 05/20/2020   K 4.3 05/20/2020   CO2 28 05/20/2020   GLUCOSE 76 05/20/2020   BUN 11 05/20/2020   CREATININE 0.76 05/20/2020   BILITOT 0.6 05/20/2020   ALKPHOS 71 05/20/2020   AST 19 05/20/2020   ALT 28 05/20/2020   PROT 5.6 (L) 05/20/2020   ALBUMIN 3.4 (L) 05/20/2020   CALCIUM 9.0 05/20/2020   ANIONGAP 7 05/20/2020   GFR 93.62 12/25/2019   Lab Results  Component Value Date   CHOL 128 12/31/2019   Lab Results  Component Value Date   HDL 52 12/31/2019   Lab Results  Component Value Date   LDLCALC 63 12/31/2019   Lab Results  Component Value Date   TRIG 61 12/31/2019   Lab Results  Component Value Date   CHOLHDL 2.5 12/31/2019   Lab Results  Component Value Date   HGBA1C 5.0 07/09/2020      Assessment & Plan:   Problem List Items Addressed This Visit      Respiratory   COPD (chronic obstructive pulmonary disease) ([place])     Other   MDS (myelodysplastic syndrome), low grade ([facility]) Stable continue to follow up with Oncology   Major depression, recurrent ([place]) Stable declined any changes to current regimen    Other Visit Diagnoses    Chronic midline low back pain without sciatica    -  Primary Continue home PT Referral per pt request Images on hold due to referral    Relevant Orders   Ambulatory referral to Chiropractic   Chronic neck  pain       Relevant Orders   Ambulatory referral to Chiropractic   Arthritis       Hypoglycemia, unspecified     She continues to have this on occasions. May consider Precose 25 mg TID if symptoms persist with an transabdominal US       No orders of the defined types were placed in this encounter.   Follow-up: Return in about 6 months (around 04/01/2021).    [name], NP

## 2020-10-01 NOTE — Patient Instructions (Signed)
Abdominal Pain, Adult Pain in the abdomen (abdominal pain) can be caused by many things. Often, abdominal pain is not serious and it gets better with no treatment or by being treated at home. However, sometimes abdominal pain is serious. Your health care provider will ask questions about your medical history and do a physical exam to try to determine the cause of your abdominal pain. Follow these instructions at home:  Medicines  Take over-the-counter and prescription medicines only as told by your health care provider.  Do not take a laxative unless told by your health care provider. General instructions  Watch your condition for any changes.  Drink enough fluid to keep your urine pale yellow.  Keep all follow-up visits as told by your health care provider. This is important. Contact a health care provider if:  Your abdominal pain changes or gets worse.  You are not hungry or you lose weight without trying.  You are constipated or have diarrhea for more than 2-3 days.  You have pain when you urinate or have a bowel movement.  Your abdominal pain wakes you up at night.  Your pain gets worse with meals, after eating, or with certain foods.  You are vomiting and cannot keep anything down.  You have a fever.  You have blood in your urine. Get help right away if:  Your pain does not go away as soon as your health care provider told you to expect.  You cannot stop vomiting.  Your pain is only in areas of the abdomen, such as the right side or the left lower portion of the abdomen. Pain on the right side could be caused by appendicitis.  You have bloody or black stools, or stools that look like tar.  You have severe pain, cramping, or bloating in your abdomen.  You have signs of dehydration, such as: ? Dark urine, very little urine, or no urine. ? Cracked lips. ? Dry mouth. ? Sunken eyes. ? Sleepiness. ? Weakness.  You have trouble breathing or chest  pain. Summary  Often, abdominal pain is not serious and it gets better with no treatment or by being treated at home. However, sometimes abdominal pain is serious.  Watch your condition for any changes.  Take over-the-counter and prescription medicines only as told by your health care provider.  Contact a health care provider if your abdominal pain changes or gets worse.  Get help right away if you have severe pain, cramping, or bloating in your abdomen. This information is not intended to replace advice given to you by your health care provider. Make sure you discuss any questions you have with your health care provider. Document Revised: 02/11/2019 Document Reviewed: 02/11/2019 Elsevier Patient Education  2020 [employer]  

## 2020-10-03 ENCOUNTER — Encounter: Payer: Self-pay | Admitting: Nurse Practitioner

## 2020-10-05 ENCOUNTER — Other Ambulatory Visit: Payer: Self-pay | Admitting: Obstetrics and Gynecology

## 2020-10-05 DIAGNOSIS — N3946 Mixed incontinence: Secondary | ICD-10-CM

## 2020-10-11 ENCOUNTER — Other Ambulatory Visit: Payer: Self-pay | Admitting: Gastroenterology

## 2020-10-12 ENCOUNTER — Ambulatory Visit: Payer: Medicaid Other | Admitting: Primary Care

## 2020-10-12 ENCOUNTER — Ambulatory Visit (INDEPENDENT_AMBULATORY_CARE_PROVIDER_SITE_OTHER): Payer: Medicaid Other | Admitting: Pulmonary Disease

## 2020-10-12 ENCOUNTER — Other Ambulatory Visit: Payer: Self-pay

## 2020-10-12 ENCOUNTER — Encounter: Payer: Self-pay | Admitting: Pulmonary Disease

## 2020-10-12 VITALS — BP 122/74 | HR 91 | Temp 97.5°F | Ht 64.0 in | Wt 249.2 lb

## 2020-10-12 DIAGNOSIS — F17200 Nicotine dependence, unspecified, uncomplicated: Secondary | ICD-10-CM

## 2020-10-12 DIAGNOSIS — J449 Chronic obstructive pulmonary disease, unspecified: Secondary | ICD-10-CM

## 2020-10-12 DIAGNOSIS — Z0000 Encounter for general adult medical examination without abnormal findings: Secondary | ICD-10-CM

## 2020-10-12 DIAGNOSIS — F17210 Nicotine dependence, cigarettes, uncomplicated: Secondary | ICD-10-CM | POA: Diagnosis not present

## 2020-10-12 MED ORDER — PREDNISONE 10 MG PO TABS: ORAL_TABLET | ORAL | 0 refills | Status: DC

## 2020-10-12 MED ORDER — NICOTINE POLACRILEX 4 MG MT LOZG: 4.0000 mg | LOZENGE | OROMUCOSAL | 3 refills | Status: DC | PRN

## 2020-10-12 MED ORDER — SPIRIVA RESPIMAT 2.5 MCG/ACT IN AERS: 2.0000 | INHALATION_SPRAY | Freq: Every day | RESPIRATORY_TRACT | 0 refills | Status: DC

## 2020-10-12 MED ORDER — NICOTINE 21 MG/24HR TD PT24: 21.0000 mg | MEDICATED_PATCH | Freq: Every day | TRANSDERMAL | 3 refills | Status: DC

## 2020-10-12 NOTE — Patient Instructions (Addendum)
You were seen today by Coral Ceo, NP  for:   1. Chronic obstructive pulmonary disease, unspecified COPD type (HCC)  Prednisone 10mg  tablet  >>>Take 2 tablets (20 mg total) daily for the next 5 days >>> Take with food in the morning  Continue Advair 250  Spiriva Respimat 2.5 >>> 2 puffs daily >>> Do this every day >>>This is not a rescue inhaler  Note your daily symptoms > remember "red flags" for COPD:   >>>Increase in cough >>>increase in sputum production >>>increase in shortness of breath or activity  intolerance.   If you notice these symptoms, please call the office to be seen.   We recommend that you get the booster for covid19  2. Tobacco use disorder  Nicotine patches: >>>Make sure you rotate sites that you do not get skin irritation, Apply 1 patch each morning to a non-hairy skin site  If you are smoking greater than 10 cigarettes/day and weigh over 45 kg start with the nicotine patch of 21 mg a day for 6 weeks, then 14 mg a day for 2 weeks, then finished with 7 mg a day for 2 weeks, then stop  If you are smoking less than 10 cigarettes a day or weight less than 45 kg start with medium dose pack of 14 mg a day for 6 weeks, followed by 7 mg a day for 2 weeks   >>>If insomnia occurs you are having trouble sleeping you can take the patch off at night, and place a new one on in the morning >>>If the patch is removed at night and you have morning cravings start short acting nicotine replacement therapy such as gum or lozenges  >>>Avoid acidic beverages such as coffee, carbonated beverages before and during gum use.  A soft acidic beverages lower oral pH which cause nicotine to not be absorbed properly >>>If you chew the gum too quickly or vigorously you could have nausea, vomiting, abdominal pain, constipation, hiccups, headache, sore jaw, mouth irritation ulcers  Nicotine lozenge: Lozenges are commonly uses short acting NRT product  >>>Smokers who smoke within 30  minutes of awakening should use 4 mg dose >>>Smokers who wait more than 30 minutes after awakening to smoke should use 2 mg dose  Can use up to 1 lozenge every 1-2 hours for 6 weeks >>>Total amount of lozenges that can be used per day as 20 >>>Gradually reduce number of lozenges used per day after 2 weeks of use  Place lozenge in mouth and allowed to dissolve for 30 minutes loss and does not need to be chewed  Lozenges have advantages to be able to be used in people with TMG, poor dentition, dentures  We recommend that you stop smoking.  >>>You need to set a quit date >>>If you have friends or family who smoke, let them know you are trying to quit and not to smoke around you or in your living environment  Smoking Cessation Resources:  1 800 QUIT NOW  >>> Patient to call this resource and utilize it to help support her quit smoking >>> Keep up your hard work with stopping smoking  You can also contact the [facility] >>>For smoking cessation classes call [phone]  We do not recommend using e-cigarettes as a form of stopping smoking  You can sign up for smoking cessation support texts and information:  >>>https://smokefree.gov/smokefreetxt    Follow Up:    Return in about 4 weeks (around 11/09/2020), or if symptoms worsen or fail  to improve, for Follow up with Wyn Quaker FNP-C, Follow up with Dr. Valeta Harms.   Notification of test results are managed in the following manner: If there are  any recommendations or changes to the  plan of care discussed in office today,  we will contact you and let you know what they are. If you do not hear from [place], then your results are normal and you can view them through your  MyChart account , or a letter will be sent to you. Thank you again for trusting [place] with your care  - Thank you, [facility]    It is flu season:   >>> Best ways to protect herself from the flu: Receive the yearly flu vaccine, practice good hand hygiene  washing with soap and also using hand sanitizer when available, eat a nutritious meals, get adequate rest, hydrate appropriately       Please contact the office if your symptoms worsen or you have concerns that you are not improving.   Thank you for choosing [facility] for your healthcare, and for allowing [place] to partner with you on your healthcare journey. I am thankful to be able to provide care to you today.   Wyn Quaker FNP-C    Chronic Obstructive Pulmonary Disease Chronic obstructive pulmonary disease (COPD) is a long-term (chronic) lung problem. When you have COPD, it is hard for air to get in and out of your lungs. Usually the condition gets worse over time, and your lungs will never return to normal. There are things you can do to keep yourself as healthy as possible.  Your doctor may treat your condition with: ? Medicines. ? Oxygen. ? Lung surgery.  Your doctor may also recommend: ? Rehabilitation. This includes steps to make your body work better. It may involve a team of specialists. ? Quitting smoking, if you smoke. ? Exercise and changes to your diet. ? Comfort measures (palliative care). Follow these instructions at home: Medicines  Take over-the-counter and prescription medicines only as told by your doctor.  Talk to your doctor before taking any cough or allergy medicines. You may need to avoid medicines that cause your lungs to be dry. Lifestyle  If you smoke, stop. Smoking makes the problem worse. If you need help quitting, ask your doctor.  Avoid being around things that make your breathing worse. This may include smoke, chemicals, and fumes.  Stay active, but remember to rest as well.  Learn and use tips on how to relax.  Make sure you get enough sleep. Most adults need at least 7 hours of sleep every night.  Eat healthy foods. Eat smaller meals more often. Rest before meals. Controlled breathing Learn and use tips on how to control your  breathing as told by your doctor. Try:  Breathing in (inhaling) through your nose for 1 second. Then, pucker your lips and breath out (exhale) through your lips for 2 seconds.  Putting one hand on your belly (abdomen). Breathe in slowly through your nose for 1 second. Your hand on your belly should move out. Pucker your lips and breathe out slowly through your lips. Your hand on your belly should move in as you breathe out.  Controlled coughing Learn and use controlled coughing to clear mucus from your lungs. Follow these steps: 1. Lean your head a little forward. 2. Breathe in deeply. 3. Try to hold your breath for 3 seconds. 4. Keep your mouth slightly open while coughing 2 times. 5. Spit any  mucus out into a tissue. 6. Rest and do the steps again 1 or 2 times as needed. General instructions  Make sure you get all the shots (vaccines) that your doctor recommends. Ask your doctor about a flu shot and a pneumonia shot.  Use oxygen therapy and pulmonary rehabilitation if told by your doctor. If you need home oxygen therapy, ask your doctor if you should buy a tool to measure your oxygen level (oximeter).  Make a COPD action plan with your doctor. This helps you to know what to do if you feel worse than usual.  Manage any other conditions you have as told by your doctor.  Avoid going outside when it is very hot, cold, or humid.  Avoid people who have a sickness you can catch (contagious).  Keep all follow-up visits as told by your doctor. This is important. Contact a doctor if:  You cough up more mucus than usual.  There is a change in the color or thickness of the mucus.  It is harder to breathe than usual.  Your breathing is faster than usual.  You have trouble sleeping.  You need to use your medicines more often than usual.  You have trouble doing your normal activities such as getting dressed or walking around the house. Get help right away if:  You have shortness of  breath while resting.  You have shortness of breath that stops you from: ? Being able to talk. ? Doing normal activities.  Your chest hurts for longer than 5 minutes.  Your skin color is more blue than usual.  Your pulse oximeter shows that you have low oxygen for longer than 5 minutes.  You have a fever.  You feel too tired to breathe normally. Summary  Chronic obstructive pulmonary disease (COPD) is a long-term lung problem.  The way your lungs work will never return to normal. Usually the condition gets worse over time. There are things you can do to keep yourself as healthy as possible.  Take over-the-counter and prescription medicines only as told by your doctor.  If you smoke, stop. Smoking makes the problem worse. This information is not intended to replace advice given to you by your health care provider. Make sure you discuss any questions you have with your health care provider. Document Revised: 09/15/2017 Document Reviewed: 11/07/2016 Elsevier Patient Education  2020 [employer].

## 2020-10-12 NOTE — Progress Notes (Signed)
@Patient  ID: , female    DOB: 22-Oct-1970, 49 y.o.   MRN: [id number]  Chief Complaint  Patient presents with  . Follow-up    COPD, SOB seems to be worse, esp at night    Referring provider: [id number], NP  HPI:  49 year old female current everyday smoker followed in our office for COPD  PMH: GERD, anemia, depression, anxiety, PTSD, history of suicide attempts Smoker/ Smoking History: Current everyday smoker.  1 pack/day.  30-pack-year history. Maintenance: Advair 250 Pt of: Dr. 54  10/12/2020  - Visit   49 year old female current everyday smoker followed in our office for COPD.  Presenting today as a follow-up.  She feels that her shortness of breath is worsened.  She did better on Trelegy Ellipta but unfortunately this was not covered by her insurance.  She is now currently on Advair 250.  She feels that she has to utilize her rescue inhaler daily because the Advair "wears off".  Patient does still continue to smoke 1 pack/day.  She has a 30-pack-year smoking history.  She is willing to try to decrease her smoking.  She has received the first 2 Covid vaccinations.  She has not yet received her booster.  Her and her husband both are concerned about the allegedly "metals" in the booster vaccination so they have heard from their friends.  We will discuss this today.  Questionaires / Pulmonary Flowsheets:   ACT:  No flowsheet data found.  MMRC: No flowsheet data found.  Epworth:  No flowsheet data found.  Tests:   FENO:  No results found for: NITRICOXIDE  PFT: PFT Results Latest Ref Rng & Units 07/01/2019  FVC-Pre L 3.36  FVC-Predicted Pre % 82  FVC-Post L 3.62  FVC-Predicted Post % 88  Pre FEV1/FVC % % 58  Post FEV1/FCV % % 60  FEV1-Pre L 1.95  FEV1-Predicted Pre % 60  FEV1-Post L 2.18  DLCO uncorrected ml/min/mmHg 22.11  DLCO UNC% % 91  DLCO corrected ml/min/mmHg 21.97  DLCO COR %Predicted % 90  DLVA Predicted % 83  TLC L 7.65  TLC %  Predicted % 135  RV % Predicted % 212    WALK:  No flowsheet data found.  Imaging: No results found.  Lab Results:  CBC    Component Value Date/Time   WBC 4.8 05/20/2020 0803   RBC 4.52 05/20/2020 0803   HGB 13.7 05/20/2020 0803   HGB 11.7 (L) 09/21/2018 1000   HGB 10.3 (L) 08/01/2018 1154   HCT 41.4 05/20/2020 0803   HCT 32.9 (L) 08/01/2018 1154   PLT 37 (L) 05/20/2020 0803   PLT 15 (L) 09/21/2018 1000   PLT 37 (LL) 08/01/2018 1154   MCV 91.6 05/20/2020 0803   MCV 83 08/01/2018 1154   MCH 30.3 05/20/2020 0803   MCHC 33.1 05/20/2020 0803   RDW 15.8 (H) 05/20/2020 0803   RDW 15.4 08/01/2018 1154   LYMPHSABS 0.6 (L) 05/20/2020 0803   LYMPHSABS 0.9 08/01/2018 1154   MONOABS 0.4 05/20/2020 0803   EOSABS 0.1 05/20/2020 0803   EOSABS 0.0 08/01/2018 1154   BASOSABS 0.1 05/20/2020 0803   BASOSABS 0.0 08/01/2018 1154    BMET    Component Value Date/Time   NA 141 05/20/2020 0803   NA 140 07/04/2019 1110   K 4.3 05/20/2020 0803   CL 106 05/20/2020 0803   CO2 28 05/20/2020 0803   GLUCOSE 76 05/20/2020 0803   BUN 11 05/20/2020 0803   BUN 13 07/04/2019  1110   CREATININE 0.76 05/20/2020 0803   CREATININE 0.78 03/05/2020 1039   CREATININE 0.59 08/07/2017 1410   CALCIUM 9.0 05/20/2020 0803   GFRNONAA >60 05/20/2020 0803   GFRNONAA >60 03/05/2020 1039   GFRNONAA 110 08/07/2017 1410   GFRAA >60 05/20/2020 0803   GFRAA >60 03/05/2020 1039   GFRAA 127 08/07/2017 1410    BNP No results found for: BNP  ProBNP    Component Value Date/Time   PROBNP 22.0 09/30/2013 1234    Specialty Problems      Pulmonary Problems   COPD (chronic obstructive pulmonary disease) (HCC)   Emphysema      Allergies  Allergen Reactions  . Other     "Banana Bag" given to patient at physician's office, severe back pain, SOB    Immunization History  Administered Date(s) Administered  . Influenza,inj,Quad PF,6+ Mos 08/07/2017, 07/27/2018, 07/04/2019  . Influenza,inj,Quad PF,6-35  Mos 08/10/2020  . PFIZER SARS-COV-2 Vaccination 01/30/2020, 02/21/2020  . Pneumococcal Conjugate-13 07/27/2018  . Pneumococcal Polysaccharide-23 03/22/2014, 08/07/2017  . Tdap 08/07/2017    Past Medical History:  Diagnosis Date  . Abdominal pain   . Anemia   . Anxiety   . Bronchitis   . COPD (chronic obstructive pulmonary disease) ([place])   . Depression    bipolar  . Diarrhea   . Emphysema   . GERD (gastroesophageal reflux disease)   . History of blood transfusion   . MDS (myelodysplastic syndrome) ([place])   . Splenomegaly    r/t low platelets  . Thrombocytopenia ([place])   . Tobacco abuse     Tobacco History: Social History   Tobacco Use  Smoking Status Current Every Day Smoker  . Packs/day: 1.00  . Years: 30.00  . Pack years: 30.00  . Types: Cigarettes  Smokeless Tobacco Never Used  Tobacco Comment   tobacco info given 10/28/2019   Ready to quit: No Counseling given: Yes Comment: tobacco info given 10/28/2019  Smoking assessment and cessation counseling  Patient currently smoking: 1 ppd I have advised the patient to quit/stop smoking as soon as possible due to high risk for multiple medical problems.  It will also be very difficult for [place] to manage patient's  respiratory symptoms and status if we continue to expose her lungs to a known irritant.  We do not advise e-cigarettes as a form of stopping smoking.  Patient is willing to quit smoking.  Patient willing to initiate reduce to quit method.  Goal is to be to 15 cigarettes on 10/17/2020.  Patient then hopes to slowly work on decreasing down.  She is agreeable to nicotine replacement therapies.  I have advised the patient that we can assist and have options of nicotine replacement therapy, provided smoking cessation education today, provided smoking cessation counseling, and provided cessation resources.  Follow-up next office visit office visit for assessment of smoking cessation.    Smoking cessation counseling  advised for: 7 min    Outpatient Encounter Medications as of 10/12/2020  Medication Sig  . Accu-Chek Softclix Lancets lancets Use as instructed  . acetaminophen (TYLENOL) 500 MG tablet Take 1,000 mg by mouth daily as needed for moderate pain.  [name] albuterol (PROVENTIL) (2.5 MG/3ML) 0.083% nebulizer solution Take 3 mLs (2.5 mg total) by nebulization every 4 (four) hours as needed for wheezing or shortness of breath.  . busPIRone (BUSPAR) 5 MG tablet TAKE 1 TABLET(5 MG) BY MOUTH THREE TIMES DAILY  . cholecalciferol (VITAMIN D3) 25 MCG (1000 UT) tablet Take 5,000 Units  by mouth daily.  . colestipol (COLESTID) 1 g tablet TAKE 3 TABLETS BY MOUTH EVERY MORNING AND TAKE 2 TABLETS BY MOUTH EVERY EVENING  . Copper Gluconate 2 MG CAPS Take 4 mg by mouth daily.  . Cyanocobalamin (VITAMIN B-12 IJ) Inject 1 mg as directed as directed. Every 3 months  . diclofenac sodium (VOLTAREN) 1 % GEL Apply 2 g topically 4 (four) times daily.  [name] dicyclomine (BENTYL) 20 MG tablet TAKE 1 TABLET BY MOUTH THREE TIMES DAILY  . eltrombopag (PROMACTA) 50 MG tablet Take by mouth.  . esomeprazole (NEXIUM) 40 MG capsule TAKE 1 CAPSULE(40 MG) BY MOUTH TWICE DAILY BEFORE A MEAL  . famotidine (PEPCID) 40 MG tablet TAKE 1 TABLET(40 MG) BY MOUTH TWICE DAILY  . fluticasone-salmeterol (ADVAIR HFA) 230-21 MCG/ACT inhaler Inhale 2 puffs into the lungs 2 (two) times daily.  . folic acid (FOLVITE) 1 MG tablet TAKE 1 TABLET(1 MG) BY MOUTH DAILY  . lipase/protease/amylase (CREON) 36000 UNITS CPEP capsule Take 2 capsules (72,000 Units total) by mouth 3 (three) times daily with meals. May also take 1 capsule (36,000 Units total) as needed (with snacks).  . mirabegron ER (MYRBETRIQ) 25 MG TB24 tablet Take 1 tablet (25 mg total) by mouth daily.  . nicotine (NICODERM CQ) 21 mg/24hr patch Place 1 patch (21 mg total) onto the skin daily.  . nicotine polacrilex (COMMIT) 4 MG lozenge Take 1 lozenge (4 mg total) by mouth as needed for smoking  cessation.  . predniSONE (DELTASONE) 10 MG tablet Take 2 tablets (20mg  total) daily for the next 5 days. Take in the AM with food.  . solifenacin (VESICARE) 10 MG tablet TAKE 1 TABLET(10 MG) BY MOUTH DAILY  . Tiotropium Bromide Monohydrate (SPIRIVA RESPIMAT) 2.5 MCG/ACT AERS Inhale 2 puffs into the lungs daily.  . traZODone (DESYREL) 150 MG tablet Take 1 tablet (150 mg total) by mouth at bedtime. For sleep (Patient taking differently: Take 300 mg by mouth at bedtime. For sleep)  . venlafaxine XR (EFFEXOR-XR) 75 MG 24 hr capsule Take 1 capsule (75 mg total) by mouth daily with breakfast. For depression  . [DISCONTINUED] dicyclomine (BENTYL) 20 MG tablet Take 1 tablet (20 mg total) by mouth as needed for spasms.   No facility-administered encounter medications on file as of 10/12/2020.     Review of Systems  Review of Systems  Constitutional: Positive for fatigue. Negative for activity change and fever.  HENT: Negative for sinus pressure, sinus pain and sore throat.   Respiratory: Positive for cough and shortness of breath. Negative for wheezing.   Cardiovascular: Negative for chest pain and palpitations.  Gastrointestinal: Negative for diarrhea, nausea and vomiting.  Musculoskeletal: Negative for arthralgias.  Neurological: Negative for dizziness.  Psychiatric/Behavioral: Negative for sleep disturbance. The patient is not nervous/anxious.      Physical Exam  BP 122/74 (BP Location: Left Arm, Cuff Size: Normal)   Pulse 91   Temp (!) 97.5 F (36.4 C) (Oral)   Ht 5\' 4"  (1.626 m)   Wt 249 lb 3.2 oz (113 kg)   LMP  (LMP Unknown)   SpO2 97%   BMI 42.78 kg/m   Wt Readings from Last 5 Encounters:  10/12/20 249 lb 3.2 oz (113 kg)  08/13/20 254 lb (115.2 kg)  07/02/20 239 lb (108.4 kg)  07/01/20 239 lb (108.4 kg)  05/20/20 246 lb 1.6 oz (111.6 kg)    BMI Readings from Last 5 Encounters:  10/12/20 42.78 kg/m  08/13/20 41.00 kg/m  07/02/20 38.58  kg/m  07/01/20 38.58 kg/m   05/20/20 39.72 kg/m     Physical Exam Vitals and nursing note reviewed.  Constitutional:      General: She is not in acute distress.    Appearance: Normal appearance. She is normal weight.  HENT:     Head: Normocephalic and atraumatic.     Right Ear: External ear normal.     Left Ear: External ear normal.     Nose: Nose normal.     Mouth/Throat:     Mouth: Mucous membranes are moist.     Pharynx: Oropharynx is clear.  Eyes:     Pupils: Pupils are equal, round, and reactive to light.  Cardiovascular:     Rate and Rhythm: Normal rate and regular rhythm.     Pulses: Normal pulses.     Heart sounds: Normal heart sounds. No murmur heard.   Pulmonary:     Breath sounds: No decreased air movement. Wheezing present. No decreased breath sounds or rales.  Musculoskeletal:     Cervical back: Normal range of motion.  Skin:    General: Skin is warm and dry.     Capillary Refill: Capillary refill takes less than 2 seconds.  Neurological:     General: No focal deficit present.     Mental Status: She is alert and oriented to person, place, and time. Mental status is at baseline.     Gait: Gait normal.  Psychiatric:        Mood and Affect: Mood normal.        Behavior: Behavior normal.        Thought Content: Thought content normal.        Judgment: Judgment normal.       Assessment & Plan:   COPD (chronic obstructive pulmonary disease) (HCC) Plan: Prednisone today Continue Advair 250 Trial of Spiriva Respimat 2.5 Emphasized strongly patient needs to stop smoking Nicotine replacement therapies prescribed today 4 to 6-week follow-up with Dr. Tonia Brooms or myself to ensure patient is decreasing smoking  Healthcare maintenance Strongly recommend patient receive her COVID-19 booster vaccination  Reviewed with patient she can obtain this at a commercial pharmacy she reports understanding and she has no further questions.  Tobacco use disorder Current smoker Smoker 1  pack/day 30+ pack year smoking history Husband is a smoker, interested in stopping smoking  not suitable candidate for Chantix  Plan: Nicotine replacement therapies prescribed today Consider referral to lung cancer screening program when patient is age-appropriate Emphasized need for patient to stop smoking    Return in about 4 weeks (around 11/09/2020), or if symptoms worsen or fail to improve, for Follow up with Elisha Headland FNP-C, Follow up with Dr. Tonia Brooms.   Coral Ceo, NP 10/12/2020   This appointment required 34 minutes of patient care (this includes precharting, chart review, review of results, face-to-face care, etc.).

## 2020-10-12 NOTE — Assessment & Plan Note (Signed)
Plan: Prednisone today Continue Advair 250 Trial of Spiriva Respimat 2.5 Emphasized strongly patient needs to stop smoking Nicotine replacement therapies prescribed today 4 to 6-week follow-up with Dr. Valeta Harms or myself to ensure patient is decreasing smoking

## 2020-10-12 NOTE — Progress Notes (Signed)
Thanks for seeing her Josephine Igo, DO [facility] 10/12/2020 5:20 PM

## 2020-10-12 NOTE — Assessment & Plan Note (Signed)
Current smoker Smoker 1 pack/day 30+ pack year smoking history Husband is a smoker, interested in stopping smoking  not suitable candidate for Chantix  Plan: Nicotine replacement therapies prescribed today Consider referral to lung cancer screening program when patient is age-appropriate Emphasized need for patient to stop smoking

## 2020-10-12 NOTE — Progress Notes (Deleted)
@Patient  ID: , female    DOB: 16-Nov-1970, 49 y.o.   MRN: [id number]  Chief Complaint  Patient presents with  . Follow-up    COPD, SOB seems to be worse, esp at night    Referring provider: [id number], NP  HPI:   PMH:  Smoker/ Smoking History: Current Smoker.  Maintenance:   Pt of:   10/12/2020  - Visit   15 on new years    Questionaires / Pulmonary Flowsheets:   ACT:  No flowsheet data found.  MMRC: No flowsheet data found.  Epworth:  No flowsheet data found.  Tests:    Chest Imaging: 08/07/2017 chest x-ray: Bronchiectatic changes.  Pulmonary Functions Testing Results: PFT Results Latest Ref Rng & Units 07/01/2019  FVC-Pre L 3.36  FVC-Predicted Pre % 82  FVC-Post L 3.62  FVC-Predicted Post % 88  Pre FEV1/FVC % % 58  Post FEV1/FCV % % 60  FEV1-Pre L 1.95  FEV1-Predicted Pre % 60  FEV1-Post L 2.18  DLCO UNC% % 91  DLCO COR %Predicted % 83  TLC L 7.65  TLC % Predicted % 135  RV % Predicted % 212    FeNO: None   Pathology: None   Echocardiogram: None   Heart Catheterization: None    FENO:  No results found for: NITRICOXIDE  PFT: PFT Results Latest Ref Rng & Units 07/01/2019  FVC-Pre L 3.36  FVC-Predicted Pre % 82  FVC-Post L 3.62  FVC-Predicted Post % 88  Pre FEV1/FVC % % 58  Post FEV1/FCV % % 60  FEV1-Pre L 1.95  FEV1-Predicted Pre % 60  FEV1-Post L 2.18  DLCO uncorrected ml/min/mmHg 22.11  DLCO UNC% % 91  DLCO corrected ml/min/mmHg 21.97  DLCO COR %Predicted % 90  DLVA Predicted % 83  TLC L 7.65  TLC % Predicted % 135  RV % Predicted % 212    WALK:  No flowsheet data found.  Imaging: No results found.  Lab Results:  CBC    Component Value Date/Time   WBC 4.8 05/20/2020 0803   RBC 4.52 05/20/2020 0803   HGB 13.7 05/20/2020 0803   HGB 11.7 (L) 09/21/2018 1000   HGB 10.3 (L) 08/01/2018 1154   HCT 41.4 05/20/2020 0803   HCT 32.9 (L) 08/01/2018 1154   PLT 37 (L) 05/20/2020 0803   PLT 15 (L)  09/21/2018 1000   PLT 37 (LL) 08/01/2018 1154   MCV 91.6 05/20/2020 0803   MCV 83 08/01/2018 1154   MCH 30.3 05/20/2020 0803   MCHC 33.1 05/20/2020 0803   RDW 15.8 (H) 05/20/2020 0803   RDW 15.4 08/01/2018 1154   LYMPHSABS 0.6 (L) 05/20/2020 0803   LYMPHSABS 0.9 08/01/2018 1154   MONOABS 0.4 05/20/2020 0803   EOSABS 0.1 05/20/2020 0803   EOSABS 0.0 08/01/2018 1154   BASOSABS 0.1 05/20/2020 0803   BASOSABS 0.0 08/01/2018 1154    BMET    Component Value Date/Time   NA 141 05/20/2020 0803   NA 140 07/04/2019 1110   K 4.3 05/20/2020 0803   CL 106 05/20/2020 0803   CO2 28 05/20/2020 0803   GLUCOSE 76 05/20/2020 0803   BUN 11 05/20/2020 0803   BUN 13 07/04/2019 1110   CREATININE 0.76 05/20/2020 0803   CREATININE 0.78 03/05/2020 1039   CREATININE 0.59 08/07/2017 1410   CALCIUM 9.0 05/20/2020 0803   GFRNONAA >60 05/20/2020 0803   GFRNONAA >60 03/05/2020 1039   GFRNONAA 110 08/07/2017 1410   GFRAA >60 05/20/2020  0803   GFRAA >60 03/05/2020 1039   GFRAA 127 08/07/2017 1410    BNP No results found for: BNP  ProBNP    Component Value Date/Time   PROBNP 22.0 09/30/2013 1234    Specialty Problems      Pulmonary Problems   COPD (chronic obstructive pulmonary disease) (HCC)   Emphysema      Allergies  Allergen Reactions  . Other     "Banana Bag" given to patient at physician's office, severe back pain, SOB    Immunization History  Administered Date(s) Administered  . Influenza,inj,Quad PF,6+ Mos 08/07/2017, 07/27/2018, 07/04/2019  . Influenza,inj,Quad PF,6-35 Mos 08/10/2020  . PFIZER SARS-COV-2 Vaccination 01/30/2020, 02/21/2020  . Pneumococcal Conjugate-13 07/27/2018  . Pneumococcal Polysaccharide-23 03/22/2014, 08/07/2017  . Tdap 08/07/2017    Past Medical History:  Diagnosis Date  . Abdominal pain   . Anemia   . Anxiety   . Bronchitis   . COPD (chronic obstructive pulmonary disease) ([place])   . Depression    bipolar  . Diarrhea   . Emphysema   .  GERD (gastroesophageal reflux disease)   . History of blood transfusion   . MDS (myelodysplastic syndrome) ([place])   . Splenomegaly    r/t low platelets  . Thrombocytopenia ([place])   . Tobacco abuse     Tobacco History: Social History   Tobacco Use  Smoking Status Current Every Day Smoker  . Packs/day: 1.00  . Years: 30.00  . Pack years: 30.00  . Types: Cigarettes  Smokeless Tobacco Never Used  Tobacco Comment   tobacco info given 10/28/2019   Ready to quit: No Counseling given: Yes Comment: tobacco info given 10/28/2019   Continue to not smoke  Outpatient Encounter Medications as of 10/12/2020  Medication Sig  . Accu-Chek Softclix Lancets lancets Use as instructed  . acetaminophen (TYLENOL) 500 MG tablet Take 1,000 mg by mouth daily as needed for moderate pain.  [name] albuterol (PROVENTIL) (2.5 MG/3ML) 0.083% nebulizer solution Take 3 mLs (2.5 mg total) by nebulization every 4 (four) hours as needed for wheezing or shortness of breath.  . busPIRone (BUSPAR) 5 MG tablet TAKE 1 TABLET(5 MG) BY MOUTH THREE TIMES DAILY  . cholecalciferol (VITAMIN D3) 25 MCG (1000 UT) tablet Take 5,000 Units by mouth daily.  . colestipol (COLESTID) 1 g tablet TAKE 3 TABLETS BY MOUTH EVERY MORNING AND TAKE 2 TABLETS BY MOUTH EVERY EVENING  . Copper Gluconate 2 MG CAPS Take 4 mg by mouth daily.  . Cyanocobalamin (VITAMIN B-12 IJ) Inject 1 mg as directed as directed. Every 3 months  . diclofenac sodium (VOLTAREN) 1 % GEL Apply 2 g topically 4 (four) times daily.  [name] dicyclomine (BENTYL) 20 MG tablet TAKE 1 TABLET BY MOUTH THREE TIMES DAILY  . eltrombopag (PROMACTA) 50 MG tablet Take by mouth.  . esomeprazole (NEXIUM) 40 MG capsule TAKE 1 CAPSULE(40 MG) BY MOUTH TWICE DAILY BEFORE A MEAL  . famotidine (PEPCID) 40 MG tablet TAKE 1 TABLET(40 MG) BY MOUTH TWICE DAILY  . fluticasone-salmeterol (ADVAIR HFA) 230-21 MCG/ACT inhaler Inhale 2 puffs into the lungs 2 (two) times daily.  . folic acid (FOLVITE) 1 MG  tablet TAKE 1 TABLET(1 MG) BY MOUTH DAILY  . lipase/protease/amylase (CREON) 36000 UNITS CPEP capsule Take 2 capsules (72,000 Units total) by mouth 3 (three) times daily with meals. May also take 1 capsule (36,000 Units total) as needed (with snacks).  . mirabegron ER (MYRBETRIQ) 25 MG TB24 tablet Take 1 tablet (25 mg total) by mouth  daily.  . solifenacin (VESICARE) 10 MG tablet TAKE 1 TABLET(10 MG) BY MOUTH DAILY  . traZODone (DESYREL) 150 MG tablet Take 1 tablet (150 mg total) by mouth at bedtime. For sleep (Patient taking differently: Take 300 mg by mouth at bedtime. For sleep)  . venlafaxine XR (EFFEXOR-XR) 75 MG 24 hr capsule Take 1 capsule (75 mg total) by mouth daily with breakfast. For depression  . [DISCONTINUED] dicyclomine (BENTYL) 20 MG tablet Take 1 tablet (20 mg total) by mouth as needed for spasms.   No facility-administered encounter medications on file as of 10/12/2020.     Review of Systems  Review of Systems   Physical Exam  BP 122/74 (BP Location: Left Arm, Cuff Size: Normal)   Pulse 91   Temp (!) 97.5 F (36.4 C) (Oral)   Ht 5\' 4"  (1.626 m)   Wt 249 lb 3.2 oz (113 kg)   LMP  (LMP Unknown)   SpO2 97%   BMI 42.78 kg/m   Wt Readings from Last 5 Encounters:  10/12/20 249 lb 3.2 oz (113 kg)  08/13/20 254 lb (115.2 kg)  07/02/20 239 lb (108.4 kg)  07/01/20 239 lb (108.4 kg)  05/20/20 246 lb 1.6 oz (111.6 kg)    BMI Readings from Last 5 Encounters:  10/12/20 42.78 kg/m  08/13/20 41.00 kg/m  07/02/20 38.58 kg/m  07/01/20 38.58 kg/m  05/20/20 39.72 kg/m     Physical Exam    Assessment & Plan:   No problem-specific Assessment & Plan notes found for this encounter.    No follow-ups on file.   07/20/20, NP 10/12/2020   This appointment required *** minutes of patient care (this includes precharting, chart review, review of results, face-to-face care, etc.).

## 2020-10-12 NOTE — Assessment & Plan Note (Signed)
Strongly recommend patient receive her COVID-19 booster vaccination  Reviewed with patient she can obtain this at a commercial pharmacy she reports understanding and she has no further questions.

## 2020-10-24 ENCOUNTER — Other Ambulatory Visit: Payer: Self-pay | Admitting: Nurse Practitioner

## 2020-10-26 ENCOUNTER — Other Ambulatory Visit: Payer: Self-pay | Admitting: Gastroenterology

## 2020-11-04 ENCOUNTER — Encounter: Payer: Self-pay | Admitting: Nurse Practitioner

## 2020-11-06 ENCOUNTER — Other Ambulatory Visit: Payer: Self-pay | Admitting: Gastroenterology

## 2020-11-09 ENCOUNTER — Other Ambulatory Visit: Payer: Self-pay

## 2020-11-09 ENCOUNTER — Encounter: Payer: Self-pay | Admitting: Pulmonary Disease

## 2020-11-09 ENCOUNTER — Ambulatory Visit (INDEPENDENT_AMBULATORY_CARE_PROVIDER_SITE_OTHER): Payer: Medicaid Other | Admitting: Pulmonary Disease

## 2020-11-09 VITALS — BP 122/74 | HR 73 | Temp 98.0°F | Ht 66.0 in | Wt 254.4 lb

## 2020-11-09 DIAGNOSIS — F17200 Nicotine dependence, unspecified, uncomplicated: Secondary | ICD-10-CM | POA: Diagnosis not present

## 2020-11-09 DIAGNOSIS — Z0000 Encounter for general adult medical examination without abnormal findings: Secondary | ICD-10-CM

## 2020-11-09 DIAGNOSIS — J449 Chronic obstructive pulmonary disease, unspecified: Secondary | ICD-10-CM | POA: Diagnosis not present

## 2020-11-09 MED ORDER — SPIRIVA RESPIMAT 2.5 MCG/ACT IN AERS: 2.0000 | INHALATION_SPRAY | Freq: Every day | RESPIRATORY_TRACT | 0 refills | Status: DC

## 2020-11-09 NOTE — Assessment & Plan Note (Signed)
Current smoker Currently smoking 5 cigarettes a day, actively working on stopping 30+ pack year smoking history Husband is a smoker, interested in stopping smoking -husband reports that he will not buy any more cigarettes not suitable candidate for Chantix  Plan: Continue to work on stopping smoking Set quit date Referred to lung cancer screening program as patient will turn 50 in May/2022

## 2020-11-09 NOTE — Progress Notes (Signed)
@Patient  ID: Barbara Stanley, female    DOB: 06-26-1971, 50 y.o.   MRN: [id number]  Chief Complaint  Patient presents with  . Follow-up    COPD, doing well    Referring provider: [name], NP  HPI:  50 year old female current everyday smoker followed in our office for COPD  PMH: GERD, anemia, depression, anxiety, PTSD, history of suicide attempts Smoker/ Smoking History: Current everyday smoker.  1 pack/day.  30-pack-year history. Maintenance: Advair 250, Spiriva 2.5 Pt of: Dr. Valeta Harms  11/09/2020  - Visit   50 year old female current everyday smoker followed in our office for COPD.  Patient is established with Dr. Valeta Harms.  Patient completing a 4-week follow-up with our office today.  Patient reports that breathing has significantly improved since adding Spiriva Respimat last office visit.  Patient is also aggressively working on stopping smoking.  She is down to 5 cigarettes a day.  She reports that her husband is also working on stopping smoking.  Patient is quite proud of her progress.  Patient reports adherence to Advair 250 and Spiriva Respimat 2.5.  Patient occasionally has some clear nasal drainage and will cough up white to clear mucus.  She feels that this is her baseline.  Questionaires / Pulmonary Flowsheets:   ACT:  No flowsheet data found.  MMRC: No flowsheet data found.  Epworth:  No flowsheet data found.  Tests:   FENO:  No results found for: NITRICOXIDE  PFT: PFT Results Latest Ref Rng & Units 07/01/2019  FVC-Pre L 3.36  FVC-Predicted Pre % 82  FVC-Post L 3.62  FVC-Predicted Post % 88  Pre FEV1/FVC % % 58  Post FEV1/FCV % % 60  FEV1-Pre L 1.95  FEV1-Predicted Pre % 60  FEV1-Post L 2.18  DLCO uncorrected ml/min/mmHg 22.11  DLCO UNC% % 91  DLCO corrected ml/min/mmHg 21.97  DLCO COR %Predicted % 90  DLVA Predicted % 83  TLC L 7.65  TLC % Predicted % 135  RV % Predicted % 212    WALK:  No flowsheet data found.  Imaging: No results  found.  Lab Results:  CBC    Component Value Date/Time   WBC 4.8 05/20/2020 0803   RBC 4.52 05/20/2020 0803   HGB 13.7 05/20/2020 0803   HGB 11.7 (L) 09/21/2018 1000   HGB 10.3 (L) 08/01/2018 1154   HCT 41.4 05/20/2020 0803   HCT 32.9 (L) 08/01/2018 1154   PLT 37 (L) 05/20/2020 0803   PLT 15 (L) 09/21/2018 1000   PLT 37 (LL) 08/01/2018 1154   MCV 91.6 05/20/2020 0803   MCV 83 08/01/2018 1154   MCH 30.3 05/20/2020 0803   MCHC 33.1 05/20/2020 0803   RDW 15.8 (H) 05/20/2020 0803   RDW 15.4 08/01/2018 1154   LYMPHSABS 0.6 (L) 05/20/2020 0803   LYMPHSABS 0.9 08/01/2018 1154   MONOABS 0.4 05/20/2020 0803   EOSABS 0.1 05/20/2020 0803   EOSABS 0.0 08/01/2018 1154   BASOSABS 0.1 05/20/2020 0803   BASOSABS 0.0 08/01/2018 1154    BMET    Component Value Date/Time   NA 141 05/20/2020 0803   NA 140 07/04/2019 1110   K 4.3 05/20/2020 0803   CL 106 05/20/2020 0803   CO2 28 05/20/2020 0803   GLUCOSE 76 05/20/2020 0803   BUN 11 05/20/2020 0803   BUN 13 07/04/2019 1110   CREATININE 0.76 05/20/2020 0803   CREATININE 0.78 03/05/2020 1039   CREATININE 0.59 08/07/2017 1410   CALCIUM 9.0 05/20/2020 0803   GFRNONAA >  60 05/20/2020 0803   GFRNONAA >60 03/05/2020 1039   GFRNONAA 110 08/07/2017 1410   GFRAA >60 05/20/2020 0803   GFRAA >60 03/05/2020 1039   GFRAA 127 08/07/2017 1410    BNP No results found for: BNP  ProBNP    Component Value Date/Time   PROBNP 22.0 09/30/2013 1234    Specialty Problems      Pulmonary Problems   COPD (chronic obstructive pulmonary disease) (HCC)      Allergies  Allergen Reactions  . Other     "Banana Bag" given to patient at physician's office, severe back pain, SOB    Immunization History  Administered Date(s) Administered  . Influenza,inj,Quad PF,6+ Mos 08/07/2017, 07/27/2018, 07/04/2019  . Influenza,inj,Quad PF,6-35 Mos 08/10/2020  . PFIZER(Purple Top)SARS-COV-2 Vaccination 01/30/2020, 02/21/2020  . Pneumococcal Conjugate-13  07/27/2018  . Pneumococcal Polysaccharide-23 03/22/2014, 08/07/2017  . Tdap 08/07/2017    Past Medical History:  Diagnosis Date  . Abdominal pain   . Anemia   . Anxiety   . Bronchitis   . COPD (chronic obstructive pulmonary disease) ([place])   . Depression    bipolar  . Diarrhea   . Emphysema   . GERD (gastroesophageal reflux disease)   . History of blood transfusion   . MDS (myelodysplastic syndrome) ([place])   . Splenomegaly    r/t low platelets  . Thrombocytopenia ([place])   . Tobacco abuse     Tobacco History: Social History   Tobacco Use  Smoking Status Current Every Day Smoker  . Packs/day: 1.00  . Years: 30.00  . Pack years: 30.00  . Types: Cigarettes  Smokeless Tobacco Never Used  Tobacco Comment   5 ciggs daily-11/09/2020-AH   Ready to quit: No Counseling given: Yes Comment: 5 ciggs daily-11/09/2020-AH  Smoking assessment and cessation counseling  Patient currently smoking: 5 cigarettes  I have advised the patient to quit/stop smoking as soon as possible due to high risk for multiple medical problems.  It will also be very difficult for [place] to manage patient's  respiratory symptoms and status if we continue to expose her lungs to a known irritant.  We do not advise e-cigarettes as a form of stopping smoking.  Patient is  willing to quit smoking.  Has not set quit date yet.  Actively working to get to 0 cigarettes.  I have advised the patient that we can assist and have options of nicotine replacement therapy, provided smoking cessation education today, provided smoking cessation counseling, and provided cessation resources.  Follow-up next office visit office visit for assessment of smoking cessation.  Goal: Be down to 0 cigarettes at next follow-up in 3 months.  Referred to lung cancer screening as patient will turn 50 in May/2022  Smoking cessation counseling advised for: 3 min    Outpatient Encounter Medications as of 11/09/2020  Medication Sig  .  Accu-Chek Softclix Lancets lancets Use as instructed  . acetaminophen (TYLENOL) 500 MG tablet Take 1,000 mg by mouth daily as needed for moderate pain.  [name] albuterol (PROVENTIL) (2.5 MG/3ML) 0.083% nebulizer solution Take 3 mLs (2.5 mg total) by nebulization every 4 (four) hours as needed for wheezing or shortness of breath.  . busPIRone (BUSPAR) 5 MG tablet TAKE 1 TABLET(5 MG) BY MOUTH THREE TIMES DAILY  . cholecalciferol (VITAMIN D3) 25 MCG (1000 UT) tablet Take 5,000 Units by mouth daily.  . colestipol (COLESTID) 1 g tablet TAKE 3 TABLETS BY MOUTH EVERY MORNING AND TAKE 2 TABLETS BY MOUTH EVERY EVENING  . Copper Gluconate  2 MG CAPS Take 4 mg by mouth daily.  . Cyanocobalamin (VITAMIN B-12 IJ) Inject 1 mg as directed as directed. Every 3 months  . diclofenac sodium (VOLTAREN) 1 % GEL Apply 2 g topically 4 (four) times daily.  [name] dicyclomine (BENTYL) 20 MG tablet TAKE 1 TABLET BY MOUTH THREE TIMES DAILY  . eltrombopag (PROMACTA) 50 MG tablet Take by mouth.  . esomeprazole (NEXIUM) 40 MG capsule TAKE 1 CAPSULE(40 MG) BY MOUTH TWICE DAILY BEFORE A MEAL  . famotidine (PEPCID) 40 MG tablet TAKE 1 TABLET(40 MG) BY MOUTH TWICE DAILY  . fluticasone-salmeterol (ADVAIR HFA) 230-21 MCG/ACT inhaler Inhale 2 puffs into the lungs 2 (two) times daily.  . folic acid (FOLVITE) 1 MG tablet TAKE 1 TABLET(1 MG) BY MOUTH DAILY  . lipase/protease/amylase (CREON) 36000 UNITS CPEP capsule Take 2 capsules (72,000 Units total) by mouth 3 (three) times daily with meals. May also take 1 capsule (36,000 Units total) as needed (with snacks).  . mirabegron ER (MYRBETRIQ) 25 MG TB24 tablet Take 1 tablet (25 mg total) by mouth daily.  . nicotine (NICODERM CQ) 21 mg/24hr patch Place 1 patch (21 mg total) onto the skin daily.  . nicotine polacrilex (COMMIT) 4 MG lozenge Take 1 lozenge (4 mg total) by mouth as needed for smoking cessation.  . predniSONE (DELTASONE) 10 MG tablet Take 2 tablets (20mg  total) daily for the next 5 days.  Take in the AM with food.  . solifenacin (VESICARE) 10 MG tablet TAKE 1 TABLET(10 MG) BY MOUTH DAILY  . traZODone (DESYREL) 150 MG tablet Take 1 tablet (150 mg total) by mouth at bedtime. For sleep (Patient taking differently: Take 300 mg by mouth at bedtime. For sleep)  . Venlafaxine HCl 225 MG TB24 Take 1 tablet by mouth daily.  [name] venlafaxine XR (EFFEXOR-XR) 75 MG 24 hr capsule Take 1 capsule (75 mg total) by mouth daily with breakfast. For depression  . [DISCONTINUED] Tiotropium Bromide Monohydrate (SPIRIVA RESPIMAT) 2.5 MCG/ACT AERS Inhale 2 puffs into the lungs daily.  . Tiotropium Bromide Monohydrate (SPIRIVA RESPIMAT) 2.5 MCG/ACT AERS Inhale 2 puffs into the lungs daily.  . [DISCONTINUED] Tiotropium Bromide Monohydrate (SPIRIVA RESPIMAT) 2.5 MCG/ACT AERS Inhale 2 puffs into the lungs daily.  . [DISCONTINUED] Tiotropium Bromide Monohydrate (SPIRIVA RESPIMAT) 2.5 MCG/ACT AERS Inhale 2 puffs into the lungs daily.   No facility-administered encounter medications on file as of 11/09/2020.     Review of Systems  Review of Systems  Constitutional: Negative for activity change, fatigue and fever.  HENT: Positive for congestion. Negative for sinus pressure, sinus pain and sore throat.   Respiratory: Positive for cough. Negative for shortness of breath and wheezing.   Cardiovascular: Negative for chest pain and palpitations.  Gastrointestinal: Negative for diarrhea, nausea and vomiting.  Musculoskeletal: Negative for arthralgias.  Neurological: Negative for dizziness.  Psychiatric/Behavioral: Negative for sleep disturbance. The patient is not nervous/anxious.      Physical Exam  BP 122/74 (BP Location: Left Arm, Cuff Size: Normal)   Pulse 73   Temp 98 F (36.7 C) (Oral)   Ht 5\' 6"  (1.676 m)   Wt 254 lb 6.4 oz (115.4 kg)   LMP  (LMP Unknown)   SpO2 97%   BMI 41.06 kg/m   Wt Readings from Last 5 Encounters:  11/09/20 254 lb 6.4 oz (115.4 kg)  10/12/20 249 lb 3.2 oz (113 kg)   08/13/20 254 lb (115.2 kg)  07/02/20 239 lb (108.4 kg)  07/01/20 239 lb (108.4 kg)  BMI Readings from Last 5 Encounters:  11/09/20 41.06 kg/m  10/12/20 42.78 kg/m  08/13/20 41.00 kg/m  07/02/20 38.58 kg/m  07/01/20 38.58 kg/m     Physical Exam Vitals and nursing note reviewed.  Constitutional:      General: She is not in acute distress.    Appearance: Normal appearance. She is obese.  HENT:     Head: Normocephalic and atraumatic.     Right Ear: External ear normal. There is no impacted cerumen.     Left Ear: External ear normal. There is no impacted cerumen.     Nose: Nose normal. No congestion.     Mouth/Throat:     Mouth: Mucous membranes are moist.     Pharynx: Oropharynx is clear.  Eyes:     Pupils: Pupils are equal, round, and reactive to light.  Cardiovascular:     Rate and Rhythm: Normal rate and regular rhythm.     Pulses: Normal pulses.     Heart sounds: Normal heart sounds. No murmur heard.   Pulmonary:     Effort: Pulmonary effort is normal. No respiratory distress.     Breath sounds: Normal breath sounds. No decreased air movement. No decreased breath sounds, wheezing or rales.  Musculoskeletal:     Cervical back: Normal range of motion.     Right lower leg: Edema (trace) present.     Left lower leg: Edema (trace) present.  Skin:    General: Skin is warm and dry.     Capillary Refill: Capillary refill takes less than 2 seconds.  Neurological:     General: No focal deficit present.     Mental Status: She is alert and oriented to person, place, and time. Mental status is at baseline.     Gait: Gait normal.  Psychiatric:        Mood and Affect: Mood normal.        Behavior: Behavior normal.        Thought Content: Thought content normal.        Judgment: Judgment normal.       Assessment & Plan:   COPD (chronic obstructive pulmonary disease) ([place]) Congratulated patient on her success of decreasing her smoking   Plan: Continue Advair  250 Continue Spiriva Respimat 2.5 If patient successfully stop smoking could consider removing ICS at next office visit as patient's eosinophil counts have never been elevated above 300 Referral to lung cancer screening program as patient will turn 50 in May/2022 Work on increasing overall physical activity  Healthcare maintenance Patient scheduled for COVID-19 booster vaccination today  Plan: Obtain COVID-19 booster as scheduled  Tobacco use disorder Current smoker Currently smoking 5 cigarettes a day, actively working on stopping 30+ pack year smoking history Husband is a smoker, interested in stopping smoking -husband reports that he will not buy any more cigarettes not suitable candidate for Chantix  Plan: Continue to work on stopping smoking Set quit date Referred to lung cancer screening program as patient will turn 50 in May/2022    Return in about 3 months (around 02/07/2021), or if symptoms worsen or fail to improve, for Follow up with Dr. Valeta Harms, Follow up with Wyn Quaker FNP-C.   Lauraine Rinne, NP 11/09/2020   This appointment required 32 minutes of patient care (this includes precharting, chart review, review of results, face-to-face care, etc.).

## 2020-11-09 NOTE — Assessment & Plan Note (Signed)
Congratulated patient on her success of decreasing her smoking   Plan: Continue Advair 250 Continue Spiriva Respimat 2.5 If patient successfully stop smoking could consider removing ICS at next office visit as patient's eosinophil counts have never been elevated above 300 Referral to lung cancer screening program as patient will turn 50 in May/2022 Work on increasing overall physical activity

## 2020-11-09 NOTE — Assessment & Plan Note (Signed)
Patient scheduled for COVID-19 booster vaccination today  Plan: Obtain COVID-19 booster as scheduled

## 2020-11-09 NOTE — Patient Instructions (Addendum)
You were seen today by Lauraine Rinne, NP  for:   1. Chronic obstructive pulmonary disease, unspecified COPD type (HCC)  Continue Advair 250  Spiriva Respimat 2.5 >>> 2 puffs daily >>> Do this every day >>>This is not a rescue inhaler  Note your daily symptoms > remember "red flags" for COPD:   >>>Increase in cough >>>increase in sputum production >>>increase in shortness of breath or activity  intolerance.   If you notice these symptoms, please call the office to be seen.   2. Tobacco use disorder  - Ambulatory Referral for Lung Cancer Scre  We recommend that you stop smoking.  >>>You need to set a quit date >>>If you have friends or family who smoke, let them know you are trying to quit and not to smoke around you or in your living environment  Smoking Cessation Resources:  1 800 QUIT NOW  >>> Patient to call this resource and utilize it to help support her quit smoking >>> Keep up your hard work with stopping smoking  You can also contact the [facility] >>>For smoking cessation classes call [phone]  We do not recommend using e-cigarettes as a form of stopping smoking  You can sign up for smoking cessation support texts and information:  >>>https://smokefree.gov/smokefreetxt  We will refer you today to our lung cancer screening program >>>This is based off of your 30 pack-year smoking history >>> This is a recommendation from the [place] preventative services task force (USPSTF) >>>The USPSTF recommends annual screening for lung cancer with low-dose computed tomography (LDCT) in adults aged 42 to 39 years who have a 20 pack-year smoking history and currently smoke or have quit within the past 15 years. Screening should be discontinued once a person has not smoked for 15 years or develops a health problem that substantially limits life expectancy or the ability or willingness to have curative lung surgery.   Our office will call you and set up an appointment  with Eric Form (Nurse Practitioner) who leads this program.  This appointment takes place in our office.  After completing this meeting with Eric Form NP you will get a low-dose CT as the screening >>>We will call you with those results    3. Healthcare maintenance  Great job being scheduled for your COVID-19 booster  Proceed forward with receiving this   We recommend today:  Orders Placed This Encounter  Procedures  . Ambulatory Referral for Lung Cancer Scre    Referral Priority:   Routine    Referral Type:   Consultation    Referral Reason:   Specialty Services Required    Number of Visits Requested:   1   Orders Placed This Encounter  Procedures  . Ambulatory Referral for Lung Cancer Scre   Meds ordered this encounter  Medications  . DISCONTD: Tiotropium Bromide Monohydrate (SPIRIVA RESPIMAT) 2.5 MCG/ACT AERS    Sig: Inhale 2 puffs into the lungs daily.    Dispense:  1 each    Refill:  0    Order Specific Question:   Lot Number?    Answer:   [id number]    Order Specific Question:   Expiration Date?    Answer:   04/15/2022    Order Specific Question:   Manufacturer?    Answer:   [employer] [[id number]]    Order Specific Question:   Quantity    Answer:   2  . Tiotropium Bromide Monohydrate (SPIRIVA RESPIMAT) 2.5 MCG/ACT AERS    Sig: Inhale  2 puffs into the lungs daily.    Dispense:  1 each    Refill:  0    Order Specific Question:   Lot Number?    Answer:   [id number]    Order Specific Question:   Expiration Date?    Answer:   04/15/2022    Order Specific Question:   Manufacturer?    Answer:   [employer] [[id number]]    Order Specific Question:   Quantity    Answer:   2    Follow Up:    Return in about 3 months (around 02/07/2021), or if symptoms worsen or fail to improve, for Follow up with Dr. Valeta Harms, Follow up with Wyn Quaker FNP-C.   Notification of test results are managed in the following manner: If there are  any recommendations or changes to the  plan of care  discussed in office today,  we will contact you and let you know what they are. If you do not hear from [place], then your results are normal and you can view them through your  MyChart account , or a letter will be sent to you. Thank you again for trusting [place] with your care  - Thank you, [facility]    It is flu season:   >>> Best ways to protect herself from the flu: Receive the yearly flu vaccine, practice good hand hygiene washing with soap and also using hand sanitizer when available, eat a nutritious meals, get adequate rest, hydrate appropriately       Please contact the office if your symptoms worsen or you have concerns that you are not improving.   Thank you for choosing [facility] for your healthcare, and for allowing [place] to partner with you on your healthcare journey. I am thankful to be able to provide care to you today.   Wyn Quaker FNP-C   American Journal of Respiratory and Critical Care Medicine, [phone]), e5-e31. http://knight.com/.875643-3295JO">  Health Risks of Smoking Smoking tobacco is very bad for your health. Tobacco smoke contains many toxic chemicals that can damage every part of your body. Secondhand smoke can be harmful to those around you. Tobacco or nicotine use can cause many long-term (chronic) diseases. Smoking is difficult to quit because a chemical in tobacco, called nicotine, causes addiction or dependence. When you smoke and inhale, nicotine is absorbed quickly into the bloodstream through your lungs. Both inhaled and non-inhaled nicotine may be addictive. How can quitting affect me? There are health benefits of quitting smoking. Some benefits happen right away and others take time. Benefits may include:  Blood flow, blood pressure, heart rate, and lung capacity may begin to improve. However, any lung damage that has already occurred cannot be repaired.  Temporary respiratory symptoms, such as nasal congestion and cough, may  improve over time.  Your risk of heart disease, stroke, and cancer is reduced.  The overall quality of your health may improve.  You may save money, as you will not spend money on tobacco products and may spend less money on smoking-related health issues. What can increase my risk? Smoking harms nearly every organ in the body. People who smoke tobacco have a shorter life expectancy and an increased risk of many serious medical problems. These include:  More respiratory infections, such as colds and pneumonia.  Cancer.  Heart disease.  Stroke.  Chronic respiratory diseases.  Delayed wound healing and increased risk of complications during surgery.  Problems with reproduction, pregnancy, and childbirth, such as infertility, early (  premature) births, stillbirths, and birth defects. Secondhand smoke exposure to children increases the risk of:  Sudden infant death syndrome (SIDS).  Infections in the nose, throat, or airways (respiratory infections).  Chronic respiratory symptoms.   What actions can I take to quit? Smoking is an addiction that affects both your body and your mind, and long-time habits can be hard to change. Your health care provider can recommend:  Nicotine replacement products, such as patches, gum, and nasal sprays. Use these products only as directed. Do not replace cigarette smoking with electronic cigarettes, which are commonly called e-cigarettes. The safety of e-cigarettes is not known, and some may contain harmful chemicals.  Programs and community resources, which may include group support, education, or talk therapy.  Prescription medicines to help reduce cravings.  A combination of two or more quit methods, which will increase the success of quitting.   Where to find support Follow the recommendations from your health care provider about support groups and other assistance. You can also visit:  [employer]: www.naquitline.org or  call 1-800-QUIT-NOW.  U.S. Department of Health and Human Services: www.smokefree.gov  American Lung Association: www.freedomfromsmoking.org  American Heart Association: www.heart.org Where to find more information  Centers for Disease Control and Prevention: http://www.wolf.info/  World Health Organization: RoleLink.com.br Summary  Smoking tobacco is very bad for your health. Tobacco smoke contains many toxic chemicals that can damage every part of the body.  Smoking is difficult to quit because a chemical in tobacco, called nicotine, causes addiction or dependence.  There are immediate and long-term health benefits of quitting smoking.  A combination of two or more quit methods increases the success of quitting. This information is not intended to replace advice given to you by your health care provider. Make sure you discuss any questions you have with your health care provider. Document Revised: 11/18/2019 Document Reviewed: 11/18/2019 Elsevier Patient Education  2021 [employer].    COPD and Physical Activity Chronic obstructive pulmonary disease (COPD) is a long-term (chronic) condition that affects the lungs. COPD is a general term that can be used to describe many different lung problems that cause lung swelling (inflammation) and limit airflow, including chronic bronchitis and emphysema. The main symptom of COPD is shortness of breath, which makes it harder to do even simple tasks. This can also make it harder to exercise and be active. Talk with your health care provider about treatments to help you breathe better and actions you can take to prevent breathing problems during physical activity. What are the benefits of exercising with COPD? Exercising regularly is an important part of a healthy lifestyle. You can still exercise and do physical activities even though you have COPD. Exercise and physical activity improve your shortness of breath by increasing blood flow (circulation). This  causes your heart to pump more oxygen through your body. Moderate exercise can improve your:  Oxygen use.  Energy level.  Shortness of breath.  Strength in your breathing muscles.  Heart health.  Sleep.  Self-esteem and feelings of self-worth.  Depression, stress, and anxiety levels. Exercise can benefit everyone with COPD. The severity of your disease may affect how hard you can exercise, especially at first, but everyone can benefit. Talk with your health care provider about how much exercise is safe for you, and which activities and exercises are safe for you.   What actions can I take to prevent breathing problems during physical activity?  Sign up for a pulmonary rehabilitation program. This type of  program may include: ? Education about lung diseases. ? Exercise classes that teach you how to exercise and be more active while improving your breathing. This usually involves:  Exercise using your lower extremities, such as a stationary bicycle.  About 30 minutes of exercise, 2 to 5 times per week, for 6 to 12 weeks  Strength training, such as push ups or leg lifts. ? Nutrition education. ? Group classes in which you can talk with others who also have COPD and learn ways to manage stress.  If you use an oxygen tank, you should use it while you exercise. Work with your health care provider to adjust your oxygen for your physical activity. Your resting flow rate is different from your flow rate during physical activity.  While you are exercising: ? Take slow breaths. ? Pace yourself and do not try to go too fast. ? Purse your lips while breathing out. Pursing your lips is similar to a kissing or whistling position. ? If doing exercise that uses a quick burst of effort, such as weight lifting:  Breathe in before starting the exercise.  Breathe out during the hardest part of the exercise (such as raising the weights). Where to find support You can find support for exercising  with COPD from:  Your health care provider.  A pulmonary rehabilitation program.  Your local health department or community health programs.  Support groups, online or in-person. Your health care provider may be able to recommend support groups. Where to find more information You can find more information about exercising with COPD from:  American Lung Association: ClassInsider.se.  COPD Foundation: https://www.rivera.net/. Contact a health care provider if:  Your symptoms get worse.  You have chest pain.  You have nausea.  You have a fever.  You have trouble talking or catching your breath.  You want to start a new exercise program or a new activity. Summary  COPD is a general term that can be used to describe many different lung problems that cause lung swelling (inflammation) and limit airflow. This includes chronic bronchitis and emphysema.  Exercise and physical activity improve your shortness of breath by increasing blood flow (circulation). This causes your heart to provide more oxygen to your body.  Contact your health care provider before starting any exercise program or new activity. Ask your health care provider what exercises and activities are safe for you. This information is not intended to replace advice given to you by your health care provider. Make sure you discuss any questions you have with your health care provider. Document Revised: 01/23/2019 Document Reviewed: 10/26/2017 Elsevier Patient Education  2021 [employer].

## 2020-11-11 ENCOUNTER — Other Ambulatory Visit: Payer: Self-pay | Admitting: Gastroenterology

## 2020-11-13 ENCOUNTER — Other Ambulatory Visit: Payer: Self-pay | Admitting: Nurse Practitioner

## 2020-11-13 ENCOUNTER — Encounter: Payer: Self-pay | Admitting: Nurse Practitioner

## 2020-12-20 ENCOUNTER — Other Ambulatory Visit: Payer: Self-pay | Admitting: Pulmonary Disease

## 2021-01-07 ENCOUNTER — Other Ambulatory Visit: Payer: Self-pay | Admitting: Family Medicine

## 2021-01-07 DIAGNOSIS — E162 Hypoglycemia, unspecified: Secondary | ICD-10-CM

## 2021-01-17 ENCOUNTER — Other Ambulatory Visit: Payer: Self-pay | Admitting: Gastroenterology

## 2021-02-08 NOTE — Progress Notes (Signed)
@Patient  ID: Barbara Stanley, female    DOB: 27-Jan-1971, 50 y.o.   MRN: [id number]  Chief Complaint  Patient presents with  . Follow-up    No current respiratory complaints.     Referring provider: [name], NP  HPI: 51 year old female, current everyday smoker (30-pack-year history).  Past medical history significant for chronic obstructive pulmonary disease, GERD, chronic idiopathic thrombocytopenia, GERD depressive disorder, PTSD, side attempt, chronic splenomegaly, status post gastric bypass 2003.  Patient of Dr. Valeta Harms, last seen in office by pulmonary nurse practitioner on 11/09/2020.  Patient was referred to pulmonary by primary care visit.  She was also advised to quit smoking.  Longtime smoker.  No prior pulmonary function tests. She was told in the past that she has chronic bronchitis. She used Symbicort and Spiriva HandiHaler. Smoked since age 64, continues to smoke 1 ppd. She has always had difficult quitting. She has used Wellbutrin in the past. She has mental health issues and feels uncomfortable with using chantix.  She still has significant respiratory symptoms with exertion.  She also notices heat and humidity makes her symptoms worse.  She does have nocturnal wheezing and shortness of breath.  Previous [facility] pulmonary encounters: OV 07/03/2019-Icard  Patient here for follow-up regarding recent initiation of Trelegy.  Had pulmonary function test completed at the beginning of the week with an FEV1 of 67%.  New diagnosis of stage II COPD.  CT imaging with evidence of emphysema as well.  Overall since starting new inhaler she is breathing somewhat better.  She has tapered herself down from a full pack per day of smoking to two thirds of a pack of cigarettes per day.  She is trying to quit however is exposed to ongoing secondhand smoke from her family as well.  Patient has daily cough and occasional wheezing.  Denies hemoptysis, fevers night sweats weight loss.   04/10/2020-  Barbara Stanley Breathing wise she is doing well, no acute respiratory complaints. Compliant with Trelegy and has noticed a significant improvement in her breathing since starting this medication. Reports that she rarely needs to use her albuterol rescue inhaler. She uses her nebulizer when its very hot or humid outside, she has only needed to use this once in the last several months. She is still smoking, she was previously smoking 2ppd and has cut down to 1/2 ppd. It is hard for her to quit because her husband smokes. She is not a candidate for chantix d/t hx MDD. Her goal is to get down to 5 cigarettes and she will then consider a quit date. She will use nicotine patches to help assist her when she is ready.   11/09/20-Barbara Stanley 50 year old female current everyday smoker followed in our office for COPD.  Patient is established with Dr. Valeta Harms.  Patient completing a 4-week follow-up with our office today.  Patient reports that breathing has significantly improved since adding Spiriva Respimat last office visit.  Patient is also aggressively working on stopping smoking.  She is down to 5 cigarettes a day.  She reports that her husband is also working on stopping smoking.  Patient is quite proud of her progress.  Patient reports adherence to Advair 250 and Spiriva Respimat 2.5.  Patient occasionally has some clear nasal drainage and will cough up white to clear mucus.  She feels that this is her baseline.  02/09/2021 - Interim hx Patient presents today for 3 month follow-up COPD. She is doing well, no acute complaints. No recent exacerbations. She is compliant with  Advair HFA and Spiriva Respimat. She has not needed to use her rescue inhaler in months. She quit smoking a few weeks ago, still finds it tempting. She would like to be referred to lung cancer screening program, she will be turning 50 next month and has 30 pack year hx.  Denies f/c/s, chest tightness, wheezing , cough or shortness of breath.    Allergies   Allergen Reactions  . Other     "Banana Bag" given to patient at physician's office, severe back pain, SOB    Immunization History  Administered Date(s) Administered  . Influenza,inj,Quad PF,6+ Mos 08/07/2017, 07/27/2018, 07/04/2019  . Influenza,inj,Quad PF,6-35 Mos 08/10/2020  . PFIZER(Purple Top)SARS-COV-2 Vaccination 01/30/2020, 02/21/2020  . Pneumococcal Conjugate-13 07/27/2018  . Pneumococcal Polysaccharide-23 03/22/2014, 08/07/2017  . Tdap 08/07/2017    Past Medical History:  Diagnosis Date  . Abdominal pain   . Anemia   . Anxiety   . Bronchitis   . COPD (chronic obstructive pulmonary disease) ([place])   . Depression    bipolar  . Diarrhea   . Emphysema   . GERD (gastroesophageal reflux disease)   . History of blood transfusion   . MDS (myelodysplastic syndrome) ([place])   . Splenomegaly    r/t low platelets  . Thrombocytopenia ([place])   . Tobacco abuse     Tobacco History: Social History   Tobacco Use  Smoking Status Former Smoker  . Packs/day: 1.00  . Years: 30.00  . Pack years: 30.00  . Types: Cigarettes  . Quit date: 01/31/2021  . Years since quitting: 0.0  Smokeless Tobacco Never Used  Tobacco Comment   quit mid april 2022   Counseling given: Not Answered Comment: quit mid april 2022   Outpatient Medications Prior to Visit  Medication Sig Dispense Refill  . Accu-Chek Softclix Lancets lancets USE AS DIRECTED USE TWO TIMES A DAY 100 each 12  . acetaminophen (TYLENOL) 500 MG tablet Take 1,000 mg by mouth daily as needed for moderate pain.    [name] albuterol (PROVENTIL) (2.5 MG/3ML) 0.083% nebulizer solution Take 3 mLs (2.5 mg total) by nebulization every 4 (four) hours as needed for wheezing or shortness of breath. 75 mL 5  . busPIRone (BUSPAR) 5 MG tablet TAKE 1 TABLET(5 MG) BY MOUTH THREE TIMES DAILY 90 tablet 0  . cholecalciferol (VITAMIN D3) 25 MCG (1000 UT) tablet Take 5,000 Units by mouth daily.    . colestipol (COLESTID) 1 g tablet TAKE 3 TABLETS BY  MOUTH EVERY MORNING AND TAKE 2 TABLETS BY MOUTH EVERY EVENING 150 tablet 0  . Copper Gluconate 2 MG CAPS Take 4 mg by mouth daily.    . Cyanocobalamin (VITAMIN B-12 IJ) Inject 1 mg as directed as directed. Every 3 months    . diclofenac sodium (VOLTAREN) 1 % GEL Apply 2 g topically 4 (four) times daily. 2 g 3  . dicyclomine (BENTYL) 20 MG tablet TAKE 1 TABLET BY MOUTH THREE TIMES DAILY 120 tablet 1  . eltrombopag (PROMACTA) 50 MG tablet Take by mouth.    . esomeprazole (NEXIUM) 40 MG capsule TAKE 1 CAPSULE(40 MG) BY MOUTH TWICE DAILY BEFORE A MEAL 60 capsule 3  . famotidine (PEPCID) 40 MG tablet TAKE 1 TABLET(40 MG) BY MOUTH TWICE DAILY 60 tablet 0  . folic acid (FOLVITE) 1 MG tablet TAKE 1 TABLET(1 MG) BY MOUTH DAILY 90 tablet 2  . lipase/protease/amylase (CREON) 36000 UNITS CPEP capsule Take 2 capsules (72,000 Units total) by mouth 3 (three) times daily with meals. May  also take 1 capsule (36,000 Units total) as needed (with snacks). 240 capsule 11  . nicotine (NICODERM CQ) 21 mg/24hr patch Place 1 patch (21 mg total) onto the skin daily. 28 patch 3  . nicotine polacrilex (COMMIT) 4 MG lozenge Take 1 lozenge (4 mg total) by mouth as needed for smoking cessation. 108 tablet 3  . solifenacin (VESICARE) 10 MG tablet TAKE 1 TABLET(10 MG) BY MOUTH DAILY 30 tablet 5  . traZODone (DESYREL) 150 MG tablet Take 1 tablet (150 mg total) by mouth at bedtime. For sleep (Patient taking differently: Take 300 mg by mouth at bedtime. For sleep) 30 tablet 0  . Venlafaxine HCl 225 MG TB24 Take 1 tablet by mouth daily.    [name] venlafaxine XR (EFFEXOR-XR) 75 MG 24 hr capsule Take 1 capsule (75 mg total) by mouth daily with breakfast. For depression 30 capsule 0  . albuterol (VENTOLIN HFA) 108 (90 Base) MCG/ACT inhaler Inhale into the lungs every 6 (six) hours as needed for wheezing or shortness of breath.    . fluticasone-salmeterol (ADVAIR HFA) 230-21 MCG/ACT inhaler Inhale 2 puffs into the lungs 2 (two) times daily. 12  g 12  . SPIRIVA RESPIMAT 2.5 MCG/ACT AERS INHALE 2 PUFFS INTO THE LUNGS DAILY 4 g 1  . mirabegron ER (MYRBETRIQ) 25 MG TB24 tablet Take 1 tablet (25 mg total) by mouth daily. (Patient not taking: Reported on 02/09/2021) 30 tablet 5  . predniSONE (DELTASONE) 10 MG tablet Take 2 tablets (20mg  total) daily for the next 5 days. Take in the AM with food. 10 tablet 0   No facility-administered medications prior to visit.   Review of Systems  Review of Systems  Constitutional: Negative.   Respiratory: Negative for cough, shortness of breath and wheezing.   Cardiovascular: Negative.    Physical Exam  BP 118/72 (BP Location: Right Arm, Cuff Size: Normal)   Pulse 94   Temp 97.6 F (36.4 C) (Temporal)   Ht 5\' 6"  (1.676 m)   Wt 243 lb 12.8 oz (110.6 kg)   LMP  (LMP Unknown)   SpO2 96% Comment: RA  BMI 39.35 kg/m  Physical Exam Constitutional:      Appearance: Normal appearance.  HENT:     Head: Normocephalic and atraumatic.     Mouth/Throat:     Mouth: Mucous membranes are moist.     Pharynx: Oropharynx is clear.  Cardiovascular:     Rate and Rhythm: Normal rate and regular rhythm.     Comments: Trace BLE edema Pulmonary:     Effort: Pulmonary effort is normal.     Breath sounds: No wheezing, rhonchi or rales.  Musculoskeletal:        General: Normal range of motion.  Skin:    General: Skin is warm and dry.  Neurological:     General: No focal deficit present.     Mental Status: She is alert and oriented to person, place, and time. Mental status is at baseline.  Psychiatric:        Mood and Affect: Mood normal.        Behavior: Behavior normal.        Thought Content: Thought content normal.        Judgment: Judgment normal.      Lab Results:  CBC    Component Value Date/Time   WBC 4.8 05/20/2020 0803   RBC 4.52 05/20/2020 0803   HGB 13.7 05/20/2020 0803   HGB 11.7 (L) 09/21/2018 1000   HGB 10.3 (  L) 08/01/2018 1154   HCT 41.4 05/20/2020 0803   HCT 32.9 (L)  08/01/2018 1154   PLT 37 (L) 05/20/2020 0803   PLT 15 (L) 09/21/2018 1000   PLT 37 (LL) 08/01/2018 1154   MCV 91.6 05/20/2020 0803   MCV 83 08/01/2018 1154   MCH 30.3 05/20/2020 0803   MCHC 33.1 05/20/2020 0803   RDW 15.8 (H) 05/20/2020 0803   RDW 15.4 08/01/2018 1154   LYMPHSABS 0.6 (L) 05/20/2020 0803   LYMPHSABS 0.9 08/01/2018 1154   MONOABS 0.4 05/20/2020 0803   EOSABS 0.1 05/20/2020 0803   EOSABS 0.0 08/01/2018 1154   BASOSABS 0.1 05/20/2020 0803   BASOSABS 0.0 08/01/2018 1154    BMET    Component Value Date/Time   NA 141 05/20/2020 0803   NA 140 07/04/2019 1110   K 4.3 05/20/2020 0803   CL 106 05/20/2020 0803   CO2 28 05/20/2020 0803   GLUCOSE 76 05/20/2020 0803   BUN 11 05/20/2020 0803   BUN 13 07/04/2019 1110   CREATININE 0.76 05/20/2020 0803   CREATININE 0.78 03/05/2020 1039   CREATININE 0.59 08/07/2017 1410   CALCIUM 9.0 05/20/2020 0803   GFRNONAA >60 05/20/2020 0803   GFRNONAA >60 03/05/2020 1039   GFRNONAA 110 08/07/2017 1410   GFRAA >60 05/20/2020 0803   GFRAA >60 03/05/2020 1039   GFRAA 127 08/07/2017 1410    BNP No results found for: BNP  ProBNP    Component Value Date/Time   PROBNP 22.0 09/30/2013 1234    Imaging: No results found.   Assessment & Plan:   COPD (chronic obstructive pulmonary disease) (HCC) - Stable interval; No recent exacerbations or hospitalizations. No SABA use.  - Continue Advair HFA 230-67mcg two puffs twice daily  - Continue Spiriva Respimat 2.12mcg two puffs once daily in morning  - FU in 6 months or sooner if needed   Former smoker - Recently quit smoking April 2022, congratulated. Continue to encourage patient abstain from cigarettes  - Referring to lung cancer screening program (she will be turning 50 next month and has 30 pack year hx)   Martyn Ehrich, NP 02/09/2021

## 2021-02-09 ENCOUNTER — Ambulatory Visit: Payer: Medicaid Other | Admitting: Pulmonary Disease

## 2021-02-09 ENCOUNTER — Other Ambulatory Visit: Payer: Self-pay

## 2021-02-09 ENCOUNTER — Encounter: Payer: Self-pay | Admitting: Primary Care

## 2021-02-09 ENCOUNTER — Ambulatory Visit (INDEPENDENT_AMBULATORY_CARE_PROVIDER_SITE_OTHER): Payer: Medicaid Other | Admitting: Primary Care

## 2021-02-09 VITALS — BP 118/72 | HR 94 | Temp 97.6°F | Ht 66.0 in | Wt 243.8 lb

## 2021-02-09 DIAGNOSIS — J449 Chronic obstructive pulmonary disease, unspecified: Secondary | ICD-10-CM | POA: Diagnosis not present

## 2021-02-09 DIAGNOSIS — Z87891 Personal history of nicotine dependence: Secondary | ICD-10-CM | POA: Diagnosis not present

## 2021-02-09 MED ORDER — ADVAIR HFA 230-21 MCG/ACT IN AERO: 2.0000 | INHALATION_SPRAY | Freq: Two times a day (BID) | RESPIRATORY_TRACT | 11 refills | Status: DC

## 2021-02-09 MED ORDER — ALBUTEROL SULFATE HFA 108 (90 BASE) MCG/ACT IN AERS: 1.0000 | INHALATION_SPRAY | Freq: Four times a day (QID) | RESPIRATORY_TRACT | 2 refills | Status: DC | PRN

## 2021-02-09 MED ORDER — SPIRIVA RESPIMAT 2.5 MCG/ACT IN AERS: 2.0000 | INHALATION_SPRAY | Freq: Every day | RESPIRATORY_TRACT | 11 refills | Status: DC

## 2021-02-09 NOTE — Assessment & Plan Note (Addendum)
-   Stable interval; No recent exacerbations or hospitalizations. No SABA use.  - Continue Advair HFA 230-58mcg two puffs twice daily  - Continue Spiriva Respimat 2.15mcg two puffs once daily in morning  - FU in 6 months or sooner if needed

## 2021-02-09 NOTE — Assessment & Plan Note (Signed)
-   Recently quit smoking April 2022, congratulated. Continue to encourage patient abstain from cigarettes  - Referring to lung cancer screening program (she will be turning 50 next month and has 30 pack year hx)

## 2021-02-09 NOTE — Patient Instructions (Addendum)
Nice seeing you today Barbara Stanley CONGRATULATIONS ON QUITTING SMOKING!!!  Recommendations: - Continue Advair two puffs morning and evening - Continue Spiriva respimat two puffs once daily in morning - Use albuterol rescue inhaler/nebulzier every 6 hours as needed for breakthrough shortness of breath/wheezing - Continue to abstain from cigarettes   Refer: - Lung cancer screening program   Follow-up: - 6 months with Dr. Valeta Harms or APP

## 2021-02-11 NOTE — Progress Notes (Signed)
Thanks I agree with LDCT program  Garner Nash, DO [facility] 02/11/2021 4:41 PM

## 2021-02-15 ENCOUNTER — Other Ambulatory Visit: Payer: Self-pay | Admitting: Gastroenterology

## 2021-02-17 ENCOUNTER — Other Ambulatory Visit: Payer: Self-pay | Admitting: Gastroenterology

## 2021-02-24 ENCOUNTER — Ambulatory Visit (INDEPENDENT_AMBULATORY_CARE_PROVIDER_SITE_OTHER): Payer: Medicaid Other | Admitting: Gastroenterology

## 2021-02-24 ENCOUNTER — Encounter: Payer: Self-pay | Admitting: Gastroenterology

## 2021-02-24 VITALS — BP 130/80 | HR 103 | Ht 66.0 in | Wt 240.0 lb

## 2021-02-24 DIAGNOSIS — R197 Diarrhea, unspecified: Secondary | ICD-10-CM | POA: Diagnosis not present

## 2021-02-24 DIAGNOSIS — K8689 Other specified diseases of pancreas: Secondary | ICD-10-CM

## 2021-02-24 DIAGNOSIS — K219 Gastro-esophageal reflux disease without esophagitis: Secondary | ICD-10-CM

## 2021-02-24 MED ORDER — FAMOTIDINE 20 MG PO TABS
20.0000 mg | ORAL_TABLET | Freq: Two times a day (BID) | ORAL | 3 refills | Status: DC
Start: 2021-02-24 — End: 2022-07-27

## 2021-02-24 MED ORDER — DICYCLOMINE HCL 20 MG PO TABS: 20.0000 mg | ORAL_TABLET | Freq: Four times a day (QID) | ORAL | 3 refills | Status: AC

## 2021-02-24 MED ORDER — ESOMEPRAZOLE MAGNESIUM 40 MG PO CPDR: 40.0000 mg | DELAYED_RELEASE_CAPSULE | Freq: Two times a day (BID) | ORAL | 3 refills | Status: DC

## 2021-02-24 MED ORDER — COLESTIPOL HCL 1 G PO TABS: 2.0000 g | ORAL_TABLET | Freq: Two times a day (BID) | ORAL | 3 refills | Status: DC

## 2021-02-24 NOTE — Patient Instructions (Addendum)
It was a pleasure to see you today. Based on our discussion, I am providing you with my recommendations below:  RECOMMENDATION(S):   Please try reduce your smoking habits. Please refer to the tips we have included below.  Continue Benefiber and Probiotics daily  PRESCRIPTION MEDICATION(S):   We have sent the following medication(s) to your pharmacy:  . Nexium, Famotidine, Dicyclomine and Colestipol  NOTE: If your medication(s) requires a PRIOR AUTHORIZATION, we will receive notification from your pharmacy. Once received, the process to submit for approval may take up to 7-10 business days. You will be contacted about any denials we have received from your insurance company as well as alternatives recommended by your provider.  FOLLOW UP:  . I would like for you to follow up with me in 6-8 months. Please call the office at [phone] to schedule your appointment.  BMI:  . If you are age 36 or younger, your body mass index should be between 19-25. Your There is no height or weight on file to calculate BMI. If this is out of the aformentioned range listed, please consider follow up with your Primary Care Provider.   Thank you for trusting me with your gastrointestinal care!    Thornton Park, MD, MPH  Steps to Quit Smoking Smoking tobacco is the leading cause of preventable death. It can affect almost every organ in the body. Smoking puts you and people around you at risk for many serious, long-lasting (chronic) diseases. Quitting smoking can be hard, but it is one of the best things that you can do for your health. It is never too late to quit. How do I get ready to quit? When you decide to quit smoking, make a plan to help you succeed. Before you quit:  Pick a date to quit. Set a date within the next 2 weeks to give you time to prepare.  Write down the reasons why you are quitting. Keep this list in places where you will see it often.  Tell your family, friends, and  co-workers that you are quitting. Their support is important.  Talk with your doctor about the choices that may help you quit.  Find out if your health insurance will pay for these treatments.  Know the people, places, things, and activities that make you want to smoke (triggers). Avoid them. What first steps can I take to quit smoking?  Throw away all cigarettes at home, at work, and in your car.  Throw away the things that you use when you smoke, such as ashtrays and lighters.  Clean your car. Make sure to empty the ashtray.  Clean your home, including curtains and carpets. What can I do to help me quit smoking? Talk with your doctor about taking medicines and seeing a counselor at the same time. You are more likely to succeed when you do both.  If you are pregnant or breastfeeding, talk with your doctor about counseling or other ways to quit smoking. Do not take medicine to help you quit smoking unless your doctor tells you to do so. To quit smoking: Quit right away  Quit smoking totally, instead of slowly cutting back on how much you smoke over a period of time.  Go to counseling. You are more likely to quit if you go to counseling sessions regularly. Take medicine You may take medicines to help you quit. Some medicines need a prescription, and some you can buy over-the-counter. Some medicines may contain a drug called nicotine to replace  the nicotine in cigarettes. Medicines may:  Help you to stop having the desire to smoke (cravings).  Help to stop the problems that come when you stop smoking (withdrawal symptoms). Your doctor may ask you to use:  Nicotine patches, gum, or lozenges.  Nicotine inhalers or sprays.  Non-nicotine medicine that is taken by mouth. Find resources Find resources and other ways to help you quit smoking and remain smoke-free after you quit. These resources are most helpful when you use them often. They include:  Online chats with a  Social worker.  Phone quitlines.  Printed Furniture conservator/restorer.  Support groups or group counseling.  Text messaging programs.  Mobile phone apps. Use apps on your mobile phone or tablet that can help you stick to your quit plan. There are many free apps for mobile phones and tablets as well as websites. Examples include Quit Guide from the [employer] and smokefree.gov   What things can I do to make it easier to quit?  Talk to your family and friends. Ask them to support and encourage you.  Call a phone quitline (1-800-QUIT-NOW), reach out to support groups, or work with a Social worker.  Ask people who smoke to not smoke around you.  Avoid places that make you want to smoke, such as: ? Bars. ? Parties. ? Smoke-break areas at work.  Spend time with people who do not smoke.  Lower the stress in your life. Stress can make you want to smoke. Try these things to help your stress: ? Getting regular exercise. ? Doing deep-breathing exercises. ? Doing yoga. ? Meditating. ? Doing a body scan. To do this, close your eyes, focus on one area of your body at a time from head to toe. Notice which parts of your body are tense. Try to relax the muscles in those areas.   How will I feel when I quit smoking? Day 1 to 3 weeks Within the first 24 hours, you may start to have some problems that come from quitting tobacco. These problems are very bad 2-3 days after you quit, but they do not often last for more than 2-3 weeks. You may get these symptoms:  Mood swings.  Feeling restless, nervous, angry, or annoyed.  Trouble concentrating.  Dizziness.  Strong desire for high-sugar foods and nicotine.  Weight gain.  Trouble pooping (constipation).  Feeling like you may vomit (nausea).  Coughing or a sore throat.  Changes in how the medicines that you take for other issues work in your body.  Depression.  Trouble sleeping (insomnia). Week 3 and afterward After the first 2-3 weeks of quitting, you  may start to notice more positive results, such as:  Better sense of smell and taste.  Less coughing and sore throat.  Slower heart rate.  Lower blood pressure.  Clearer skin.  Better breathing.  Fewer sick days. Quitting smoking can be hard. Do not give up if you fail the first time. Some people need to try a few times before they succeed. Do your best to stick to your quit plan, and talk with your doctor if you have any questions or concerns. Summary  Smoking tobacco is the leading cause of preventable death. Quitting smoking can be hard, but it is one of the best things that you can do for your health.  When you decide to quit smoking, make a plan to help you succeed.  Quit smoking right away, not slowly over a period of time.  When you start quitting, seek help from  your doctor, family, or friends. This information is not intended to replace advice given to you by your health care provider. Make sure you discuss any questions you have with your health care provider. Document Revised: 06/28/2019 Document Reviewed: 12/22/2018 Elsevier Patient Education  [place].

## 2021-02-24 NOTE — Progress Notes (Signed)
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 Symptoms are controlled to her satisfaction off Creon using colestipol 2g BID with daily probiotics.  She is not ready to quit smoking.   Reflux: Controlled on Nexium 40 mg BID and famotidine 20 mg BID, although she prefers to dose them on a PRN basis.  Colon cancer screening: She will be due surveillance colonoscopy in 2030.     PLAN: - Continue Nexium 40 mg BID and famotodine 20 mg BID (Refill today) - Continue daily Benefiber and probiotics - Continue dicyclomine 20 mg QID  - Adjust colestipol to 2g BID  - Smoking cessation encouraged - Follow-up in 6-8 months, earlier if needed   HPI: Barbara Stanley is a 50 y.o. female  who returns in scheduled follow-up. She was last seen 07/01/20. The interval history is obtained through the patient and review of her electronic health record. Gastric bypass performed 16 years ago [place], now with  PEI presenting with acute on chronic diarrhea. Associated copper and B12 deficiencies have resolved. At it's worst, she was having 20 bowel movements daily. Baseline bowel habits following the bypass were diarrhea - but with 4 slightly formed BM daily. Was also having diffuse and upper abdominal abdominal pain that occurs immediately after eating or while eating despite Nexium QAM and famotidine QPM. Associated nausea. Occassional postprandial bowel movement that improves the pain.   Evaluation has included: - Upper GI series 07/12/2019: moderate gastroesophageal reflux, small hiatal hernia and inability to completely assess for esophageal motility given the patient's nausea during the exam.  No significant delay in contrast passage through the esophagus. - EGD 09/26/2019: normal esophagus, small hiatal hernia, evidence of gastric bypass with a small pouch that appeared normal and a normal appearing anastomosis. Duodenal biopsies were normal.   - Colonoscopy 09/26/2019: Normal.  Random colon biopsies from the right and left colon for microscopic colitis were normal.  Treatment trials:  - Success with colestipol 2 mg BID with two, soft formed stools daily - Bentyl PRN abdominal pain with intermittent success - Probiotics, could not afford Align - Activa yogurt - Empiric metronidazole for possible SIBO - Daily benefiber - Creon initially provided improvement in pain, did not change diarrhea  Returns in scheduled follow-up today. She stopped taking Creon because she hasn't been having significant abdominal pain. She has been adjusting her dose of colestipol to 2g BID instead of 3 gQAM and 2g QHS. No change off it. Will use dicyclomine.  Appetite is fluctuating. Weight is stable. No new complaints or concerns.   Prior endoscopy:  EGD for reflux several years ago "[place]where in [place]  EGD 09/26/2019: normal esophagus, small hiatal hernia, evidence of gastric bypass with a small pouch normal-appearing  anastomosis, and normal small bowel.  Duodenal biopsies were normal. Colonoscopy 09/26/2019: normal.  Random colon biopsies for microscopic colitis were normal.   Abdominal imaging: Abdominal ultrasound for abnormal liver enzymes 08/09/18:  common bile duct of 6.6 mm status post cholecystectomy.  Spleen is upper limit of normal at 12.6 cm. Transaminases at that time were elevated. They have since been repeatedly normal on several rechecks.  UGI series 07/12/19: Moderate gastroesophageal reflux. Small hiatal hernia. Esophageal motility could not be completely assessed as the patient became nauseated during the exam and could not perform this portion. However throughout the exam no significant delay in contrast passage through the esophagus was observed. CT abd/pelvis with contrast 09/04/19: Unremarkable pancreas. No ductal dilatation or surrounding inflammatory changes. Splenomegaly. Prior gastric bypass. Multiple ventral well abdominal hernias that contain fat. Thickened appendix with diffuse intramural fatty deposition. No stranding or free fluid.   Past Medical History:  Diagnosis Date  . Abdominal pain   . Anemia   . Anxiety   . Bronchitis   . COPD (chronic obstructive pulmonary disease) ([place])   . Depression    bipolar  . Diarrhea   . Emphysema   . GERD (gastroesophageal reflux disease)   . History of blood transfusion   . MDS (myelodysplastic syndrome) ([place])   . Splenomegaly    r/t low platelets  . Thrombocytopenia ([place])   . Tobacco abuse     Past Surgical History:  Procedure Laterality Date  . ABDOMINAL HYSTERECTOMY    . c section x 3     . CHOLECYSTECTOMY    . GASTRIC BYPASS    . UPPER GASTROINTESTINAL ENDOSCOPY    . WISDOM TOOTH EXTRACTION      Current Outpatient Medications  Medication Sig Dispense Refill  . Accu-Chek Softclix Lancets lancets USE AS DIRECTED USE TWO TIMES A DAY 100 each 12  . acetaminophen (TYLENOL) 500 MG tablet Take 1,000 mg by mouth daily as needed  for moderate pain.    [name] albuterol (PROVENTIL) (2.5 MG/3ML) 0.083% nebulizer solution Take 3 mLs (2.5 mg total) by nebulization every 4 (four) hours as needed for wheezing or shortness of breath. 75 mL 5  . albuterol (VENTOLIN HFA) 108 (90 Base) MCG/ACT inhaler Inhale 1-2 puffs into the lungs every 6 (six) hours as needed for wheezing or shortness of breath. 18 g 2  . busPIRone (BUSPAR) 5 MG tablet TAKE 1 TABLET(5 MG) BY MOUTH THREE TIMES DAILY 90 tablet 0  . cholecalciferol (VITAMIN D3) 25 MCG (1000 UT) tablet Take 5,000 Units by mouth daily.    . colestipol (COLESTID) 1 g tablet TAKE 3 TABLETS BY MOUTH EVERY MORNING AND TAKE 2 TABLETS BY MOUTH EVERY EVENING 150 tablet 0  . Copper Gluconate 2 MG CAPS Take 4 mg by mouth daily.    . Cyanocobalamin (VITAMIN B-12 IJ) Inject 1 mg as directed as directed. Every 3 months    . diclofenac sodium (VOLTAREN) 1 % GEL Apply 2 g topically 4 (four) times daily. 2 g 3  . dicyclomine (BENTYL) 20 MG tablet TAKE 1 TABLET BY MOUTH THREE TIMES DAILY 120 tablet 1  . eltrombopag (PROMACTA) 50 MG tablet Take by mouth.    . esomeprazole (NEXIUM) 40 MG capsule TAKE 1 CAPSULE(40  MG) BY MOUTH TWICE DAILY BEFORE A MEAL 60 capsule 3  . famotidine (PEPCID) 40 MG tablet TAKE 1 TABLET(40 MG) BY MOUTH TWICE DAILY 60 tablet 0  . fluticasone-salmeterol (ADVAIR HFA) 230-21 MCG/ACT inhaler Inhale 2 puffs into the lungs 2 (two) times daily. 12 g 11  . folic acid (FOLVITE) 1 MG tablet TAKE 1 TABLET(1 MG) BY MOUTH DAILY 90 tablet 2  . nicotine (NICODERM CQ) 21 mg/24hr patch Place 1 patch (21 mg total) onto the skin daily. 28 patch 3  . nicotine polacrilex (COMMIT) 4 MG lozenge Take 1 lozenge (4 mg total) by mouth as needed for smoking cessation. 108 tablet 3  . solifenacin (VESICARE) 10 MG tablet TAKE 1 TABLET(10 MG) BY MOUTH DAILY 30 tablet 5  . Tiotropium Bromide Monohydrate (SPIRIVA RESPIMAT) 2.5 MCG/ACT AERS Inhale 2 puffs into the lungs daily. 4 g 11  . traZODone (DESYREL) 150 MG  tablet Take 1 tablet (150 mg total) by mouth at bedtime. For sleep (Patient taking differently: Take 300 mg by mouth at bedtime. For sleep) 30 tablet 0  . venlafaxine XR (EFFEXOR-XR) 75 MG 24 hr capsule Take 1 capsule (75 mg total) by mouth daily with breakfast. For depression 30 capsule 0   No current facility-administered medications for this visit.    Allergies as of 02/24/2021 - Review Complete 02/24/2021  Allergen Reaction Noted  . Other  08/08/2018    Family History  Problem Relation Age of Onset  . Diabetes Mother   . Diabetes Other   . Hypertension Other   . CAD Other   . Heart disease Father   . Colon cancer Neg Hx   . Rectal cancer Neg Hx   . Stomach cancer Neg Hx   . Esophageal cancer Neg Hx       Physical Exam: Vital signs were reviewed. General:   Alert, well-nourished, pleasant and cooperative in NAD. Bright affect.  Abdomen:  Soft, nontender, central obesity, normal bowel sounds. No rebound or guarding. No hepatosplenomegaly. Well healed midline surgical scar.  Neurologic:  Alert and  oriented x4;  grossly nonfocal Skin:  No rash or bruise. Palmar erythema present. No spider angioma.  Psych:  Alert and cooperative. Normal mood and affect.   Enis Leatherwood L. Tarri Glenn Md, MPH [facility] 02/24/2021, 9:09 AM

## 2021-02-25 ENCOUNTER — Other Ambulatory Visit: Payer: Self-pay | Admitting: Obstetrics and Gynecology

## 2021-04-01 ENCOUNTER — Ambulatory Visit (INDEPENDENT_AMBULATORY_CARE_PROVIDER_SITE_OTHER): Payer: Medicaid Other | Admitting: Nurse Practitioner

## 2021-04-01 ENCOUNTER — Encounter: Payer: Self-pay | Admitting: Nurse Practitioner

## 2021-04-01 ENCOUNTER — Other Ambulatory Visit: Payer: Self-pay

## 2021-04-01 VITALS — BP 115/63 | HR 91 | Temp 97.1°F | Ht 66.0 in | Wt 241.0 lb

## 2021-04-01 DIAGNOSIS — Z1231 Encounter for screening mammogram for malignant neoplasm of breast: Secondary | ICD-10-CM

## 2021-04-01 DIAGNOSIS — Z23 Encounter for immunization: Secondary | ICD-10-CM | POA: Diagnosis not present

## 2021-04-01 DIAGNOSIS — D4620 Refractory anemia with excess of blasts, unspecified: Secondary | ICD-10-CM | POA: Diagnosis not present

## 2021-04-01 DIAGNOSIS — D46Z Other myelodysplastic syndromes: Secondary | ICD-10-CM

## 2021-04-01 DIAGNOSIS — F332 Major depressive disorder, recurrent severe without psychotic features: Secondary | ICD-10-CM

## 2021-04-01 DIAGNOSIS — D61818 Other pancytopenia: Secondary | ICD-10-CM

## 2021-04-01 DIAGNOSIS — E46 Unspecified protein-calorie malnutrition: Secondary | ICD-10-CM | POA: Diagnosis not present

## 2021-04-01 DIAGNOSIS — J449 Chronic obstructive pulmonary disease, unspecified: Secondary | ICD-10-CM | POA: Diagnosis not present

## 2021-04-01 DIAGNOSIS — F17209 Nicotine dependence, unspecified, with unspecified nicotine-induced disorders: Secondary | ICD-10-CM

## 2021-04-01 NOTE — Patient Instructions (Addendum)
Health Maintenance, Female Adopting a healthy lifestyle and getting preventive care are important in promoting health and wellness. Ask your health care provider about: The right schedule for you to have regular tests and exams. Things you can do on your own to prevent diseases and keep yourself healthy. What should I know about diet, weight, and exercise? Eat a healthy diet  Eat a diet that includes plenty of vegetables, fruits, low-fat dairy products, and lean protein. Do not eat a lot of foods that are high in solid fats, added sugars, or sodium.  Maintain a healthy weight Body mass index (BMI) is used to identify weight problems. It estimates body fat based on height and weight. Your health care provider can help determineyour BMI and help you achieve or maintain a healthy weight. Get regular exercise Get regular exercise. This is one of the most important things you can do for your health. Most adults should: Exercise for at least 150 minutes each week. The exercise should increase your heart rate and make you sweat (moderate-intensity exercise). Do strengthening exercises at least twice a week. This is in addition to the moderate-intensity exercise. Spend less time sitting. Even light physical activity can be beneficial. Watch cholesterol and blood lipids Have your blood tested for lipids and cholesterol at 50 years of age, then havethis test every 5 years. Have your cholesterol levels checked more often if: Your lipid or cholesterol levels are high. You are older than 50 years of age. You are at high risk for heart disease. What should I know about cancer screening? Depending on your health history and family history, you may need to have cancer screening at various ages. This may include screening for: Breast cancer. Cervical cancer. Colorectal cancer. Skin cancer. Lung cancer. What should I know about heart disease, diabetes, and high blood pressure? Blood pressure and heart  disease High blood pressure causes heart disease and increases the risk of stroke. This is more likely to develop in people who have high blood pressure readings, are of African descent, or are overweight. Have your blood pressure checked: Every 3-5 years if you are 18-39 years of age. Every year if you are 40 years old or older. Diabetes Have regular diabetes screenings. This checks your fasting blood sugar level. Have the screening done: Once every three years after age 40 if you are at a normal weight and have a low risk for diabetes. More often and at a younger age if you are overweight or have a high risk for diabetes. What should I know about preventing infection? Hepatitis B If you have a higher risk for hepatitis B, you should be screened for this virus. Talk with your health care provider to find out if you are at risk forhepatitis B infection. Hepatitis C Testing is recommended for: Everyone born from 1945 through 1965. Anyone with known risk factors for hepatitis C. Sexually transmitted infections (STIs) Get screened for STIs, including gonorrhea and chlamydia, if: You are sexually active and are younger than 50 years of age. You are older than 50 years of age and your health care provider tells you that you are at risk for this type of infection. Your sexual activity has changed since you were last screened, and you are at increased risk for chlamydia or gonorrhea. Ask your health care provider if you are at risk. Ask your health care provider about whether you are at high risk for HIV. Your health care provider may recommend a prescription medicine to help   prevent HIV infection. If you choose to take medicine to prevent HIV, you should first get tested for HIV. You should then be tested every 3 months for as long as you are taking the medicine. Pregnancy If you are about to stop having your period (premenopausal) and you may become pregnant, seek counseling before you get  pregnant. Take 400 to 800 micrograms (mcg) of folic acid every day if you become pregnant. Ask for birth control (contraception) if you want to prevent pregnancy. Osteoporosis and menopause Osteoporosis is a disease in which the bones lose minerals and strength with aging. This can result in bone fractures. If you are 70 years old or older, or if you are at risk for osteoporosis and fractures, ask your health care provider if you should: Be screened for bone loss. Take a calcium or vitamin D supplement to lower your risk of fractures. Be given hormone replacement therapy (HRT) to treat symptoms of menopause. Follow these instructions at home: Lifestyle Do not use any products that contain nicotine or tobacco, such as cigarettes, e-cigarettes, and chewing tobacco. If you need help quitting, ask your health care provider. Do not use street drugs. Do not share needles. Ask your health care provider for help if you need support or information about quitting drugs. Alcohol use Do not drink alcohol if: Your health care provider tells you not to drink. You are pregnant, may be pregnant, or are planning to become pregnant. If you drink alcohol: Limit how much you use to 0-1 drink a day. Limit intake if you are breastfeeding. Be aware of how much alcohol is in your drink. In the [place] one drink equals one 12 oz bottle of beer (355 mL), one 5 oz glass of wine (148 mL), or one 1 oz glass of hard liquor (44 mL). General instructions Schedule regular health, dental, and eye exams. Stay current with your vaccines. Tell your health care provider if: You often feel depressed. You have ever been abused or do not feel safe at home. Summary Adopting a healthy lifestyle and getting preventive care are important in promoting health and wellness. Follow your health care provider's instructions about healthy diet, exercising, and getting tested or screened for diseases. Follow your health care provider's  instructions on monitoring your cholesterol and blood pressure. This information is not intended to replace advice given to you by your health care provider. Make sure you discuss any questions you have with your healthcare provider. Document Revised: 09/26/2018 Document Reviewed: 09/26/2018 Elsevier Patient Education  2022 [place].  Steps to Quit Smoking Smoking tobacco is the leading cause of preventable death. It can affect almost every organ in the body. Smoking puts you and people around you at risk for many serious, long-lasting (chronic) diseases. Quitting smoking can be hard, but it is one of the best things thatyou can do for your health. It is never too late to quit. How do I get ready to quit? When you decide to quit smoking, make a plan to help you succeed. Before you quit: Pick a date to quit. Set a date within the next 2 weeks to give you time to prepare. Write down the reasons why you are quitting. Keep this list in places where you will see it often. Tell your family, friends, and co-workers that you are quitting. Their support is important. Talk with your doctor about the choices that may help you quit. Find out if your health insurance will pay for these treatments. Know the people,  places, things, and activities that make you want to smoke (triggers). Avoid them. What first steps can I take to quit smoking? Throw away all cigarettes at home, at work, and in your car. Throw away the things that you use when you smoke, such as ashtrays and lighters. Clean your car. Make sure to empty the ashtray. Clean your home, including curtains and carpets. What can I do to help me quit smoking? Talk with your doctor about taking medicines and seeing a counselor at the same time. You are more likely to succeed when you do both. If you are pregnant or breastfeeding, talk with your doctor about counseling or other ways to quit smoking. Do not take medicine to help you quit smoking  unless your doctor tells you to do so. To quit smoking: Quit right away Quit smoking totally, instead of slowly cutting back on how much you smoke over a period of time. Go to counseling. You are more likely to quit if you go to counseling sessions regularly. Take medicine You may take medicines to help you quit. Some medicines need a prescription, and some you can buy over-the-counter. Some medicines may contain a drug called nicotine to replace the nicotine in cigarettes. Medicines may: Help you to stop having the desire to smoke (cravings). Help to stop the problems that come when you stop smoking (withdrawal symptoms). Your doctor may ask you to use: Nicotine patches, gum, or lozenges. Nicotine inhalers or sprays. Non-nicotine medicine that is taken by mouth. Find resources Find resources and other ways to help you quit smoking and remain smoke-free after you quit. These resources are most helpful when you use them often. They include: Online chats with a Social worker. Phone quitlines. Printed Furniture conservator/restorer. Support groups or group counseling. Text messaging programs. Mobile phone apps. Use apps on your mobile phone or tablet that can help you stick to your quit plan. There are many free apps for mobile phones and tablets as well as websites. Examples include Quit Guide from the [employer] and smokefree.gov  What things can I do to make it easier to quit?  Talk to your family and friends. Ask them to support and encourage you. Call a phone quitline (1-800-QUIT-NOW), reach out to support groups, or work with a Social worker. Ask people who smoke to not smoke around you. Avoid places that make you want to smoke, such as: Bars. Parties. Smoke-break areas at work. Spend time with people who do not smoke. Lower the stress in your life. Stress can make you want to smoke. Try these things to help your stress: Getting regular exercise. Doing deep-breathing exercises. Doing  yoga. Meditating. Doing a body scan. To do this, close your eyes, focus on one area of your body at a time from head to toe. Notice which parts of your body are tense. Try to relax the muscles in those areas. How will I feel when I quit smoking? Day 1 to 3 weeks Within the first 24 hours, you may start to have some problems that come from quitting tobacco. These problems are very bad 2-3 days after you quit, but they do not often last for more than 2-3 weeks. You may get these symptoms: Mood swings. Feeling restless, nervous, angry, or annoyed. Trouble concentrating. Dizziness. Strong desire for high-sugar foods and nicotine. Weight gain. Trouble pooping (constipation). Feeling like you may vomit (nausea). Coughing or a sore throat. Changes in how the medicines that you take for other issues work in your body. Depression. Trouble  sleeping (insomnia). Week 3 and afterward After the first 2-3 weeks of quitting, you may start to notice more positive results, such as: Better sense of smell and taste. Less coughing and sore throat. Slower heart rate. Lower blood pressure. Clearer skin. Better breathing. Fewer sick days. Quitting smoking can be hard. Do not give up if you fail the first time. Some people need to try a few times before they succeed. Do your best to stick to your quit plan, and talk with yourdoctor if you have any questions or concerns. Summary Smoking tobacco is the leading cause of preventable death. Quitting smoking can be hard, but it is one of the best things that you can do for your health. When you decide to quit smoking, make a plan to help you succeed. Quit smoking right away, not slowly over a period of time. When you start quitting, seek help from your doctor, family, or friends. This information is not intended to replace advice given to you by your health care provider. Make sure you discuss any questions you have with your healthcare provider. Document  Revised: 06/28/2019 Document Reviewed: 12/22/2018 Elsevier Patient Education  [place].

## 2021-04-01 NOTE — Progress Notes (Signed)
[place], [place]  [address] Phone:  [phone]   Fax:  [phone]   Established Patient Office Visit  Subjective:  Patient ID: Barbara Stanley, female    DOB: [birth year]  Age: 50 y.o. MRN: [id number]  CC:  Chief Complaint  Patient presents with   Follow-up    6 month follow up    HPI Barbara Stanley presents for follow up. She  has a past medical history of Abdominal pain, Anemia, Anxiety, Bronchitis, COPD (chronic obstructive pulmonary disease) ([place]), Depression, Diarrhea, Emphysema, GERD (gastroesophageal reflux disease), History of blood transfusion, MDS (myelodysplastic syndrome) ([place]), Splenomegaly, Thrombocytopenia ([place]), and Tobacco abuse.   She is being followed closely by hematology at [facility] and completed lab 3 weeks ago which are stable.   She has a history of hypoglycemia and is not expericing any problems today.  She feels like her chronic conditions are about the same.   COPD Patient complains of chronic dyspnea. Symptoms began several  years  ago. Patient currently is not on home oxygen therapy.[name] Respiratory history: chronic bronchitis, COPD, and emphysema.  Past Medical History:  Diagnosis Date   Abdominal pain    Anemia    Anxiety    Bronchitis    COPD (chronic obstructive pulmonary disease) ([facility])    Depression    bipolar   Diarrhea    Emphysema    GERD (gastroesophageal reflux disease)    History of blood transfusion    MDS (myelodysplastic syndrome) ([facility])    Splenomegaly    r/t low platelets   Thrombocytopenia ([facility])    Tobacco abuse     Past Surgical History:  Procedure Laterality Date   ABDOMINAL HYSTERECTOMY     c section x 3      CHOLECYSTECTOMY     GASTRIC BYPASS     UPPER GASTROINTESTINAL ENDOSCOPY     WISDOM TOOTH EXTRACTION      Family History  Problem Relation Age of Onset   Diabetes Mother    Diabetes Other    Hypertension Other    CAD Other    Heart disease Father    Colon  cancer Neg Hx    Rectal cancer Neg Hx    Stomach cancer Neg Hx    Esophageal cancer Neg Hx     Social History   Socioeconomic History   Marital status: Married    Spouse name: Not on file   Number of children: 3   Years of education: Not on file   Highest education level: Not on file  Occupational History   Occupation: disabled  Tobacco Use   Smoking status: Every Day    Packs/day: 0.50    Years: 30.00    Pack years: 15.00    Types: Cigarettes    Last attempt to quit: 01/31/2021    Years since quitting: 0.1   Smokeless tobacco: Never   Tobacco comments:    quit mid april 2022  Vaping Use   Vaping Use: Never used  Substance and Sexual Activity   Alcohol use: Yes    Comment: occasional wine   Drug use: No   Sexual activity: Yes    Birth control/protection: None    Comment: Hysterectomy  Other Topics Concern   Not on file  Social History Narrative   Not on file   Social Determinants of Health   Financial Resource Strain: Not on file  Food Insecurity: Not on file  Transportation Needs: Not on file  Physical Activity: Not on file  Stress: Not on file  Social Connections: Not on file  Intimate Partner Violence: Not on file    Outpatient Medications Prior to Visit  Medication Sig Dispense Refill   Accu-Chek Softclix Lancets lancets USE AS DIRECTED USE TWO TIMES A DAY 100 each 12   acetaminophen (TYLENOL) 500 MG tablet Take 1,000 mg by mouth daily as needed for moderate pain.     albuterol (PROVENTIL) (2.5 MG/3ML) 0.083% nebulizer solution Take 3 mLs (2.5 mg total) by nebulization every 4 (four) hours as needed for wheezing or shortness of breath. 75 mL 5   albuterol (VENTOLIN HFA) 108 (90 Base) MCG/ACT inhaler Inhale 1-2 puffs into the lungs every 6 (six) hours as needed for wheezing or shortness of breath. 18 g 2   busPIRone (BUSPAR) 5 MG tablet TAKE 1 TABLET(5 MG) BY MOUTH THREE TIMES DAILY 90 tablet 0   cholecalciferol (VITAMIN D3) 25 MCG (1000 UT) tablet Take  5,000 Units by mouth daily.     colestipol (COLESTID) 1 g tablet Take 2 tablets (2 g total) by mouth 2 (two) times daily. 120 tablet 3   Copper Gluconate 2 MG CAPS Take 4 mg by mouth daily.     diclofenac sodium (VOLTAREN) 1 % GEL Apply 2 g topically 4 (four) times daily. 2 g 3   dicyclomine (BENTYL) 20 MG tablet Take 1 tablet (20 mg total) by mouth in the morning, at noon, in the evening, and at bedtime. 120 tablet 3   eltrombopag (PROMACTA) 50 MG tablet Take by mouth.     esomeprazole (NEXIUM) 40 MG capsule Take 1 capsule (40 mg total) by mouth 2 (two) times daily. 180 capsule 3   famotidine (PEPCID) 20 MG tablet Take 1 tablet (20 mg total) by mouth 2 (two) times daily. 180 tablet 3   fluticasone-salmeterol (ADVAIR HFA) 230-21 MCG/ACT inhaler Inhale 2 puffs into the lungs 2 (two) times daily. 12 g 11   folic acid (FOLVITE) 1 MG tablet TAKE 1 TABLET(1 MG) BY MOUTH DAILY 90 tablet 2   nicotine polacrilex (COMMIT) 4 MG lozenge Take 1 lozenge (4 mg total) by mouth as needed for smoking cessation. 108 tablet 3   solifenacin (VESICARE) 10 MG tablet TAKE 1 TABLET(10 MG) BY MOUTH DAILY 30 tablet 5   Tiotropium Bromide Monohydrate (SPIRIVA RESPIMAT) 2.5 MCG/ACT AERS Inhale 2 puffs into the lungs daily. 4 g 11   traZODone (DESYREL) 150 MG tablet Take 1 tablet (150 mg total) by mouth at bedtime. For sleep (Patient taking differently: Take 400 mg by mouth at bedtime. For sleep) 30 tablet 0   venlafaxine XR (EFFEXOR-XR) 75 MG 24 hr capsule Take 1 capsule (75 mg total) by mouth daily with breakfast. For depression 30 capsule 0   Cyanocobalamin (VITAMIN B-12 IJ) Inject 1 mg as directed as directed. Every 3 months (Patient not taking: Reported on 04/01/2021)     nicotine (NICODERM CQ) 21 mg/24hr patch Place 1 patch (21 mg total) onto the skin daily. (Patient not taking: Reported on 04/01/2021) 28 patch 3   No facility-administered medications prior to visit.    Allergies  Allergen Reactions   Other      "Banana Bag" given to patient at physician's office, severe back pain, SOB    ROS Review of Systems  Eyes:        Exam last year up coming apt   Respiratory:  Positive for shortness of breath (no worse).   Cardiovascular:  Negative for  chest pain.  Gastrointestinal:  Positive for diarrhea (medication is stable with dietary restriction) and nausea (occasional appetite snacks and one balanced meal per day).  Neurological:  Positive for dizziness.  Hematological:  Bruises/bleeds easily.     Objective:    Physical Exam Constitutional:      Appearance: She is obese.  HENT:     Head: Normocephalic and atraumatic.     Nose: Nose normal.     Mouth/Throat:     Mouth: Mucous membranes are moist.  Cardiovascular:     Rate and Rhythm: Normal rate and regular rhythm.     Pulses: Normal pulses.     Heart sounds: Normal heart sounds.  Pulmonary:     Effort: Pulmonary effort is normal.     Breath sounds: Normal breath sounds.  Abdominal:     Palpations: Abdomen is soft.  Musculoskeletal:        General: Normal range of motion.     Cervical back: Normal range of motion.  Skin:    General: Skin is warm.     Capillary Refill: Capillary refill takes less than 2 seconds.     Findings: Bruising present.  Neurological:     General: No focal deficit present.     Mental Status: She is alert and oriented to person, place, and time.  Psychiatric:        Mood and Affect: Mood normal.        Behavior: Behavior normal.        Thought Content: Thought content normal.        Judgment: Judgment normal.    BP 115/63 (BP Location: Left Arm, Patient Position: Sitting)   Pulse 91   Temp (!) 97.1 F (36.2 C)   Ht 5\' 6"  (1.676 m)   Wt 241 lb 0.6 oz (109.3 kg)   LMP  (LMP Unknown)   SpO2 96%   BMI 38.90 kg/m  Wt Readings from Last 3 Encounters:  04/01/21 241 lb 0.6 oz (109.3 kg)  02/24/21 240 lb (108.9 kg)  02/09/21 243 lb 12.8 oz (110.6 kg)     Health Maintenance Due  Topic Date Due    MAMMOGRAM  02/28/2021    There are no preventive care reminders to display for this patient.  Lab Results  Component Value Date   TSH 0.74 09/05/2018   Lab Results  Component Value Date   WBC 4.8 05/20/2020   HGB 13.7 05/20/2020   HCT 41.4 05/20/2020   MCV 91.6 05/20/2020   PLT 37 (L) 05/20/2020   Lab Results  Component Value Date   NA 141 05/20/2020   K 4.3 05/20/2020   CO2 28 05/20/2020   GLUCOSE 76 05/20/2020   BUN 11 05/20/2020   CREATININE 0.76 05/20/2020   BILITOT 0.6 05/20/2020   ALKPHOS 71 05/20/2020   AST 19 05/20/2020   ALT 28 05/20/2020   PROT 5.6 (L) 05/20/2020   ALBUMIN 3.4 (L) 05/20/2020   CALCIUM 9.0 05/20/2020   ANIONGAP 7 05/20/2020   GFR 93.62 12/25/2019   Lab Results  Component Value Date   CHOL 128 12/31/2019   Lab Results  Component Value Date   HDL 52 12/31/2019   Lab Results  Component Value Date   LDLCALC 63 12/31/2019   Lab Results  Component Value Date   TRIG 61 12/31/2019   Lab Results  Component Value Date   CHOLHDL 2.5 12/31/2019   Lab Results  Component Value Date   HGBA1C 5.0 07/09/2020  Assessment & Plan:   Problem List Items Addressed This Visit       Respiratory   COPD (chronic obstructive pulmonary disease) ([place]) - Primary Stable on current regimen Discussed smoking cessation  Will resume efforts to quit again     Hematopoietic and Hemostatic   Other pancytopenia ([place]) Stable      Other   Major depression, recurrent ([place])   MDS (myelodysplastic syndrome), low grade ([place]) Stable continue to follow up with hematology  Labs stable    Other Visit Diagnoses     Tobacco use disorder, continuous     Persistent  Discussed the risk factors associated with smoking ; CAD, COPD, Cancer, PVD increased susceptibility to respiratory illnesses Discussed treatment options with cessation ie counseling, support resources and available medications  Choosing a quit day and setting goals accordingly. Discussed  ways to quit; start by decreasing one cigarette per day or per week.  Encourage patient to call for assistance once ready to quit. Provided education handouts   Counseling 5-10 minutes    Encounter for screening mammogram for malignant neoplasm of breast       Relevant Orders   MM Digital Screening       No orders of the defined types were placed in this encounter.   Follow-up: Return in about 6 months (around 10/01/2021) for follow up medication management [address].    [name], NP

## 2021-04-06 ENCOUNTER — Ambulatory Visit (INDEPENDENT_AMBULATORY_CARE_PROVIDER_SITE_OTHER): Payer: Medicaid Other | Admitting: Obstetrics and Gynecology

## 2021-04-06 ENCOUNTER — Other Ambulatory Visit: Payer: Self-pay

## 2021-04-06 ENCOUNTER — Encounter: Payer: Self-pay | Admitting: Obstetrics and Gynecology

## 2021-04-06 VITALS — BP 151/88 | HR 92 | Wt 238.0 lb

## 2021-04-06 DIAGNOSIS — Z1231 Encounter for screening mammogram for malignant neoplasm of breast: Secondary | ICD-10-CM | POA: Diagnosis not present

## 2021-04-06 DIAGNOSIS — N3946 Mixed incontinence: Secondary | ICD-10-CM | POA: Diagnosis not present

## 2021-04-06 MED ORDER — SOLIFENACIN SUCCINATE 10 MG PO TABS: 10.0000 mg | ORAL_TABLET | Freq: Every day | ORAL | 11 refills | Status: DC

## 2021-04-06 NOTE — Progress Notes (Signed)
Ms Cottone presents for f/u of her urinary incont. Last seen 5/21. Was switched from [facility] to [facility]. However she continue with Vesicare. She reports doing well with Vesicare. Only 1-3 accidents a month now. Tolerating well  PE AF VSS Lungs clear Heart RRR Abd soft + BS  A/P Urinary Incont        Screening mammogram  Will refill Vesicare 10 mg po qd. Screening mammogram ordered F/U in 1 yr or PRN

## 2021-04-06 NOTE — Patient Instructions (Signed)
Mammogram A mammogram is an X-ray of the breasts. This is done to check for changes that are not normal. This test can look for changes that may be caused by breastcancer or other problems. Mammograms are regularly done on women beginning at age 50. A man may have amammogram if he has a lump or swelling in his breast. Tell a doctor: About any allergies you have. If you have breast implants. If you have had breast disease, biopsy, or surgery. If you have a family history of breast cancer. If you are breastfeeding. Whether you are pregnant or may be pregnant. What are the risks? Generally, this is a safe procedure. But problems may occur, including: Being exposed to radiation. Radiation levels are very low with this test. The need for more tests. The results were not read properly. Trouble finding breast cancer in women with dense breasts. What happens before the test? Have this test done about 1-2 weeks after your menstrual period. This is often when your breasts are the least tender. If you are visiting a new doctor or clinic, have any past mammogram images sent to your new doctor's office. Wash your breasts and under your arms on the day of the test. Do not use deodorants, perfumes, lotions, or powders on the day of the test. Take off any jewelry from your neck. Wear clothes that you can change into and out of easily. What happens during the test?  You will take off your clothes from the waist up. You will put on a gown. You will stand in front of the X-ray machine. Each breast will be placed between two plastic or glass plates. The plates will press down on your breast for a few seconds. Try to relax. This does not cause any harm to your breasts. It may not feel comfortable, but it will be very brief. X-rays will be taken from different angles of each breast. The procedure may vary among doctors and hospitals. What can I expect after the test? The mammogram will be read by a  specialist (radiologist). You may need to do parts of the test again. This depends on the quality of the images. You may go back to your normal activities. It is up to you to get the results of your test. Ask how to get your results when they are ready. Summary A mammogram is an X-ray of the breasts. It looks for changes that may be caused by breast cancer or other problems. A man may have this test if he has a lump or swelling in his breast. Before the test, tell your doctor about any breast problems that you have had in the past. Have this test done about 1-2 weeks after your menstrual period. Ask when your test results will be ready. Make sure you get your test results. This information is not intended to replace advice given to you by your health care provider. Make sure you discuss any questions you have with your healthcare provider. Document Revised: 08/03/2020 Document Reviewed: 08/03/2020 Elsevier Patient Education  2022 [employer].

## 2021-04-06 NOTE — Progress Notes (Signed)
GYN presents for medication refills on Vesicare

## 2021-04-13 ENCOUNTER — Ambulatory Visit (HOSPITAL_BASED_OUTPATIENT_CLINIC_OR_DEPARTMENT_OTHER)
Admission: RE | Admit: 2021-04-13 | Discharge: 2021-04-13 | Disposition: A | Discharge status: Home / Self Care | Payer: Medicaid Other | Source: Ambulatory Visit | Attending: Obstetrics and Gynecology | Admitting: Obstetrics and Gynecology

## 2021-04-13 ENCOUNTER — Ambulatory Visit (HOSPITAL_BASED_OUTPATIENT_CLINIC_OR_DEPARTMENT_OTHER): Payer: Medicaid Other | Admitting: Radiology

## 2021-04-13 ENCOUNTER — Other Ambulatory Visit: Payer: Self-pay

## 2021-04-13 DIAGNOSIS — Z1231 Encounter for screening mammogram for malignant neoplasm of breast: Secondary | ICD-10-CM | POA: Insufficient documentation

## 2021-04-13 IMAGING — DX DG LUMBAR SPINE COMPLETE W/ BEND
7 series · 7 of 7 positions shown · non-contrast
Comparison: None.

CLINICAL DATA: Chronic midline LOWER back pain.  No sciatica.

EXAM:
LUMBAR SPINE - COMPLETE WITH BENDING VIEWS

[l-spine ap]
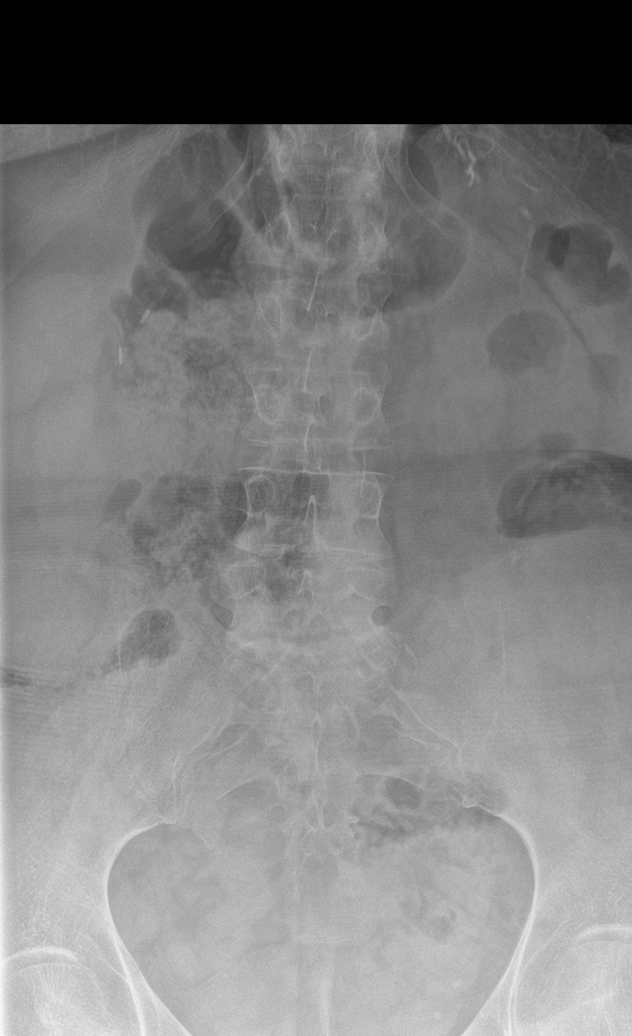

[l-spine obl (1 of 2)]
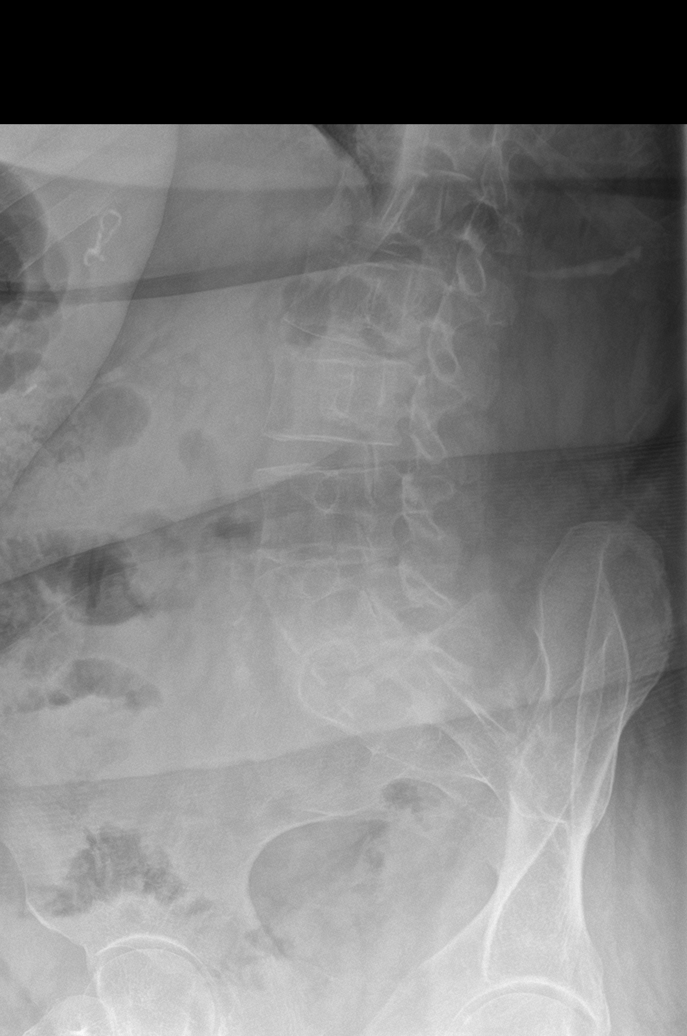

[l-spine obl (2 of 2)]
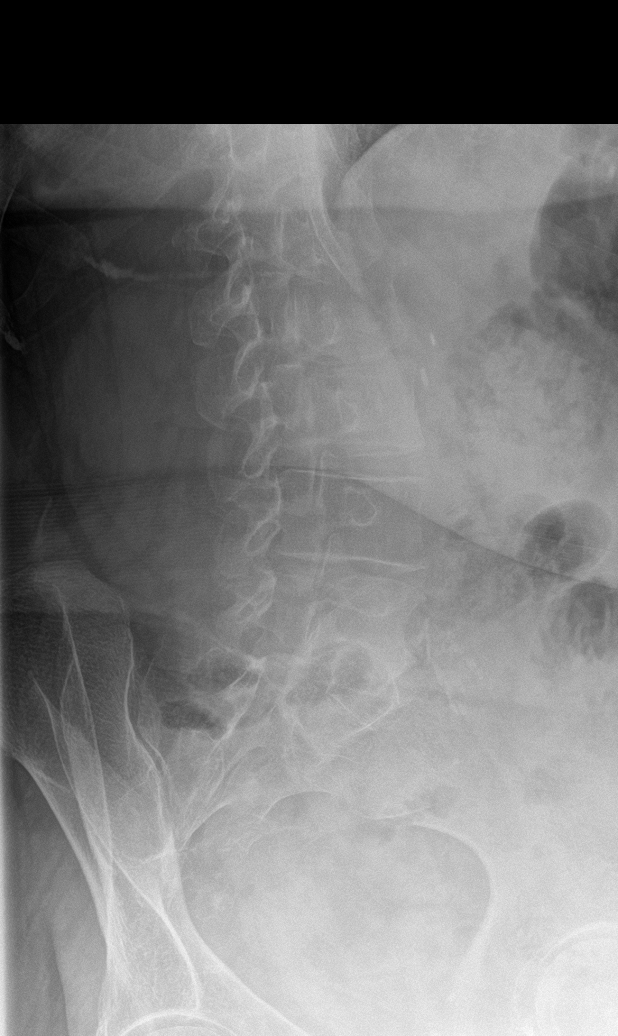

[l-spine lat]
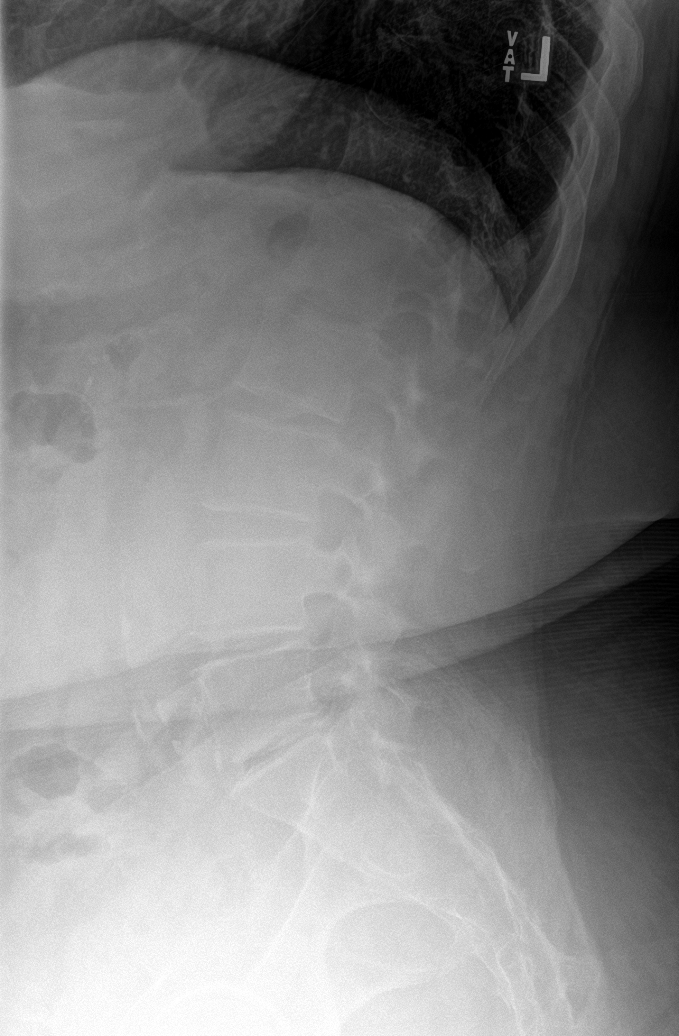

[l-spine flex]
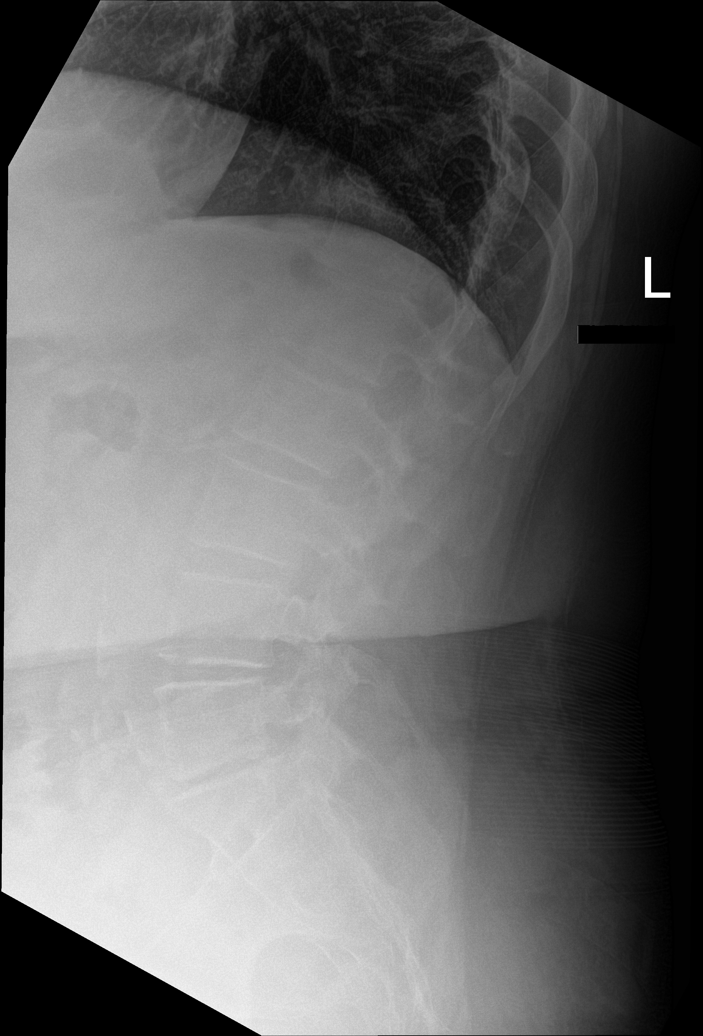

[l-spine ext]
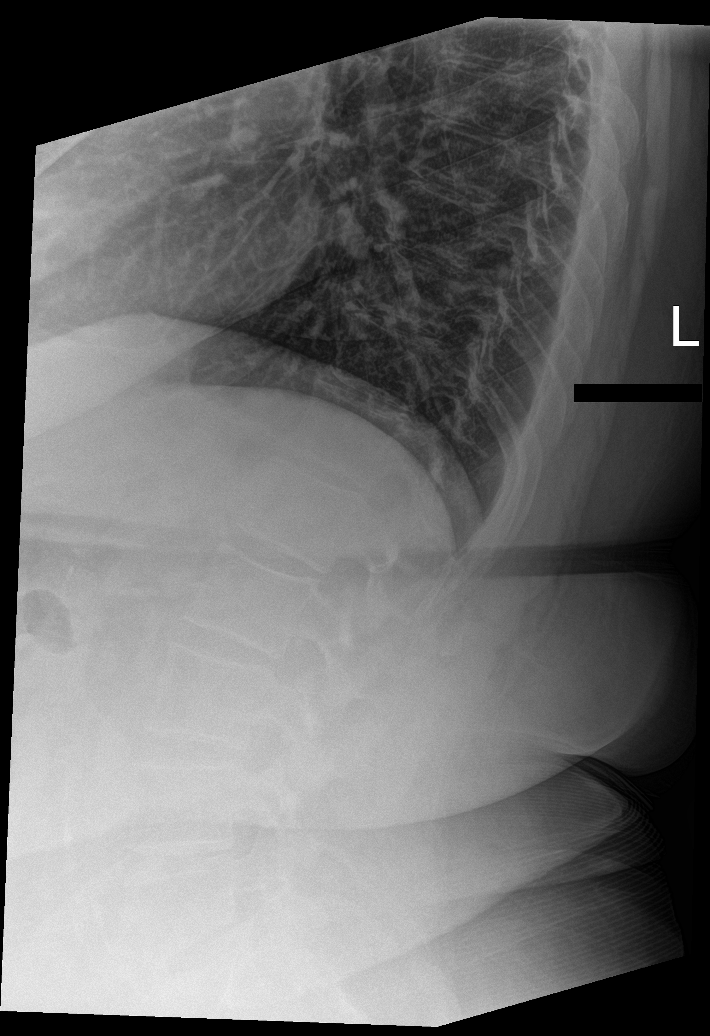

[l-spine spot]
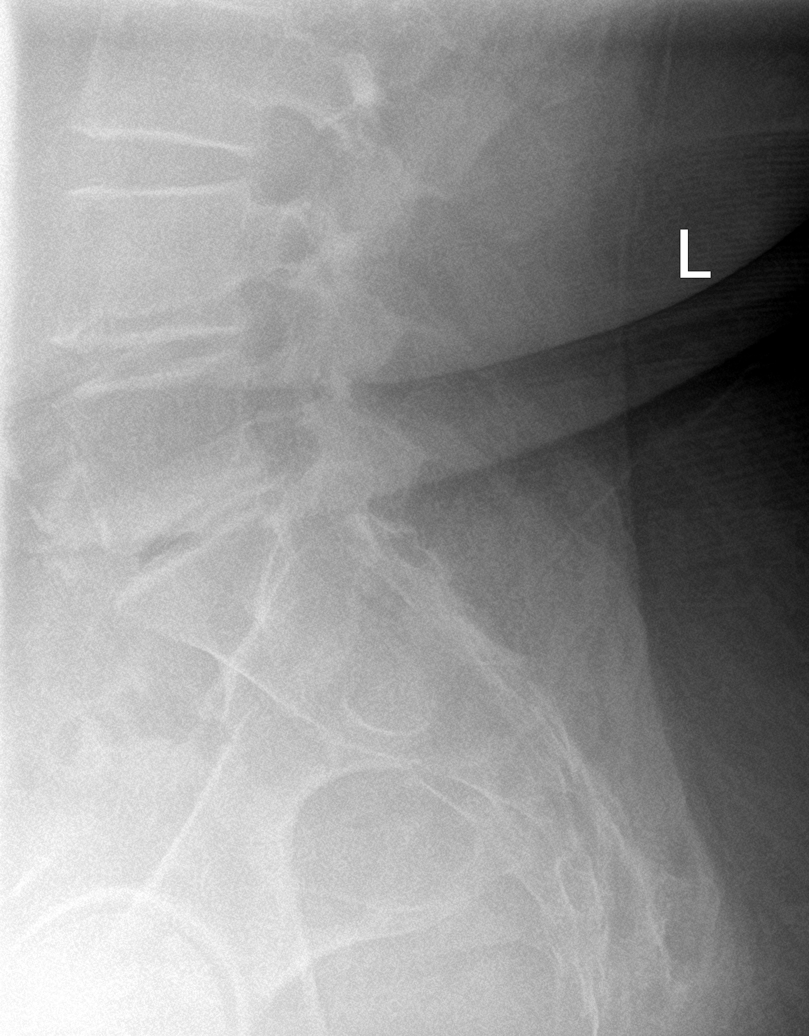

[7 of 7 positions shown; findings below may reference images not displayed]

FINDINGS: Normal alignment of the lumbar spine. There is disc height loss at
L4-5 and L5-S1, associated with uncovertebral spurring. No acute
fracture or traumatic subluxation. There is-chronic calcification of
the abdominal aorta.

Bowel gas pattern is nonobstructive. Surgical clips are present in
the RIGHT UPPER QUADRANT.
IMPRESSION: 1. Degenerative changes in the lower lumbar spine.
2. No evidence for acute abnormality.

## 2021-04-20 ENCOUNTER — Ambulatory Visit
Admission: EM | Admit: 2021-04-20 | Discharge: 2021-04-20 | Disposition: A | Discharge status: Home / Self Care | Payer: Medicaid Other | Attending: Emergency Medicine | Admitting: Emergency Medicine

## 2021-04-20 DIAGNOSIS — R1013 Epigastric pain: Secondary | ICD-10-CM

## 2021-04-20 DIAGNOSIS — R059 Cough, unspecified: Secondary | ICD-10-CM | POA: Diagnosis not present

## 2021-04-20 DIAGNOSIS — R112 Nausea with vomiting, unspecified: Secondary | ICD-10-CM

## 2021-04-20 LAB — POCT URINALYSIS DIP (MANUAL ENTRY)
Blood, UA: NEGATIVE
Glucose, UA: NEGATIVE mg/dL
Ketones, POC UA: NEGATIVE mg/dL
Leukocytes, UA: NEGATIVE
Nitrite, UA: NEGATIVE
Spec Grav, UA: >=1.03 — AB (ref 1.010–1.025)
Urobilinogen, UA: 0.2 E.U./dL
pH, UA: 5.5 (ref 5.0–8.0)

## 2021-04-20 MED ORDER — PROMETHAZINE HCL 25 MG PO TABS: 25.0000 mg | ORAL_TABLET | Freq: Four times a day (QID) | ORAL | 0 refills | Status: AC | PRN

## 2021-04-20 MED ORDER — PREDNISONE 20 MG PO TABS: 40.0000 mg | ORAL_TABLET | Freq: Every day | ORAL | 0 refills | Status: AC

## 2021-04-20 MED ORDER — ONDANSETRON HCL 4 MG/2ML IJ SOLN
4.0000 mg | Freq: Once | INTRAMUSCULAR | Status: AC
  Administered 2021-04-20: 4 mg via INTRAVENOUS

## 2021-04-20 MED ORDER — BENZONATATE 200 MG PO CAPS: 200.0000 mg | ORAL_CAPSULE | Freq: Three times a day (TID) | ORAL | 0 refills | Status: AC | PRN

## 2021-04-20 MED ORDER — ONDANSETRON HCL 4 MG/2ML IJ SOLN: 4.0000 mg | Freq: Once | INTRAMUSCULAR | Status: DC

## 2021-04-20 MED ORDER — SODIUM CHLORIDE 0.9 % IV BOLUS
1000.0000 mL | Freq: Once | INTRAVENOUS | Status: AC
  Administered 2021-04-20: 1000 mL via INTRAVENOUS

## 2021-04-20 MED ORDER — MECLIZINE HCL 25 MG PO TABS: 25.0000 mg | ORAL_TABLET | Freq: Three times a day (TID) | ORAL | 0 refills | Status: DC | PRN

## 2021-04-20 MED ORDER — ONDANSETRON 4 MG PO TBDP: 4.0000 mg | ORAL_TABLET | Freq: Three times a day (TID) | ORAL | 0 refills | Status: DC | PRN

## 2021-04-20 NOTE — ED Provider Notes (Signed)
[name]    CSN: [id number] Arrival date & time: 04/20/21  4627      History   Chief Complaint Chief Complaint  Patient presents with   Emesis     HPI Barbara Stanley is a 50 y.o. female history of COPD, GERD, tobacco use, prior cholecystectomy, gastric bypass, presenting today for evaluation of vomiting.  Reports 3 days of nausea and vomiting with epigastric discomfort.  Reports that she has had continued nausea and vomiting with poor appetite and poor oral intake over the past 3 days.  Does report diarrhea, but this can be normal for her.  Typically will take medicine to help with looser stools, but has been unable to take this in the past couple of days.  Denies blood in stool or vomit.  She denies any sharp stabbing abdominal pain, mainly soreness.  She denies chest pain or shortness of breath.  Does report slight increase in cough.  Reports approximately 5 cigarettes/day tobacco use.  Denies alcohol use.  Denies fevers, has had some hot and cold chills.  Denies history of hypertension, diabetes.  Denies close sick contacts.  Since onset of nausea and vomiting she has also developed dizziness which she describes as a spinning sensation.  Will happen at rest as well as with changing position.  Has had some mild congestion/sinus symptoms after vomiting, but this has not been persistent.  Patient also references her history of myelodysplastic syndrome  HPI  Past Medical History:  Diagnosis Date   Abdominal pain    Anemia    Anxiety    Bronchitis    COPD (chronic obstructive pulmonary disease) (HCC)    Depression    bipolar   Diarrhea    Emphysema    GERD (gastroesophageal reflux disease)    History of blood transfusion    MDS (myelodysplastic syndrome) ([place])    Splenomegaly    r/t low platelets   Thrombocytopenia (HCC)    Tobacco abuse     Patient Active Problem List   Diagnosis Date Noted   Malnutrition, unspecified type ([place]) 04/01/2021   Former smoker  02/09/2021   Healthcare maintenance 10/12/2020   Iron deficiency anemia 11/12/2019   Splenomegaly, congestive, chronic 09/05/2019   Chronic ITP (idiopathic thrombocytopenia) ([place]) 08/28/2019   Urinary incontinence, mixed 08/13/2019   MDS (myelodysplastic syndrome), low grade ([place]) 07/26/2019   Vitamin D deficiency 10/08/2018   Vitamin B12 deficiency due to intestinal malabsorption 10/08/2018   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/27/2018   Dietary copper deficiency 09/27/2018   Other pancytopenia ([place]) 09/22/2018   Thrombocytopenia ([place]) 08/08/2018   MDD (major depressive disorder), single episode, 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/2014   PTSD (post-traumatic stress disorder) 11/05/2014   Depressive disorder 06/04/2014   Anxiety disorder 06/04/2014   Major depression, recurrent ([place]) 03/21/2014   Suicide attempt ([place]) 03/21/2014   Suicidal ideations 03/21/2014   MDD (major depressive disorder) 03/21/2014   Nonspecific abnormal unspecified cardiovascular function study 09/30/2013   Tobacco use disorder 09/30/2013   COPD (chronic obstructive pulmonary disease) (HCC)    GERD (gastroesophageal reflux disease)    Anemia     Past Surgical History:  Procedure Laterality Date   ABDOMINAL HYSTERECTOMY     c section x 3      CHOLECYSTECTOMY     GASTRIC BYPASS     UPPER GASTROINTESTINAL ENDOSCOPY     WISDOM TOOTH EXTRACTION      OB History     Gravida  4   Para  Term      Preterm      AB      Living  3      SAB      IAB      Ectopic      Multiple      Live Births               Home Medications    Prior to Admission medications   Medication Sig Start Date End Date Taking? Authorizing Provider  benzonatate (TESSALON) 200 MG capsule Take 1 capsule (200 mg total) by mouth 3 (three) times daily as needed for up to 7 days for cough. 04/20/21 04/27/21 Yes Fredia Chittenden C, PA-C  meclizine (ANTIVERT) 25 MG tablet Take 1 tablet (25 mg total) by mouth 3 (three) times daily as  needed for dizziness. May cause drowsiness, avoid use with phenergan 04/20/21  Yes Winni Ehrhard C, PA-C  ondansetron (ZOFRAN ODT) 4 MG disintegrating tablet Take 1-2 tablets (4-8 mg total) by mouth every 8 (eight) hours as needed for nausea or vomiting. 04/20/21  Yes Sharonne Ricketts C, PA-C  predniSONE (DELTASONE) 20 MG tablet Take 2 tablets (40 mg total) by mouth daily with breakfast for 5 days. 04/20/21 04/25/21 Yes Shalena Ezzell C, PA-C  promethazine (PHENERGAN) 25 MG tablet Take 1 tablet (25 mg total) by mouth every 6 (six) hours as needed for refractory nausea / vomiting. 04/20/21  Yes Deago Burruss C, PA-C  Accu-Chek Softclix Lancets lancets USE AS DIRECTED USE TWO TIMES A DAY 01/08/21   [name], NP  acetaminophen (TYLENOL) 500 MG tablet Take 1,000 mg by mouth daily as needed for moderate pain.    [provider]  albuterol (PROVENTIL) (2.5 MG/3ML) 0.083% nebulizer solution Take 3 mLs (2.5 mg total) by nebulization every 4 (four) hours as needed for wheezing or shortness of breath. 05/30/19   Icard, Octavio Graves, DO  albuterol (VENTOLIN HFA) 108 (90 Base) MCG/ACT inhaler Inhale 1-2 puffs into the lungs every 6 (six) hours as needed for wheezing or shortness of breath. 02/09/21   Martyn Ehrich, NP  busPIRone (BUSPAR) 5 MG tablet TAKE 1 TABLET(5 MG) BY MOUTH THREE TIMES DAILY 08/17/20   [name], NP  cholecalciferol (VITAMIN D3) 25 MCG (1000 UT) tablet Take 5,000 Units by mouth daily.    [provider]  colestipol (COLESTID) 1 g tablet Take 2 tablets (2 g total) by mouth 2 (two) times daily. 02/24/21   Thornton Park, MD  Copper Gluconate 2 MG CAPS Take 4 mg by mouth daily.    [provider]  Cyanocobalamin (VITAMIN B-12 IJ) Inject 1 mg as directed as directed. Every 3 months Patient not taking: Reported on 04/01/2021    [provider]  diclofenac sodium (VOLTAREN) 1 % GEL Apply 2 g topically 4 (four) times daily. 07/24/19   Azzie Glatter, FNP   dicyclomine (BENTYL) 20 MG tablet Take 1 tablet (20 mg total) by mouth in the morning, at noon, in the evening, and at bedtime. 02/24/21   Thornton Park, MD  eltrombopag (PROMACTA) 50 MG tablet Take by mouth. 06/30/20   [provider]  esomeprazole (NEXIUM) 40 MG capsule Take 1 capsule (40 mg total) by mouth 2 (two) times daily. 02/24/21   Thornton Park, MD  famotidine (PEPCID) 20 MG tablet Take 1 tablet (20 mg total) by mouth 2 (two) times daily. 02/24/21   Thornton Park, MD  fluticasone-salmeterol (ADVAIR HFA) [phone] MCG/ACT inhaler Inhale 2 puffs  into the lungs 2 (two) times daily. 02/09/21   Martyn Ehrich, NP  folic acid (FOLVITE) 1 MG tablet TAKE 1 TABLET(1 MG) BY MOUTH DAILY 03/26/20   Heath Lark, MD  nicotine (NICODERM CQ) 21 mg/24hr patch Place 1 patch (21 mg total) onto the skin daily. Patient not taking: Reported on 04/01/2021 10/12/20   Lauraine Rinne, NP  nicotine polacrilex (COMMIT) 4 MG lozenge Take 1 lozenge (4 mg total) by mouth as needed for smoking cessation. 10/12/20   Lauraine Rinne, NP  solifenacin (VESICARE) 10 MG tablet Take 1 tablet (10 mg total) by mouth daily. 04/06/21   Chancy Milroy, MD  Tiotropium Bromide Monohydrate (SPIRIVA RESPIMAT) 2.5 MCG/ACT AERS Inhale 2 puffs into the lungs daily. 02/09/21   Martyn Ehrich, NP  traZODone (DESYREL) 150 MG tablet Take 1 tablet (150 mg total) by mouth at bedtime. For sleep Patient taking differently: Take 400 mg by mouth at bedtime. For sleep 11/08/14   Rankin, Shuvon B, NP  venlafaxine XR (EFFEXOR-XR) 75 MG 24 hr capsule Take 1 capsule (75 mg total) by mouth daily with breakfast. For depression 11/08/14   Rankin, Shuvon B, NP    Family History Family History  Problem Relation Age of Onset   Diabetes Mother    Diabetes Other    Hypertension Other    CAD Other    Heart disease Father    Colon cancer Neg Hx    Rectal cancer Neg Hx    Stomach cancer Neg Hx    Esophageal cancer Neg Hx     Social  History Social History   Tobacco Use   Smoking status: Every Day    Packs/day: 0.50    Years: 30.00    Pack years: 15.00    Types: Cigarettes    Last attempt to quit: 01/31/2021    Years since quitting: 0.2   Smokeless tobacco: Never   Tobacco comments:    quit mid april 2022  Vaping Use   Vaping Use: Never used  Substance Use Topics   Alcohol use: Yes    Comment: occasional wine   Drug use: No     Allergies   Other   Review of Systems Review of Systems  Constitutional:  Negative for fatigue and fever.  HENT:  Negative for congestion and mouth sores.   Eyes:  Negative for visual disturbance.  Respiratory:  Positive for cough. Negative for shortness of breath.   Cardiovascular:  Negative for chest pain.  Gastrointestinal:  Positive for abdominal pain, nausea and vomiting.  Genitourinary:  Negative for genital sores.  Musculoskeletal:  Negative for arthralgias and joint swelling.  Skin:  Negative for color change, rash and wound.  Neurological:  Negative for dizziness, weakness, light-headedness and headaches.    Physical Exam Triage Vital Signs ED Triage Vitals  Enc Vitals Group     BP      Pulse      Resp      Temp      Temp src      SpO2      Weight      Height      Head Circumference      Peak Flow      Pain Score      Pain Loc      Pain Edu?      Excl. in [place]?    No data found.  Updated Vital Signs BP 115/70 (BP Location: Left Arm)   Pulse 88  Temp 98.4 F (36.9 C) (Oral)   Resp 18   LMP  (LMP Unknown)   SpO2 93%   Visual Acuity Right Eye Distance:   Left Eye Distance:   Bilateral Distance:    Right Eye Near:   Left Eye Near:    Bilateral Near:     Physical Exam Vitals and nursing note reviewed.  Constitutional:      Appearance: She is well-developed.     Comments: No acute distress  HENT:     Head: Normocephalic and atraumatic.     Ears:     Comments: Bilateral ears without tenderness to palpation of external auricle, tragus  and mastoid, EAC's without erythema or swelling, TM's with good bony landmarks and cone of light. Non erythematous.      Nose: Nose normal.     Mouth/Throat:     Comments: Oral mucosa pink and moist, no tonsillar enlargement or exudate. Posterior pharynx patent and nonerythematous, no uvula deviation or swelling. Normal phonation.  Eyes:     Conjunctiva/sclera: Conjunctivae normal.  Cardiovascular:     Rate and Rhythm: Normal rate.  Pulmonary:     Effort: Pulmonary effort is normal. No respiratory distress.     Comments: Breathing comfortably at rest, mild coarseness/wheezing noted throughout left lung fields, inconsistent Abdominal:     General: There is no distension.     Comments: Well-healed midline incision across midline of abdomen, mild tenderness to palpation in epigastrium, nontender to bilateral lower quadrants, negative rebound, negative Rovsing, negative Murphy's  Musculoskeletal:        General: Normal range of motion.     Cervical back: Neck supple.  Skin:    General: Skin is warm and dry.  Neurological:     Mental Status: She is alert and oriented to person, place, and time.     UC Treatments / Results  Labs (all labs ordered are listed, but only abnormal results are displayed) Labs Reviewed  POCT URINALYSIS DIP (MANUAL ENTRY) - Abnormal; Notable for the following components:      Result Value   Color, UA brown (*)    Bilirubin, UA small (*)    Spec Grav, UA >=1.030 (*)    Protein Ur, POC trace (*)    All other components within normal limits  NOVEL CORONAVIRUS, NAA  CBC WITH DIFFERENTIAL/PLATELET  COMPREHENSIVE METABOLIC PANEL  LIPASE    EKG   Radiology No results found.  Procedures Procedures (including critical care time)  Medications Ordered in UC Medications  sodium chloride 0.9 % bolus 1,000 mL (0 mLs Intravenous Stopped 04/20/21 1055)  ondansetron (ZOFRAN) injection 4 mg (4 mg Intravenous Given 04/20/21 0928)    Initial Impression /  Assessment and Plan / UC Course  I have reviewed the triage vital signs and the nursing notes.  Pertinent labs & imaging results that were available during my care of the patient were reviewed by me and considered in my medical decision making (see chart for details).  Clinical Course as of 04/20/21 1100  Tue Apr 20, 2021  0934 EKG sinus bradycardia at 58, no acute signs of ischemia or infarction. [HW]  1009 Checked on patient, sitting comfortably in chair, reports nausea improved, but still present [HW]    Clinical Course User Index [HW] Haivyn Oravec, Elesa Hacker, PA-C    50 year old female with 3 days nausea, vomiting, provided 1 L normal saline fluid bolus, patient was feeling slightly improved along with IV Zofran.  We will continue with Zofran at home,  Phenergan for refractory nausea and vomiting.  Provided meclizine to use for dizziness, but recommended avoiding using combination with Phenergan to avoid further drowsiness.  Rest and fluids, continue oral rehydration at home and monitoring for gradual resolution of symptoms potentially caused by underlying viral gastroenteritis.  CMP, CBC and lipase pending, will call with results of changing plan.  Cough-Tessalon for cough, pair with Mucinex as tolerating oral intake, continue inhalers, printed prednisone course if symptoms not improving with inhaler alone.  Discussed strict return precautions. Patient verbalized understanding and is agreeable with plan.  Final Clinical Impressions(s) / UC Diagnoses   Final diagnoses:  Abdominal pain, epigastric  Non-intractable vomiting with nausea, unspecified vomiting type  Cough     Discharge Instructions      COVID test pending, monitor MyChart for results Blood work pending-I will call if these results are abnormal Rest and fluids Zofran dissolved in mouth as needed for nausea and vomiting Phenergan for persistent nausea or vomiting not relieved by Zofran-may cause drowsiness Use meclizine  for dizziness/nausea-avoid use with Phenergan to avoid increased drowsiness  Cough- use tessalon every 8 hours, may use with mucinex dm over-the-counter twice daily as needed for further relief of cough and congestion Continue inhalers as prescribed; use albuterol inhaler as needed for shortness of breath chest tightness wheezing Prednisone course if developing more wheezing/shortness of breath  Please follow-up if any symptoms not improving or worsening     ED Prescriptions     Medication Sig Dispense Auth. Provider   ondansetron (ZOFRAN ODT) 4 MG disintegrating tablet Take 1-2 tablets (4-8 mg total) by mouth every 8 (eight) hours as needed for nausea or vomiting. 20 tablet Jozette Castrellon C, PA-C   promethazine (PHENERGAN) 25 MG tablet Take 1 tablet (25 mg total) by mouth every 6 (six) hours as needed for refractory nausea / vomiting. 30 tablet Eustolia Drennen C, PA-C   meclizine (ANTIVERT) 25 MG tablet Take 1 tablet (25 mg total) by mouth 3 (three) times daily as needed for dizziness. May cause drowsiness, avoid use with phenergan 30 tablet Adrion Menz C, PA-C   benzonatate (TESSALON) 200 MG capsule Take 1 capsule (200 mg total) by mouth 3 (three) times daily as needed for up to 7 days for cough. 28 capsule Arrin Ishler C, PA-C   predniSONE (DELTASONE) 20 MG tablet Take 2 tablets (40 mg total) by mouth daily with breakfast for 5 days. 10 tablet Keilen Kahl, [place] C, PA-C      PDMP not reviewed this encounter.   Janith Lima, PA-C 04/20/21 1102

## 2021-04-20 NOTE — ED Triage Notes (Signed)
Patient presents to Urgent Care with complaints of dizziness, chills, vomiting x 3  days. Pt states she had a negative covid test yesterday. Treating symptoms with zofran.   Denies fever.

## 2021-04-20 NOTE — Discharge Instructions (Addendum)
COVID test pending, monitor MyChart for results Blood work pending-I will call if these results are abnormal Rest and fluids Zofran dissolved in mouth as needed for nausea and vomiting Phenergan for persistent nausea or vomiting not relieved by Zofran-may cause drowsiness Use meclizine for dizziness/nausea-avoid use with Phenergan to avoid increased drowsiness  Cough- use tessalon every 8 hours, may use with mucinex dm over-the-counter twice daily as needed for further relief of cough and congestion Continue inhalers as prescribed; use albuterol inhaler as needed for shortness of breath chest tightness wheezing Prednisone course if developing more wheezing/shortness of breath  Please follow-up if any symptoms not improving or worsening

## 2021-04-22 LAB — SARS-COV-2, NAA 2 DAY TAT

## 2021-04-22 LAB — NOVEL CORONAVIRUS, NAA: SARS-CoV-2, NAA: NOT DETECTED

## 2021-04-24 LAB — COMPREHENSIVE METABOLIC PANEL
ALT: 12 IU/L (ref 0–32)
AST: 16 IU/L (ref 0–40)
Albumin/Globulin Ratio: 2.6 — ABNORMAL HIGH (ref 1.2–2.2)
Albumin: 4.5 g/dL (ref 3.8–4.8)
Alkaline Phosphatase: 104 IU/L (ref 44–121)
BUN/Creatinine Ratio: 11 (ref 9–23)
BUN: 7 mg/dL (ref 6–24)
Bilirubin Total: 0.5 mg/dL (ref 0.0–1.2)
CO2: 24 mmol/L (ref 20–29)
Calcium: 8.8 mg/dL (ref 8.7–10.2)
Chloride: 103 mmol/L (ref 96–106)
Creatinine, Ser: 0.65 mg/dL (ref 0.57–1.00)
Globulin, Total: 1.7 g/dL (ref 1.5–4.5)
Glucose: 94 mg/dL (ref 65–99)
Potassium: 4.3 mmol/L (ref 3.5–5.2)
Sodium: 143 mmol/L (ref 134–144)
Total Protein: 6.2 g/dL (ref 6.0–8.5)
eGFR: 107 mL/min/{1.73_m2} (ref >59)

## 2021-04-24 LAB — CBC WITH DIFFERENTIAL/PLATELET
Basophils Absolute: 0 10*3/uL (ref 0.0–0.2)
Basos: 1 %
EOS (ABSOLUTE): 0 10*3/uL (ref 0.0–0.4)
Eos: 1 %
Hematocrit: 42 % (ref 34.0–46.6)
Hemoglobin: 12.7 g/dL (ref 11.1–15.9)
Immature Grans (Abs): 0 10*3/uL (ref 0.0–0.1)
Immature Granulocytes: 1 %
Lymphocytes Absolute: 0.5 10*3/uL — ABNORMAL LOW (ref 0.7–3.1)
Lymphs: 13 %
MCH: 30.2 pg (ref 26.6–33.0)
MCHC: 30.2 g/dL — ABNORMAL LOW (ref 31.5–35.7)
MCV: 100 fL — ABNORMAL HIGH (ref 79–97)
Monocytes Absolute: 0.3 10*3/uL (ref 0.1–0.9)
Monocytes: 9 %
Neutrophils Absolute: 2.8 10*3/uL (ref 1.4–7.0)
Neutrophils: 75 %
RBC: 4.21 x10E6/uL (ref 3.77–5.28)
RDW: 14.2 % (ref 11.7–15.4)
WBC: 3.7 10*3/uL (ref 3.4–10.8)

## 2021-04-24 LAB — LIPASE: Lipase: 30 U/L (ref 14–72)

## 2021-05-19 ENCOUNTER — Other Ambulatory Visit: Payer: Self-pay

## 2021-05-19 DIAGNOSIS — R42 Dizziness and giddiness: Secondary | ICD-10-CM

## 2021-05-19 MED ORDER — MECLIZINE HCL 25 MG PO TABS: 25.0000 mg | ORAL_TABLET | Freq: Three times a day (TID) | ORAL | 0 refills | Status: DC | PRN

## 2021-09-02 ENCOUNTER — Other Ambulatory Visit: Payer: Self-pay | Admitting: *Deleted

## 2021-09-02 DIAGNOSIS — Z87891 Personal history of nicotine dependence: Secondary | ICD-10-CM

## 2021-09-02 DIAGNOSIS — F17210 Nicotine dependence, cigarettes, uncomplicated: Secondary | ICD-10-CM

## 2021-09-10 ENCOUNTER — Emergency Department (HOSPITAL_COMMUNITY)
Admission: EM | Admit: 2021-09-10 | Discharge: 2021-09-10 | Disposition: A | Discharge status: Home / Self Care | Payer: Medicaid Other | Attending: Emergency Medicine | Admitting: Emergency Medicine

## 2021-09-10 ENCOUNTER — Encounter (HOSPITAL_COMMUNITY): Payer: Self-pay | Admitting: Emergency Medicine

## 2021-09-10 ENCOUNTER — Other Ambulatory Visit: Payer: Self-pay

## 2021-09-10 DIAGNOSIS — J449 Chronic obstructive pulmonary disease, unspecified: Secondary | ICD-10-CM | POA: Diagnosis not present

## 2021-09-10 DIAGNOSIS — S8011XA Contusion of right lower leg, initial encounter: Secondary | ICD-10-CM | POA: Diagnosis not present

## 2021-09-10 DIAGNOSIS — Z79899 Other long term (current) drug therapy: Secondary | ICD-10-CM | POA: Insufficient documentation

## 2021-09-10 DIAGNOSIS — S8012XA Contusion of left lower leg, initial encounter: Secondary | ICD-10-CM | POA: Diagnosis not present

## 2021-09-10 DIAGNOSIS — S8992XA Unspecified injury of left lower leg, initial encounter: Secondary | ICD-10-CM | POA: Diagnosis present

## 2021-09-10 DIAGNOSIS — X58XXXA Exposure to other specified factors, initial encounter: Secondary | ICD-10-CM | POA: Diagnosis not present

## 2021-09-10 DIAGNOSIS — F17210 Nicotine dependence, cigarettes, uncomplicated: Secondary | ICD-10-CM | POA: Diagnosis not present

## 2021-09-10 DIAGNOSIS — D696 Thrombocytopenia, unspecified: Secondary | ICD-10-CM | POA: Insufficient documentation

## 2021-09-10 DIAGNOSIS — D72819 Decreased white blood cell count, unspecified: Secondary | ICD-10-CM | POA: Insufficient documentation

## 2021-09-10 LAB — CBC WITH DIFFERENTIAL/PLATELET
Abs Immature Granulocytes: 0.01 10*3/uL (ref 0.00–0.07)
Basophils Absolute: 0 10*3/uL (ref 0.0–0.1)
Basophils Relative: 0 %
Eosinophils Absolute: 0 10*3/uL (ref 0.0–0.5)
Eosinophils Relative: 1 %
HCT: 36.6 % (ref 36.0–46.0)
Hemoglobin: 12 g/dL (ref 12.0–15.0)
Immature Granulocytes: 1 %
Lymphocytes Relative: 32 %
Lymphs Abs: 0.5 10*3/uL — ABNORMAL LOW (ref 0.7–4.0)
MCH: 30.2 pg (ref 26.0–34.0)
MCHC: 32.8 g/dL (ref 30.0–36.0)
MCV: 92.2 fL (ref 80.0–100.0)
Monocytes Absolute: 0.2 10*3/uL (ref 0.1–1.0)
Monocytes Relative: 11 %
Neutro Abs: 1 10*3/uL — ABNORMAL LOW (ref 1.7–7.7)
Neutrophils Relative %: 55 %
Platelets: 15 10*3/uL — CL (ref 150–400)
RBC: 3.97 MIL/uL (ref 3.87–5.11)
RDW: 14.2 % (ref 11.5–15.5)
WBC: 1.7 10*3/uL — ABNORMAL LOW (ref 4.0–10.5)
nRBC: 0 % (ref 0.0–0.2)

## 2021-09-10 LAB — URINALYSIS, ROUTINE W REFLEX MICROSCOPIC
Bacteria, UA: NONE SEEN
Bilirubin Urine: NEGATIVE
Glucose, UA: NEGATIVE mg/dL
Hgb urine dipstick: NEGATIVE
Ketones, ur: 5 mg/dL — AB
Leukocytes,Ua: NEGATIVE
Nitrite: NEGATIVE
Protein, ur: 30 mg/dL — AB
Specific Gravity, Urine: 1.028 (ref 1.005–1.030)
pH: 5 (ref 5.0–8.0)

## 2021-09-10 LAB — COMPREHENSIVE METABOLIC PANEL
ALT: 12 U/L (ref 0–44)
AST: 18 U/L (ref 15–41)
Albumin: 3.8 g/dL (ref 3.5–5.0)
Alkaline Phosphatase: 83 U/L (ref 38–126)
Anion gap: 7 (ref 5–15)
BUN: 8 mg/dL (ref 6–20)
CO2: 32 mmol/L (ref 22–32)
Calcium: 8.2 mg/dL — ABNORMAL LOW (ref 8.9–10.3)
Chloride: 99 mmol/L (ref 98–111)
Creatinine, Ser: 0.62 mg/dL (ref 0.44–1.00)
GFR, Estimated: >60 mL/min (ref >60)
Glucose, Bld: 103 mg/dL — ABNORMAL HIGH (ref 70–99)
Potassium: 3 mmol/L — ABNORMAL LOW (ref 3.5–5.1)
Sodium: 138 mmol/L (ref 135–145)
Total Bilirubin: 0.5 mg/dL (ref 0.3–1.2)
Total Protein: 6.3 g/dL — ABNORMAL LOW (ref 6.5–8.1)

## 2021-09-10 LAB — ABO/RH: ABO/RH(D): B POS

## 2021-09-10 LAB — PROTIME-INR
INR: 0.9 (ref 0.8–1.2)
Prothrombin Time: 12.5 seconds (ref 11.4–15.2)

## 2021-09-10 MED ORDER — SODIUM CHLORIDE 0.9 % IV SOLN
10.0000 mL/h | Freq: Once | INTRAVENOUS | Status: AC
  Administered 2021-09-10: 10 mL/h via INTRAVENOUS

## 2021-09-10 MED ORDER — POTASSIUM CHLORIDE CRYS ER 20 MEQ PO TBCR
40.0000 meq | EXTENDED_RELEASE_TABLET | Freq: Once | ORAL | Status: AC
  Administered 2021-09-10: 40 meq via ORAL
  Filled 2021-09-10: qty 2

## 2021-09-10 NOTE — ED Triage Notes (Signed)
Per pt, states she noticed bruising and petechiae on left lower extremity yesterday-notified PCP-told to come to ED for lab work-history of low platelet count

## 2021-09-10 NOTE — ED Notes (Signed)
Called for an update on platelets. Was informed it would be another 15-20 minutes.

## 2021-09-10 NOTE — Discharge Instructions (Signed)
It was a pleasure taking care of you today. As discussed, your platelet count was low today. You were given platelets in the ER. Please call Dr. Cyndi Lennert office on Monday to schedule an appointment for further evaluation. Return to the ER for new or worsening symptoms.

## 2021-09-10 NOTE — ED Provider Notes (Signed)
Emergency Medicine Provider Triage Evaluation Note  Barbara Stanley , a 50 y.o. female  was evaluated in triage.  Pt complains of easy bruising to lower extremities and abdomen.  No known injury.  Patient has a history of thrombocytopenia and was told to report to the ED for further evaluation by PCP.  Admits to some gum bleeding.  She endorses darker urine than normal earlier today.  Denies melena, hematochezia, or hematemesis.  Review of Systems  Positive: Color change Negative: fever  Physical Exam  BP (!) 156/101 (BP Location: Right Arm)   Pulse (!) 112   Temp 98.6 F (37 C) (Oral)   Resp 20   LMP  (LMP Unknown)   SpO2 93%  Gen:   Awake, no distress   Resp:  Normal effort  MSK:   Moves extremities without difficulty  Other:  Bruising to lower extremities  Medical Decision Making  Medically screening exam initiated at 11:44 AM.  Appropriate orders placed.  Barbara Stanley was informed that the remainder of the evaluation will be completed by another provider, this initial triage assessment does not replace that evaluation, and the importance of remaining in the ED until their evaluation is complete.  Labs to check platelet count UA   Suzy Bouchard, PA-C 09/10/21 [place], [place] 09/10/21 1211

## 2021-09-10 NOTE — ED Provider Notes (Signed)
[facility] DEPT Provider Note   CSN: [id number] Arrival date & time: 09/10/21  1130     History Chief Complaint  Patient presents with   Bleeding/Bruising    Barbara Stanley is a 50 y.o. female with a past medical history significant for COPD, GERD, myelodysplastic syndrome, and anxiety who presents to the ED due to bruising to lower extremities and abdomen.  Patient also endorses a petechial rash to arms.  Patient has a history of thrombocytopenia and was advised to report to the ED for further evaluation by PCP.  She also endorses mild gum bleeding.  She notes her urine was darker than normal earlier today.  Denies melena, hematochezia, and hematemesis.  No other bleeding.   History obtained from patient and past medical records. No interpreter used during encounter.       Past Medical History:  Diagnosis Date   Abdominal pain    Anemia    Anxiety    Bronchitis    COPD (chronic obstructive pulmonary disease) (HCC)    Depression    bipolar   Diarrhea    Emphysema    GERD (gastroesophageal reflux disease)    History of blood transfusion    MDS (myelodysplastic syndrome) (HCC)    Splenomegaly    r/t low platelets   Thrombocytopenia (HCC)    Tobacco abuse     Patient Active Problem List   Diagnosis Date Noted   Malnutrition, unspecified type ([place]) 04/01/2021   Former smoker 02/09/2021   Healthcare maintenance 10/12/2020   Iron deficiency anemia 11/12/2019   Splenomegaly, congestive, chronic 09/05/2019   Chronic ITP (idiopathic thrombocytopenia) (HCC) 08/28/2019   Urinary incontinence, mixed 08/13/2019   MDS (myelodysplastic syndrome), low grade ([place]) 07/26/2019   Vitamin D deficiency 10/08/2018   Vitamin B12 deficiency due to intestinal malabsorption 10/08/2018   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/27/2018   Dietary copper deficiency 09/27/2018   Other pancytopenia ([place]) 09/22/2018   Thrombocytopenia ([place]) 08/08/2018   MDD (major depressive  disorder), single episode, severe with psychosis ([place]) 11/05/2014   PTSD (post-traumatic stress disorder) 11/05/2014   Depressive disorder 06/04/2014   Anxiety disorder 06/04/2014   Major depression, recurrent ([place]) 03/21/2014   Suicide attempt ([place]) 03/21/2014   Suicidal ideations 03/21/2014   MDD (major depressive disorder) 03/21/2014   Nonspecific abnormal unspecified cardiovascular function study 09/30/2013   Tobacco use disorder 09/30/2013   COPD (chronic obstructive pulmonary disease) (HCC)    GERD (gastroesophageal reflux disease)    Anemia     Past Surgical History:  Procedure Laterality Date   ABDOMINAL HYSTERECTOMY     c section x 3      CHOLECYSTECTOMY     GASTRIC BYPASS     UPPER GASTROINTESTINAL ENDOSCOPY     WISDOM TOOTH EXTRACTION       OB History     Gravida  4   Para      Term      Preterm      AB      Living  3      SAB      IAB      Ectopic      Multiple      Live Births              Family History  Problem Relation Age of Onset   Diabetes Mother    Diabetes Other    Hypertension Other    CAD Other    Heart disease Father    Colon  cancer Neg Hx    Rectal cancer Neg Hx    Stomach cancer Neg Hx    Esophageal cancer Neg Hx     Social History   Tobacco Use   Smoking status: Every Day    Packs/day: 0.50    Years: 30.00    Pack years: 15.00    Types: Cigarettes    Last attempt to quit: 01/31/2021    Years since quitting: 0.6   Smokeless tobacco: Never   Tobacco comments:    quit mid april 2022  Vaping Use   Vaping Use: Never used  Substance Use Topics   Alcohol use: Yes    Comment: occasional wine   Drug use: No    Home Medications Prior to Admission medications   Medication Sig Start Date End Date Taking? Authorizing Provider  Accu-Chek Softclix Lancets lancets USE AS DIRECTED USE TWO TIMES A DAY Patient taking differently: 1 each 2 (two) times daily. 01/08/21  Yes [name], NP  acetaminophen (TYLENOL)  500 MG tablet Take 1,000 mg by mouth daily as needed for moderate pain.   Yes [provider]  albuterol (PROVENTIL) (2.5 MG/3ML) 0.083% nebulizer solution Take 3 mLs (2.5 mg total) by nebulization every 4 (four) hours as needed for wheezing or shortness of breath. 05/30/19  Yes Icard, Bradley L, DO  busPIRone (BUSPAR) 5 MG tablet TAKE 1 TABLET(5 MG) BY MOUTH THREE TIMES DAILY Patient taking differently: Take 5 mg by mouth 3 (three) times daily. 08/17/20  Yes [name], NP  cholecalciferol (VITAMIN D3) 25 MCG (1000 UT) tablet Take 5,000 Units by mouth daily.   Yes [provider]  colestipol (COLESTID) 1 g tablet Take 2 tablets (2 g total) by mouth 2 (two) times daily. 02/24/21  Yes Thornton Park, MD  Copper Gluconate 2 MG CAPS Take 4 mg by mouth daily.   Yes [provider]  diclofenac sodium (VOLTAREN) 1 % GEL Apply 2 g topically 4 (four) times daily. 07/24/19  Yes Azzie Glatter, FNP  dicyclomine (BENTYL) 20 MG tablet Take 1 tablet (20 mg total) by mouth in the morning, at noon, in the evening, and at bedtime. 02/24/21  Yes Thornton Park, MD  eltrombopag (PROMACTA) 50 MG tablet Take 50 mg by mouth daily. 06/30/20  Yes [provider]  esomeprazole (NEXIUM) 40 MG capsule Take 1 capsule (40 mg total) by mouth 2 (two) times daily. 02/24/21  Yes Thornton Park, MD  famotidine (PEPCID) 20 MG tablet Take 1 tablet (20 mg total) by mouth 2 (two) times daily. 02/24/21  Yes Thornton Park, MD  fluticasone-salmeterol (ADVAIR HFA) 230-21 MCG/ACT inhaler Inhale 2 puffs into the lungs 2 (two) times daily. 02/09/21  Yes Martyn Ehrich, NP  folic acid (FOLVITE) 1 MG tablet TAKE 1 TABLET(1 MG) BY MOUTH DAILY Patient taking differently: Take 1 mg by mouth daily. 03/26/20  Yes Heath Lark, MD  meclizine (ANTIVERT) 25 MG tablet Take 1 tablet (25 mg total) by mouth 3 (three) times daily as needed for dizziness. May cause drowsiness, avoid use with phenergan 05/19/21   Yes King, Diona Foley, NP  nicotine (NICODERM CQ) 21 mg/24hr patch Place 1 patch (21 mg total) onto the skin daily. 10/12/20  Yes Lauraine Rinne, NP  nicotine polacrilex (COMMIT) 4 MG lozenge Take 1 lozenge (4 mg total) by mouth as needed for smoking cessation. 10/12/20  Yes Lauraine Rinne, NP  ondansetron (ZOFRAN ODT) 4 MG disintegrating tablet Take 1-2 tablets (4-8 mg total)  by mouth every 8 (eight) hours as needed for nausea or vomiting. 04/20/21  Yes Wieters, Hallie C, PA-C  promethazine (PHENERGAN) 25 MG tablet Take 1 tablet (25 mg total) by mouth every 6 (six) hours as needed for refractory nausea / vomiting. 04/20/21  Yes Wieters, Hallie C, PA-C  solifenacin (VESICARE) 10 MG tablet Take 1 tablet (10 mg total) by mouth daily. 04/06/21  Yes Chancy Milroy, MD  sulfamethoxazole-trimethoprim (BACTRIM DS) 800-160 MG tablet Take 1 tablet by mouth See admin instructions. Monday, Wednesday, Friday   Yes [provider]  Tiotropium Bromide Monohydrate (SPIRIVA RESPIMAT) 2.5 MCG/ACT AERS Inhale 2 puffs into the lungs daily. 02/09/21  Yes Martyn Ehrich, NP  traMADol (ULTRAM) 50 MG tablet Take 50 mg by mouth daily.   Yes [provider]  traZODone (DESYREL) 150 MG tablet Take 1 tablet (150 mg total) by mouth at bedtime. For sleep Patient taking differently: Take 400 mg by mouth at bedtime. For sleep 11/08/14  Yes Rankin, Shuvon B, NP  venlafaxine XR (EFFEXOR-XR) 75 MG 24 hr capsule Take 1 capsule (75 mg total) by mouth daily with breakfast. For depression 11/08/14  Yes Rankin, Shuvon B, NP  albuterol (VENTOLIN HFA) 108 (90 Base) MCG/ACT inhaler Inhale 1-2 puffs into the lungs every 6 (six) hours as needed for wheezing or shortness of breath. Patient not taking: Reported on 09/10/2021 02/09/21   Martyn Ehrich, NP    Allergies    Other  Review of Systems   Review of Systems  Constitutional:  Negative for chills and fever.  Respiratory:  Negative for shortness of breath.    Cardiovascular:  Negative for chest pain.  Skin:  Positive for color change and rash.  All other systems reviewed and are negative.  Physical Exam Updated Vital Signs BP 135/78   Pulse 82   Temp 98.6 F (37 C) (Oral)   Resp 16   LMP  (LMP Unknown)   SpO2 92%   Physical Exam Vitals and nursing note reviewed.  Constitutional:      General: She is not in acute distress.    Appearance: She is not ill-appearing.  HENT:     Head: Normocephalic.  Eyes:     Pupils: Pupils are equal, round, and reactive to light.  Cardiovascular:     Rate and Rhythm: Normal rate and regular rhythm.     Pulses: Normal pulses.     Heart sounds: Normal heart sounds. No murmur heard.   No friction rub. No gallop.  Pulmonary:     Effort: Pulmonary effort is normal.     Breath sounds: Normal breath sounds.  Abdominal:     General: Abdomen is flat. There is no distension.     Palpations: Abdomen is soft.     Tenderness: There is no abdominal tenderness. There is no guarding or rebound.  Musculoskeletal:        General: Normal range of motion.     Cervical back: Neck supple.  Skin:    General: Skin is warm and dry.     Comments: Petechial rash to bilateral upper extremities.  Mild areas of ecchymosis to bilateral lower extremities and abdomen.  Neurological:     General: No focal deficit present.     Mental Status: She is alert.  Psychiatric:        Mood and Affect: Mood normal.        Behavior: Behavior normal.    ED Results / Procedures / Treatments   Labs (  all labs ordered are listed, but only abnormal results are displayed) Labs Reviewed  CBC WITH DIFFERENTIAL/PLATELET - Abnormal; Notable for the following components:      Result Value   WBC 1.7 (*)    Platelets 15 (*)    Neutro Abs 1.0 (*)    Lymphs Abs 0.5 (*)    All other components within normal limits  COMPREHENSIVE METABOLIC PANEL - Abnormal; Notable for the following components:   Potassium 3.0 (*)    Glucose, Bld 103 (*)     Calcium 8.2 (*)    Total Protein 6.3 (*)    All other components within normal limits  PROTIME-INR  URINALYSIS, ROUTINE W REFLEX MICROSCOPIC  PREPARE PLATELET PHERESIS    EKG None  Radiology No results found.  Procedures Procedures   Medications Ordered in ED Medications  0.9 %  sodium chloride infusion (has no administration in time range)  potassium chloride SA (KLOR-CON) CR tablet 40 mEq (40 mEq Oral Given 09/10/21 1339)    ED Course  I have reviewed the triage vital signs and the nursing notes.  Pertinent labs & imaging results that were available during my care of the patient were reviewed by me and considered in my medical decision making (see chart for details).  Clinical Course as of 09/10/21 1452  Fri Sep 10, 2021  1323 WBC(!): 1.7 [CA]  1323 Platelets(!!): 15 [CA]    Clinical Course User Index [CA] Suzy Bouchard, PA-C   MDM Rules/Calculators/A&P                          50 year old female presents to the ED due to petechial rash and easy bruising.  Patient has a history of myelodysplastic syndrome and follows Dr. Mariea Clonts at [facility].  Upon arrival, stable vitals.  Triage noted patient to be tachycardic 112 however, during my initial evaluation, heart rate in the upper 80s.  Patient no acute distress.  Physical exam significant for petechial rash to bilateral upper extremities.  Mild ecchymosis to lower extremities and a few areas on abdomen.  Labs ordered to check platelet count.  CBC significant for leukopenia at 1.7.  Platelets at 15.  Patient denies any infectious symptoms.  No fever or chills.  CMP significant for hypokalemia at 3.  Potassium repleted here in the ED.  2:26 PM Discussed case with Dr. Lorenso Courier with heme/onc who recommends to transfuse with 1 unit of platelets and then have patient follow-up with Dr. Mariea Clonts next week.   Discussed risk and benefits of platelet transfusion.  Patient agreeable to transfusion.  Patient handed off to [facility], PA-C at shift change pending platelet transfusion. Once complete, patient may be discharged home with outpatient follow-up.  Final Clinical Impression(s) / ED Diagnoses Final diagnoses:  Thrombocytopenia (HCC)  Leukopenia, unspecified type    Rx / DC Orders ED Discharge Orders     None        Suzy Bouchard, PA-C 09/10/21 [place], DO 09/11/21 0700

## 2021-09-13 LAB — PREPARE PLATELET PHERESIS: Unit division: 0

## 2021-09-13 LAB — BPAM PLATELET PHERESIS
Blood Product Expiration Date: 202211272359
ISSUE DATE / TIME: 202211251705
Unit Type and Rh: 5100

## 2021-09-27 ENCOUNTER — Other Ambulatory Visit: Payer: Self-pay | Admitting: Gastroenterology

## 2021-09-30 ENCOUNTER — Telehealth (INDEPENDENT_AMBULATORY_CARE_PROVIDER_SITE_OTHER): Payer: Medicaid Other | Admitting: Acute Care

## 2021-09-30 ENCOUNTER — Other Ambulatory Visit: Payer: Self-pay

## 2021-09-30 ENCOUNTER — Ambulatory Visit: Payer: Medicaid Other | Admitting: Nurse Practitioner

## 2021-09-30 DIAGNOSIS — F17210 Nicotine dependence, cigarettes, uncomplicated: Secondary | ICD-10-CM

## 2021-09-30 NOTE — Progress Notes (Signed)
Virtual Visit via Telephone Note  I connected with Barbara Stanley on 08/31/21 at  2:00 PM EST by telephone and verified that I am speaking with the correct person using two identifiers.  Location: Patient: Home Provider: Working from home   I discussed the limitations, risks, security and privacy concerns of performing an evaluation and management service by telephone and the availability of in person appointments. I also discussed with the patient that there may be a patient responsible charge related to this service. The patient expressed understanding and agreed to proceed.  Shared Decision Making Visit Lung Cancer Screening Program [phone])   Eligibility: Age 50 y.o. Pack Years Smoking History Calculation 26 (# packs/per year x # years smoked) Recent History of coughing up blood  no Unexplained weight loss? no ( >Than 15 pounds within the last 6 months ) Prior History Lung / other cancer no (Diagnosis within the last 5 years already requiring surveillance chest CT Scans). Smoking Status Current Smoker Former Smokers: Years since quit: NA  Quit Date: NA  Visit Components: Discussion included one or more decision making aids. yes Discussion included risk/benefits of screening. yes Discussion included potential follow up diagnostic testing for abnormal scans. yes Discussion included meaning and risk of over diagnosis. yes Discussion included meaning and risk of False Positives. yes Discussion included meaning of total radiation exposure. yes  Counseling Included: Importance of adherence to annual lung cancer LDCT screening. yes Impact of comorbidities on ability to participate in the program. yes Ability and willingness to under diagnostic treatment. yes  Smoking Cessation Counseling: Current Smokers:  Discussed importance of smoking cessation. yes Information about tobacco cessation classes and interventions provided to patient. yes Patient provided with "ticket" for LDCT  Scan. yes Symptomatic Patient. yes  Counseling(Intermediate counseling: > three minutes) [address]406 Diagnosis Code: Tobacco Use Z72.0 Asymptomatic Patient NA  Counseling NA Former Smokers:  Discussed the importance of maintaining cigarette abstinence. yes Diagnosis Code: Personal History of Nicotine Dependence. J00.938 Information about tobacco cessation classes and interventions provided to patient. Yes Patient provided with "ticket" for LDCT Scan. yes Written Order for Lung Cancer Screening with LDCT placed in Epic. Yes (CT Chest Lung Cancer Screening Low Dose W/O CM) [id number] Z12.2-Screening of respiratory organs Z87.891-Personal history of nicotine dependence   I spent 25 minutes of face to face time with her discussing the risks and benefits of lung cancer screening. We viewed a power point together that explained in detail the above noted topics. We took the time to pause the power point at intervals to allow for questions to be asked and answered to ensure understanding. We discussed that she had taken the single most powerful action possible to decrease her risk of developing lung cancer when she quit smoking. I counseled her to remain smoke free, and to contact me if she ever had the desire to smoke again so that I can provide resources and tools to help support the effort to remain smoke free. We discussed the time and location of the scan, and that either  Doroteo Glassman RN or I will call with the results within  24-48 hours of receiving them. She has my card and contact information in the event she needs to speak with me, in addition to a copy of the power point we reviewed as a resource. She verbalized understanding of all of the above and had no further questions upon leaving the office.     I explained to the patient that there has been a high  incidence of coronary artery disease noted on these exams. I explained that this is a non-gated exam therefore degree or severity cannot be  determined. This patient is not on statin therapy. I have asked the patient to follow-up with their PCP regarding any incidental finding of coronary artery disease and management with diet or medication as they feel is clinically indicated. The patient verbalized understanding of the above and had no further questions.  Mao Lockner D. Kenton Kingfisher, NP-C [facility] Personal contact information can be found on Amion  09/30/2021, 9:52 AM

## 2021-09-30 NOTE — Patient Instructions (Signed)
Thank you for participating in the [facility] Screening [facility]. °It was our pleasure to meet you today. °We will call you with the results of your scan within the next few days. °Your scan will be assigned a Lung RADS category score by the physicians reading the scans.  °This Lung RADS score determines follow up scanning.  °See below for description of categories, and follow up screening recommendations. °We will be in touch to schedule your follow up screening annually or based on recommendations of our providers. °We will fax a copy of your scan results to your Primary Care Physician, or the physician who referred you to the program, to ensure they have the results. °Please call the office if you have any questions or concerns regarding your scanning experience or results.  °Our office number is [phone]. °Please speak with Denise Phelps, RN. She is our Lung Cancer Screening RN. °If she is unavailable when you call, please have the office staff send her a message. She will return your call at her earliest convenience. °Remember, if your scan is normal, we will scan you annually as long as you continue to meet the criteria for the program. (Age 55-77, Current smoker or smoker who has quit within the last 15 years). °If you are a smoker, remember, quitting is the single most powerful action that you can take to decrease your risk of lung cancer and other pulmonary, breathing related problems. °We know quitting is hard, and we are here to help.  °Please let us know if there is anything we can do to help you meet your goal of quitting. °If you are a former smoker, congratulations. We are proud of you! Remain smoke free! °Remember you can refer friends or family members through the number above.  °We will screen them to make sure they meet criteria for the program. °Thank you for helping us take better care of you by participating in Lung Screening. ° °You can receive free nicotine replacement therapy  ( patches, gum or mints) by calling 1-800-QUIT NOW. Please call so we can get you on the path to becoming  a non-smoker. I know it is hard, but you can do this! ° °Lung RADS Categories: ° °Lung RADS 1: no nodules or definitely non-concerning nodules.  °Recommendation is for a repeat annual scan in 12 months. ° °Lung RADS 2:  nodules that are non-concerning in appearance and behavior with a very low likelihood of becoming an active cancer. °Recommendation is for a repeat annual scan in 12 months. ° °Lung RADS 3: nodules that are probably non-concerning , includes nodules with a low likelihood of becoming an active cancer.  Recommendation is for a 6-month repeat screening scan. Often noted after an upper respiratory illness. We will be in touch to make sure you have no questions, and to schedule your 6-month scan. ° °Lung RADS 4 A: nodules with concerning findings, recommendation is most often for a follow up scan in 3 months or additional testing based on our provider's assessment of the scan. We will be in touch to make sure you have no questions and to schedule the recommended 3 month follow up scan. ° °Lung RADS 4 B:  indicates findings that are concerning. We will be in touch with you to schedule additional diagnostic testing based on our provider's  assessment of the scan. ° °Hypnosis for smoking cessation  °[employer] °[phone] ° °Acupuncture for smoking cessation  °[facility] °[phone]  °

## 2021-10-01 ENCOUNTER — Ambulatory Visit: Payer: Self-pay | Admitting: Nurse Practitioner

## 2021-10-01 ENCOUNTER — Ambulatory Visit
Admission: RE | Admit: 2021-10-01 | Discharge: 2021-10-01 | Disposition: A | Discharge status: Home / Self Care | Payer: Medicaid Other | Source: Ambulatory Visit | Attending: Acute Care | Admitting: Acute Care

## 2021-10-01 DIAGNOSIS — F17210 Nicotine dependence, cigarettes, uncomplicated: Secondary | ICD-10-CM

## 2021-10-01 DIAGNOSIS — Z87891 Personal history of nicotine dependence: Secondary | ICD-10-CM

## 2021-10-06 ENCOUNTER — Other Ambulatory Visit: Payer: Self-pay | Admitting: Acute Care

## 2021-10-06 DIAGNOSIS — Z87891 Personal history of nicotine dependence: Secondary | ICD-10-CM

## 2021-10-06 DIAGNOSIS — F17210 Nicotine dependence, cigarettes, uncomplicated: Secondary | ICD-10-CM

## 2021-10-08 ENCOUNTER — Other Ambulatory Visit: Payer: Self-pay | Admitting: Nurse Practitioner

## 2021-10-08 DIAGNOSIS — G8929 Other chronic pain: Secondary | ICD-10-CM

## 2021-10-08 DIAGNOSIS — M25562 Pain in left knee: Secondary | ICD-10-CM

## 2021-10-08 NOTE — Progress Notes (Signed)
   [facility] [address] [place], [place]  [address] Phone:  [phone]   Fax:  [phone] 

## 2021-10-08 NOTE — Progress Notes (Signed)
    [facility] [address] [place], [place]  [address] Phone:  [phone]   Fax:  [phone] 

## 2021-10-19 ENCOUNTER — Other Ambulatory Visit: Payer: Self-pay

## 2021-10-20 MED ORDER — TRAMADOL HCL 50 MG PO TABS: 50.0000 mg | ORAL_TABLET | Freq: Every day | ORAL | 0 refills | Status: AC

## 2021-10-22 NOTE — Therapy (Deleted)
OUTPATIENT PHYSICAL THERAPY SHOULDER EVALUATION   Patient Name: Barbara Stanley MRN: [id number] DOB:1971-09-15, 51 y.o., female Today's Date: 10/22/2021    Past Medical History:  Diagnosis Date   Abdominal pain    Anemia    Anxiety    Bronchitis    COPD (chronic obstructive pulmonary disease) ([place])    Depression    bipolar   Diarrhea    Emphysema    GERD (gastroesophageal reflux disease)    History of blood transfusion    MDS (myelodysplastic syndrome) ([place])    Splenomegaly    r/t low platelets   Thrombocytopenia (HCC)    Tobacco abuse    Past Surgical History:  Procedure Laterality Date   ABDOMINAL HYSTERECTOMY     c section x 3      CHOLECYSTECTOMY     GASTRIC BYPASS     UPPER GASTROINTESTINAL ENDOSCOPY     WISDOM TOOTH EXTRACTION     Patient Active Problem List   Diagnosis Date Noted   Malnutrition, unspecified type ([place]) 04/01/2021   Former smoker 02/09/2021   Healthcare maintenance 10/12/2020   Iron deficiency anemia 11/12/2019   Splenomegaly, congestive, chronic 09/05/2019   Chronic ITP (idiopathic thrombocytopenia) (HCC) 08/28/2019   Urinary incontinence, mixed 08/13/2019   MDS (myelodysplastic syndrome), low grade ([place]) 07/26/2019   Vitamin D deficiency 10/08/2018   Vitamin B12 deficiency due to intestinal malabsorption 10/08/2018   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/27/2018   Dietary copper deficiency 09/27/2018   Other pancytopenia ([place]) 09/22/2018   Thrombocytopenia ([place]) 08/08/2018   MDD (major depressive disorder), single episode, severe with psychosis ([place]) 11/05/2014   PTSD (post-traumatic stress disorder) 11/05/2014   Depressive disorder 06/04/2014   Anxiety disorder 06/04/2014   Major depression, recurrent ([place]) 03/21/2014   Suicide attempt ([place]) 03/21/2014   Suicidal ideations 03/21/2014   MDD (major depressive disorder) 03/21/2014   Nonspecific abnormal unspecified cardiovascular function study 09/30/2013   Tobacco use disorder 09/30/2013   COPD  (chronic obstructive pulmonary disease) (HCC)    GERD (gastroesophageal reflux disease)    Anemia     PCP: [name], NP  REFERRING PROVIDER: [name], NP  REFERRING DIAG: ***  THERAPY DIAG:  No diagnosis found.   ONSET DATE: ***  SUBJECTIVE:                                                                                                                                                                                           Barbara Stanley is a 51 y.o. female who presents to clinic with chief complaint of ***.  MOI/History of condition:  ***   Red flags:  {has/denies:26543} {kerredflag:26542}  Pertinent past history:  ***  Pain:  Are you having pain? {yes/no:20286} Pain location: *** NPRS scale:  highest {NUMBERS; 0-10:5044}/10 current {NUMBERS; 0-10:5044}/10  best {NUMBERS; 0-10:5044}/10 Aggravating factors: *** Relieving factors: *** Pain description: {PAIN DESCRIPTION:21022940} Severity: {Desc; low/moderate/high:110033} Irritability: {Desc; low/moderate/high:110033} Stage: {Desc; acute/subacute/chronic:13799} Stability: {kerbetterworse:26715} 24 hour pattern: ***   Occupation: ***  Hobbies/Recreation: ***  Assistive Device: ***  Hand Dominance: ***  Patient Goals ***   PRECAUTIONS: {Therapy precautions:24002}  WEIGHT BEARING RESTRICTIONS {Yes ***/No:24003}  FALLS:  Has patient fallen in last 6 months? {yes/no:20286}, Number of falls: ***  LIVING ENVIRONMENT: Lives with: {[facility] lives with:25569::"lives with their family"} Stairs: {yes/no:20286}; {Stairs:24000}  PLOF: {PLOF:24004}  DIAGNOSTIC FINDINGS:  ***    OBJECTIVE:   GENERAL OBSERVATION:  ***     SENSATION:  Light touch: {intact/deficits:24005}   PALPATION: ***  UPPER EXTREMITY AROM:  ROM Right 10/22/2021 Left 10/22/2021  Shoulder flexion    Shoulder abduction    Shoulder internal rotation    Shoulder external rotation    Functional IR    Functional ER     Shoulder extension    Elbow extension    Elbow flexion     (Blank rows = not tested)  UPPER EXTREMITY MMT:  MMT Right 10/22/2021 Left 10/22/2021  Shoulder flexion    Shoulder abduction (C5)    Shoulder ER    Shoulder IR    Middle trapezius    Lower trapezius    Shoulder extension    Grip strength    Cervical flexion (C1,C2)    Cervical S/B (C3)    Shoulder shrug (C4)    Elbow flexion (C6)    Elbow ext (C7)    Thumb ext (C8)    Finger abd (T1)     (Blank rows = not tested, score listed is out of 5 possible points.  N = WNL, D = diminished, C = clear for gross weakness with myotome testing, * = concordant pain with testing)  UPPER EXTREMITY PROM:  PROM Right 10/22/2021 Left 10/22/2021  Shoulder flexion    Shoulder abduction    Shoulder internal rotation    Shoulder external rotation    Functional IR    Functional ER    Shoulder extension    Elbow extension    Elbow flexion     (Blank rows = not tested)  SHOULDER SPECIAL TESTS:  ***  JOINT MOBILITY TESTING:  ***  PATIENT SURVEYS:  {rehab surveys:24030:a}    TODAY'S TREATMENT:  ***   PATIENT EDUCATION:  POC, diagnosis, prognosis, HEP, and outcome measures.  Pt educated via explanation, demonstration, and handout (HEP).  Pt confirms understanding verbally.    HOME EXERCISE PROGRAM: ***  ASSESSMENT:  CLINICAL IMPRESSION: Barbara Stanley is a 51 y.o. female who presents to clinic with signs and sxs consistent with ***.  Patient presents with pain and impairments/deficits in: ***.  Activity limitations include: ***.  Participation limitations include: ***.  Patient will benefit from skilled therapy to address pain and the listed deficits in order to achieve functional goals, enable safety and independence in completion of daily tasks, and return to PLOF.   REHAB POTENTIAL: {rehabpotential:25112}  CLINICAL DECISION MAKING: {clinical decision making:25114}  EVALUATION COMPLEXITY: {Evaluation  complexity:25115}   GOALS:  SHORT TERM GOALS:  STG Name Target Date Goal status  1 Barbara Stanley will be >75% HEP compliant to improve carryover between sessions and facilitate independent management of condition  Baseline: No HEP 11/12/2021 INITIAL   LONG TERM GOALS:   LTG  Name Target Date Goal status  1 *** 12/17/2021 INITIAL  2 *** 12/17/2021 INITIAL  3 *** 12/17/2021 INITIAL  4 *** 12/17/2021 INITIAL  5 *** 12/17/2021 INITIAL  6 *** 12/17/2021 INITIAL  7 *** 12/17/2021 INITIAL   PLAN: PT FREQUENCY: 1-2x/week  PT DURATION: 8 weeks (Ending 12/17/2021)  PLANNED INTERVENTIONS: Therapeutic exercises, Therapeutic activity, Neuro Muscular re-education, Gait training, Patient/Family education, Joint mobilization, Dry Needling, Electrical stimulation, Spinal mobilization and/or manipulation, Moist heat, Taping, Vasopneumatic device, Ionotophoresis 4mg /ml Dexamethasone, and Manual therapy  PLAN FOR NEXT SESSION: ***   Shearon Balo PT, DPT 10/22/2021, 10:32 AM

## 2021-10-27 ENCOUNTER — Ambulatory Visit: Payer: Medicaid Other | Admitting: Physical Therapy

## 2021-11-01 NOTE — Therapy (Signed)
OUTPATIENT PHYSICAL THERAPY SHOULDER EVALUATION   Patient Name: Barbara Stanley MRN: [id number] DOB:04-Mar-1971, 51 y.o., female Today's Date: 11/02/2021   PT End of Session - 11/02/21 1343     Visit Number 1    Number of Visits 15    Date for PT Re-Evaluation 12/28/21    Authorization Type [place] MCD    PT Start Time 1345    PT Stop Time 1420    PT Time Calculation (min) 35 min    Activity Tolerance Patient tolerated treatment well    Behavior During Therapy WFL for tasks assessed/performed             Past Medical History:  Diagnosis Date   Abdominal pain    Anemia    Anxiety    Bronchitis    COPD (chronic obstructive pulmonary disease) ([place])    Depression    bipolar   Diarrhea    Emphysema    GERD (gastroesophageal reflux disease)    History of blood transfusion    MDS (myelodysplastic syndrome) ([place])    Splenomegaly    r/t low platelets   Thrombocytopenia ([place])    Tobacco abuse    Past Surgical History:  Procedure Laterality Date   ABDOMINAL HYSTERECTOMY     c section x 3      CHOLECYSTECTOMY     GASTRIC BYPASS     UPPER GASTROINTESTINAL ENDOSCOPY     WISDOM TOOTH EXTRACTION     Patient Active Problem List   Diagnosis Date Noted   Malnutrition, unspecified type ([place]) 04/01/2021   Former smoker 02/09/2021   Healthcare maintenance 10/12/2020   Iron deficiency anemia 11/12/2019   Splenomegaly, congestive, chronic 09/05/2019   Chronic ITP (idiopathic thrombocytopenia) (HCC) 08/28/2019   Urinary incontinence, mixed 08/13/2019   MDS (myelodysplastic syndrome), low grade ([place]) 07/26/2019   Vitamin D deficiency 10/08/2018   Vitamin B12 deficiency due to intestinal malabsorption 10/08/2018   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/27/2018   Dietary copper deficiency 09/27/2018   Other pancytopenia ([place]) 09/22/2018   Thrombocytopenia ([place]) 08/08/2018   MDD (major depressive disorder), single episode, severe with psychosis ([place]) 11/05/2014   PTSD (post-traumatic stress  disorder) 11/05/2014   Depressive disorder 06/04/2014   Anxiety disorder 06/04/2014   Major depression, recurrent ([place]) 03/21/2014   Suicide attempt ([place]) 03/21/2014   Suicidal ideations 03/21/2014   MDD (major depressive disorder) 03/21/2014   Nonspecific abnormal unspecified cardiovascular function study 09/30/2013   Tobacco use disorder 09/30/2013   COPD (chronic obstructive pulmonary disease) ([place])    GERD (gastroesophageal reflux disease)    Anemia     PCP: [name], NP  REFERRING PROVIDER: [name], NP  REFERRING DIAG: [phone] (ICD-10-CM) - Chronic right shoulder pain  THERAPY DIAG:  Chronic right shoulder pain  Cervicalgia  Muscle weakness (generalized)   ONSET DATE: Chronic  SUBJECTIVE:  SUBJECTIVE STATEMENT: Pt presents to PT with reports of neck and R shoulder pain. She denies any MOI and notes that pain has been gradually increasing in severity, although not as bad at present. Pt denies any symptoms past her AC joint or down her R UE. Does have some pain in her R upper trap that travels to the R side of her neck. Pt denies b/b changes or saddle anesthesia. Pt is R hand dominant and has had difficulty with reaching into cabinets and lifting objects due to R shoulder pain. Pt also notes that she has increased pain when she lays on her R shoulder while sleeping  PERTINENT HISTORY: Year long hx of neck and R shoulder pain   PAIN:  Are you having pain? Yes NPRS scale: 5/10 (9/10) Pain location: R upper trap, R side of neck, R shoulder PAIN TYPE: sharp and tight Pain description: intermittent  Aggravating factors: driving Relieving factors: medications  PRECAUTIONS: None  WEIGHT BEARING RESTRICTIONS No  FALLS:  Has patient fallen in last 6 months? No Number of  falls: N/A  LIVING ENVIRONMENT: Lives with: lives with their family Lives in: House/apartment Stairs: Yes; N/A Has following equipment at home: None  OCCUPATION: Not currently working  PLOF: Independent and Independent with basic ADLs  PATIENT GOALS: Pt wants to decrease neck and R shoulder pain in order to improve comfort with home ADLs and everyday activity  OBJECTIVE:   DIAGNOSTIC FINDINGS:  N/A  PATIENT SURVEYS:  Quick Dash 43% disability  COGNITION:  Overall cognitive status: Within functional limits for tasks assessed     SENSATION:  Light touch: Appears intact   POSTURE: Medium body habitus, rounded shoulders, fwd head  PALPATION: TTP to R upper trap  UPPER EXTREMITY AROM/PROM:  A/PROM Right 11/02/2021 Left 11/02/2021  Shoulder flexion 140 p! [facility]  Shoulder extension    Shoulder abduction 130 p! [facility]  Shoulder adduction    Shoulder internal rotation T12 p! [facility]  Shoulder external rotation 90 WFL  (Blank rows = not tested)  UPPER EXTREMITY MMT:  MMT Right 11/02/2021 Left 11/02/2021  Shoulder flexion 3+/5 p! 5/5  Shoulder abduction (C5) 3+/5 p! 5/5  Shoulder ER 3+/5 p! 5/5  Shoulder IR 3+/5 p! 5/5   (Blank rows = not tested, score listed is out of 5 possible points.  N = WNL, D = diminished, C = clear for gross weakness with myotome testing, * = concordant pain with testing)   SHOULDER SPECIAL TESTS:  Impingement tests: Hawkins/Kennedy impingement test: positive   Rotator cuff assessment: Drop arm test: positive  and Empty can test: positive    JOINT MOBILITY TESTING:  N/A  TODAY'S TREATMENT:  Therapeutic Exercise: R upper trap stretch x 30" Row x 10 BTB R shoulder IR/ER isometric x 5 - 5"   PATIENT EDUCATION: Education details: eval findings, Quick DASH, HEP, POC Person educated: Patient Education method: Explanation, Demonstration, and Handouts Education comprehension: verbalized understanding and returned demonstration   HOME EXERCISE  PROGRAM: Access Code: [email or web address]  ASSESSMENT:  CLINICAL IMPRESSION: Patient is a 51 y.o. F who was seen today for physical therapy evaluation and treatment for chronic neck and R shoulder pain. Objective impairments include decreased ROM, decreased strength, and pain. These impairments are limiting patient from cleaning, community activity, driving, and meal prep. Personal factors including 1-2 comorbidities: anxiety/depression and COPD  are also affecting patient's functional outcome. Her Quick DASH score indicates moderate disability and shows she is operating well before PLOF for ADLs and other  functional activity. Patient will benefit from skilled PT to address above impairments and improve overall function.  REHAB POTENTIAL: Good  CLINICAL DECISION MAKING: Evolving/moderate complexity  EVALUATION COMPLEXITY: Moderate    GOALS: Goals reviewed with patient? No  SHORT TERM GOALS:  STG Name Target Date Goal status  1 Pt will be compliant and knowledgeable with initial HEP for improved comfort and carryover Baseline: initial HEP given 11/23/2021 INITIAL  2 Pt will self report R neck/shoulder pain no greater than 5/10 for improved comfort and functional ability  Baseline: 9/10 at worst 11/23/2021 INITIAL   LONG TERM GOALS:   LTG Name Target Date Goal status  1 Pt will decrease Quick DASH disability score to no greater than 25% as proxy for functional improvement Basline: 43% disability  12/28/2021 INITIAL  2 Pt will self report R neck/shoulder pain no greater than 3/10 for improved comfort and functional ability Baseline: 9/10 at worst 12/28/2021 INITIAL  3 Pt will improve R shoulder MMT to no less than 4/5 for all tested motions for decreased pain and improved function Baseline:  MMT Right 11/02/2021 Left 11/02/2021  Shoulder flexion 3+/5 p! 5/5  Shoulder abduction (C5) 3+/5 p! 5/5  Shoulder ER 3+/5 p! 5/5  Shoulder IR 3+/5 p! 5/5   12/28/2021 INITIAL   PLAN: PT FREQUENCY:  1x/week  PT DURATION: 3 weeks  PLANNED INTERVENTIONS: Therapeutic exercises, Therapeutic activity, Neuro Muscular re-education, Balance training, Gait training, Patient/Family education, Joint mobilization, Dry Needling, Cryotherapy, Moist heat, and Manual therapy  PLAN FOR NEXT SESSION: assess HEP response, TPDN to R upper trap?; progress periscapular and RTC strengthening as able   Ward Chatters 11/02/2021, 2:22 PM

## 2021-11-02 ENCOUNTER — Ambulatory Visit: Payer: Medicaid Other | Attending: Nurse Practitioner

## 2021-11-02 ENCOUNTER — Other Ambulatory Visit: Payer: Self-pay

## 2021-11-02 DIAGNOSIS — M25511 Pain in right shoulder: Secondary | ICD-10-CM | POA: Diagnosis not present

## 2021-11-02 DIAGNOSIS — M542 Cervicalgia: Secondary | ICD-10-CM

## 2021-11-02 DIAGNOSIS — G8929 Other chronic pain: Secondary | ICD-10-CM

## 2021-11-02 DIAGNOSIS — M6281 Muscle weakness (generalized): Secondary | ICD-10-CM | POA: Diagnosis present

## 2021-11-03 NOTE — Addendum Note (Signed)
Addended by: Ward Chatters on: 11/03/2021 01:59 PM   Modules accepted: Orders

## 2021-11-10 ENCOUNTER — Ambulatory Visit: Payer: Medicaid Other

## 2021-11-10 ENCOUNTER — Other Ambulatory Visit: Payer: Self-pay

## 2021-11-10 DIAGNOSIS — M25511 Pain in right shoulder: Secondary | ICD-10-CM

## 2021-11-10 DIAGNOSIS — M6281 Muscle weakness (generalized): Secondary | ICD-10-CM

## 2021-11-10 DIAGNOSIS — M542 Cervicalgia: Secondary | ICD-10-CM

## 2021-11-10 NOTE — Therapy (Signed)
OUTPATIENT PHYSICAL THERAPY TREATMENT NOTE   Patient Name: Barbara Stanley MRN: [id number] DOB:Jul 18, 1971, 51 y.o., female Today's Date: 11/10/2021  PCP: [name], NP REFERRING PROVIDER: [name], NP   PT End of Session - 11/10/21 1345     Visit Number 2    Number of Visits 15    Date for PT Re-Evaluation 12/28/21    Authorization Type [place] MCD    PT Start Time 1358    PT Stop Time 4782    PT Time Calculation (min) 40 min    Activity Tolerance Patient tolerated treatment well    Behavior During Therapy WFL for tasks assessed/performed             Past Medical History:  Diagnosis Date   Abdominal pain    Anemia    Anxiety    Bronchitis    COPD (chronic obstructive pulmonary disease) ([place])    Depression    bipolar   Diarrhea    Emphysema    GERD (gastroesophageal reflux disease)    History of blood transfusion    MDS (myelodysplastic syndrome) ([place])    Splenomegaly    r/t low platelets   Thrombocytopenia ([place])    Tobacco abuse    Past Surgical History:  Procedure Laterality Date   ABDOMINAL HYSTERECTOMY     c section x 3      CHOLECYSTECTOMY     GASTRIC BYPASS     UPPER GASTROINTESTINAL ENDOSCOPY     WISDOM TOOTH EXTRACTION     Patient Active Problem List   Diagnosis Date Noted   Malnutrition, unspecified type ([place]) 04/01/2021   Former smoker 02/09/2021   Healthcare maintenance 10/12/2020   Iron deficiency anemia 11/12/2019   Splenomegaly, congestive, chronic 09/05/2019   Chronic ITP (idiopathic thrombocytopenia) ([place]) 08/28/2019   Urinary incontinence, mixed 08/13/2019   MDS (myelodysplastic syndrome), low grade ([place]) 07/26/2019   Vitamin D deficiency 10/08/2018   Vitamin B12 deficiency due to intestinal malabsorption 10/08/2018   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/27/2018   Dietary copper deficiency 09/27/2018   Other pancytopenia ([place]) 09/22/2018   Thrombocytopenia ([place]) 08/08/2018   MDD (major depressive disorder), single episode, severe with  psychosis ([place]) 11/05/2014   PTSD (post-traumatic stress disorder) 11/05/2014   Depressive disorder 06/04/2014   Anxiety disorder 06/04/2014   Major depression, recurrent ([place]) 03/21/2014   Suicide attempt ([place]) 03/21/2014   Suicidal ideations 03/21/2014   MDD (major depressive disorder) 03/21/2014   Nonspecific abnormal unspecified cardiovascular function study 09/30/2013   Tobacco use disorder 09/30/2013   COPD (chronic obstructive pulmonary disease) (HCC)    GERD (gastroesophageal reflux disease)    Anemia     REFERRING DIAG: M25.511,G89.29 (ICD-10-CM) - Chronic right shoulder pain  THERAPY DIAG:  Chronic right shoulder pain  Cervicalgia  Muscle weakness (generalized)  PERTINENT HISTORY: Year long hx of neck and R shoulder pain   PRECAUTIONS: None  SUBJECTIVE: Pt presents to PT with continued reports of R shoulder pain and discomfort. She noted pain with HEP and swelling in R shoulder post. She is ready to begin PT treatment at this time.   Pain: Are you having pain? Yes NPRS: 7/10 Pain Location: R upper trap, R side of neck, R shoulder Pain Frequency/Description: intermittent  Aggravating Factors: driving Relieving Factors: medications    OBJECTIVE:   UPPER EXTREMITY AROM/PROM:   A/PROM Right 11/02/2021 Left 11/02/2021  Shoulder flexion 140 p! [facility]  Shoulder extension      Shoulder abduction 130 p! [facility]  Shoulder adduction  Shoulder internal rotation T12 p! [facility]  Shoulder external rotation 90 WFL  (Blank rows = not tested)   UPPER EXTREMITY MMT:   MMT Right 11/02/2021 Left 11/02/2021  Shoulder flexion 3+/5 p! 5/5  Shoulder abduction (C5) 3+/5 p! 5/5  Shoulder ER 3+/5 p! 5/5  Shoulder IR 3+/5 p! 5/5    (Blank rows = not tested, score listed is out of 5 possible points.  N = WNL, D = diminished, C = clear for gross weakness with myotome testing, * = concordant pain with testing)     TODAY'S TREATMENT:  Therapeutic Exercise: UBE lvl 1.0 x 4 min  (58fwd/2bwd) while taking subjective Row 2x8 17# Seated horizontal abd 2x10 RTB Supine cane flex AAROM 2x10  R upper trap stretch x 30" R shoulder IR/ER isometric x 5 - 5" (NT) Manual Therapy: Trigger point release to R upper Positional release to R upper trap     PATIENT EDUCATION: Education details: eval findings, Quick DASH, HEP, POC Person educated: Patient Education method: Explanation, Demonstration, and Handouts Education comprehension: verbalized understanding and returned demonstration     HOME EXERCISE PROGRAM: Access Code: [email or web address]   ASSESSMENT:   CLINICAL IMPRESSION: Pt was able to complete prescribed exercises with no adverse effect. Therapy today focused on improving periscapular strength and decreasing R UE soft tissue irritation. Pt is progressing as expected and will continue to be seen and progressed as tolerated.      GOALS: Goals reviewed with patient? No   SHORT TERM GOALS:   STG Name Target Date Goal status  1 Pt will be compliant and knowledgeable with initial HEP for improved comfort and carryover Baseline: initial HEP given 11/23/2021 INITIAL  2 Pt will self report R neck/shoulder pain no greater than 5/10 for improved comfort and functional ability  Baseline: 9/10 at worst 11/23/2021 INITIAL    LONG TERM GOALS:    LTG Name Target Date Goal status  1 Pt will decrease Quick DASH disability score to no greater than 25% as proxy for functional improvement Basline: 43% disability  12/28/2021 INITIAL  2 Pt will self report R neck/shoulder pain no greater than 3/10 for improved comfort and functional ability Baseline: 9/10 at worst 12/28/2021 INITIAL  3 Pt will improve R shoulder MMT to no less than 4/5 for all tested motions for decreased pain and improved function Baseline:  MMT Right 11/02/2021 Left 11/02/2021  Shoulder flexion 3+/5 p! 5/5  Shoulder abduction (C5) 3+/5 p! 5/5  Shoulder ER 3+/5 p! 5/5  Shoulder IR 3+/5 p! 5/5   12/28/2021 INITIAL     PLAN: PT FREQUENCY: 1x/week   PT DURATION: 3 weeks   PLANNED INTERVENTIONS: Therapeutic exercises, Therapeutic activity, Neuro Muscular re-education, Balance training, Gait training, Patient/Family education, Joint mobilization, Dry Needling, Cryotherapy, Moist heat, and Manual therapy   PLAN FOR NEXT SESSION: assess HEP response, TPDN to R upper trap?; progress periscapular and RTC strengthening as able    Ward Chatters, PT 11/10/2021, 2:44 PM

## 2021-11-17 ENCOUNTER — Ambulatory Visit: Payer: Medicaid Other

## 2021-11-24 ENCOUNTER — Other Ambulatory Visit: Payer: Self-pay

## 2021-11-24 ENCOUNTER — Ambulatory Visit: Payer: Medicaid Other | Attending: Nurse Practitioner

## 2021-11-24 DIAGNOSIS — M6281 Muscle weakness (generalized): Secondary | ICD-10-CM | POA: Diagnosis present

## 2021-11-24 DIAGNOSIS — G8929 Other chronic pain: Secondary | ICD-10-CM | POA: Diagnosis present

## 2021-11-24 DIAGNOSIS — M542 Cervicalgia: Secondary | ICD-10-CM

## 2021-11-24 DIAGNOSIS — M25511 Pain in right shoulder: Secondary | ICD-10-CM | POA: Insufficient documentation

## 2021-11-24 NOTE — Therapy (Signed)
OUTPATIENT PHYSICAL THERAPY TREATMENT NOTE   Patient Name: Barbara Stanley MRN: [id number] DOB:08-03-1971, 51 y.o., female Today's Date: 11/24/2021  PCP: [name], NP REFERRING PROVIDER: [name], NP   PT End of Session - 11/24/21 1358     Visit Number 3    Number of Visits 15    Date for PT Re-Evaluation 12/28/21    Authorization Type [place] MCD    PT Start Time 1400    PT Stop Time 1440    PT Time Calculation (min) 40 min    Activity Tolerance Patient tolerated treatment well    Behavior During Therapy WFL for tasks assessed/performed              Past Medical History:  Diagnosis Date   Abdominal pain    Anemia    Anxiety    Bronchitis    COPD (chronic obstructive pulmonary disease) ([place])    Depression    bipolar   Diarrhea    Emphysema    GERD (gastroesophageal reflux disease)    History of blood transfusion    MDS (myelodysplastic syndrome) ([place])    Splenomegaly    r/t low platelets   Thrombocytopenia ([place])    Tobacco abuse    Past Surgical History:  Procedure Laterality Date   ABDOMINAL HYSTERECTOMY     c section x 3      CHOLECYSTECTOMY     GASTRIC BYPASS     UPPER GASTROINTESTINAL ENDOSCOPY     WISDOM TOOTH EXTRACTION     Patient Active Problem List   Diagnosis Date Noted   Malnutrition, unspecified type ([place]) 04/01/2021   Former smoker 02/09/2021   Healthcare maintenance 10/12/2020   Iron deficiency anemia 11/12/2019   Splenomegaly, congestive, chronic 09/05/2019   Chronic ITP (idiopathic thrombocytopenia) ([place]) 08/28/2019   Urinary incontinence, mixed 08/13/2019   MDS (myelodysplastic syndrome), low grade ([place]) 07/26/2019   Vitamin D deficiency 10/08/2018   Vitamin B12 deficiency due to intestinal malabsorption 10/08/2018   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/27/2018   Dietary copper deficiency 09/27/2018   Other pancytopenia ([place]) 09/22/2018   Thrombocytopenia ([place]) 08/08/2018   MDD (major depressive disorder), single episode, severe with  psychosis ([place]) 11/05/2014   PTSD (post-traumatic stress disorder) 11/05/2014   Depressive disorder 06/04/2014   Anxiety disorder 06/04/2014   Major depression, recurrent ([place]) 03/21/2014   Suicide attempt ([place]) 03/21/2014   Suicidal ideations 03/21/2014   MDD (major depressive disorder) 03/21/2014   Nonspecific abnormal unspecified cardiovascular function study 09/30/2013   Tobacco use disorder 09/30/2013   COPD (chronic obstructive pulmonary disease) ([facility])    GERD (gastroesophageal reflux disease)    Anemia     REFERRING DIAG: M25.511,G89.29 (ICD-10-CM) - Chronic right shoulder pain  THERAPY DIAG:  Chronic right shoulder pain  Cervicalgia  Muscle weakness (generalized)  PERTINENT HISTORY: Year long hx of neck and R shoulder pain   PRECAUTIONS: None  SUBJECTIVE:  Pt presents to PT with reports of decreased shoulder pain, but does have some continued neck pain and discomfort. She states that her shoulder is feeling better and especially when she is compliance with HEP. Pt is ready to begin PT at this time.   Pain: Are you having pain? Yes NPRS: 7/10 Pain Location: R upper trap, R side of neck, R shoulder Pain Frequency/Description: intermittent  Aggravating Factors: driving Relieving Factors: medications    OBJECTIVE:  OUTCOME: QuickDASH: 41% disability   UPPER EXTREMITY AROM/PROM:   A/PROM Right 11/02/2021 Left 11/02/2021  Shoulder flexion 140 p!  [facility]  Shoulder extension      Shoulder abduction 130 p! [facility]  Shoulder adduction      Shoulder internal rotation T12 p! [facility]  Shoulder external rotation 90 WFL  (Blank rows = not tested)   UPPER EXTREMITY MMT:   MMT Right 11/02/2021 Left 11/02/2021  Shoulder flexion 3+/5 p! 5/5  Shoulder abduction (C5) 3+/5 p! 5/5  Shoulder ER 3+/5 p! 5/5  Shoulder IR 3+/5 p! 5/5    (Blank rows = not tested, score listed is out of 5 possible points.  N = WNL, D = diminished, C = clear for gross weakness with myotome testing, * =  concordant pain with testing)     TODAY'S TREATMENT:  Therapeutic Exercise: UBE lvl 1.0 x 4 min (82fd/2bwd) while taking subjective Row 2x12 Black TB Supine horizontal abd 2x10 RTB Seated ER w/ scap retraction 2x10 YTB Supine cane ER AAROM 2x10 - 5" R upper trap stretch x 30" R levator stretch x 30"  Manual Therapy: Trigger point release to R upper Positional release to R upper trap     PATIENT EDUCATION: Education details: eval findings, Quick DASH, HEP, POC Person educated: Patient Education method: Explanation, Demonstration, and Handouts Education comprehension: verbalized understanding and returned demonstration     HOME EXERCISE PROGRAM: Access Code: [email or web address]: [email or web address] Date: 11/24/2021 Prepared by: DOctavio Manns Exercises Seated Upper Trapezius Stretch - 1 x daily - 7 x weekly - 3 reps - 30 sec hold Standing Shoulder Row with Anchored Resistance - 1 x daily - 7 x weekly - 3 sets - 12 reps - 3 sec hold Standing Isometric Shoulder Internal Rotation with Towel Roll at Doorway - 1 x daily - 7 x weekly - 3 sets - 10 reps - 5 sec hold Standing Isometric Shoulder External Rotation with Doorway and Towel Roll - 1 x daily - 7 x weekly - 3 sets - 10 reps - 5 sec hold Shoulder External Rotation and Scapular Retraction with Resistance - 1 x daily - 7 x weekly - 3 sets - 10 reps - yellow theraband hold    ASSESSMENT:   CLINICAL IMPRESSION: Pt was able to complete all prescribed exercises with no adverse effect, although she does continue to fatigue quickly with exercise. Therapy today focused on improving periscapular and RTC muscle strength. Over the course of PT treatment, she has met short term goals relating to pain and HEP. She also has slight improvement in Quick DASH outcome measure, showing some increase in subjective functional ability. Pt does continue to have strength deficits and pain that are limiting her ability to participate in desired  community activities and functional performance with ADLs. She continues to benefit from skilled PT services and should continue to be seen and progress dynamic shoulder stabilizer strength and decreasing neck pain/tightness.      GOALS: Goals reviewed with patient? No   SHORT TERM GOALS:   STG Name Target Date Goal status  1 Pt will be compliant and knowledgeable with initial HEP for improved comfort and carryover Baseline: initial HEP given 11/23/2021 MET  2 Pt will self report R neck/shoulder pain no greater than 5/10 for improved comfort and functional ability  Baseline: 9/10 at worst 11/23/2021 MET    LONG TERM GOALS:    LTG Name Target Date Goal status  1 Pt will decrease Quick DASH disability score to no greater than 25% as proxy for functional improvement Basline: 43% disability  11/24/2021: 41% disability 12/28/2021 ONGOING  2 Pt  will self report R neck/shoulder pain no greater than 3/10 for improved comfort and functional ability Baseline: 9/10 at worst 12/28/2021 ONGOING  3 Pt will improve R shoulder MMT to no less than 4/5 for all tested motions for decreased pain and improved function Baseline:  MMT Right 11/02/2021 Left 11/02/2021  Shoulder flexion 3+/5 p! 5/5  Shoulder abduction (C5) 3+/5 p! 5/5  Shoulder ER 3+/5 p! 5/5  Shoulder IR 3+/5 p! 5/5   12/28/2021 ONGOING    PLAN: PT FREQUENCY: 1x/week   PT DURATION: 8 weeks   PLANNED INTERVENTIONS: Therapeutic exercises, Therapeutic activity, Neuro Muscular re-education, Balance training, Gait training, Patient/Family education, Joint mobilization, Dry Needling, Cryotherapy, Moist heat, and Manual therapy   PLAN FOR NEXT SESSION: assess HEP response; progress periscapular and RTC strengthening as able    Ward Chatters, PT 11/24/2021, 3:34 PM

## 2021-11-29 NOTE — Therapy (Incomplete)
OUTPATIENT PHYSICAL THERAPY TREATMENT NOTE   Patient Name: Barbara Stanley MRN: [id number] DOB:[birth year], 51 y.o., female Today's Date: 11/29/2021  PCP: [name], NP REFERRING PROVIDER: [name], NP      Past Medical History:  Diagnosis Date   Abdominal pain    Anemia    Anxiety    Bronchitis    COPD (chronic obstructive pulmonary disease) ([place])    Depression    bipolar   Diarrhea    Emphysema    GERD (gastroesophageal reflux disease)    History of blood transfusion    MDS (myelodysplastic syndrome) ([place])    Splenomegaly    r/t low platelets   Thrombocytopenia ([place])    Tobacco abuse    Past Surgical History:  Procedure Laterality Date   ABDOMINAL HYSTERECTOMY     c section x 3      CHOLECYSTECTOMY     GASTRIC BYPASS     UPPER GASTROINTESTINAL ENDOSCOPY     WISDOM TOOTH EXTRACTION     Patient Active Problem List   Diagnosis Date Noted   Malnutrition, unspecified type ([place]) 04/01/2021   Former smoker 02/09/2021   Healthcare maintenance 10/12/2020   Iron deficiency anemia 11/12/2019   Splenomegaly, congestive, chronic 09/05/2019   Chronic ITP (idiopathic thrombocytopenia) ([facility]) 08/28/2019   Urinary incontinence, mixed 08/13/2019   MDS (myelodysplastic syndrome), low grade ([place]) 07/26/2019   Vitamin D deficiency 10/08/2018   Vitamin B12 deficiency due to intestinal malabsorption 10/08/2018   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/27/2018   Dietary copper deficiency 09/27/2018   Other pancytopenia ([place]) 09/22/2018   Thrombocytopenia ([place]) 08/08/2018   MDD (major depressive disorder), single episode, severe with psychosis ([place]) 11/05/2014   PTSD (post-traumatic stress disorder) 11/05/2014   Depressive disorder 06/04/2014   Anxiety disorder 06/04/2014   Major depression, recurrent ([place]) 03/21/2014   Suicide attempt ([place]) 03/21/2014   Suicidal ideations 03/21/2014   MDD (major depressive disorder) 03/21/2014   Nonspecific abnormal unspecified cardiovascular  function study 09/30/2013   Tobacco use disorder 09/30/2013   COPD (chronic obstructive pulmonary disease) (H[facility])    GERD (gastroesophageal reflux disease)    Anemia     REFERRING DIAG: M25.511,G89.29 (ICD-10-CM) - Chronic right shoulder pain  THERAPY DIAG:  No diagnosis found.  PERTINENT HISTORY: Year long hx of neck and R shoulder pain   PRECAUTIONS: None  SUBJECTIVE:  ***  Pain: Are you having pain? Yes NPRS: 7/10 Pain Location: R upper trap, R side of neck, R shoulder Pain Frequency/Description: intermittent  Aggravating Factors: driving Relieving Factors: medications    OBJECTIVE:  OUTCOME: QuickDASH: 41% disability   UPPER EXTREMITY AROM/PROM:   A/PROM Right 11/02/2021 Left 11/02/2021  Shoulder flexion 140 p! [facility]  Shoulder extension      Shoulder abduction 130 p! [facility]  Shoulder adduction      Shoulder internal rotation T12 p! [facility]  Shoulder external rotation 90 WFL  (Blank rows = not tested)   UPPER EXTREMITY MMT:   MMT Right 11/02/2021 Left 11/02/2021  Shoulder flexion 3+/5 p! 5/5  Shoulder abduction (C5) 3+/5 p! 5/5  Shoulder ER 3+/5 p! 5/5  Shoulder IR 3+/5 p! 5/5    (Blank rows = not tested, score listed is out of 5 possible points.  N = WNL, D = diminished, C = clear for gross weakness with myotome testing, * = concordant pain with testing)     TODAY'S TREATMENT:  Therapeutic Exercise: UBE lvl 1.0 x 4 min (54fd/2bwd) while taking subjective Row 2x12 Black TB Supine  horizontal abd 2x10 RTB Seated ER w/ scap retraction 2x10 YTB Supine cane ER AAROM 2x10 - 5" R upper trap stretch x 30" R levator stretch x 30"  Manual Therapy: Trigger point release to R upper Positional release to R upper trap     PATIENT EDUCATION: Education details: eval findings, Quick DASH, HEP, POC Person educated: Patient Education method: Explanation, Demonstration, and Handouts Education comprehension: verbalized understanding and returned demonstration     HOME  EXERCISE PROGRAM: Access Code: [email or web address] URL: [email or web address] Date: 11/24/2021 Prepared by: Octavio Manns  Exercises Seated Upper Trapezius Stretch - 1 x daily - 7 x weekly - 3 reps - 30 sec hold Standing Shoulder Row with Anchored Resistance - 1 x daily - 7 x weekly - 3 sets - 12 reps - 3 sec hold Standing Isometric Shoulder Internal Rotation with Towel Roll at Doorway - 1 x daily - 7 x weekly - 3 sets - 10 reps - 5 sec hold Standing Isometric Shoulder External Rotation with Doorway and Towel Roll - 1 x daily - 7 x weekly - 3 sets - 10 reps - 5 sec hold Shoulder External Rotation and Scapular Retraction with Resistance - 1 x daily - 7 x weekly - 3 sets - 10 reps - yellow theraband hold    ASSESSMENT: ***     GOALS: Goals reviewed with patient? No   SHORT TERM GOALS:   STG Name Target Date Goal status  1 Pt will be compliant and knowledgeable with initial HEP for improved comfort and carryover Baseline: initial HEP given 11/23/2021 MET  2 Pt will self report R neck/shoulder pain no greater than 5/10 for improved comfort and functional ability  Baseline: 9/10 at worst 11/23/2021 MET    LONG TERM GOALS:    LTG Name Target Date Goal status  1 Pt will decrease Quick DASH disability score to no greater than 25% as proxy for functional improvement Basline: 43% disability  11/24/2021: 41% disability 12/28/2021 ONGOING  2 Pt will self report R neck/shoulder pain no greater than 3/10 for improved comfort and functional ability Baseline: 9/10 at worst 12/28/2021 ONGOING  3 Pt will improve R shoulder MMT to no less than 4/5 for all tested motions for decreased pain and improved function Baseline:  MMT Right 11/02/2021 Left 11/02/2021  Shoulder flexion 3+/5 p! 5/5  Shoulder abduction (C5) 3+/5 p! 5/5  Shoulder ER 3+/5 p! 5/5  Shoulder IR 3+/5 p! 5/5   12/28/2021 ONGOING    PLAN: PT FREQUENCY: 1x/week   PT DURATION: 8 weeks   PLANNED INTERVENTIONS: Therapeutic  exercises, Therapeutic activity, Neuro Muscular re-education, Balance training, Gait training, Patient/Family education, Joint mobilization, Dry Needling, Cryotherapy, Moist heat, and Manual therapy   PLAN FOR NEXT SESSION: assess HEP response; progress periscapular and RTC strengthening as able    Ward Chatters, PT 11/29/2021, 11:19 AM

## 2021-12-08 ENCOUNTER — Ambulatory Visit: Payer: Medicaid Other

## 2021-12-14 ENCOUNTER — Other Ambulatory Visit: Payer: Self-pay

## 2021-12-14 ENCOUNTER — Ambulatory Visit: Payer: Medicaid Other

## 2021-12-14 DIAGNOSIS — M542 Cervicalgia: Secondary | ICD-10-CM

## 2021-12-14 DIAGNOSIS — G8929 Other chronic pain: Secondary | ICD-10-CM

## 2021-12-14 DIAGNOSIS — M6281 Muscle weakness (generalized): Secondary | ICD-10-CM

## 2021-12-14 DIAGNOSIS — M25511 Pain in right shoulder: Secondary | ICD-10-CM | POA: Diagnosis not present

## 2021-12-14 NOTE — Therapy (Signed)
OUTPATIENT PHYSICAL THERAPY TREATMENT NOTE   Patient Name: Barbara Stanley MRN: [id number] DOB:07/30/1971, 51 y.o., female Today's Date: 12/14/2021  PCP: [name], NP REFERRING PROVIDER: [name], NP   PT End of Session - 12/14/21 1349     Visit Number 4    Number of Visits 15    Date for PT Re-Evaluation 12/28/21    Authorization Type [place] MCD    Authorization Time Period Submitted for 8 PT visits 11/24/2021-01/21/2022    PT Start Time 1355    PT Stop Time 1430    PT Time Calculation (min) 35 min    Activity Tolerance Patient tolerated treatment well    Behavior During Therapy WFL for tasks assessed/performed               Past Medical History:  Diagnosis Date   Abdominal pain    Anemia    Anxiety    Bronchitis    COPD (chronic obstructive pulmonary disease) ([place])    Depression    bipolar   Diarrhea    Emphysema    GERD (gastroesophageal reflux disease)    History of blood transfusion    MDS (myelodysplastic syndrome) ([place])    Splenomegaly    r/t low platelets   Thrombocytopenia ([place])    Tobacco abuse    Past Surgical History:  Procedure Laterality Date   ABDOMINAL HYSTERECTOMY     c section x 3      CHOLECYSTECTOMY     GASTRIC BYPASS     UPPER GASTROINTESTINAL ENDOSCOPY     WISDOM TOOTH EXTRACTION     Patient Active Problem List   Diagnosis Date Noted   Malnutrition, unspecified type ([place]) 04/01/2021   Former smoker 02/09/2021   Healthcare maintenance 10/12/2020   Iron deficiency anemia 11/12/2019   Splenomegaly, congestive, chronic 09/05/2019   Chronic ITP (idiopathic thrombocytopenia) ([place]) 08/28/2019   Urinary incontinence, mixed 08/13/2019   MDS (myelodysplastic syndrome), low grade ([place]) 07/26/2019   Vitamin D deficiency 10/08/2018   Vitamin B12 deficiency due to intestinal malabsorption 10/08/2018   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/27/2018   Dietary copper deficiency 09/27/2018   Other pancytopenia ([place]) 09/22/2018   Thrombocytopenia  ([place]) 08/08/2018   MDD (major depressive disorder), single episode, severe with psychosis ([place]) 11/05/2014   PTSD (post-traumatic stress disorder) 11/05/2014   Depressive disorder 06/04/2014   Anxiety disorder 06/04/2014   Major depression, recurrent ([place]) 03/21/2014   Suicide attempt ([place]) 03/21/2014   Suicidal ideations 03/21/2014   MDD (major depressive disorder) 03/21/2014   Nonspecific abnormal unspecified cardiovascular function study 09/30/2013   Tobacco use disorder 09/30/2013   COPD (chronic obstructive pulmonary disease) (H[facility])    GERD (gastroesophageal reflux disease)    Anemia     REFERRING DIAG: M25.511,G89.29 (ICD-10-CM) - Chronic right shoulder pain  THERAPY DIAG:  Chronic right shoulder pain  Cervicalgia  Muscle weakness (generalized)  PERTINENT HISTORY: Year long hx of neck and R shoulder pain   PRECAUTIONS: None  SUBJECTIVE:  Pt presents to PT with reports of continued neck and R shoulder pain. She had to cancel last session due to severe pain that lingered after previous PT session. She wants to scale down intensity of exercises as she has also had onset of L shoulder pain with some of the exercises in her HEP.   Pain: Are you having pain? Yes NPRS: 2/10 Pain Location: neck, bilateral upper traps Pain Frequency/Description: intermittent  Aggravating Factors: driving Relieving Factors: medications    OBJECTIVE:  OUTCOME: QuickDASH:  41% disability   UPPER EXTREMITY AROM/PROM:   A/PROM Right 11/02/2021 Left 11/02/2021  Shoulder flexion 140 p! [facility]  Shoulder extension      Shoulder abduction 130 p! [facility]  Shoulder adduction      Shoulder internal rotation T12 p! [facility]  Shoulder external rotation 90 WFL  (Blank rows = not tested)   UPPER EXTREMITY MMT:   MMT Right 11/02/2021 Left 11/02/2021  Shoulder flexion 3+/5 p! 5/5  Shoulder abduction (C5) 3+/5 p! 5/5  Shoulder ER 3+/5 p! 5/5  Shoulder IR 3+/5 p! 5/5    (Blank rows = not tested, score  listed is out of 5 possible points.  N = WNL, D = diminished, C = clear for gross weakness with myotome testing, * = concordant pain with testing)     TODAY'S TREATMENT:  Therapeutic Exercise: Row 3x12 GTB Seated scapular retraction x 10  Manual Therapy: Trigger point release to bilateral upper trap Positional release to bilateral upper trap Manual cervical traction     PATIENT EDUCATION: Education details: new HEP Person educated: Patient Education method: Explanation, Demonstration, and Handouts Education comprehension: verbalized understanding and returned demonstration     HOME EXERCISE PROGRAM: Access Code: [email or web address] URL: [email or web address] Date: 12/14/2021 Prepared by: Octavio Manns  Exercises Seated Upper Trapezius Stretch - 1 x daily - 7 x weekly - 3 reps - 30 sec hold Seated Levator Scapulae Stretch - 1 x daily - 7 x weekly - 3 reps - 30 sec hold Standing Shoulder Row with Anchored Resistance - 1 x daily - 7 x weekly - 3 sets - 12 reps - 3 sec hold Seated Scapular Retraction - 1 x daily - 7 x weekly - 2 sets - 10 reps - 3 sec hold Standing Isometric Shoulder Internal Rotation with Towel Roll at Doorway - 1 x daily - 7 x weekly - 3 sets - 10 reps - 5 sec hold Standing Isometric Shoulder External Rotation with Doorway and Towel Roll - 1 x daily - 7 x weekly - 3 sets - 10 reps - 5 sec hold     ASSESSMENT: Pt responded well to PT interventions today, as exercises were scaled back due to increased pain after last session and pt apprehension. She once again responded well to manual therapy interventions, noting decrease in tightness post session. Pt will continue to be seen per POC as prescribed, with strengthening exercises scaled back over the next few visits to reduce pain. Will continue to assess response to intervention and progress accordingly.     GOALS: Goals reviewed with patient? No   SHORT TERM GOALS:   STG Name Target Date Goal status  1 Pt  will be compliant and knowledgeable with initial HEP for improved comfort and carryover Baseline: initial HEP given 11/23/2021 MET  2 Pt will self report R neck/shoulder pain no greater than 5/10 for improved comfort and functional ability  Baseline: 9/10 at worst 11/23/2021 MET    LONG TERM GOALS:    LTG Name Target Date Goal status  1 Pt will decrease Quick DASH disability score to no greater than 25% as proxy for functional improvement Basline: 43% disability  11/24/2021: 41% disability 12/28/2021 ONGOING  2 Pt will self report R neck/shoulder pain no greater than 3/10 for improved comfort and functional ability Baseline: 9/10 at worst 12/28/2021 ONGOING  3 Pt will improve R shoulder MMT to no less than 4/5 for all tested motions for decreased pain and improved function Baseline:  MMT Right 11/02/2021 Left  11/02/2021  Shoulder flexion 3+/5 p! 5/5  Shoulder abduction (C5) 3+/5 p! 5/5  Shoulder ER 3+/5 p! 5/5  Shoulder IR 3+/5 p! 5/5   12/28/2021 ONGOING    PLAN: PT FREQUENCY: 1x/week   PT DURATION: 8 weeks   PLANNED INTERVENTIONS: Therapeutic exercises, Therapeutic activity, Neuro Muscular re-education, Balance training, Gait training, Patient/Family education, Joint mobilization, Dry Needling, Cryotherapy, Moist heat, and Manual therapy   PLAN FOR NEXT SESSION: assess HEP response; progress periscapular and RTC strengthening as able    Ward Chatters, PT 12/14/2021, 2:35 PM

## 2021-12-17 ENCOUNTER — Inpatient Hospital Stay (HOSPITAL_BASED_OUTPATIENT_CLINIC_OR_DEPARTMENT_OTHER)
Admission: EM | Admit: 2021-12-17 | Discharge: 2021-12-20 | DRG: 190 | Disposition: A | Discharge status: Home / Self Care | Payer: Medicaid Other | Attending: Internal Medicine | Admitting: Internal Medicine

## 2021-12-17 ENCOUNTER — Other Ambulatory Visit: Payer: Self-pay

## 2021-12-17 ENCOUNTER — Emergency Department (HOSPITAL_BASED_OUTPATIENT_CLINIC_OR_DEPARTMENT_OTHER): Payer: Medicaid Other

## 2021-12-17 ENCOUNTER — Ambulatory Visit
Admission: EM | Admit: 2021-12-17 | Discharge: 2021-12-17 | Disposition: A | Discharge status: Home / Self Care | Payer: Medicaid Other

## 2021-12-17 ENCOUNTER — Encounter (HOSPITAL_BASED_OUTPATIENT_CLINIC_OR_DEPARTMENT_OTHER): Payer: Self-pay | Admitting: *Deleted

## 2021-12-17 DIAGNOSIS — B349 Viral infection, unspecified: Secondary | ICD-10-CM

## 2021-12-17 DIAGNOSIS — Z833 Family history of diabetes mellitus: Secondary | ICD-10-CM

## 2021-12-17 DIAGNOSIS — J441 Chronic obstructive pulmonary disease with (acute) exacerbation: Secondary | ICD-10-CM | POA: Diagnosis not present

## 2021-12-17 DIAGNOSIS — D696 Thrombocytopenia, unspecified: Secondary | ICD-10-CM | POA: Diagnosis present

## 2021-12-17 DIAGNOSIS — E872 Acidosis, unspecified: Secondary | ICD-10-CM | POA: Diagnosis not present

## 2021-12-17 DIAGNOSIS — E669 Obesity, unspecified: Secondary | ICD-10-CM | POA: Diagnosis present

## 2021-12-17 DIAGNOSIS — D61818 Other pancytopenia: Secondary | ICD-10-CM | POA: Diagnosis present

## 2021-12-17 DIAGNOSIS — J9601 Acute respiratory failure with hypoxia: Secondary | ICD-10-CM | POA: Diagnosis not present

## 2021-12-17 DIAGNOSIS — F17200 Nicotine dependence, unspecified, uncomplicated: Secondary | ICD-10-CM | POA: Diagnosis present

## 2021-12-17 DIAGNOSIS — J9811 Atelectasis: Secondary | ICD-10-CM | POA: Diagnosis not present

## 2021-12-17 DIAGNOSIS — R0902 Hypoxemia: Secondary | ICD-10-CM

## 2021-12-17 DIAGNOSIS — Z79899 Other long term (current) drug therapy: Secondary | ICD-10-CM

## 2021-12-17 DIAGNOSIS — Z8249 Family history of ischemic heart disease and other diseases of the circulatory system: Secondary | ICD-10-CM

## 2021-12-17 DIAGNOSIS — F32A Depression, unspecified: Secondary | ICD-10-CM | POA: Diagnosis not present

## 2021-12-17 DIAGNOSIS — F17210 Nicotine dependence, cigarettes, uncomplicated: Secondary | ICD-10-CM | POA: Diagnosis present

## 2021-12-17 DIAGNOSIS — Z9884 Bariatric surgery status: Secondary | ICD-10-CM | POA: Diagnosis not present

## 2021-12-17 DIAGNOSIS — Z6838 Body mass index (BMI) 38.0-38.9, adult: Secondary | ICD-10-CM | POA: Diagnosis not present

## 2021-12-17 DIAGNOSIS — D4620 Refractory anemia with excess of blasts, unspecified: Secondary | ICD-10-CM | POA: Diagnosis present

## 2021-12-17 DIAGNOSIS — D469 Myelodysplastic syndrome, unspecified: Secondary | ICD-10-CM | POA: Diagnosis not present

## 2021-12-17 DIAGNOSIS — F419 Anxiety disorder, unspecified: Secondary | ICD-10-CM | POA: Diagnosis not present

## 2021-12-17 DIAGNOSIS — F418 Other specified anxiety disorders: Secondary | ICD-10-CM

## 2021-12-17 DIAGNOSIS — R6510 Systemic inflammatory response syndrome (SIRS) of non-infectious origin without acute organ dysfunction: Secondary | ICD-10-CM

## 2021-12-17 DIAGNOSIS — K219 Gastro-esophageal reflux disease without esophagitis: Secondary | ICD-10-CM | POA: Diagnosis not present

## 2021-12-17 DIAGNOSIS — Z9109 Other allergy status, other than to drugs and biological substances: Secondary | ICD-10-CM | POA: Diagnosis not present

## 2021-12-17 DIAGNOSIS — Z20822 Contact with and (suspected) exposure to covid-19: Secondary | ICD-10-CM | POA: Diagnosis not present

## 2021-12-17 DIAGNOSIS — J449 Chronic obstructive pulmonary disease, unspecified: Secondary | ICD-10-CM | POA: Diagnosis present

## 2021-12-17 LAB — I-STAT VENOUS BLOOD GAS, ED
Acid-Base Excess: 4 mmol/L — ABNORMAL HIGH (ref 0.0–2.0)
Bicarbonate: 27.3 mmol/L (ref 20.0–28.0)
Calcium, Ion: 1.08 mmol/L — ABNORMAL LOW (ref 1.15–1.40)
HCT: 30 % — ABNORMAL LOW (ref 36.0–46.0)
Hemoglobin: 10.2 g/dL — ABNORMAL LOW (ref 12.0–15.0)
O2 Saturation: 95 %
Potassium: 3.4 mmol/L — ABNORMAL LOW (ref 3.5–5.1)
Sodium: 135 mmol/L (ref 135–145)
TCO2: 28 mmol/L (ref 22–32)
pCO2, Ven: 34.6 mmHg — ABNORMAL LOW (ref 44–60)
pH, Ven: 7.506 — ABNORMAL HIGH (ref 7.25–7.43)
pO2, Ven: 68 mmHg — ABNORMAL HIGH (ref 32–45)

## 2021-12-17 LAB — CBC WITH DIFFERENTIAL/PLATELET
Abs Immature Granulocytes: 0.36 10*3/uL — ABNORMAL HIGH (ref 0.00–0.07)
Basophils Absolute: 0.1 10*3/uL (ref 0.0–0.1)
Basophils Relative: 1 %
Eosinophils Absolute: 0 10*3/uL (ref 0.0–0.5)
Eosinophils Relative: 0 %
HCT: 32.7 % — ABNORMAL LOW (ref 36.0–46.0)
Hemoglobin: 10.5 g/dL — ABNORMAL LOW (ref 12.0–15.0)
Immature Granulocytes: 6 %
Lymphocytes Relative: 8 %
Lymphs Abs: 0.5 10*3/uL — ABNORMAL LOW (ref 0.7–4.0)
MCH: 29.9 pg (ref 26.0–34.0)
MCHC: 32.1 g/dL (ref 30.0–36.0)
MCV: 93.2 fL (ref 80.0–100.0)
Monocytes Absolute: 0.7 10*3/uL (ref 0.1–1.0)
Monocytes Relative: 11 %
Neutro Abs: 4.6 10*3/uL (ref 1.7–7.7)
Neutrophils Relative %: 74 %
Platelets: 43 10*3/uL — ABNORMAL LOW (ref 150–400)
RBC: 3.51 MIL/uL — ABNORMAL LOW (ref 3.87–5.11)
RDW: 18.6 % — ABNORMAL HIGH (ref 11.5–15.5)
Smear Review: DECREASED
WBC: 6.2 10*3/uL (ref 4.0–10.5)
nRBC: 0.5 % — ABNORMAL HIGH (ref 0.0–0.2)

## 2021-12-17 LAB — LACTIC ACID, PLASMA
Lactic Acid, Venous: 1.3 mmol/L (ref 0.5–1.9)
Lactic Acid, Venous: 3 mmol/L (ref 0.5–1.9)

## 2021-12-17 LAB — COMPREHENSIVE METABOLIC PANEL
ALT: 11 U/L (ref 0–44)
AST: 14 U/L — ABNORMAL LOW (ref 15–41)
Albumin: 3.3 g/dL — ABNORMAL LOW (ref 3.5–5.0)
Alkaline Phosphatase: 80 U/L (ref 38–126)
Anion gap: 11 (ref 5–15)
BUN: 10 mg/dL (ref 6–20)
CO2: 25 mmol/L (ref 22–32)
Calcium: 8.4 mg/dL — ABNORMAL LOW (ref 8.9–10.3)
Chloride: 98 mmol/L (ref 98–111)
Creatinine, Ser: 0.67 mg/dL (ref 0.44–1.00)
GFR, Estimated: >60 mL/min (ref >60)
Glucose, Bld: 107 mg/dL — ABNORMAL HIGH (ref 70–99)
Potassium: 3.4 mmol/L — ABNORMAL LOW (ref 3.5–5.1)
Sodium: 134 mmol/L — ABNORMAL LOW (ref 135–145)
Total Bilirubin: 0.8 mg/dL (ref 0.3–1.2)
Total Protein: 6.4 g/dL — ABNORMAL LOW (ref 6.5–8.1)

## 2021-12-17 LAB — RESP PANEL BY RT-PCR (FLU A&B, COVID) ARPGX2
Influenza A by PCR: NEGATIVE
Influenza B by PCR: NEGATIVE
SARS Coronavirus 2 by RT PCR: NEGATIVE

## 2021-12-17 LAB — PROTIME-INR
INR: 1.1 (ref 0.8–1.2)
Prothrombin Time: 14.6 seconds (ref 11.4–15.2)

## 2021-12-17 LAB — APTT: aPTT: 31 seconds (ref 24–36)

## 2021-12-17 MED ORDER — ALBUTEROL SULFATE (2.5 MG/3ML) 0.083% IN NEBU
2.5000 mg | INHALATION_SOLUTION | RESPIRATORY_TRACT | Status: DC | PRN
  Administered 2021-12-19 – 2021-12-20 (×2): 2.5 mg via RESPIRATORY_TRACT
  Filled 2021-12-17 (×2): qty 3

## 2021-12-17 MED ORDER — ONDANSETRON HCL 4 MG/2ML IJ SOLN
INTRAMUSCULAR | Status: AC
  Filled 2021-12-17: qty 2

## 2021-12-17 MED ORDER — IPRATROPIUM-ALBUTEROL 0.5-2.5 (3) MG/3ML IN SOLN
3.0000 mL | Freq: Four times a day (QID) | RESPIRATORY_TRACT | Status: DC
  Administered 2021-12-18 (×4): 3 mL via RESPIRATORY_TRACT
  Filled 2021-12-17 (×4): qty 3

## 2021-12-17 MED ORDER — TRAZODONE HCL 100 MG PO TABS
400.0000 mg | ORAL_TABLET | Freq: Every day | ORAL | Status: DC
  Administered 2021-12-18 – 2021-12-19 (×3): 400 mg via ORAL
  Filled 2021-12-17 (×3): qty 4

## 2021-12-17 MED ORDER — VENLAFAXINE HCL ER 75 MG PO CP24
225.0000 mg | ORAL_CAPSULE | Freq: Every day | ORAL | Status: DC
  Administered 2021-12-18 – 2021-12-20 (×3): 225 mg via ORAL
  Filled 2021-12-17 (×3): qty 1

## 2021-12-17 MED ORDER — ELTROMBOPAG OLAMINE 50 MG PO TABS
50.0000 mg | ORAL_TABLET | Freq: Every day | ORAL | Status: DC
  Administered 2021-12-19: 50 mg via ORAL
  Filled 2021-12-17: qty 1

## 2021-12-17 MED ORDER — IPRATROPIUM-ALBUTEROL 0.5-2.5 (3) MG/3ML IN SOLN
3.0000 mL | Freq: Once | RESPIRATORY_TRACT | Status: AC
  Filled 2021-12-17: qty 3

## 2021-12-17 MED ORDER — ONDANSETRON HCL 4 MG/2ML IJ SOLN: 4.0000 mg | Freq: Once | INTRAMUSCULAR | Status: DC

## 2021-12-17 MED ORDER — ALBUTEROL SULFATE (2.5 MG/3ML) 0.083% IN NEBU
10.0000 mg | INHALATION_SOLUTION | RESPIRATORY_TRACT | Status: DC
Start: 2021-12-17 — End: 2021-12-17
  Administered 2021-12-17: 10 mg via RESPIRATORY_TRACT

## 2021-12-17 MED ORDER — ACETAMINOPHEN 325 MG PO TABS
650.0000 mg | ORAL_TABLET | Freq: Four times a day (QID) | ORAL | Status: DC | PRN
  Administered 2021-12-18 – 2021-12-20 (×7): 650 mg via ORAL
  Filled 2021-12-17 (×8): qty 2

## 2021-12-17 MED ORDER — MOMETASONE FURO-FORMOTEROL FUM 200-5 MCG/ACT IN AERO
2.0000 | INHALATION_SPRAY | Freq: Two times a day (BID) | RESPIRATORY_TRACT | Status: DC
Start: 2021-12-18 — End: 2021-12-20
  Administered 2021-12-18 – 2021-12-20 (×5): 2 via RESPIRATORY_TRACT
  Filled 2021-12-17: qty 8.8

## 2021-12-17 MED ORDER — SODIUM CHLORIDE 0.9 % IV SOLN
2.0000 g | INTRAVENOUS | Status: DC
  Administered 2021-12-17: 2 g via INTRAVENOUS
  Filled 2021-12-17: qty 20

## 2021-12-17 MED ORDER — ACETAMINOPHEN 650 MG RE SUPP
650.0000 mg | Freq: Four times a day (QID) | RECTAL | Status: DC | PRN
Start: 2021-12-17 — End: 2021-12-20

## 2021-12-17 MED ORDER — LACTATED RINGERS IV BOLUS (SEPSIS)
800.0000 mL | Freq: Once | INTRAVENOUS | Status: AC
  Administered 2021-12-17: 800 mL via INTRAVENOUS

## 2021-12-17 MED ORDER — ALBUTEROL SULFATE (2.5 MG/3ML) 0.083% IN NEBU
INHALATION_SOLUTION | RESPIRATORY_TRACT | Status: AC
  Filled 2021-12-17: qty 12

## 2021-12-17 MED ORDER — IPRATROPIUM-ALBUTEROL 0.5-2.5 (3) MG/3ML IN SOLN
RESPIRATORY_TRACT | Status: AC
  Administered 2021-12-17: 3 mL via RESPIRATORY_TRACT
  Filled 2021-12-17: qty 3

## 2021-12-17 MED ORDER — METHYLPREDNISOLONE SODIUM SUCC 125 MG IJ SOLR
125.0000 mg | Freq: Once | INTRAMUSCULAR | Status: AC
  Administered 2021-12-17: 125 mg via INTRAVENOUS
  Filled 2021-12-17: qty 2

## 2021-12-17 MED ORDER — FAMOTIDINE 20 MG PO TABS
20.0000 mg | ORAL_TABLET | Freq: Two times a day (BID) | ORAL | Status: DC
  Administered 2021-12-18 – 2021-12-20 (×7): 20 mg via ORAL
  Filled 2021-12-17 (×7): qty 1

## 2021-12-17 MED ORDER — SENNOSIDES-DOCUSATE SODIUM 8.6-50 MG PO TABS: 1.0000 | ORAL_TABLET | Freq: Every evening | ORAL | Status: DC | PRN

## 2021-12-17 MED ORDER — ONDANSETRON HCL 4 MG/2ML IJ SOLN
4.0000 mg | Freq: Once | INTRAMUSCULAR | Status: AC
  Administered 2021-12-17: 4 mg via INTRAVENOUS

## 2021-12-17 MED ORDER — ONDANSETRON HCL 4 MG PO TABS
4.0000 mg | ORAL_TABLET | Freq: Four times a day (QID) | ORAL | Status: DC | PRN
  Filled 2021-12-17: qty 1

## 2021-12-17 MED ORDER — SODIUM CHLORIDE 0.9 % IV SOLN
500.0000 mg | INTRAVENOUS | Status: DC
  Administered 2021-12-17: 500 mg via INTRAVENOUS
  Filled 2021-12-17: qty 5

## 2021-12-17 MED ORDER — ALBUTEROL SULFATE (2.5 MG/3ML) 0.083% IN NEBU
2.5000 mg | INHALATION_SOLUTION | Freq: Once | RESPIRATORY_TRACT | Status: AC
Start: 2021-12-17 — End: 2021-12-17
  Filled 2021-12-17: qty 3

## 2021-12-17 MED ORDER — AZITHROMYCIN 250 MG PO TABS
500.0000 mg | ORAL_TABLET | Freq: Every day | ORAL | Status: DC
  Administered 2021-12-18 – 2021-12-20 (×3): 500 mg via ORAL
  Filled 2021-12-17 (×3): qty 2

## 2021-12-17 MED ORDER — ONDANSETRON HCL 4 MG/2ML IJ SOLN: 4.0000 mg | Freq: Four times a day (QID) | INTRAMUSCULAR | Status: DC | PRN

## 2021-12-17 MED ORDER — ALBUTEROL (5 MG/ML) CONTINUOUS INHALATION SOLN
5.0000 mg/h | INHALATION_SOLUTION | Freq: Once | RESPIRATORY_TRACT | Status: DC
Start: 2021-12-17 — End: 2021-12-17

## 2021-12-17 MED ORDER — FOLIC ACID 1 MG PO TABS
1.0000 mg | ORAL_TABLET | Freq: Every day | ORAL | Status: DC
  Administered 2021-12-18 – 2021-12-20 (×3): 1 mg via ORAL
  Filled 2021-12-17 (×3): qty 1

## 2021-12-17 MED ORDER — BUSPIRONE HCL 5 MG PO TABS
5.0000 mg | ORAL_TABLET | Freq: Three times a day (TID) | ORAL | Status: DC
  Administered 2021-12-18 – 2021-12-20 (×6): 5 mg via ORAL
  Filled 2021-12-17 (×8): qty 1

## 2021-12-17 MED ORDER — ALBUTEROL SULFATE (2.5 MG/3ML) 0.083% IN NEBU
INHALATION_SOLUTION | RESPIRATORY_TRACT | Status: AC
  Administered 2021-12-17: 2.5 mg via RESPIRATORY_TRACT
  Filled 2021-12-17: qty 3

## 2021-12-17 MED ORDER — LACTATED RINGERS IV BOLUS (SEPSIS)
1000.0000 mL | Freq: Once | INTRAVENOUS | Status: DC
  Administered 2021-12-17: 1000 mL via INTRAVENOUS

## 2021-12-17 MED ORDER — LACTATED RINGERS IV SOLN: INTRAVENOUS | Status: DC

## 2021-12-17 MED ORDER — METHYLPREDNISOLONE SODIUM SUCC 40 MG IJ SOLR
40.0000 mg | Freq: Two times a day (BID) | INTRAMUSCULAR | Status: DC
  Administered 2021-12-18 – 2021-12-19 (×3): 40 mg via INTRAVENOUS
  Filled 2021-12-17 (×3): qty 1

## 2021-12-17 NOTE — ED Notes (Signed)
Patient updated on plan of care

## 2021-12-17 NOTE — ED Notes (Signed)
Report given to [facility] at this time 

## 2021-12-17 NOTE — ED Triage Notes (Signed)
She was seen at [facility] today for sob, cough and runny nose. She was told to come to the ER. Tylenol 4 hours ago.  ?

## 2021-12-17 NOTE — ED Notes (Signed)
Blood Cultures x 2 obtained prior to abx administration ?BC #1 Left Hand ?BC #2 Left AC ?

## 2021-12-17 NOTE — Assessment & Plan Note (Addendum)
Smoke about 5 cigarettes/day, down from 2 packs/day.  Patient declined nicotine patch. ?Continue smoking cessation counseling.  ?

## 2021-12-17 NOTE — ED Triage Notes (Signed)
Onset yesterday of cough, congestion and runny nose. ?Pt c/o "sharp" pain with taking deep breaths and lying down. Has been taking tylenol and nyquil without relief. Also using daughters inhaler with relief initially. ?In house with family members with the same sxs. ?

## 2021-12-17 NOTE — ED Notes (Signed)
TOTAL IVF BOLUS REC 1.2L, ORDER REC TO DC FLUID BOLUSES, LR NOW INFUSING AT 20ML/HR ?

## 2021-12-17 NOTE — Plan of Care (Signed)
?  Problem: Education: Goal: Knowledge of General Education information will improve Description: Including pain rating scale, medication(s)/side effects and non-pharmacologic comfort measures Outcome: Progressing   Problem: Clinical Measurements: Goal: Respiratory complications will improve Outcome: Progressing   Problem: Activity: Goal: Risk for activity intolerance will decrease Outcome: Progressing   Problem: Pain Managment: Goal: General experience of comfort will improve Outcome: Progressing   Problem: Safety: Goal: Ability to remain free from injury will improve Outcome: Progressing   

## 2021-12-17 NOTE — Assessment & Plan Note (Addendum)
Pancytopenia.  ?Follows with hematology/oncology Dr. Lynn Ito with [facility]. ?-Continue Promacta ?  ?Her cell count remained stable, at the time of her discharge wbc was 3,1 hgb 9,5, hct 30,5 and plt 43. ?Plan to follow up as outpatient.  ?

## 2021-12-17 NOTE — H&P (Signed)
History and Physical    Barbara Stanley KPV:[id number] DOB: [birth year] DOA: 12/17/2021  PCP: [name], NP  Patient coming from: [facility]  I have personally briefly reviewed patient's old medical records in [place]  Chief Complaint: Dyspnea  HPI: Barbara Stanley is a 51 y.o. female with medical history significant for COPD, myelodysplastic syndrome with chronic thrombocytopenia, depression/anxiety, and tobacco use who presented to the ED for evaluation of dyspnea.  Patient reports 2 days of worsening shortness of breath and new nonproductive cough.  She has pleuritic chest discomfort with deep inspiration.  She has had chills but denies any subjective fevers or diaphoresis.  She says she no longer has her rescue inhaler or albuterol nebulizer as these were apparently not approved by her insurance.  Her symptoms subsequently worsened and became severe today and she went to the ED for further management.  Patient does admit to continued tobacco use, she is down to about 5 cigarettes daily from 2 packs/day.  ED Course   Labs/Imaging on admission: I have personally reviewed following labs and imaging studies.  Initial vitals showed BP 130/83, pulse 116, RR 20, temp 99.9 F, SPO2 91% on room air.  Patient subsequently desaturated to 85% on room air and was placed on 2 L O2 via [place] with improvement.  Labs showed WBC 6.2, hemoglobin 10.5, platelets 43,000, sodium 134, potassium 3.4, bicarb 25, BUN 10, creatinine 0.67, serum glucose 107, AST 14, ALT 11, alk phos 80, total bilirubin 0.8, lactic acid 1.3.  SARS-CoV-2 and influenza PCR negative.  Blood cultures in process.  VBG showed pH 7.506, PCO2 34.6, PO2 68.  Portable chest x-ray was negative for focal consolidation, edema, effusion.  Patient was given IV Solu-Medrol 125 mg, 800 mL of LR, albuterol and DuoNeb treatment, IV ceftriaxone and azithromycin.  The hospitalist service was consulted to admit for further  evaluation and management.  Review of Systems: All systems reviewed and are negative except as documented in history of present illness above.   Past Medical History:  Diagnosis Date   Abdominal pain    Anemia    Anxiety    Bronchitis    COPD (chronic obstructive pulmonary disease) (HCC)    Depression    bipolar   Diarrhea    Emphysema    GERD (gastroesophageal reflux disease)    History of blood transfusion    MDS (myelodysplastic syndrome) (HCC)    Splenomegaly    r/t low platelets   Thrombocytopenia (HCC)    Tobacco abuse     Past Surgical History:  Procedure Laterality Date   ABDOMINAL HYSTERECTOMY     c section x 3      CHOLECYSTECTOMY     GASTRIC BYPASS     UPPER GASTROINTESTINAL ENDOSCOPY     WISDOM TOOTH EXTRACTION      Social History:  reports that she has been smoking cigarettes. She has a 15.00 pack-year smoking history. She has never used smokeless tobacco. She reports that she does not currently use alcohol. She reports that she does not use drugs.  Allergies  Allergen Reactions   Other     "Banana Bag" given to patient at physician's office, severe back pain, SOB    Family History  Problem Relation Age of Onset   Diabetes Mother    Diabetes Other    Hypertension Other    CAD Other    Heart disease Father    Colon cancer Neg Hx    Rectal  cancer Neg Hx    Stomach cancer Neg Hx    Esophageal cancer Neg Hx      Prior to Admission medications   Medication Sig Start Date End Date Taking? Authorizing Provider  Accu-Chek Softclix Lancets lancets USE AS DIRECTED USE TWO TIMES A DAY Patient taking differently: 1 each 2 (two) times daily. 01/08/21   [name], NP  acetaminophen (TYLENOL) 500 MG tablet Take 1,000 mg by mouth daily as needed for moderate pain.    [provider]  albuterol (PROVENTIL) (2.5 MG/3ML) 0.083% nebulizer solution Take 3 mLs (2.5 mg total) by nebulization every 4 (four) hours as needed for wheezing or shortness of  breath. 05/30/19   Icard, Octavio Graves, DO  albuterol (VENTOLIN HFA) 108 (90 Base) MCG/ACT inhaler Inhale 1-2 puffs into the lungs every 6 (six) hours as needed for wheezing or shortness of breath. Patient not taking: Reported on 09/10/2021 02/09/21   Martyn Ehrich, NP  busPIRone (BUSPAR) 5 MG tablet TAKE 1 TABLET(5 MG) BY MOUTH THREE TIMES DAILY Patient taking differently: Take 5 mg by mouth 3 (three) times daily. 08/17/20   [name], NP  cholecalciferol (VITAMIN D3) 25 MCG (1000 UT) tablet Take 5,000 Units by mouth daily.    [provider]  colestipol (COLESTID) 1 g tablet TAKE 2 TABLETS(2 GRAMS) BY MOUTH TWICE DAILY 09/27/21   Thornton Park, MD  Copper Gluconate 2 MG CAPS Take 4 mg by mouth daily.    [provider]  diclofenac sodium (VOLTAREN) 1 % GEL Apply 2 g topically 4 (four) times daily. 07/24/19   Azzie Glatter, FNP  dicyclomine (BENTYL) 20 MG tablet Take 1 tablet (20 mg total) by mouth in the morning, at noon, in the evening, and at bedtime. 02/24/21   Thornton Park, MD  eltrombopag (PROMACTA) 50 MG tablet Take 50 mg by mouth daily. 06/30/20   [provider]  esomeprazole (NEXIUM) 40 MG capsule Take 1 capsule (40 mg total) by mouth 2 (two) times daily. 02/24/21   Thornton Park, MD  famotidine (PEPCID) 20 MG tablet Take 1 tablet (20 mg total) by mouth 2 (two) times daily. 02/24/21   Thornton Park, MD  fluticasone-salmeterol (ADVAIR HFA) [phone] MCG/ACT inhaler Inhale 2 puffs into the lungs 2 (two) times daily. 02/09/21   Martyn Ehrich, NP  folic acid (FOLVITE) 1 MG tablet TAKE 1 TABLET(1 MG) BY MOUTH DAILY Patient taking differently: Take 1 mg by mouth daily. 03/26/20   Heath Lark, MD  meclizine (ANTIVERT) 25 MG tablet Take 1 tablet (25 mg total) by mouth 3 (three) times daily as needed for dizziness. May cause drowsiness, avoid use with phenergan 05/19/21   [name], NP  nicotine (NICODERM CQ) 21 mg/24hr patch Place 1 patch (21 mg  total) onto the skin daily. 10/12/20   Lauraine Rinne, NP  nicotine polacrilex (COMMIT) 4 MG lozenge Take 1 lozenge (4 mg total) by mouth as needed for smoking cessation. 10/12/20   Lauraine Rinne, NP  ondansetron (ZOFRAN ODT) 4 MG disintegrating tablet Take 1-2 tablets (4-8 mg total) by mouth every 8 (eight) hours as needed for nausea or vomiting. 04/20/21   Wieters, Hallie C, PA-C  promethazine (PHENERGAN) 25 MG tablet Take 1 tablet (25 mg total) by mouth every 6 (six) hours as needed for refractory nausea / vomiting. 04/20/21   Wieters, Hallie C, PA-C  solifenacin (VESICARE) 10 MG tablet Take 1 tablet (10 mg total) by mouth daily. 04/06/21  Chancy Milroy, MD  sulfamethoxazole-trimethoprim (BACTRIM DS) 800-160 MG tablet Take 1 tablet by mouth See admin instructions. Monday, Wednesday, Friday    [provider]  Tiotropium Bromide Monohydrate (SPIRIVA RESPIMAT) 2.5 MCG/ACT AERS Inhale 2 puffs into the lungs daily. 02/09/21   Martyn Ehrich, NP  traZODone (DESYREL) 150 MG tablet Take 1 tablet (150 mg total) by mouth at bedtime. For sleep Patient taking differently: Take 400 mg by mouth at bedtime. For sleep 11/08/14   Rankin, Shuvon B, NP  venlafaxine XR (EFFEXOR-XR) 75 MG 24 hr capsule Take 1 capsule (75 mg total) by mouth daily with breakfast. For depression 11/08/14   Rankin, Shuvon B, NP    Physical Exam: Vitals:   12/17/21 2007 12/17/21 2100 12/17/21 2130 12/17/21 2220  BP:  131/83 124/75 125/79  Pulse: (!) 116 (!) 111 (!) 105 (!) 101  Resp: (!) '22 16 17 17  ' Temp:    98.3 F (36.8 C)  TempSrc:    Oral  SpO2: 94% 92% 95% 96%  Weight:      Height:       Constitutional: Resting in bed, NAD, calm, comfortable Eyes: PERRL, lids and conjunctivae normal ENMT: Mucous membranes are moist. Posterior pharynx clear of any exudate or lesions.Normal dentition.  Neck: normal, supple, no masses. Respiratory: Distant breath sounds without significant expiratory wheezing. Normal respiratory  effort while on 2 L O2 via [place]. No accessory muscle use.  Cardiovascular: Regular rate and rhythm, no murmurs / rubs / gallops. No extremity edema. 2+ pedal pulses. Abdomen: no tenderness, no masses palpated. No hepatosplenomegaly. Bowel sounds positive.  Musculoskeletal: no clubbing / cyanosis. No joint deformity upper and lower extremities. Good ROM, no contractures. Normal muscle tone.  Skin: no rashes, lesions, ulcers. No induration Neurologic: CN 2-12 grossly intact. Sensation intact. Strength 5/5 in all 4.  Psychiatric: Normal judgment and insight. Alert and oriented x 3. Normal mood.   EKG: Personally reviewed. Sinus tachycardia, rate 113, no acute ischemic changes.  Rate is faster when compared to prior.  Assessment/Plan Principal Problem:   Acute exacerbation of chronic obstructive pulmonary disease (COPD) (HCC) Active Problems:   MDS (myelodysplastic syndrome), low grade (HCC)   Thrombocytopenia (HCC)   Tobacco use disorder   Depression with anxiety   Waynette Towers is a 51 y.o. female with medical history significant for COPD, myelodysplastic syndrome with chronic thrombocytopenia, depression/anxiety, and tobacco use who is admitted with acute COPD exacerbation.  Assessment and Plan: * Acute exacerbation of chronic obstructive pulmonary disease (COPD) ([place]) Symptoms improving with initial management in ED.  Not much wheezing at time of admission.  CXR negative for evidence of pneumonia. -Continue scheduled DuoNebs with albuterol as needed -Continue IV Solu-Medrol 40 mg twice daily -Discontinue ceftriaxone -Switch azithromycin to oral -Continue supplemental oxygen as needed, wean off as able  MDS (myelodysplastic syndrome), low grade (HCC) Stable.  Follows with hematology/oncology Dr. Lynn Ito with [place]  Thrombocytopenia [facility]) Secondary to MDS.  Stable without obvious bleeding.  Continue to monitor.  Depression with  anxiety Continue home Effexor XR, BuSpar, trazodone.  Tobacco use disorder Smoke about 5 cigarettes/day, down from 2 packs/day.  Patient declines nicotine patch.  DVT prophylaxis: SCDs Start: 12/17/21 2323 Code Status: Full code, confirmed with patient Family Communication: Discussed with patient, she has discussed with family. Disposition Plan: From home and likely discharge to home pending clinical progress. Consults called: None Severity of Illness: The appropriate patient status for this patient is  OBSERVATION. Observation status is judged to be reasonable and necessary in order to provide the required intensity of service to ensure the patient's safety. The patient's presenting symptoms, physical exam findings, and initial radiographic and laboratory data in the context of their medical condition is felt to place them at decreased risk for further clinical deterioration. Furthermore, it is anticipated that the patient will be medically stable for discharge from the hospital within 2 midnights of admission.   Zada Finders MD [facility]  If 7PM-7AM, please contact night-coverage [email or web address]  12/17/2021, 11:46 PM

## 2021-12-17 NOTE — Progress Notes (Signed)
[facility] arrived with pt via stretcher, VS stable, notified the [facility] admits. ?

## 2021-12-17 NOTE — Assessment & Plan Note (Addendum)
Discharge plt 43, follow up as outpatient.  ?

## 2021-12-17 NOTE — ED Provider Notes (Signed)
?  Physical Exam  ?BP 122/70   Pulse (!) 109   Temp 99 ?F (37.2 ?C) (Oral)   Resp (!) 21   Ht _0  (1.676 m)   Wt 108 kg   LMP  (LMP Unknown)   SpO2 92%   BMI 38.43 kg/m?  ? ?Physical Exam ?Vitals and nursing note reviewed.  ?Constitutional:   ?   Appearance: She is ill-appearing. She is not toxic-appearing.  ?Cardiovascular:  ?   Rate and Rhythm: Tachycardia present.  ?Pulmonary:  ?   Effort: Tachypnea present. No accessory muscle usage.  ?   Breath sounds: Decreased breath sounds present.  ?   Comments: Diffuse expiratory wheezing. ?Neurological:  ?   Mental Status: She is alert.  ? ? ?Procedures  ?Procedures ? ?ED Course / MDM  ?  ?Medical Decision Making ?Amount and/or Complexity of Data Reviewed ?Labs: ordered. ?Radiology: ordered. ?ECG/medicine tests: ordered. ? ?Risk ?Prescription drug management. ?Decision regarding hospitalization. ? ? ?Accepted handoff at shift change from [facility]. Please see prior provider note for more detail.  ? ?Briefly: Patient is 51 y.o. F smoker with h/o COPD presenting to the ED for evaluation of cough and cold symptoms.  She reports she ran out of her inhaler.   ? ?DDX: concern for new oxygen requirement versus viral illness versus COPD exacerbation. ? ?Plan: Admit for COPD exacerbation in the setting of a viral illness. ? ?The patient initially met SIRS criteria and was started on Rocephin and azithromycin by the previous team.  The lactic acid was normal so they did not initiate any fluids.  On my evaluation, the patient is able to speak in full sentences with ease but is still having some mild tachypnea and tachycardia.  She is receiving continuous neb currently but is otherwise nontoxic-appearing. ? ?Admitted to Dr.Opyd for COPD exacerbation with new oxygen requirement.  ? ? ? ?  ?Sherrell Puller, PA-C ?12/18/21 8127 ? ?  ?Tegeler, Gwenyth Allegra, MD ?12/18/21 5170 ? ?

## 2021-12-17 NOTE — Assessment & Plan Note (Addendum)
Patient was admitted to the medical ward, she was placed on supplemental 0-2 per [place], along with aggressive bronchodilator therapy. ?Systemic corticosteroids and airway clearing techniques. ? ?At the time of her discharge her symptoms have improved. ?She will have ambulatory oxymetry and continue with systemic steroids for 3 more days.  ?Continue azithromycin for 3 more days. ?New prescription for nebulizer machine and rescue bronchodilator therapy.  ?

## 2021-12-17 NOTE — ED Provider Notes (Signed)
[facility] EMERGENCY DEPARTMENT Provider Note   CSN: [id number] Arrival date & time: 12/17/21  1710     History  Chief Complaint  Patient presents with   Shortness of Breath    Barbara Stanley is a 51 y.o. female.  The history is provided by the patient and medical records. No language interpreter was used.  Shortness of Breath  51 year old female with significant history of tobacco abuse, COPD, anemia, myelodysplastic syndrome, sent here from urgent care center for evaluation of cold symptoms.  For the past 4 days patient has had body aches, congestion, cough, difficulty breathing, generalized fatigue and increased shortness of breath.  Today she also developed fever.  She mention several family members at home sick with similar symptoms.  She does have history of COPD but has ran out of her rescue inhaler.  She has been using inhaler from her daughter.  She denies any prior history of PE or DVT.  Reports symptoms moderate in severity.  She denies nausea vomiting or diarrhea.  No abdominal pain or dysuria  Home Medications Prior to Admission medications   Medication Sig Start Date End Date Taking? Authorizing Provider  Accu-Chek Softclix Lancets lancets USE AS DIRECTED USE TWO TIMES A DAY Patient taking differently: 1 each 2 (two) times daily. 01/08/21   [name], NP  acetaminophen (TYLENOL) 500 MG tablet Take 1,000 mg by mouth daily as needed for moderate pain.    [provider]  albuterol (PROVENTIL) (2.5 MG/3ML) 0.083% nebulizer solution Take 3 mLs (2.5 mg total) by nebulization every 4 (four) hours as needed for wheezing or shortness of breath. 05/30/19   Icard, Octavio Graves, DO  albuterol (VENTOLIN HFA) 108 (90 Base) MCG/ACT inhaler Inhale 1-2 puffs into the lungs every 6 (six) hours as needed for wheezing or shortness of breath. Patient not taking: Reported on 09/10/2021 02/09/21   Martyn Ehrich, NP  busPIRone (BUSPAR) 5 MG tablet TAKE 1 TABLET(5 MG) BY  MOUTH THREE TIMES DAILY Patient taking differently: Take 5 mg by mouth 3 (three) times daily. 08/17/20   [name], NP  cholecalciferol (VITAMIN D3) 25 MCG (1000 UT) tablet Take 5,000 Units by mouth daily.    [provider]  colestipol (COLESTID) 1 g tablet TAKE 2 TABLETS(2 GRAMS) BY MOUTH TWICE DAILY 09/27/21   Thornton Park, MD  Copper Gluconate 2 MG CAPS Take 4 mg by mouth daily.    [provider]  diclofenac sodium (VOLTAREN) 1 % GEL Apply 2 g topically 4 (four) times daily. 07/24/19   Azzie Glatter, FNP  dicyclomine (BENTYL) 20 MG tablet Take 1 tablet (20 mg total) by mouth in the morning, at noon, in the evening, and at bedtime. 02/24/21   Thornton Park, MD  eltrombopag (PROMACTA) 50 MG tablet Take 50 mg by mouth daily. 06/30/20   [provider]  esomeprazole (NEXIUM) 40 MG capsule Take 1 capsule (40 mg total) by mouth 2 (two) times daily. 02/24/21   Thornton Park, MD  famotidine (PEPCID) 20 MG tablet Take 1 tablet (20 mg total) by mouth 2 (two) times daily. 02/24/21   Thornton Park, MD  fluticasone-salmeterol (ADVAIR HFA) [id number] MCG/ACT inhaler Inhale 2 puffs into the lungs 2 (two) times daily. 02/09/21   Martyn Ehrich, NP  folic acid (FOLVITE) 1 MG tablet TAKE 1 TABLET(1 MG) BY MOUTH DAILY Patient taking differently: Take 1 mg by mouth daily. 03/26/20   Heath Lark, MD  meclizine (ANTIVERT) 25 MG tablet Take  1 tablet (25 mg total) by mouth 3 (three) times daily as needed for dizziness. May cause drowsiness, avoid use with phenergan 05/19/21   [name], NP  nicotine (NICODERM CQ) 21 mg/24hr patch Place 1 patch (21 mg total) onto the skin daily. 10/12/20   Lauraine Rinne, NP  nicotine polacrilex (COMMIT) 4 MG lozenge Take 1 lozenge (4 mg total) by mouth as needed for smoking cessation. 10/12/20   Lauraine Rinne, NP  ondansetron (ZOFRAN ODT) 4 MG disintegrating tablet Take 1-2 tablets (4-8 mg total) by mouth every 8 (eight) hours as needed  for nausea or vomiting. 04/20/21   Wieters, Hallie C, PA-C  promethazine (PHENERGAN) 25 MG tablet Take 1 tablet (25 mg total) by mouth every 6 (six) hours as needed for refractory nausea / vomiting. 04/20/21   Wieters, Hallie C, PA-C  solifenacin (VESICARE) 10 MG tablet Take 1 tablet (10 mg total) by mouth daily. 04/06/21   Chancy Milroy, MD  sulfamethoxazole-trimethoprim (BACTRIM DS) 800-160 MG tablet Take 1 tablet by mouth See admin instructions. Monday, Wednesday, Friday    [provider]  Tiotropium Bromide Monohydrate (SPIRIVA RESPIMAT) 2.5 MCG/ACT AERS Inhale 2 puffs into the lungs daily. 02/09/21   Martyn Ehrich, NP  traZODone (DESYREL) 150 MG tablet Take 1 tablet (150 mg total) by mouth at bedtime. For sleep Patient taking differently: Take 400 mg by mouth at bedtime. For sleep 11/08/14   Rankin, Shuvon B, NP  venlafaxine XR (EFFEXOR-XR) 75 MG 24 hr capsule Take 1 capsule (75 mg total) by mouth daily with breakfast. For depression 11/08/14   Rankin, Shuvon B, NP      Allergies    Other    Review of Systems   Review of Systems  Respiratory:  Positive for shortness of breath.   All other systems reviewed and are negative.  Physical Exam Updated Vital Signs BP (!) 148/80 (BP Location: Right Arm)    Pulse (!) 127    Temp 99 F (37.2 C) (Oral)    Resp (!) 28    Ht 5\' 6"  (1.676 m)    Wt 108 kg    LMP  (LMP Unknown)    SpO2 (!) 85%    BMI 38.43 kg/m  Physical Exam Vitals and nursing note reviewed.  Constitutional:      Appearance: She is well-developed. She is ill-appearing.  HENT:     Head: Atraumatic.  Eyes:     Conjunctiva/sclera: Conjunctivae normal.  Cardiovascular:     Rate and Rhythm: Tachycardia present.  Pulmonary:     Effort: Tachypnea, accessory muscle usage and respiratory distress present.     Breath sounds: Decreased breath sounds present. No wheezing, rhonchi or rales.  Abdominal:     Palpations: Abdomen is soft.  Musculoskeletal:     Cervical back:  Neck supple.     Right lower leg: No edema.  Skin:    Findings: No rash.  Neurological:     Mental Status: She is alert.  Psychiatric:        Mood and Affect: Mood normal.    ED Results / Procedures / Treatments   Labs (all labs ordered are listed, but only abnormal results are displayed) Labs Reviewed  COMPREHENSIVE METABOLIC PANEL - Abnormal; Notable for the following components:      Result Value   Sodium 134 (*)    Potassium 3.4 (*)    Glucose, Bld 107 (*)    Calcium 8.4 (*)    Total  Protein 6.4 (*)    Albumin 3.3 (*)    AST 14 (*)    All other components within normal limits  CBC WITH DIFFERENTIAL/PLATELET - Abnormal; Notable for the following components:   RBC 3.51 (*)    Hemoglobin 10.5 (*)    HCT 32.7 (*)    RDW 18.6 (*)    Platelets 43 (*)    nRBC 0.5 (*)    Lymphs Abs 0.5 (*)    Abs Immature Granulocytes 0.36 (*)    All other components within normal limits  I-STAT VENOUS BLOOD GAS, ED - Abnormal; Notable for the following components:   pH, Ven 7.506 (*)    pCO2, Ven 34.6 (*)    pO2, Ven 68 (*)    Acid-Base Excess 4.0 (*)    Potassium 3.4 (*)    Calcium, Ion 1.08 (*)    HCT 30.0 (*)    Hemoglobin 10.2 (*)    All other components within normal limits  RESP PANEL BY RT-PCR (FLU A&B, COVID) ARPGX2  CULTURE, BLOOD (ROUTINE X 2)  CULTURE, BLOOD (ROUTINE X 2)  URINE CULTURE  LACTIC ACID, PLASMA  PROTIME-INR  APTT  LACTIC ACID, PLASMA  URINALYSIS, ROUTINE W REFLEX MICROSCOPIC  PREGNANCY, URINE    EKG EKG Interpretation  Date/Time:  Friday December 17 2021 17:33:05 EST Ventricular Rate:  113 PR Interval:  131 QRS Duration: 92 QT Interval:  332 QTC Calculation: 456 R Axis:   82 Text Interpretation: Sinus tachycardia Low voltage, precordial leads Borderline repolarization abnormality when compared to prior, faster rate. No STEMI Confirmed by Antony Blackbird [id number]) on 12/17/2021 6:50:36 PM ED ECG REPORT   Date: 12/17/2021  Rate: 113  Rhythm: sinus  tachycardia  QRS Axis: normal  Intervals: normal  ST/T Wave abnormalities: normal  Conduction Disutrbances:none  Narrative Interpretation:   Old EKG Reviewed: unchanged  I have personally reviewed the EKG tracing and agree with the computerized printout as noted.   Radiology DG Chest Port 1 View  Result Date: 12/17/2021 CLINICAL DATA:  Worsening shortness of breath since yesterday, productive cough EXAM: PORTABLE CHEST 1 VIEW COMPARISON:  07/27/2018 FINDINGS: The heart size and mediastinal contours are within normal limits. Both lungs are clear. The visualized skeletal structures are unremarkable. IMPRESSION: No active disease. Electronically Signed   By: Randa Ngo M.D.   On: 12/17/2021 17:46    Procedures .Critical Care Performed by: Domenic Moras, PA-C Authorized by: Domenic Moras, PA-C   Critical care provider statement:    Critical care time (minutes):  30   Critical care was necessary to treat or prevent imminent or life-threatening deterioration of the following conditions:  Respiratory failure and sepsis   Critical care was time spent personally by me on the following activities:  Development of treatment plan with patient or surrogate, discussions with consultants, evaluation of patient's response to treatment, examination of patient, ordering and review of laboratory studies, ordering and review of radiographic studies, ordering and performing treatments and interventions, pulse oximetry, re-evaluation of patient's condition and review of old charts    Medications Ordered in ED Medications  lactated ringers infusion ( Intravenous New Bag/Given 12/17/21 1900)  cefTRIAXone (ROCEPHIN) 2 g in sodium chloride 0.9 % 100 mL IVPB (0 g Intravenous Stopped 12/17/21 1856)  azithromycin (ZITHROMAX) 500 mg in sodium chloride 0.9 % 250 mL IVPB (0 mg Intravenous Stopped 12/17/21 1859)  ondansetron (ZOFRAN) injection 4 mg (has no administration in time range)  ondansetron (ZOFRAN) 4 MG/2ML  injection (has no administration  in time range)  albuterol (PROVENTIL) (2.5 MG/3ML) 0.083% nebulizer solution 10 mg (10 mg Nebulization New Bag/Given 12/17/21 1840)  ipratropium-albuterol (DUONEB) 0.5-2.5 (3) MG/3ML nebulizer solution 3 mL (3 mLs Nebulization Given 12/17/21 1744)  albuterol (PROVENTIL) (2.5 MG/3ML) 0.083% nebulizer solution 2.5 mg (2.5 mg Nebulization Given 12/17/21 1744)  lactated ringers bolus 800 mL (0 mLs Intravenous Stopped 12/17/21 1837)  ondansetron (ZOFRAN) injection 4 mg (4 mg Intravenous Given 12/17/21 1806)  methylPREDNISolone sodium succinate (SOLU-MEDROL) 125 mg/2 mL injection 125 mg (125 mg Intravenous Given 12/17/21 1838)    ED Course/ Medical Decision Making/ A&P                           Medical Decision Making Amount and/or Complexity of Data Reviewed Labs: ordered. Radiology: ordered. ECG/medicine tests: ordered.  Risk Prescription drug management.   BP (!) 148/80 (BP Location: Right Arm)    Pulse (!) 127    Temp 99 F (37.2 C) (Oral)    Resp (!) 28    Ht 5\' 6"  (1.676 m)    Wt 108 kg    LMP  (LMP Unknown)    SpO2 (!) 85%    BMI 38.43 kg/m   5:35 PM This is a 51 year old female significant history of COPD presents today with cold symptoms after exposure to sick contact by her family members.  Patient initially was seen at urgent care center but sent here for further assessment.  She was found to be febrile, tachycardic, tachypneic, and hypoxic.  Significant decrease in lung sounds on exam.  Will provide supplemental oxygen, will give breathing treatment.  Code sepsis initiated, suspect pulmonary source causing her symptoms therefore antibiotic including Rocephin and Zithromax have been initiated.  6:30 PM Labs and imaging was independently reviewed and interpreted by me.  Initial chest x-ray did not show any focal infiltrate concerning finding.  Patient has normal lactic acid therefore she does not need to be fluid resuscitated at 30 mill per kilogram.   Electrolyte panels are reassuring.  She is mildly alkalotic with a pH of 7.5 and a PCO2 of 68.   Patient has markedly decreased lung sounds on exam therefore she was given multiple breathing treatment with some improvement.  Although resp panel negative for covid/flu, given her presentation I still suspect this is likely COPD exacerbation in the setting of viral illness.    7:10 PM Pt sign out to oncoming provider who will request medicine admission.  I did reassess pt and although she report feeling a bit better with treatment, her lungs sounds are diminished and she will benefit from admission.   This patient presents to the ED for concern of SOB, this involves an extensive number of treatment options, and is a complaint that carries with it a high risk of complications and morbidity.  The differential diagnosis includes PNA, viral illness, COPD exacerbation, sepsis, pleural effusion, PE  Co morbidities that complicate the patient evaluation COPD  Anemia  MDS Additional history obtained:  Additional history obtained from family members External records from outside source obtained and reviewed including note from [facility] today  Lab Tests:  I Ordered, and personally interpreted labs.  The pertinent results include:  as above  Imaging Studies ordered:  I ordered imaging studies including CXR I independently visualized and interpreted imaging which showed no acute finding I agree with the radiologist interpretation  Cardiac Monitoring:  The patient was maintained on a cardiac monitor.  I personally viewed  and interpreted the cardiac monitored which showed an underlying rhythm of: sinus tachycardia  Medicines ordered and prescription drug management:  I ordered medication including rocephin/zithromax  for pulmonary infectious source Reevaluation of the patient after these medicines showed that the patient improved I have reviewed the patients home medicines and have made adjustments  as needed  Test Considered: as above  Critical Interventions: IV abx  Continuous albuterol/atrovent breathing treatment  steroid           Final Clinical Impression(s) / ED Diagnoses Final diagnoses:  COPD exacerbation ([place])  Viral illness  SIRS (systemic inflammatory response syndrome) ([place])  Hypoxia    Rx / DC Orders ED Discharge Orders     None         Domenic Moras, PA-C 12/17/21 1911    Tegeler, Gwenyth Allegra, MD 12/17/21 1918

## 2021-12-17 NOTE — ED Notes (Signed)
SPO2 85% in triage once in room placed on 2LPM [place],  BBS distant insp/exp wheezes, tripoding in bed,  ?

## 2021-12-17 NOTE — ED Provider Notes (Signed)
[name]    CSN: [id number] Arrival date & time: 12/17/21  1528      History   Chief Complaint Chief Complaint  Patient presents with   Cough    HPI Barbara Stanley is a 51 y.o. female.   Patient here today for evaluation of shortness of breath, cough, congestion and runny nose that started yesterday.  She reports some sharp pain when she takes a deep breath and when she lies down.  She has been taking Tylenol and NyQuil without relief.  She reports that most recently her refills of her inhalers were denied by insurance and she has not been able to take her maintenance medications due to same.  She has tried using her daughter's inhaler without resolution but does state it did help initially.  She does have some family members at home that have similar symptoms.  The history is provided by the patient.   Past Medical History:  Diagnosis Date   Abdominal pain    Anemia    Anxiety    Bronchitis    COPD (chronic obstructive pulmonary disease) (HCC)    Depression    bipolar   Diarrhea    Emphysema    GERD (gastroesophageal reflux disease)    History of blood transfusion    MDS (myelodysplastic syndrome) (HCC)    Splenomegaly    r/t low platelets   Thrombocytopenia (HCC)    Tobacco abuse     Patient Active Problem List   Diagnosis Date Noted   Malnutrition, unspecified type (HCC) 04/01/2021   Former smoker 02/09/2021   Healthcare maintenance 10/12/2020   Iron deficiency anemia 11/12/2019   Splenomegaly, congestive, chronic 09/05/2019   Chronic ITP (idiopathic thrombocytopenia) (HCC) 08/28/2019   Urinary incontinence, mixed 08/13/2019   MDS (myelodysplastic syndrome), low grade (HCC) 07/26/2019   Vitamin D deficiency 10/08/2018   Vitamin B12 deficiency due to intestinal malabsorption 10/08/2018   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/27/2018   Dietary copper deficiency 09/27/2018   Other pancytopenia (HCC) 09/22/2018   Thrombocytopenia (HCC) 08/08/2018   MDD (major  depressive disorder), single episode, 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/2014   PTSD (post-traumatic stress disorder) 11/05/2014   Depressive disorder 06/04/2014   Anxiety disorder 06/04/2014   Major depression, recurrent (HCC) 03/21/2014   Suicide attempt (HCC) 03/21/2014   Suicidal ideations 03/21/2014   MDD (major depressive disorder) 03/21/2014   Nonspecific abnormal unspecified cardiovascular function study 09/30/2013   Tobacco use disorder 09/30/2013   COPD (chronic obstructive pulmonary disease) (HCC)    GERD (gastroesophageal reflux disease)    Anemia     Past Surgical History:  Procedure Laterality Date   ABDOMINAL HYSTERECTOMY     c section x 3      CHOLECYSTECTOMY     GASTRIC BYPASS     UPPER GASTROINTESTINAL ENDOSCOPY     WISDOM TOOTH EXTRACTION      OB History     Gravida  4   Para      Term      Preterm      AB      Living  3      SAB      IAB      Ectopic      Multiple      Live Births               Home Medications    Prior to Admission medications   Medication Sig Start Date End Date Taking? Authorizing Provider  Accu-Chek Softclix  Lancets lancets USE AS DIRECTED USE TWO TIMES A DAY Patient taking differently: 1 each 2 (two) times daily. 01/08/21   Barbette Merino, NP  acetaminophen (TYLENOL) 500 MG tablet Take 1,000 mg by mouth daily as needed for moderate pain.    [provider]  albuterol (PROVENTIL) (2.5 MG/3ML) 0.083% nebulizer solution Take 3 mLs (2.5 mg total) by nebulization every 4 (four) hours as needed for wheezing or shortness of breath. 05/30/19   Icard, Rachel Bo, DO  albuterol (VENTOLIN HFA) 108 (90 Base) MCG/ACT inhaler Inhale 1-2 puffs into the lungs every 6 (six) hours as needed for wheezing or shortness of breath. Patient not taking: Reported on 09/10/2021 02/09/21   Glenford Bayley, NP  busPIRone (BUSPAR) 5 MG tablet TAKE 1 TABLET(5 MG) BY MOUTH THREE TIMES DAILY Patient taking differently: Take 5 mg by  mouth 3 (three) times daily. 08/17/20   Barbette Merino, NP  cholecalciferol (VITAMIN D3) 25 MCG (1000 UT) tablet Take 5,000 Units by mouth daily.    [provider]  colestipol (COLESTID) 1 g tablet TAKE 2 TABLETS(2 GRAMS) BY MOUTH TWICE DAILY 09/27/21   Tressia Danas, MD  Copper Gluconate 2 MG CAPS Take 4 mg by mouth daily.    [provider]  diclofenac sodium (VOLTAREN) 1 % GEL Apply 2 g topically 4 (four) times daily. 07/24/19   Kallie Locks, FNP  dicyclomine (BENTYL) 20 MG tablet Take 1 tablet (20 mg total) by mouth in the morning, at noon, in the evening, and at bedtime. 02/24/21   Tressia Danas, MD  eltrombopag (PROMACTA) 50 MG tablet Take 50 mg by mouth daily. 06/30/20   [provider]  esomeprazole (NEXIUM) 40 MG capsule Take 1 capsule (40 mg total) by mouth 2 (two) times daily. 02/24/21   Tressia Danas, MD  famotidine (PEPCID) 20 MG tablet Take 1 tablet (20 mg total) by mouth 2 (two) times daily. 02/24/21   Tressia Danas, MD  fluticasone-salmeterol (ADVAIR HFA) [phone] MCG/ACT inhaler Inhale 2 puffs into the lungs 2 (two) times daily. 02/09/21   Glenford Bayley, NP  folic acid (FOLVITE) 1 MG tablet TAKE 1 TABLET(1 MG) BY MOUTH DAILY Patient taking differently: Take 1 mg by mouth daily. 03/26/20   Artis Delay, MD  meclizine (ANTIVERT) 25 MG tablet Take 1 tablet (25 mg total) by mouth 3 (three) times daily as needed for dizziness. May cause drowsiness, avoid use with phenergan 05/19/21   Barbette Merino, NP  nicotine (NICODERM CQ) 21 mg/24hr patch Place 1 patch (21 mg total) onto the skin daily. 10/12/20   Coral Ceo, NP  nicotine polacrilex (COMMIT) 4 MG lozenge Take 1 lozenge (4 mg total) by mouth as needed for smoking cessation. 10/12/20   Coral Ceo, NP  ondansetron (ZOFRAN ODT) 4 MG disintegrating tablet Take 1-2 tablets (4-8 mg total) by mouth every 8 (eight) hours as needed for nausea or vomiting. 04/20/21   Wieters, Hallie C, PA-C   promethazine (PHENERGAN) 25 MG tablet Take 1 tablet (25 mg total) by mouth every 6 (six) hours as needed for refractory nausea / vomiting. 04/20/21   Wieters, Hallie C, PA-C  solifenacin (VESICARE) 10 MG tablet Take 1 tablet (10 mg total) by mouth daily. 04/06/21   Hermina Staggers, MD  sulfamethoxazole-trimethoprim (BACTRIM DS) 800-160 MG tablet Take 1 tablet by mouth See admin instructions. Monday, Wednesday, Friday    [provider]  Tiotropium Bromide Monohydrate (SPIRIVA RESPIMAT) 2.5 MCG/ACT AERS Inhale 2  puffs into the lungs daily. 02/09/21   Glenford Bayley, NP  traZODone (DESYREL) 150 MG tablet Take 1 tablet (150 mg total) by mouth at bedtime. For sleep Patient taking differently: Take 400 mg by mouth at bedtime. For sleep 11/08/14   Rankin, Shuvon B, NP  venlafaxine XR (EFFEXOR-XR) 75 MG 24 hr capsule Take 1 capsule (75 mg total) by mouth daily with breakfast. For depression 11/08/14   Rankin, Shuvon B, NP    Family History Family History  Problem Relation Age of Onset   Diabetes Mother    Diabetes Other    Hypertension Other    CAD Other    Heart disease Father    Colon cancer Neg Hx    Rectal cancer Neg Hx    Stomach cancer Neg Hx    Esophageal cancer Neg Hx     Social History Social History   Tobacco Use   Smoking status: Every Day    Packs/day: 0.50    Years: 30.00    Pack years: 15.00    Types: Cigarettes    Last attempt to quit: 01/31/2021    Years since quitting: 0.8   Smokeless tobacco: Never   Tobacco comments:    quit mid april 2022  Vaping Use   Vaping Use: Never used  Substance Use Topics   Alcohol use: Yes    Comment: occasional wine   Drug use: No     Allergies   Other   Review of Systems Review of Systems  Constitutional:  Positive for fever.  HENT:  Positive for congestion. Negative for ear pain and sore throat.   Eyes:  Negative for discharge and redness.  Respiratory:  Positive for cough and shortness of breath.    Gastrointestinal:  Negative for abdominal pain, diarrhea, nausea and vomiting.    Physical Exam Triage Vital Signs ED Triage Vitals  Enc Vitals Group     BP      Pulse      Resp      Temp      Temp src      SpO2      Weight      Height      Head Circumference      Peak Flow      Pain Score      Pain Loc      Pain Edu?      Excl. in GC?    No data found.  Updated Vital Signs BP 130/83 (BP Location: Left Arm)   Pulse (!) 116   Temp 99.9 F (37.7 C) (Oral)   Resp 20   LMP  (LMP Unknown)   SpO2 91%      Physical Exam Vitals and nursing note reviewed.  Constitutional:      General: She is not in acute distress.    Appearance: Normal appearance. She is not ill-appearing.  HENT:     Head: Normocephalic and atraumatic.     Nose: Congestion present.  Eyes:     Conjunctiva/sclera: Conjunctivae normal.  Cardiovascular:     Rate and Rhythm: Tachycardia present.  Pulmonary:     Effort: Respiratory distress (mild respiratory distress- unable to complete full sentences) present.     Breath sounds: Wheezing (occasional audible wheeze) present.  Skin:    General: Skin is warm and dry.  Neurological:     Mental Status: She is alert.  Psychiatric:        Mood and Affect: Mood normal.  Thought Content: Thought content normal.     UC Treatments / Results  Labs (all labs ordered are listed, but only abnormal results are displayed) Labs Reviewed - No data to display  EKG   Radiology No results found.  Procedures Procedures (including critical care time)  Medications Ordered in UC Medications - No data to display  Initial Impression / Assessment and Plan / UC Course  I have reviewed the triage vital signs and the nursing notes.  Pertinent labs & imaging results that were available during my care of the patient were reviewed by me and considered in my medical decision making (see chart for details).    Given hypoxia, tachycardia, known COPD, MDS  recommended further evaluation in the ED for stat labs. She is agreeable to same. Family member will drive her POV after leaving our office. Offered nebulizer treatment while in office but patient kindly declines.   Final Clinical Impressions(s) / UC Diagnoses   Final diagnoses:  COPD exacerbation [facility])  Hypoxia     Discharge Instructions       Please report to:  [facility]  [address]   [address] [place], [place] [address]  [phone]       ED Prescriptions   None    PDMP not reviewed this encounter.   Tomi Bamberger, PA-C 12/17/21 1654

## 2021-12-17 NOTE — Sepsis Progress Note (Signed)
eLink is following this Code Sepsis. °

## 2021-12-17 NOTE — Discharge Instructions (Signed)
?  Please report to: ? ?[facility] ? ?[address]   [address] ?[place], [place] [address] ? ?[phone] ? ?

## 2021-12-17 NOTE — ED Notes (Signed)
States began having worsening shortness of breath since yesterday, having prod cough of thick yellow sputum, cont to smoke despite having dx of COPD. Appears to have DOE and dyspnea increases with speaking, very congested cough also noted.;  ?

## 2021-12-17 NOTE — Assessment & Plan Note (Signed)
Continue home Effexor XR, BuSpar, trazodone. ?

## 2021-12-17 NOTE — Hospital Course (Addendum)
Mrs. Scifres was admitted to the hospital with the working diagnosis of COPD exacerbation.  ? ?Aavya Shafer is a 51 y.o. female with medical history significant for COPD, myelodysplastic syndrome with chronic thrombocytopenia, depression/anxiety, and tobacco use who presented with dyspnea. Reported 2 days of worsening dyspnea, associated with increase phlegm and cough. Positive pleuritic chest pain. At home not on rescue bronchodilator therapy due to insurance issues. On her initial physical examination her blood pressure was 130/83, HR 116, RR 20 and 02 saturation 92% to 85% on room air, temp 99.9, lungs with distant breath sounds with no wheezing or rhonchi, heart with S1 and S2 present and rhythmic, abdomen soft and no lower extremity edema.  ? ?Na 134. K 3,4 CL 98, bicarbonate 25, glucose 107, bun 10 and cr at 0,67.  ?Wbc 6,2, hgb 10,5 hct 32,7 and plt 43.  ?[facility] covid 19 negative  ?Viral panel negative  ? ?Chest radiograph with hyperinflation, bilateral atelectasis at bases. ? ?EKG 113 bpm, normal axis, normal intervals, sinus rhythm, with no significant ST segment or T wave changes.  ? ?Patient was placed on aggressive bronchodilator therapy and systemic steroids with improvement of her symptoms.   ? ? ?

## 2021-12-18 DIAGNOSIS — Z8249 Family history of ischemic heart disease and other diseases of the circulatory system: Secondary | ICD-10-CM | POA: Diagnosis not present

## 2021-12-18 DIAGNOSIS — D4620 Refractory anemia with excess of blasts, unspecified: Secondary | ICD-10-CM | POA: Diagnosis not present

## 2021-12-18 DIAGNOSIS — J9811 Atelectasis: Secondary | ICD-10-CM | POA: Diagnosis present

## 2021-12-18 DIAGNOSIS — Z9109 Other allergy status, other than to drugs and biological substances: Secondary | ICD-10-CM | POA: Diagnosis not present

## 2021-12-18 DIAGNOSIS — J441 Chronic obstructive pulmonary disease with (acute) exacerbation: Secondary | ICD-10-CM | POA: Diagnosis present

## 2021-12-18 DIAGNOSIS — Z20822 Contact with and (suspected) exposure to covid-19: Secondary | ICD-10-CM | POA: Diagnosis present

## 2021-12-18 DIAGNOSIS — J9601 Acute respiratory failure with hypoxia: Secondary | ICD-10-CM | POA: Diagnosis not present

## 2021-12-18 DIAGNOSIS — K219 Gastro-esophageal reflux disease without esophagitis: Secondary | ICD-10-CM | POA: Diagnosis present

## 2021-12-18 DIAGNOSIS — Z833 Family history of diabetes mellitus: Secondary | ICD-10-CM | POA: Diagnosis not present

## 2021-12-18 DIAGNOSIS — E669 Obesity, unspecified: Secondary | ICD-10-CM | POA: Diagnosis present

## 2021-12-18 DIAGNOSIS — F17200 Nicotine dependence, unspecified, uncomplicated: Secondary | ICD-10-CM | POA: Diagnosis not present

## 2021-12-18 DIAGNOSIS — F32A Depression, unspecified: Secondary | ICD-10-CM | POA: Diagnosis present

## 2021-12-18 DIAGNOSIS — F17210 Nicotine dependence, cigarettes, uncomplicated: Secondary | ICD-10-CM | POA: Diagnosis present

## 2021-12-18 DIAGNOSIS — E872 Acidosis, unspecified: Secondary | ICD-10-CM | POA: Diagnosis present

## 2021-12-18 DIAGNOSIS — F418 Other specified anxiety disorders: Secondary | ICD-10-CM | POA: Diagnosis not present

## 2021-12-18 DIAGNOSIS — Z79899 Other long term (current) drug therapy: Secondary | ICD-10-CM | POA: Diagnosis not present

## 2021-12-18 DIAGNOSIS — Z6838 Body mass index (BMI) 38.0-38.9, adult: Secondary | ICD-10-CM | POA: Diagnosis not present

## 2021-12-18 DIAGNOSIS — F419 Anxiety disorder, unspecified: Secondary | ICD-10-CM | POA: Diagnosis present

## 2021-12-18 DIAGNOSIS — D469 Myelodysplastic syndrome, unspecified: Secondary | ICD-10-CM | POA: Diagnosis present

## 2021-12-18 DIAGNOSIS — Z9884 Bariatric surgery status: Secondary | ICD-10-CM | POA: Diagnosis not present

## 2021-12-18 DIAGNOSIS — D61818 Other pancytopenia: Secondary | ICD-10-CM | POA: Diagnosis present

## 2021-12-18 LAB — CBC
HCT: 29.3 % — ABNORMAL LOW (ref 36.0–46.0)
Hemoglobin: 9 g/dL — ABNORMAL LOW (ref 12.0–15.0)
MCH: 29.2 pg (ref 26.0–34.0)
MCHC: 30.7 g/dL (ref 30.0–36.0)
MCV: 95.1 fL (ref 80.0–100.0)
Platelets: 38 10*3/uL — ABNORMAL LOW (ref 150–400)
RBC: 3.08 MIL/uL — ABNORMAL LOW (ref 3.87–5.11)
RDW: 18.5 % — ABNORMAL HIGH (ref 11.5–15.5)
WBC: 3.2 10*3/uL — ABNORMAL LOW (ref 4.0–10.5)
nRBC: 0 % (ref 0.0–0.2)

## 2021-12-18 LAB — RESPIRATORY PANEL BY PCR

## 2021-12-18 LAB — BASIC METABOLIC PANEL
Anion gap: 7 (ref 5–15)
BUN: 9 mg/dL (ref 6–20)
CO2: 28 mmol/L (ref 22–32)
Calcium: 8.2 mg/dL — ABNORMAL LOW (ref 8.9–10.3)
Chloride: 100 mmol/L (ref 98–111)
Creatinine, Ser: 0.55 mg/dL (ref 0.44–1.00)
GFR, Estimated: >60 mL/min (ref >60)
Glucose, Bld: 171 mg/dL — ABNORMAL HIGH (ref 70–99)
Potassium: 3.5 mmol/L (ref 3.5–5.1)
Sodium: 135 mmol/L (ref 135–145)

## 2021-12-18 LAB — HIV ANTIBODY (ROUTINE TESTING W REFLEX): HIV Screen 4th Generation wRfx: NONREACTIVE

## 2021-12-18 LAB — LACTIC ACID, PLASMA: Lactic Acid, Venous: 1.7 mmol/L (ref 0.5–1.9)

## 2021-12-18 MED ORDER — SODIUM CHLORIDE 0.9 % IV BOLUS
1000.0000 mL | Freq: Once | INTRAVENOUS | Status: AC
  Administered 2021-12-18: 1000 mL via INTRAVENOUS

## 2021-12-18 MED ORDER — IPRATROPIUM-ALBUTEROL 0.5-2.5 (3) MG/3ML IN SOLN
3.0000 mL | Freq: Three times a day (TID) | RESPIRATORY_TRACT | Status: DC
  Administered 2021-12-19 – 2021-12-20 (×4): 3 mL via RESPIRATORY_TRACT
  Filled 2021-12-18 (×4): qty 3

## 2021-12-18 MED ORDER — PANTOPRAZOLE SODIUM 40 MG PO TBEC
40.0000 mg | DELAYED_RELEASE_TABLET | Freq: Every day | ORAL | Status: DC
  Administered 2021-12-18 – 2021-12-20 (×3): 40 mg via ORAL
  Filled 2021-12-18 (×3): qty 1

## 2021-12-18 MED ORDER — GUAIFENESIN ER 600 MG PO TB12
600.0000 mg | ORAL_TABLET | Freq: Two times a day (BID) | ORAL | Status: DC
Start: 2021-12-18 — End: 2021-12-20
  Administered 2021-12-18 – 2021-12-20 (×4): 600 mg via ORAL
  Filled 2021-12-18 (×5): qty 1

## 2021-12-18 MED ORDER — FLUTICASONE PROPIONATE 50 MCG/ACT NA SUSP
2.0000 | Freq: Every day | NASAL | Status: DC
  Administered 2021-12-18 – 2021-12-20 (×3): 2 via NASAL
  Filled 2021-12-18: qty 16

## 2021-12-18 NOTE — Progress Notes (Signed)
Date and time results received: 12/18/21 0000 ? ? ?Test: Lactic Acid ?Critical Value: 3.0 ? ?Name of Provider Notified: Zada Finders, MD ? ?Orders Received? Or Actions Taken?: See new orders ?

## 2021-12-18 NOTE — Progress Notes (Signed)
?PROGRESS NOTE ? ? ? ?Barbara Stanley  ZJI:[id number] DOB: [birth year] DOA: 12/17/2021 ?PCP: [name], NP  ? ? ? ?Brief Narrative:  ? ?Barbara Stanley is a 51 y.o. female with medical history significant for depression/anxiety, gastric bypass,  myelodysplastic syndrome with chronic thrombocytopenia, tobacco abuse, COPD is admitted with acute COPD exacerbation/acute hypoxic respiratory failure ? ? ? ?Subjective: ? ?Remain congested with intermittent coughing spells ?No fever ?Right-sided pleuritic chest pain has resolved ?Report all family members have similar symptoms ? ?Assessment & Plan: ? Principal Problem: ?  Acute exacerbation of chronic obstructive pulmonary disease (COPD) ([place]) ?Active Problems: ?  MDS (myelodysplastic syndrome), low grade ([place]) ?  Thrombocytopenia ([place]) ?  COPD (chronic obstructive pulmonary disease) ([place]) ?  Tobacco use disorder ?  Depression with anxiety ? ? ? ?Assessment and Plan: ? ?COPD exacerbation/acute hypoxic respiratory failure, baseline not home O2 dependent ?-  SPO2 was 85% on room air in triage , placed on 2LPM  , initially  BBS distant insp/exp wheezes, tripoding in bed,  ?-Chest x-ray no acute infiltrate ?-COVID and flu screening negative ?-She did report sick contact,  will add on respiratory viral panel ?-continue  steroid, Zithromax , DuoNeb , add Mucinex and Flonase -wean oxygen as able ? ?Lactic acidosis ?Likely due to hypoxia ?Normalized ?Blood culture in process ? ? ? ?MDS (myelodysplastic syndrome), low grade (HCC)/pancytopenia ?  Follows with hematology/oncology Dr. Lynn Ito with [facility]. ?-Continue Promacta ?-Monitor CBC, no indication for transfusion currently, no sign of bleeding ? ? ? ?Depression with anxiety ?Continue home Effexor XR, BuSpar, trazodone. ? ?Tobacco use disorder ?Smoke about 5 cigarettes/day, down from 2 packs/day.  Patient declines nicotine patch. ? ?Obesity: Body mass index is 38.43 kg/m?Marland KitchenMarland Kitchen ?History of gastric bypass  surgery ? ? ? ?I have Reviewed nursing notes, Vitals, pain scores, I/o's, Lab results and  imaging results since pt's last encounter, details please see discussion above  ?I ordered the following labs:  ?Unresulted Labs (From admission, onward)  ? ?  Start     Ordered  ? 12/19/21 0500  CBC with Differential/Platelet  Tomorrow morning,   R       ? 12/18/21 0830  ? 12/19/21 1751  Basic metabolic panel  Tomorrow morning,   R       ? 12/18/21 0830  ? 12/19/21 0500  Magnesium  Tomorrow morning,   STAT       ? 12/18/21 0830  ? 12/18/21 0827  Respiratory (~20 pathogens) panel by PCR  (Respiratory panel by PCR (~20 pathogens, ~24 hr TAT)  w precautions)  Once,   R       ? 12/18/21 0826  ? 12/17/21 1733  Pregnancy, urine  (Septic presentation on arrival (screening labs, nursing and treatment orders for obvious sepsis))  Once,   R       ? 12/17/21 1733  ? ?  ?  ? ?  ? ? ? ?DVT prophylaxis: SCDs Start: 12/17/21 2323 ? ? ?Code Status:   Code Status: Full Code ? ?Family Communication: Patient ?Disposition:  ? ?Dispo: The patient is from: Home ?             Anticipated d/c is to: Home ?             Anticipated d/c date is: 24 to 48 hours ? ?Antimicrobials:   ?Anti-infectives (From admission, onward)  ? ? Start     Dose/Rate Route Frequency Ordered Stop  ? 12/18/21 1000  azithromycin [facility]) tablet 500 mg       ? 500 mg Oral Daily 12/17/21 2325 12/22/21 0959  ? 12/17/21 1745  cefTRIAXone (ROCEPHIN) 2 g in sodium chloride 0.9 % 100 mL IVPB  Status:  Discontinued       ? 2 g ?200 mL/hr over 30 Minutes Intravenous Every 24 hours 12/17/21 1733 12/17/21 2325  ? 12/17/21 1745  azithromycin (ZITHROMAX) 500 mg in sodium chloride 0.9 % 250 mL IVPB  Status:  Discontinued       ? 500 mg ?250 mL/hr over 60 Minutes Intravenous Every 24 hours 12/17/21 1733 12/17/21 2325  ? ?  ? ? ? ? ? ? ?Objective: ?Vitals:  ? 12/18/21 0846 12/18/21 0849 12/18/21 1409 12/18/21 1434  ?BP:   (!) 115/59   ?Pulse:   80   ?Resp:   20   ?Temp:   98 ?F (36.7 ?C)    ?TempSrc:      ?SpO2: 94% 94% 96% 94%  ?Weight:      ?Height:      ? ? ?Intake/Output Summary (Last 24 hours) at 12/18/2021 1601 ?Last data filed at 12/18/2021 0900 ?Gross per 24 hour  ?Intake 720 ml  ?Output --  ?Net 720 ml  ? ?Filed Weights  ? 12/17/21 1717  ?Weight: 108 kg  ? ? ?Examination: ? ?General exam: AAOx3, sounds congested, does not appear in acute distress ?Respiratory system: Very diminished, poor air movement ,no significant wheezing ,respiratory effort normal. ?Cardiovascular system:  RRR.  ?Gastrointestinal system: Abdomen is nondistended, soft and nontender.  Normal bowel sounds heard. ?Central nervous system: Alert and oriented. No focal neurological deficits. ?Extremities:  no edema ?Skin: No rashes, lesions or ulcers ?Psychiatry: Judgement and insight appear normal. Mood & affect appropriate.  ? ? ? ?Data Reviewed: I have personally reviewed  labs and visualized  imaging studies since the last encounter and formulate the plan  ? ? ? ? ? ? ?Scheduled Meds: ? azithromycin  500 mg Oral Daily  ? busPIRone  5 mg Oral TID  ? eltrombopag  50 mg Oral Daily  ? famotidine  20 mg Oral BID  ? fluticasone  2 spray Each Nare Daily  ? folic acid  1 mg Oral Daily  ? guaiFENesin  600 mg Oral BID  ? ipratropium-albuterol  3 mL Nebulization Q6H  ? methylPREDNISolone (SOLU-MEDROL) injection  40 mg Intravenous Q12H  ? mometasone-formoterol  2 puff Inhalation BID  ? traZODone  400 mg Oral QHS  ? venlafaxine XR  225 mg Oral Q breakfast  ? ?Continuous Infusions: ? ? LOS: 0 days  ? ? ? ? ?Florencia Reasons, MD PhD FACP ?[facility] ? ?Available via Epic secure chat 7am-7pm for nonurgent issues ?Please page for urgent issues ?To page the attending provider between 7A-7P or the covering provider during after hours 7P-7A, please log into the web site [email or web address] and access using universal [place] password for that web site. If you do not have the password, please call the hospital operator. ? ? ? ?12/18/2021, 4:01 PM  ? ? ?

## 2021-12-19 DIAGNOSIS — J441 Chronic obstructive pulmonary disease with (acute) exacerbation: Secondary | ICD-10-CM | POA: Diagnosis not present

## 2021-12-19 LAB — CBC WITH DIFFERENTIAL/PLATELET
Abs Immature Granulocytes: 0.17 10*3/uL — ABNORMAL HIGH (ref 0.00–0.07)
Basophils Absolute: 0 10*3/uL (ref 0.0–0.1)
Basophils Relative: 0 %
Eosinophils Absolute: 0 10*3/uL (ref 0.0–0.5)
Eosinophils Relative: 0 %
HCT: 30.5 % — ABNORMAL LOW (ref 36.0–46.0)
Hemoglobin: 9.5 g/dL — ABNORMAL LOW (ref 12.0–15.0)
Immature Granulocytes: 5 %
Lymphocytes Relative: 11 %
Lymphs Abs: 0.3 10*3/uL — ABNORMAL LOW (ref 0.7–4.0)
MCH: 30 pg (ref 26.0–34.0)
MCHC: 31.1 g/dL (ref 30.0–36.0)
MCV: 96.2 fL (ref 80.0–100.0)
Monocytes Absolute: 0.3 10*3/uL (ref 0.1–1.0)
Monocytes Relative: 10 %
Neutro Abs: 2.3 10*3/uL (ref 1.7–7.7)
Neutrophils Relative %: 74 %
Platelets: 43 10*3/uL — ABNORMAL LOW (ref 150–400)
RBC: 3.17 MIL/uL — ABNORMAL LOW (ref 3.87–5.11)
RDW: 18.6 % — ABNORMAL HIGH (ref 11.5–15.5)
WBC: 3.1 10*3/uL — ABNORMAL LOW (ref 4.0–10.5)
nRBC: 0 % (ref 0.0–0.2)

## 2021-12-19 LAB — BASIC METABOLIC PANEL
Anion gap: 6 (ref 5–15)
BUN: 12 mg/dL (ref 6–20)
CO2: 31 mmol/L (ref 22–32)
Calcium: 8.6 mg/dL — ABNORMAL LOW (ref 8.9–10.3)
Chloride: 103 mmol/L (ref 98–111)
Creatinine, Ser: 0.47 mg/dL (ref 0.44–1.00)
GFR, Estimated: >60 mL/min (ref >60)
Glucose, Bld: 125 mg/dL — ABNORMAL HIGH (ref 70–99)
Potassium: 4.2 mmol/L (ref 3.5–5.1)
Sodium: 140 mmol/L (ref 135–145)

## 2021-12-19 LAB — MAGNESIUM: Magnesium: 1.9 mg/dL (ref 1.7–2.4)

## 2021-12-19 MED ORDER — COPPER GLUCONATE 2 MG PO CAPS: 4.0000 mg | ORAL_CAPSULE | Freq: Every day | ORAL | Status: DC

## 2021-12-19 MED ORDER — SULFAMETHOXAZOLE-TRIMETHOPRIM 800-160 MG PO TABS
1.0000 | ORAL_TABLET | ORAL | Status: DC
  Administered 2021-12-20: 1 via ORAL
  Filled 2021-12-19: qty 1

## 2021-12-19 MED ORDER — PREDNISONE 20 MG PO TABS
50.0000 mg | ORAL_TABLET | Freq: Every day | ORAL | Status: DC
  Administered 2021-12-20: 50 mg via ORAL
  Filled 2021-12-19: qty 2

## 2021-12-19 MED ORDER — SACCHAROMYCES BOULARDII 250 MG PO CAPS
250.0000 mg | ORAL_CAPSULE | Freq: Two times a day (BID) | ORAL | Status: DC
  Administered 2021-12-19 – 2021-12-20 (×2): 250 mg via ORAL
  Filled 2021-12-19 (×2): qty 1

## 2021-12-19 NOTE — Progress Notes (Signed)
PHARMACIST - PHYSICIAN ORDER COMMUNICATION ? ?CONCERNING: P&T Medication Policy on Herbal Medications ? ?DESCRIPTION:  This patient?s order for:  copper gluconate has been noted. ? ?This product(s) is classified as an ?herbal? or natural product. ?Due to a lack of definitive safety studies or FDA approval, nonstandard manufacturing practices, plus the potential risk of unknown drug-drug interactions while on inpatient medications, the Pharmacy and Therapeutics Committee does not permit the use of ?herbal? or natural products of this type within [facility]. ?  ?ACTION TAKEN: ?The pharmacy department is unable to verify this order at this time and your patient has been informed of this safety policy. ?Please reevaluate patient?s clinical condition at discharge and address if the herbal or natural product(s) should be resumed at that time. ? ?Peggyann Juba, PharmD, BCPS ?Pharmacy: [phone] ?12/19/2021 3:26 PM ? ?

## 2021-12-19 NOTE — Plan of Care (Signed)
  Problem: Education: Goal: Knowledge of General Education information will improve Description Including pain rating scale, medication(s)/side effects and non-pharmacologic comfort measures Outcome: Progressing   

## 2021-12-19 NOTE — Progress Notes (Signed)
?PROGRESS NOTE ? ? ? ?Barbara Stanley  YSH:[id number] DOB: [birth year] DOA: 12/17/2021 ?PCP: [name], NP  ? ? ? ?Brief Narrative:  ? ?Barbara Stanley is a 51 y.o. female with medical history significant for depression/anxiety, gastric bypass,  myelodysplastic syndrome with chronic thrombocytopenia, tobacco abuse, COPD is admitted with acute COPD exacerbation/acute hypoxic respiratory failure ? ? ? ?Subjective: ? ?Reports feeling better, still has coughing spells and DOE, o2 drops into the 80's when going to the bathroom,  ?She is less congested, Right-sided pleuritic chest pain has resolved ?No fever ? ? ?Assessment & Plan: ? Principal Problem: ?  Acute exacerbation of chronic obstructive pulmonary disease (COPD) ([place]) ?Active Problems: ?  MDS (myelodysplastic syndrome), low grade ([place]) ?  Thrombocytopenia ([place]) ?  COPD (chronic obstructive pulmonary disease) ([place]) ?  Tobacco use disorder ?  Depression with anxiety ? ? ? ?Assessment and Plan: ? ?COPD exacerbation/acute hypoxic respiratory failure, baseline not home O2 dependent ?-  SPO2 was 85% on room air in triage , placed on 2LPM [place] , initially  BBS distant insp/exp wheezes, tripoding in bed,  ?-Chest x-ray no acute infiltrate ?-COVID and flu screening negative ?-She did report sick contact,  respiratory viral panel negative ?-Improving, taper  steroid, continue Zithromax , DuoNeb , add Mucinex and Flonase ? -wean oxygen as able, likely will need home O2 ? ?Lactic acidosis ?Likely due to hypoxia ?Normalized ?Blood culture in process ? ? ? ?MDS (myelodysplastic syndrome), low grade (HCC)/pancytopenia ?  Follows with hematology/oncology Dr. Lynn Ito with [place] [facility]. ?-Continue Promacta ?-Monitor CBC, no indication for transfusion currently, no sign of bleeding ? ? ? ?Depression with anxiety ?Continue home Effexor XR, BuSpar, trazodone. ? ?Tobacco use disorder ?Smoke about 5 cigarettes/day, down from 2 packs/day.  Patient declines nicotine  patch. ?She is determined to quit smoking ? ?Obesity: Body mass index is 38.43 kg/m?Marland KitchenMarland Kitchen ?History of gastric bypass surgery ? ? ? ?I have Reviewed nursing notes, Vitals, pain scores, I/o's, Lab results and  imaging results since pt's last encounter, details please see discussion above  ?I ordered the following labs:  ?Unresulted Labs (From admission, onward)  ? ? None  ? ?  ? ? ? ?DVT prophylaxis: SCDs Start: 12/17/21 2323 ? ? ?Code Status:   Code Status: Full Code ? ?Family Communication: Patient ?Disposition:  ? ?Dispo: The patient is from: Home ?             Anticipated d/c is to: Home ?             Anticipated d/c date is: likely on 3/6, likely will need home 02 ? ?Antimicrobials:   ?Anti-infectives (From admission, onward)  ? ? Start     Dose/Rate Route Frequency Ordered Stop  ? 12/18/21 1000  azithromycin (ZITHROMAX) tablet 500 mg       ? 500 mg Oral Daily 12/17/21 2325 12/22/21 0959  ? 12/17/21 1745  cefTRIAXone (ROCEPHIN) 2 g in sodium chloride 0.9 % 100 mL IVPB  Status:  Discontinued       ? 2 g ?200 mL/hr over 30 Minutes Intravenous Every 24 hours 12/17/21 1733 12/17/21 2325  ? 12/17/21 1745  azithromycin (ZITHROMAX) 500 mg in sodium chloride 0.9 % 250 mL IVPB  Status:  Discontinued       ? 500 mg ?250 mL/hr over 60 Minutes Intravenous Every 24 hours 12/17/21 1733 12/17/21 2325  ? ?  ? ? ? ? ? ? ?Objective: ?Vitals:  ? 12/18/21 2122 12/19/21  8099 12/19/21 0805 12/19/21 8338  ?BP: 126/89 (!) 141/77    ?Pulse: 78 74    ?Resp: 20 20    ?Temp: 97.9 ?F (36.6 ?C) 98.1 ?F (36.7 ?C)    ?TempSrc:      ?SpO2: 95% 96% 95% 96%  ?Weight:      ?Height:      ? ? ?Intake/Output Summary (Last 24 hours) at 12/19/2021 1203 ?Last data filed at 12/19/2021 0854 ?Gross per 24 hour  ?Intake 1260 ml  ?Output --  ?Net 1260 ml  ? ?Filed Weights  ? 12/17/21 1717  ?Weight: 108 kg  ? ? ?Examination: ? ?General exam: AAOx3, sounds congested, does not appear in acute distress ?Respiratory system: still diminished, air movement slightly  improved ,no significant wheezing ,respiratory effort normal. ?Cardiovascular system:  RRR.  ?Gastrointestinal system: Abdomen is nondistended, soft and nontender.  Normal bowel sounds heard. ?Central nervous system: Alert and oriented. No focal neurological deficits. ?Extremities:  no edema ?Skin: No rashes, lesions or ulcers ?Psychiatry: Judgement and insight appear normal. Mood & affect appropriate.  ? ? ? ?Data Reviewed: I have personally reviewed  labs and visualized  imaging studies since the last encounter and formulate the plan  ? ? ? ? ? ? ?Scheduled Meds: ? azithromycin  500 mg Oral Daily  ? busPIRone  5 mg Oral TID  ? eltrombopag  50 mg Oral Daily  ? famotidine  20 mg Oral BID  ? fluticasone  2 spray Each Nare Daily  ? folic acid  1 mg Oral Daily  ? guaiFENesin  600 mg Oral BID  ? ipratropium-albuterol  3 mL Nebulization TID  ? mometasone-formoterol  2 puff Inhalation BID  ? pantoprazole  40 mg Oral Daily  ? [START ON 12/20/2021] predniSONE  50 mg Oral Q breakfast  ? traZODone  400 mg Oral QHS  ? venlafaxine XR  225 mg Oral Q breakfast  ? ?Continuous Infusions: ? ? LOS: 1 day  ? ? ? ? ?Florencia Reasons, MD PhD FACP ?[facility] ? ?Available via Epic secure chat 7am-7pm for nonurgent issues ?Please page for urgent issues ?To page the attending provider between 7A-7P or the covering provider during after hours 7P-7A, please log into the web site [email or web address] and access using universal [place] password for that web site. If you do not have the password, please call the hospital operator. ? ? ? ?12/19/2021, 12:03 PM  ? ? ?

## 2021-12-20 ENCOUNTER — Other Ambulatory Visit (HOSPITAL_COMMUNITY): Payer: Self-pay

## 2021-12-20 DIAGNOSIS — D4620 Refractory anemia with excess of blasts, unspecified: Secondary | ICD-10-CM | POA: Diagnosis not present

## 2021-12-20 DIAGNOSIS — F418 Other specified anxiety disorders: Secondary | ICD-10-CM

## 2021-12-20 DIAGNOSIS — J441 Chronic obstructive pulmonary disease with (acute) exacerbation: Secondary | ICD-10-CM | POA: Diagnosis not present

## 2021-12-20 DIAGNOSIS — F17200 Nicotine dependence, unspecified, uncomplicated: Secondary | ICD-10-CM

## 2021-12-20 MED ORDER — GUAIFENESIN ER 600 MG PO TB12
600.0000 mg | ORAL_TABLET | Freq: Two times a day (BID) | ORAL | 0 refills | Status: AC
  Filled 2021-12-20: qty 30, 15d supply, fill #0

## 2021-12-20 MED ORDER — ALBUTEROL SULFATE HFA 108 (90 BASE) MCG/ACT IN AERS
1.0000 | INHALATION_SPRAY | Freq: Four times a day (QID) | RESPIRATORY_TRACT | 0 refills | Status: AC | PRN
  Filled 2021-12-20: qty 18, 30d supply, fill #0

## 2021-12-20 MED ORDER — PREDNISONE 20 MG PO TABS
20.0000 mg | ORAL_TABLET | Freq: Every day | ORAL | 0 refills | Status: AC
  Filled 2021-12-20: qty 3, 3d supply, fill #0

## 2021-12-20 MED ORDER — AZITHROMYCIN 500 MG PO TABS
500.0000 mg | ORAL_TABLET | Freq: Every day | ORAL | 0 refills | Status: AC
  Filled 2021-12-20: qty 3, 3d supply, fill #0

## 2021-12-20 MED ORDER — IPRATROPIUM-ALBUTEROL 0.5-2.5 (3) MG/3ML IN SOLN
3.0000 mL | Freq: Four times a day (QID) | RESPIRATORY_TRACT | 0 refills | Status: AC | PRN
  Filled 2021-12-20: qty 90, 8d supply, fill #0

## 2021-12-20 NOTE — TOC Transition Note (Signed)
Transition of Care (TOC) - CM/SW Discharge Note ? ? ?Patient Details  ?Name: Barbara Stanley ?MRN: [id number] ?Date of Birth: 09-05-1971 ? ?Transition of Care (TOC) CM/SW Contact:  ?Cherita Hebel, LCSW ?Phone Number: ?12/20/2021, 1:14 PM ? ? ?Clinical Narrative:    ?Orders placed for home O2 and home nebulizer machine.  Met with pt who is agreeable and has NO agency preference.  Referral placed with [employer] and portable tank delivered to pt's room with concentrator and neb machine to be delivered to the home.  No further TOC needs. ? ? ?Final next level of care: Home/Self Care ?Barriers to Discharge: Barriers Resolved ? ? ?Patient Goals and CMS Choice ?Patient states their goals for this hospitalization and ongoing recovery are:: return home ?  ?  ? ?Discharge Placement ?  ?           ?  ?  ?  ?  ? ?Discharge Plan and Services ?  ?  ?           ?DME Arranged: Nebulizer machine, Oxygen ?DME Agency: [employer] ?Date DME Agency Contacted: 12/20/21 ?Time DME Agency Contacted: [phone] ?Representative spoke with at DME Agency: Brenton Grills ?  ?  ?  ?  ?  ? ?Social Determinants of Health (SDOH) Interventions ?  ? ? ?Readmission Risk Interventions ?Readmission Risk Prevention Plan 12/20/2021  ?Transportation Screening Complete  ?PCP or Specialist Appt within 5-7 Days Complete  ?Home Care Screening Complete  ?Some recent data might be hidden  ? ? ? ? ? ?

## 2021-12-20 NOTE — Discharge Summary (Signed)
Physician Discharge Summary   Patient: Barbara Stanley MRN: [id number] DOB: [birth year]  Admit date:     12/17/2021  Discharge date: 12/20/21  Discharge Physician: Jimmy Picket Shyonna Carlin   PCP: [name], NP   Recommendations at discharge:    Patient will resume rescue bronchodilator therapy Continue with prednisone and azithromycin for 3 more days. She will have a home 02 screen before her discharge.   Discharge Diagnoses: Principal Problem:   Acute exacerbation of chronic obstructive pulmonary disease (COPD) ([place]) Active Problems:   MDS (myelodysplastic syndrome), low grade (HCC)   Tobacco use disorder   Depression with anxiety  Resolved Problems:   * No resolved hospital problems. [facility] Course: Barbara Stanley was admitted to the hospital with the working diagnosis of COPD exacerbation.   Barbara Stanley is a 51 y.o. female with medical history significant for COPD, myelodysplastic syndrome with chronic thrombocytopenia, depression/anxiety, and tobacco use who presented with dyspnea. Reported 2 days of worsening dyspnea, associated with increase phlegm and cough. Positive pleuritic chest pain. At home not on rescue bronchodilator therapy due to insurance issues. On her initial physical examination her blood pressure was 130/83, HR 116, RR 20 and 02 saturation 92% to 85% on room air, temp 99.9, lungs with distant breath sounds with no wheezing or rhonchi, heart with S1 and S2 present and rhythmic, abdomen soft and no lower extremity edema.   Na 134. K 3,4 CL 98, bicarbonate 25, glucose 107, bun 10 and cr at 0,67.  Wbc 6,2, hgb 10,5 hct 32,7 and plt 43.  Sars covid 19 negative  Viral panel negative   Chest radiograph with hyperinflation, bilateral atelectasis at bases.  EKG 113 bpm, normal axis, normal intervals, sinus rhythm, with no significant ST segment or T wave changes.   Patient was placed on aggressive bronchodilator therapy and systemic steroids with improvement  of her symptoms.      Assessment and Plan: * Acute exacerbation of chronic obstructive pulmonary disease (COPD) ([place]) Patient was admitted to the medical ward, she was placed on supplemental 0-2 per [place], along with aggressive bronchodilator therapy. Systemic corticosteroids and airway clearing techniques.  At the time of her discharge her symptoms have improved. She will have ambulatory oxymetry and continue with systemic steroids for 3 more days.  Continue azithromycin for 3 more days. New prescription for nebulizer machine and rescue bronchodilator therapy.   MDS (myelodysplastic syndrome), low grade (HCC) Pancytopenia.  Follows with hematology/oncology Dr. Lynn Ito with [facility]   Her cell count remained stable, at the time of her discharge wbc was 3,1 hgb 9,5, hct 30,5 and plt 43. Plan to follow up as outpatient.   Thrombocytopenia ([place]) Discharge plt 43, follow up as outpatient.   Tobacco use disorder Smoke about 5 cigarettes/day, down from 2 packs/day.  Patient declined nicotine patch. Continue smoking cessation counseling.   Depression with anxiety Continue home Effexor XR, BuSpar, trazodone.         Consultants: none  Procedures performed: none   Disposition: Home Diet recommendation:  Cardiac diet DISCHARGE MEDICATION: Allergies as of 12/20/2021       Reactions   Other    "Banana Bag" given to patient at physician's office, severe back pain, SOB   Nicotine Rash   Nicotine patch        Medication List     TAKE these medications    Accu-Chek Softclix Lancets lancets USE AS DIRECTED USE TWO TIMES A DAY What changed:  how much to take when to take this additional instructions   acetaminophen 500 MG tablet Commonly known as: TYLENOL Take 1,000 mg by mouth daily as needed for moderate pain.   Advair HFA 230-21 MCG/ACT inhaler Generic drug: fluticasone-salmeterol Inhale 2 puffs into the lungs 2 (two) times  daily.   albuterol 108 (90 Base) MCG/ACT inhaler Commonly known as: VENTOLIN HFA Inhale 1-2 puffs into the lungs every 6 (six) hours as needed for wheezing or shortness of breath. What changed: Another medication with the same name was removed. Continue taking this medication, and follow the directions you see here.   azithromycin 500 MG tablet Commonly known as: ZITHROMAX Take 1 tablet (500 mg total) by mouth daily for 3 days.   busPIRone 5 MG tablet Commonly known as: BUSPAR TAKE 1 TABLET(5 MG) BY MOUTH THREE TIMES DAILY What changed: See the new instructions.   cholecalciferol 25 MCG (1000 UNIT) tablet Commonly known as: VITAMIN D3 Take 5,000 Units by mouth daily.   colestipol 1 g tablet Commonly known as: COLESTID TAKE 2 TABLETS(2 GRAMS) BY MOUTH TWICE DAILY What changed: See the new instructions.   Copper Gluconate 2 MG Caps Take 4 mg by mouth daily.   diclofenac sodium 1 % Gel Commonly known as: VOLTAREN Apply 2 g topically 4 (four) times daily. What changed:  when to take this reasons to take this   dicyclomine 20 MG tablet Commonly known as: BENTYL Take 1 tablet (20 mg total) by mouth in the morning, at noon, in the evening, and at bedtime. What changed:  when to take this reasons to take this   eltrombopag 50 MG tablet Commonly known as: PROMACTA Take 50 mg by mouth daily.   esomeprazole 40 MG capsule Commonly known as: NEXIUM Take 1 capsule (40 mg total) by mouth 2 (two) times daily.   famotidine 20 MG tablet Commonly known as: PEPCID Take 1 tablet (20 mg total) by mouth 2 (two) times daily.   folic acid 1 MG tablet Commonly known as: FOLVITE TAKE 1 TABLET(1 MG) BY MOUTH DAILY What changed: See the new instructions.   guaiFENesin 600 MG 12 hr tablet Commonly known as: MUCINEX Take 1 tablet (600 mg total) by mouth 2 (two) times daily for 15 days.   ipratropium-albuterol 0.5-2.5 (3) MG/3ML Soln Commonly known as: DUONEB Take 3 mLs by  nebulization every 6 (six) hours as needed.   nicotine polacrilex 4 MG lozenge Commonly known as: Commit Take 1 lozenge (4 mg total) by mouth as needed for smoking cessation.   predniSONE 20 MG tablet Commonly known as: DELTASONE Take 1 tablet (20 mg total) by mouth daily with breakfast for 3 days. Start taking on: December 21, 2021   promethazine 25 MG tablet Commonly known as: PHENERGAN Take 1 tablet (25 mg total) by mouth every 6 (six) hours as needed for refractory nausea / vomiting.   sulfamethoxazole-trimethoprim 800-160 MG tablet Commonly known as: BACTRIM DS Take 1 tablet by mouth See admin instructions. Monday, Wednesday, Friday   traZODone 150 MG tablet Commonly known as: DESYREL Take 1 tablet (150 mg total) by mouth at bedtime. For sleep What changed: how much to take   venlafaxine XR 75 MG 24 hr capsule Commonly known as: EFFEXOR-XR Take 1 capsule (75 mg total) by mouth daily with breakfast. For depression What changed: how much to take               Durable Medical Equipment  (From admission, onward)  Start     Ordered   12/20/21 0000  For home use only DME Nebulizer machine       Question Answer Comment  Patient needs a nebulizer to treat with the following condition COPD (chronic obstructive pulmonary disease) ([place])   Length of Need 6 Months      12/20/21 1108            Discharge Exam: Filed Weights   12/17/21 1717  Weight: 108 kg   BP 128/74 (BP Location: Right Arm)    Pulse 87    Temp 98.3 F (36.8 C) (Oral)    Resp 18    Ht '5\' 6"'$  (1.676 m)    Wt 108 kg    LMP  (LMP Unknown)    SpO2 93%    BMI 38.43 kg/m   Neurology awake and alert ENT with no pallor Cardiovascular S1 and S2 present and rhythmic with no gallops or murmurs No JVD No lower extremity edema Respiratory with prolonged expiratory phase but no wheezing, rales or rhonchi Abdomen soft and non tender.   Condition at discharge: stable  The results of significant  diagnostics from this hospitalization (including imaging, microbiology, ancillary and laboratory) are listed below for reference.   Imaging Studies: DG Chest Port 1 View  Result Date: 12/17/2021 CLINICAL DATA:  Worsening shortness of breath since yesterday, productive cough EXAM: PORTABLE CHEST 1 VIEW COMPARISON:  07/27/2018 FINDINGS: The heart size and mediastinal contours are within normal limits. Both lungs are clear. The visualized skeletal structures are unremarkable. IMPRESSION: No active disease. Electronically Signed   By: Randa Ngo M.D.   On: 12/17/2021 17:46    Microbiology: Results for orders placed or performed during the hospital encounter of 12/17/21  Resp Panel by RT-PCR (Flu A&B, Covid) Nasopharyngeal Swab     Status: None   Collection Time: 12/17/21  5:21 PM   Specimen: Nasopharyngeal Swab; Nasopharyngeal(NP) swabs in vial transport medium  Result Value Ref Range Status   SARS Coronavirus 2 by RT PCR NEGATIVE NEGATIVE Final    Comment: (NOTE) SARS-CoV-2 target nucleic acids are NOT DETECTED.  The SARS-CoV-2 RNA is generally detectable in upper respiratory specimens during the acute phase of infection. The lowest concentration of SARS-CoV-2 viral copies this assay can detect is 138 copies/mL. A negative result does not preclude SARS-Cov-2 infection and should not be used as the sole basis for treatment or other patient management decisions. A negative result may occur with  improper specimen collection/handling, submission of specimen other than nasopharyngeal swab, presence of viral mutation(s) within the areas targeted by this assay, and inadequate number of viral copies(<138 copies/mL). A negative result must be combined with clinical observations, patient history, and epidemiological information. The expected result is Negative.  Fact Sheet for Patients:  EntrepreneurPulse.com.au  Fact Sheet for Healthcare Providers:   IncredibleEmployment.be  This test is no t yet approved or cleared by the [place] FDA and  has been authorized for detection and/or diagnosis of SARS-CoV-2 by FDA under an Emergency Use Authorization (EUA). This EUA will remain  in effect (meaning this test can be used) for the duration of the COVID-19 declaration under Section 564(b)(1) of the Act, 21 U.S.C.section 360bbb-3(b)(1), unless the authorization is terminated  or revoked sooner.       Influenza A by PCR NEGATIVE NEGATIVE Final   Influenza B by PCR NEGATIVE NEGATIVE Final    Comment: (NOTE) The Xpert Xpress SARS-CoV-2/FLU/RSV plus assay is intended as an aid in the diagnosis  of influenza from Nasopharyngeal swab specimens and should not be used as a sole basis for treatment. Nasal washings and aspirates are unacceptable for Xpert Xpress SARS-CoV-2/FLU/RSV testing.  Fact Sheet for Patients: EntrepreneurPulse.com.au  Fact Sheet for Healthcare Providers: IncredibleEmployment.be  This test is not yet approved or cleared by the [place] FDA and has been authorized for detection and/or diagnosis of SARS-CoV-2 by FDA under an Emergency Use Authorization (EUA). This EUA will remain in effect (meaning this test can be used) for the duration of the COVID-19 declaration under Section 564(b)(1) of the Act, 21 U.S.C. section 360bbb-3(b)(1), unless the authorization is terminated or revoked.  Performed at [facility], [address] [place], [place] [address]   Blood Culture (routine x 2)     Status: None (Preliminary result)   Collection Time: 12/17/21  5:54 PM   Specimen: BLOOD LEFT HAND  Result Value Ref Range Status   Specimen Description   Final    BLOOD LEFT HAND BLOOD Performed at [facility], [address] [place], [place] [address]    Special Requests   Final    Blood Culture adequate volume BOTTLES DRAWN AEROBIC AND  ANAEROBIC Performed at [facility], [address] [place], [place] [address]    Culture   Final    NO GROWTH 3 DAYS Performed at [facility], [place] [address] [place], [place] [address]    Report Status PENDING  Incomplete  Blood Culture (routine x 2)     Status: None (Preliminary result)   Collection Time: 12/17/21  5:54 PM   Specimen: Left Antecubital; Blood  Result Value Ref Range Status   Specimen Description   Final    LEFT ANTECUBITAL BLOOD Performed at [facility], [address] [place], [place] [address]    Special Requests   Final    Blood Culture adequate volume BOTTLES DRAWN AEROBIC AND ANAEROBIC Performed at [facility], [address], [place], [place] [address]    Culture   Final    NO GROWTH 3 DAYS Performed at [facility], [place] [address] [place], [place] [address]    Report Status PENDING  Incomplete  Respiratory (~20 pathogens) panel by PCR     Status: None   Collection Time: 12/18/21 11:04 AM   Specimen: Nasopharyngeal Swab; Respiratory  Result Value Ref Range Status   Adenovirus NOT DETECTED NOT DETECTED Final   Coronavirus 229E NOT DETECTED NOT DETECTED Final    Comment: (NOTE) The Coronavirus on the Respiratory Panel, DOES NOT test for the novel  Coronavirus (2019 nCoV)    Coronavirus HKU1 NOT DETECTED NOT DETECTED Final   Coronavirus NL63 NOT DETECTED NOT DETECTED Final   Coronavirus OC43 NOT DETECTED NOT DETECTED Final   Metapneumovirus NOT DETECTED NOT DETECTED Final   Rhinovirus / Enterovirus NOT DETECTED NOT DETECTED Final   Influenza A NOT DETECTED NOT DETECTED Final   Influenza B NOT DETECTED NOT DETECTED Final   Parainfluenza Virus 1 NOT DETECTED NOT DETECTED Final   Parainfluenza Virus 2 NOT DETECTED NOT DETECTED Final   Parainfluenza Virus 3 NOT DETECTED NOT DETECTED Final   Parainfluenza Virus 4 NOT DETECTED NOT DETECTED Final   Respiratory Syncytial Virus NOT DETECTED NOT DETECTED  Final   Bordetella pertussis NOT DETECTED NOT DETECTED Final   Bordetella Parapertussis NOT DETECTED NOT DETECTED Final   Chlamydophila pneumoniae NOT DETECTED NOT DETECTED Final  Mycoplasma pneumoniae NOT DETECTED NOT DETECTED Final    Comment: Performed at [facility], [place] [address] [place], [place] [address]    Labs: CBC: Recent Labs  Lab 12/17/21 1745 12/17/21 1802 12/18/21 0313 12/19/21 0257  WBC 6.2  --  3.2* 3.1*  NEUTROABS 4.6  --   --  2.3  HGB 10.5* 10.2* 9.0* 9.5*  HCT 32.7* 30.0* 29.3* 30.5*  MCV 93.2  --  95.1 96.2  PLT 43*  --  38* 43*   Basic Metabolic Panel: Recent Labs  Lab 12/17/21 1745 12/17/21 1802 12/18/21 0313 12/19/21 0257  NA 134* 135 135 140  K 3.4* 3.4* 3.5 4.2  CL 98  --  100 103  CO2 25  --  28 31  GLUCOSE 107*  --  171* 125*  BUN 10  --  9 12  CREATININE 0.67  --  0.55 0.47  CALCIUM 8.4*  --  8.2* 8.6*  MG  --   --   --  1.9   Liver Function Tests: Recent Labs  Lab 12/17/21 1745  AST 14*  ALT 11  ALKPHOS 80  BILITOT 0.8  PROT 6.4*  ALBUMIN 3.3*   CBG: No results for input(s): GLUCAP in the last 168 hours.  Discharge time spent: greater than 30 minutes.  Signed: Tawni Millers, MD [facility] 12/20/2021

## 2021-12-20 NOTE — Plan of Care (Signed)

## 2021-12-20 NOTE — Progress Notes (Signed)
SATURATION QUALIFICATIONS: (This note is used to comply with regulatory documentation for home oxygen) ? ?Patient Saturations on Room Air at Rest = 88% ? ?Patient Saturations on Room Air while Ambulating = 88% ? ?Patient Saturations on 2 Liters of oxygen while Ambulating = 95% ? ?Please briefly explain why patient needs home oxygen:  patient desats on exertion whi walking D [place] Flow RN ?

## 2021-12-21 ENCOUNTER — Ambulatory Visit: Payer: Medicaid Other

## 2021-12-22 ENCOUNTER — Other Ambulatory Visit (HOSPITAL_COMMUNITY): Payer: Self-pay

## 2021-12-22 LAB — CULTURE, BLOOD (ROUTINE X 2)
Culture: NO GROWTH
Culture: NO GROWTH
Special Requests: ADEQUATE
Special Requests: ADEQUATE

## 2021-12-28 ENCOUNTER — Ambulatory Visit: Payer: Medicaid Other | Attending: Nurse Practitioner

## 2021-12-28 ENCOUNTER — Other Ambulatory Visit: Payer: Self-pay

## 2021-12-28 DIAGNOSIS — G8929 Other chronic pain: Secondary | ICD-10-CM | POA: Diagnosis present

## 2021-12-28 DIAGNOSIS — M6281 Muscle weakness (generalized): Secondary | ICD-10-CM | POA: Insufficient documentation

## 2021-12-28 DIAGNOSIS — M25511 Pain in right shoulder: Secondary | ICD-10-CM | POA: Insufficient documentation

## 2021-12-28 DIAGNOSIS — M542 Cervicalgia: Secondary | ICD-10-CM | POA: Diagnosis present

## 2021-12-28 NOTE — Therapy (Signed)
?OUTPATIENT PHYSICAL THERAPY TREATMENT NOTE ? ? ?Patient Name: Barbara Stanley ?MRN: [id number] ?DOB:Jun 17, 1971, 51 y.o., female ?Today's Date: 12/28/2021 ? ?PCP: [name], NP ?REFERRING PROVIDER: [name], NP ? ? PT End of Session - 12/28/21 1652   ? ? Visit Number 5   ? Number of Visits 15   ? Date for PT Re-Evaluation 12/28/21   ? Authorization Type [place] [facility]   ? Authorization Time Period Submitted for 8 PT visits 11/24/2021-01/21/2022   ? PT Start Time 1700   ? PT Stop Time 1730   ? PT Time Calculation (min) 30 min   ? Activity Tolerance Patient tolerated treatment well   ? Behavior During Therapy [facility] for tasks assessed/performed   ? ?  ?  ? ?  ? ? ? ? ? ?Past Medical History:  ?Diagnosis Date  ? Abdominal pain   ? Anemia   ? Anxiety   ? Bronchitis   ? COPD (chronic obstructive pulmonary disease) ([place])   ? Depression   ? bipolar  ? Diarrhea   ? Emphysema   ? GERD (gastroesophageal reflux disease)   ? History of blood transfusion   ? MDS (myelodysplastic syndrome) ([place])   ? Splenomegaly   ? r/t low platelets  ? Thrombocytopenia ([place])   ? Tobacco abuse   ? ?Past Surgical History:  ?Procedure Laterality Date  ? ABDOMINAL HYSTERECTOMY    ? c section x 3     ? CHOLECYSTECTOMY    ? GASTRIC BYPASS    ? UPPER GASTROINTESTINAL ENDOSCOPY    ? WISDOM TOOTH EXTRACTION    ? ?Patient Active Problem List  ? Diagnosis Date Noted  ? Acute exacerbation of chronic obstructive pulmonary disease (COPD) ([place]) 12/17/2021  ? Malnutrition, unspecified type ([place]) 04/01/2021  ? Former smoker 02/09/2021  ? Healthcare maintenance 10/12/2020  ? Iron deficiency anemia 11/12/2019  ? Splenomegaly, congestive, chronic 09/05/2019  ? Chronic ITP (idiopathic thrombocytopenia) ([facility]) 08/28/2019  ? Urinary incontinence, mixed 08/13/2019  ? MDS (myelodysplastic syndrome), low grade ([place]) 07/26/2019  ? Vitamin D deficiency 10/08/2018  ? Vitamin B12 deficiency due to intestinal malabsorption 10/08/2018  ? 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 Dietary  copper deficiency 09/27/2018  ? Other pancytopenia ([place]) 09/22/2018  ? Thrombocytopenia ([place]) 08/08/2018  ? MDD (major depressive disorder), single episode, severe with psychosis ([place]) 11/05/2014  ? PTSD (post-traumatic stress disorder) 11/05/2014  ? Depressive disorder 06/04/2014  ? Depression with anxiety 06/04/2014  ? Major depression, recurrent ([place]) 03/21/2014  ? Suicide attempt ([place]) 03/21/2014  ? Suicidal ideations 03/21/2014  ? MDD (major depressive disorder) 03/21/2014  ? Nonspecific abnormal unspecified cardiovascular function study 09/30/2013  ? Tobacco use disorder 09/30/2013  ? COPD (chronic obstructive pulmonary disease) ([place])   ? GERD (gastroesophageal reflux disease)   ? Anemia   ? ? ?REFERRING DIAG: M25.511,G89.29 (ICD-10-CM) - Chronic right shoulder pain ? ?THERAPY DIAG:  ?Chronic right shoulder pain ? ?Cervicalgia ? ?Muscle weakness (generalized) ? ?PERTINENT HISTORY: Year long hx of neck and R shoulder pain  ? ?PRECAUTIONS: None ? ?SUBJECTIVE:  ?Pt presents to PT with no current reports of neck pain or discomfort. Pt was recently hospitalized for COPD exacerbation, is now on 2L of oxygen and states she is feeling much better. Pt is ready to begin PT at this time.  ? ?Pain: ?Are you having pain? Yes ?NPRS: 2/10 ?Pain Location: neck, bilateral upper traps ?Pain Frequency/Description: intermittent  ?Aggravating Factors: driving ?Relieving Factors: medications ? ? ? ?OBJECTIVE:  ?OUTCOME: ?  QuickDASH: 41% disability ? ? UPPER EXTREMITY AROM/PROM: ?  ?A/PROM Right ?11/02/2021 Left ?11/02/2021  ?Shoulder flexion 140 p! WFL  ?Shoulder extension      ?Shoulder abduction 130 p! WFL  ?Shoulder adduction      ?Shoulder internal rotation T12 p! WFL  ?Shoulder external rotation 90 WFL  ?(Blank rows = not tested) ?  ?UPPER EXTREMITY MMT: ?  ?MMT Right ?11/02/2021 Left ?11/02/2021  ?Shoulder flexion 3+/5 p! 5/5  ?Shoulder abduction (C5) 3+/5 p! 5/5  ?Shoulder ER 3+/5 p! 5/5  ?Shoulder IR 3+/5 p! 5/5  ?  ?(Blank rows  = not tested, score listed is out of 5 possible points.  N = WNL, D = diminished, C = clear for gross weakness with myotome testing, * = concordant pain with testing)  ? ?  ?TODAY'S TREATMENT:  ?Therapeutic Exercise: ?Row 3x12 GTB ?Supine chin tuck 2x10 - 3" hold ?Manual Therapy: ?Trigger point release to bilateral upper trap ?Positional release to bilateral upper trap ?Suboccipital release ?  ?  ?PATIENT EDUCATION: ?Education details: new HEP ?Person educated: Patient ?Education method: Explanation, Demonstration, and Handouts ?Education comprehension: verbalized understanding and returned demonstration ?  ?  ?HOME EXERCISE PROGRAM: ?Access Code: [email or web address] ?URL: [email or web address] ?Date: 12/14/2021 ?Prepared by: Octavio Manns ? ?Exercises ?Seated Upper Trapezius Stretch - 1 x daily - 7 x weekly - 3 reps - 30 sec hold ?Seated Levator Scapulae Stretch - 1 x daily - 7 x weekly - 3 reps - 30 sec hold ?Standing Shoulder Row with Anchored Resistance - 1 x daily - 7 x weekly - 3 sets - 12 reps - 3 sec hold ?Seated Scapular Retraction - 1 x daily - 7 x weekly - 2 sets - 10 reps - 3 sec hold ?Standing Isometric Shoulder Internal Rotation with Towel Roll at Doorway - 1 x daily - 7 x weekly - 3 sets - 10 reps - 5 sec hold ?Standing Isometric Shoulder External Rotation with Doorway and Towel Roll - 1 x daily - 7 x weekly - 3 sets - 10 reps - 5 sec hold ? ? ?  ?ASSESSMENT: ?Pt was able to complete prescribed exercises and responded well to therapy. Noted decreased tightness post manual therapy interventions. Pt is progressing as expected with therapy, will assess goals and discharge readiness at next session.  ?  ?GOALS: ?Goals reviewed with patient? No ?  ?SHORT TERM GOALS: ?  ?STG Name Target Date Goal status  ?1 Pt will be compliant and knowledgeable with initial HEP for improved comfort and carryover ?Baseline: initial HEP given 11/23/2021 MET  ?2 Pt will self report R neck/shoulder pain no greater than 5/10  for improved comfort and functional ability  ?Baseline: 9/10 at worst 11/23/2021 MET  ?  ?LONG TERM GOALS:  ?  ?LTG Name Target Date Goal status  ?1 Pt will decrease Quick DASH disability score to no greater than 25% as proxy for functional improvement ?[place]: 43% disability  ?11/24/2021: 41% disability 12/28/2021 ONGOING  ?2 Pt will self report R neck/shoulder pain no greater than 3/10 for improved comfort and functional ability ?Baseline: 9/10 at worst 12/28/2021 ONGOING  ?3 Pt will improve R shoulder MMT to no less than 4/5 for all tested motions for decreased pain and improved function ?Baseline:  ?MMT Right ?11/02/2021 Left ?11/02/2021  ?Shoulder flexion 3+/5 p! 5/5  ?Shoulder abduction (C5) 3+/5 p! 5/5  ?Shoulder ER 3+/5 p! 5/5  ?Shoulder IR 3+/5 p! 5/5  ? 12/28/2021 ONGOING  ?  ?PLAN: ?PT  FREQUENCY: 1x/week ?  ?PT DURATION: 8 weeks ?  ?PLANNED INTERVENTIONS: Therapeutic exercises, Therapeutic activity, Neuro Muscular re-education, Balance training, Gait training, Patient/Family education, Joint mobilization, Dry Needling, Cryotherapy, Moist heat, and Manual therapy ?  ?PLAN FOR NEXT SESSION: assess HEP response; progress periscapular and RTC strengthening as able ? ? ? ?Ward Chatters, PT ?12/28/2021, 5:36 PM ? ?  ? ?

## 2021-12-30 ENCOUNTER — Encounter: Payer: Self-pay | Admitting: Nurse Practitioner

## 2021-12-30 ENCOUNTER — Ambulatory Visit (INDEPENDENT_AMBULATORY_CARE_PROVIDER_SITE_OTHER): Payer: Medicaid Other | Admitting: Nurse Practitioner

## 2021-12-30 ENCOUNTER — Other Ambulatory Visit: Payer: Self-pay

## 2021-12-30 VITALS — BP 131/65 | HR 87 | Temp 97.9°F | Ht 66.0 in | Wt 224.8 lb

## 2021-12-30 DIAGNOSIS — Z13220 Encounter for screening for lipoid disorders: Secondary | ICD-10-CM | POA: Diagnosis not present

## 2021-12-30 DIAGNOSIS — Z09 Encounter for follow-up examination after completed treatment for conditions other than malignant neoplasm: Secondary | ICD-10-CM | POA: Diagnosis not present

## 2021-12-30 NOTE — Progress Notes (Signed)
? ?[facility] ?[place], [place]  [address] ?Phone:  [phone]   Fax:  [phone] ? ? ?Established Patient Office Visit ? ?Subjective:  ?Patient ID: Barbara Stanley, female    DOB: 15-Oct-1971  Age: 51 y.o. MRN: [id number] ? ?CC:  ?Chief Complaint  ?Patient presents with  ? Hospitalization Follow-up  ?  Patient is here today for her hospital follow up. ?Patient was admitted to the hospital on 12/17/21 for COPD. ?  ? ? ?HPI ?Barbara Stanley presents for follow up. She  has a past medical history of Abdominal pain, Anemia, Anxiety, Bronchitis, COPD (chronic obstructive pulmonary disease) ([place]), Depression, Diarrhea, Emphysema, GERD (gastroesophageal reflux disease), History of blood transfusion, MDS (myelodysplastic syndrome) ([place]), Splenomegaly, Thrombocytopenia ([place]), and Tobacco abuse.  ? ?Patient is in today for hospital follow-up. Recently admitted for COPD. Hospital course of treatment was from 12/17/2021 to 3/6/202.  During hospital course she was placed on supplemental 0-2 per [place], along with aggressive bronchodilator therapy. ?Systemic corticosteroids and airway clearing techniques. ?        ?She is currently on 2 liters of oxygen. She feels like she is doing better overall. She  ?She has had zero nicotine since March 3.  ? ?She has March 21 for additional labs per treatment with oncology.  ?Past Medical History:  ?Diagnosis Date  ? Abdominal pain   ? Anemia   ? Anxiety   ? Bronchitis   ? COPD (chronic obstructive pulmonary disease) ([place])   ? Depression   ? bipolar  ? Diarrhea   ? Emphysema   ? GERD (gastroesophageal reflux disease)   ? History of blood transfusion   ? MDS (myelodysplastic syndrome) ([place])   ? Splenomegaly   ? r/t low platelets  ? Thrombocytopenia ([place])   ? Tobacco abuse   ? ? ?Past Surgical History:  ?Procedure Laterality Date  ? ABDOMINAL HYSTERECTOMY    ? c section x 3     ? CHOLECYSTECTOMY    ? GASTRIC BYPASS    ? UPPER GASTROINTESTINAL ENDOSCOPY    ? WISDOM  TOOTH EXTRACTION    ? ? ?Family History  ?Problem Relation Age of Onset  ? Diabetes Mother   ? Diabetes Other   ? Hypertension Other   ? CAD Other   ? Heart disease Father   ? Colon cancer Neg Hx   ? Rectal cancer Neg Hx   ? Stomach cancer Neg Hx   ? Esophageal cancer Neg Hx   ? ? ?Social History  ? ?Socioeconomic History  ? Marital status: Married  ?  Spouse name: Not on file  ? Number of children: 3  ? Years of education: Not on file  ? Highest education level: Not on file  ?Occupational History  ? Occupation: disabled  ?Tobacco Use  ? Smoking status: Former  ?  Packs/day: 0.50  ?  Years: 30.00  ?  Pack years: 15.00  ?  Types: Cigarettes  ?  Quit date: 01/31/2021  ?  Years since quitting: 0.9  ? Smokeless tobacco: Never  ? Tobacco comments:  ?  quit mid april 2022  ?Vaping Use  ? Vaping Use: Never used  ?Substance and Sexual Activity  ? Alcohol use: Not Currently  ?  Comment: occasional wine  ? Drug use: No  ? Sexual activity: Yes  ?  Birth control/protection: None  ?  Comment: Hysterectomy  ?Other Topics Concern  ? Not on file  ?Social History Narrative  ?  Not on file  ? ?Social Determinants of Health  ? ?Financial Resource Strain: Not on file  ?Food Insecurity: Not on file  ?Transportation Needs: Not on file  ?Physical Activity: Not on file  ?Stress: Not on file  ?Social Connections: Not on file  ?Intimate Partner Violence: Not on file  ? ? ?Outpatient Medications Prior to Visit  ?Medication Sig Dispense Refill  ? acetaminophen (TYLENOL) 500 MG tablet Take 1,000 mg by mouth daily as needed for moderate pain.    ? albuterol (VENTOLIN HFA) 108 (90 Base) MCG/ACT inhaler Inhale 1 - 2 puffs into the lungs every 6  hours as needed for wheezing or shortness of breath. 18 g 0  ? busPIRone (BUSPAR) 5 MG tablet TAKE 1 TABLET(5 MG) BY MOUTH THREE TIMES DAILY (Patient taking differently: Take 5 mg by mouth 3 (three) times daily.) 90 tablet 0  ? cholecalciferol (VITAMIN D3) 25 MCG (1000 UT) tablet Take 5,000 Units by mouth  daily.    ? Copper Gluconate 2 MG CAPS Take 4 mg by mouth daily.    ? diclofenac sodium (VOLTAREN) 1 % GEL Apply 2 g topically 4 (four) times daily. (Patient taking differently: Apply 2 g topically daily as needed (for pain).) 2 g 3  ? dicyclomine (BENTYL) 20 MG tablet Take 1 tablet (20 mg total) by mouth in the morning, at noon, in the evening, and at bedtime. (Patient taking differently: Take 20 mg by mouth 4 (four) times daily as needed for spasms.) 120 tablet 3  ? eltrombopag (PROMACTA) 50 MG tablet Take 50 mg by mouth daily.    ? esomeprazole (NEXIUM) 40 MG capsule Take 1 capsule (40 mg total) by mouth 2 (two) times daily. 180 capsule 3  ? famotidine (PEPCID) 20 MG tablet Take 1 tablet (20 mg total) by mouth 2 (two) times daily. 180 tablet 3  ? folic acid (FOLVITE) 1 MG tablet TAKE 1 TABLET(1 MG) BY MOUTH DAILY (Patient taking differently: Take 1 mg by mouth daily.) 90 tablet 2  ? ipratropium-albuterol (DUONEB) 0.5-2.5 (3) MG/3ML SOLN Take 3 mLs by nebulization every 6 hours as needed. 360 mL 0  ? promethazine (PHENERGAN) 25 MG tablet Take 1 tablet (25 mg total) by mouth every 6 (six) hours as needed for refractory nausea / vomiting. 30 tablet 0  ? sulfamethoxazole-trimethoprim (BACTRIM DS) 800-160 MG tablet Take 1 tablet by mouth See admin instructions. Monday, Wednesday, Friday    ? traZODone (DESYREL) 150 MG tablet Take 1 tablet (150 mg total) by mouth at bedtime. For sleep (Patient taking differently: Take 400 mg by mouth at bedtime. For sleep) 30 tablet 0  ? venlafaxine XR (EFFEXOR-XR) 75 MG 24 hr capsule Take 1 capsule (75 mg total) by mouth daily with breakfast. For depression (Patient taking differently: Take 225 mg by mouth daily with breakfast. For depression) 30 capsule 0  ? Accu-Chek Softclix Lancets lancets USE AS DIRECTED USE TWO TIMES A DAY (Patient not taking: Reported on 12/30/2021) 100 each 12  ? colestipol (COLESTID) 1 g tablet TAKE 2 TABLETS(2 GRAMS) BY MOUTH TWICE DAILY (Patient taking  differently: Take 2 g by mouth 2 (two) times daily.) 360 tablet 0  ? fluticasone-salmeterol (ADVAIR HFA) 230-21 MCG/ACT inhaler Inhale 2 puffs into the lungs 2 (two) times daily. (Patient not taking: Reported on 12/30/2021) 12 g 11  ? guaiFENesin (MUCINEX) 600 MG 12 hr tablet Take 1 tablet by mouth 2 times daily for 15 days. (Patient not taking: Reported on 12/30/2021) 30 tablet 0  ?  nicotine polacrilex (COMMIT) 4 MG lozenge Take 1 lozenge (4 mg total) by mouth as needed for smoking cessation. (Patient not taking: Reported on 12/30/2021) 108 tablet 3  ? ?No facility-administered medications prior to visit.  ? ? ?Allergies  ?Allergen Reactions  ? Other   ?  "Banana Bag" given to patient at physician's office, severe back pain, SOB  ? Nicotine Rash  ?  Nicotine patch  ? ? ?ROS ?Review of Systems  ?Constitutional:   ?     Appetite is good  ?Respiratory:    ?     On oxygen therapy  ?Cardiovascular:  Negative for chest pain.  ?Gastrointestinal:  Positive for diarrhea (no change increases with diet). Negative for constipation.  ?Genitourinary:  Positive for frequency (per patients desire).  ?Neurological:  Positive for dizziness. Negative for headaches.  ? ?  ?Objective:  ?  ?Physical Exam ?Constitutional:   ?   Appearance: She is obese.  ?HENT:  ?   Head: Normocephalic and atraumatic.  ?   Nose: Nose normal.  ?Cardiovascular:  ?   Rate and Rhythm: Normal rate and regular rhythm.  ?   Pulses: Normal pulses.  ?   Heart sounds: Normal heart sounds.  ?Pulmonary:  ?   Effort: Pulmonary effort is normal.  ?   Breath sounds: Normal breath sounds.  ?Abdominal:  ?   Palpations: Abdomen is soft.  ?   Comments: hypoactive  ?Musculoskeletal:     ?   General: Normal range of motion.  ?   Cervical back: Normal range of motion.  ?   Right lower leg: No edema.  ?   Left lower leg: No edema.  ?Skin: ?   General: Skin is warm and dry.  ?   Capillary Refill: Capillary refill takes less than 2 seconds.  ?   Coloration: Skin is pale.   ?Neurological:  ?   General: No focal deficit present.  ?   Mental Status: She is alert and oriented to person, place, and time.  ?Psychiatric:     ?   Mood and Affect: Mood normal.     ?   Behavior: Behavior normal.

## 2021-12-30 NOTE — Patient Instructions (Signed)

## 2021-12-31 LAB — LIPID PANEL
Chol/HDL Ratio: 2.7 ratio (ref 0.0–4.4)
Cholesterol, Total: 133 mg/dL (ref 100–199)
HDL: 49 mg/dL (ref >39)
LDL Chol Calc (NIH): 67 mg/dL (ref 0–99)
Triglycerides: 90 mg/dL (ref 0–149)
VLDL Cholesterol Cal: 17 mg/dL (ref 5–40)

## 2022-01-03 ENCOUNTER — Other Ambulatory Visit: Payer: Self-pay | Admitting: Gastroenterology

## 2022-01-03 ENCOUNTER — Other Ambulatory Visit: Payer: Self-pay

## 2022-01-03 ENCOUNTER — Ambulatory Visit (INDEPENDENT_AMBULATORY_CARE_PROVIDER_SITE_OTHER): Payer: Medicaid Other | Admitting: Primary Care

## 2022-01-03 ENCOUNTER — Encounter: Payer: Self-pay | Admitting: Primary Care

## 2022-01-03 DIAGNOSIS — Z8709 Personal history of other diseases of the respiratory system: Secondary | ICD-10-CM | POA: Insufficient documentation

## 2022-01-03 DIAGNOSIS — J449 Chronic obstructive pulmonary disease, unspecified: Secondary | ICD-10-CM | POA: Diagnosis not present

## 2022-01-03 DIAGNOSIS — Z87891 Personal history of nicotine dependence: Secondary | ICD-10-CM

## 2022-01-03 MED ORDER — SPIRIVA RESPIMAT 2.5 MCG/ACT IN AERS: 2.0000 | INHALATION_SPRAY | Freq: Every day | RESPIRATORY_TRACT | 5 refills | Status: DC

## 2022-01-03 MED ORDER — ADVAIR HFA 230-21 MCG/ACT IN AERO: 2.0000 | INHALATION_SPRAY | Freq: Two times a day (BID) | RESPIRATORY_TRACT | 5 refills | Status: DC

## 2022-01-03 NOTE — Assessment & Plan Note (Signed)
-   She has not had a cigarette since March 3rd, 2023. Encourage she maintain abstinence from smoking  ?

## 2022-01-03 NOTE — Telephone Encounter (Signed)
Yes we can say that she is on disability and can not work. She is followed by our office for COPD and requires supplemental oxygen.

## 2022-01-03 NOTE — Patient Instructions (Addendum)
Congratulations on quitting smoking!!! Key now to to maintain abstinence, do not touch or pick up a cigarette. If you need help or support reach out to [place]  ? ?Recommendations: ?- Continue Advair 230-74mg- two puffs morning and evening ?- Resume Spiriva Respimat 2.5cg- two puffs every morning ?- Use Duoneb every 6 hours only if having breakthrough shortness of breath/wheezing ?- Take mucinex 600-'1200mg'$  twice a day as needed for cough/chest congestion  ?- Continue to wear 2L supplement oxygen with heavy exertion to maintain O2 >88-92% and at bedtime  ? ?Orders: ?- Simple walk test in office on RA, if  <88% please titrate O2  ? ?Follow-up: ?- 3-4 months with Dr. IValeta Harms ? ?Chronic Obstructive Pulmonary Disease ?Chronic obstructive pulmonary disease (COPD) is a long-term (chronic) lung problem. When you have COPD, it is hard for air to get in and out of your lungs. ?Usually the condition gets worse over time, and your lungs will never return to normal. There are things you can do to keep yourself as healthy as possible. ?What are the causes? ?Smoking. This is the most common cause. ?Certain genes passed from parent to child (inherited). ?What increases the risk? ?Being exposed to secondhand smoke from cigarettes, pipes, or cigars. ?Being exposed to chemicals and other irritants, such as fumes and dust in the work environment. ?Having chronic lung conditions or infections. ?What are the signs or symptoms? ?Shortness of breath, especially during physical activity. ?A long-term cough with a large amount of thick mucus. Sometimes, the cough may not have any mucus (dry cough). ?Wheezing. ?Breathing quickly. ?Skin that looks gray or blue, especially in the fingers, toes, or lips. ?Feeling tired (fatigue). ?Weight loss. ?Chest tightness. ?Having infections often. ?Episodes when breathing symptoms become much worse (exacerbations). ?At the later stages of this disease, you may have swelling in the ankles, feet, or legs. ?How is  this treated? ?Taking medicines. ?Quitting smoking, if you smoke. ?Rehabilitation. This includes steps to make your body work better. It may involve a team of specialists. ?Doing exercises. ?Making changes to your diet. ?Using oxygen. ?Lung surgery. ?Lung transplant. ?Comfort measures (palliative care). ?Follow these instructions at home: ?Medicines ?Take over-the-counter and prescription medicines only as told by your doctor. ?Talk to your doctor before taking any cough or allergy medicines. You may need to avoid medicines that cause your lungs to be dry. ?Lifestyle ?If you smoke, stop smoking. Smoking makes the problem worse. ?Do not smoke or use any products that contain nicotine or tobacco. If you need help quitting, ask your doctor. ?Avoid being around things that make your breathing worse. This may include smoke, chemicals, and fumes. ?Stay active, but remember to rest as well. ?Learn and use tips on how to manage stress and control your breathing. ?Make sure you get enough sleep. Most adults need at least 7 hours of sleep every night. ?Eat healthy foods. Eat smaller meals more often. Rest before meals. ?Controlled breathing ?Learn and use tips on how to control your breathing as told by your doctor. Try: ?Breathing in (inhaling) through your nose for 1 second. Then, pucker your lips and breath out (exhale) through your lips for 2 seconds. ?Putting one hand on your belly (abdomen). Breathe in slowly through your nose for 1 second. Your hand on your belly should move out. Pucker your lips and breathe out slowly through your lips. Your hand on your belly should move in as you breathe out. ? ?Controlled coughing ?Learn and use controlled coughing to clear mucus  from your lungs. Follow these steps: ?Lean your head a little forward. ?Breathe in deeply. ?Try to hold your breath for 3 seconds. ?Keep your mouth slightly open while coughing 2 times. ?Spit any mucus out into a tissue. ?Rest and do the steps again 1 or 2  times as needed. ?General instructions ?Make sure you get all the shots (vaccines) that your doctor recommends. Ask your doctor about a flu shot and a pneumonia shot. ?Use oxygen therapy and pulmonary rehabilitation if told by your doctor. If you need home oxygen therapy, ask your doctor if you should buy a tool to measure your oxygen level (oximeter). ?Make a COPD action plan with your doctor. This helps you to know what to do if you feel worse than usual. ?Manage any other conditions you have as told by your doctor. ?Avoid going outside when it is very hot, cold, or humid. ?Avoid people who have a sickness you can catch (contagious). ?Keep all follow-up visits. ?Contact a doctor if: ?You cough up more mucus than usual. ?There is a change in the color or thickness of the mucus. ?It is harder to breathe than usual. ?Your breathing is faster than usual. ?You have trouble sleeping. ?You need to use your medicines more often than usual. ?You have trouble doing your normal activities such as getting dressed or walking around the house. ?Get help right away if: ?You have shortness of breath while resting. ?You have shortness of breath that stops you from: ?Being able to talk. ?Doing normal activities. ?Your chest hurts for longer than 5 minutes. ?Your skin color is more blue than usual. ?Your pulse oximeter shows that you have low oxygen for longer than 5 minutes. ?You have a fever. ?You feel too tired to breathe normally. ?These symptoms may represent a serious problem that is an emergency. Do not wait to see if the symptoms will go away. Get medical help right away. Call your local emergency services (911 in the [place] Do not drive yourself to the hospital. ?Summary ?Chronic obstructive pulmonary disease (COPD) is a long-term lung problem. ?The way your lungs work will never return to normal. Usually the condition gets worse over time. There are things you can do to keep yourself as healthy as possible. ?Take  over-the-counter and prescription medicines only as told by your doctor. ?If you smoke, stop. Smoking makes the problem worse. ?This information is not intended to replace advice given to you by your health care provider. Make sure you discuss any questions you have with your health care provider. ?Document Revised: 08/11/2020 Document Reviewed: 08/11/2020 ?Elsevier Patient Education ? 2022 [place]. ? ? ?Home Oxygen Use, Adult ?When a medical condition keeps you from getting enough oxygen, your health care provider may instruct you to take extra oxygen at home. Your health care provider will let you know: ?When to take oxygen. ?How long to take oxygen. ?How quickly oxygen should be delivered (flow rate), in liters per minute (LPM or L/M). ?Home oxygen can be given through: ?A mask. ?A nasal cannula. This is a device or tube that goes in the nostrils. ?A transtracheal catheter. This is a small, thin tube placed in the windpipe (trachea). ?A breathing tube (tracheostomy tube) that is surgically placed in the windpipe. This may be used in severe cases. ?These devices are connected with tubing to an oxygen source, such as: ?A tank. Tanks hold oxygen in gas form. They must be replaced when the oxygen is used up. ?A liquid oxygen device. This  holds oxygen in liquid form. Liquid oxygen is very cold. It must be replaced when the oxygen is used up. ?An oxygen concentrator machine. This filters oxygen in the room. There are two types of oxygen concentrator machines--stationary and portable. ?A stationary oxygen concentrator machine plugs into the main electricity supply at your home. You must have a backup cylinder of oxygen in case the power goes out. ?A portable oxygen concentrator machine is smaller in size and more lightweight. This machine uses battery supply and can be used outside the home. ?Work with your health care provider to find equipment that works best for you and your lifestyle. ?What are the  risks? ?Delivery of supplemental oxygen is generally safe. However, some risks include: ?Fire. This can happen if the oxygen is exposed to a heat source, flame, or spark. ?Injury to skin. This can happen if liquid oxyge

## 2022-01-03 NOTE — Progress Notes (Signed)
? ?'@Patient'$  ID: Barbara Stanley, female    DOB: October 26, 1970, 51 y.o.   MRN: [id number] ? ?Chief Complaint  ?Patient presents with  ? Hospitalization Follow-up  ?  Pt is here for hospital follow up. Pt went to hospital for coughing and pains in her chest. Was discharged on 2L of oxygen and is wearing it a good portion of the time.   ? ? ?Referring provider: ?Barbara Francois, NP ? ?HPI: ?51 year old female, current everyday smoker (30-pack-year history).  Past medical history significant for chronic obstructive pulmonary disease, GERD, chronic idiopathic thrombocytopenia, GERD depressive disorder, PTSD, side attempt, chronic splenomegaly, status post gastric bypass 2003. Patient of Dr. Valeta Stanley.  ? ?Patient was referred to pulmonary by primary care visit.  She was also advised to quit smoking.  Longtime smoker.  No prior pulmonary function tests. She was told in the past that she has chronic bronchitis. She used Symbicort and Spiriva HandiHaler. Smoked since age 72, continues to smoke 1 ppd. She has always had difficult quitting. She has used Wellbutrin in the past. She has mental health issues and feels uncomfortable with using chantix.  She still has significant respiratory symptoms with exertion.  She also notices heat and humidity makes her symptoms worse.  She does have nocturnal wheezing and shortness of breath. ? ?Previous [facility] pulmonary encounters: ?OV 07/03/2019-Barbara Stanley  ?Patient here for follow-up regarding recent initiation of Trelegy.  Had pulmonary function test completed at the beginning of the week with an FEV1 of 67%.  New diagnosis of stage II COPD.  CT imaging with evidence of emphysema as well.  Overall since starting new inhaler she is breathing somewhat better.  She has tapered herself down from a full pack per day of smoking to two thirds of a pack of cigarettes per day.  She is trying to quit however is exposed to ongoing secondhand smoke from her family as well.  Patient has daily cough and occasional  wheezing.  Denies hemoptysis, fevers night sweats weight loss.  ?  ?6/25/2021Volanda Stanley ?Breathing wise she is doing well, no acute respiratory complaints. Compliant with Trelegy and has noticed a significant improvement in her breathing since starting this medication. Reports that she rarely needs to use her albuterol rescue inhaler. She uses her nebulizer when its very hot or humid outside, she has only needed to use this once in the last several months. She is still smoking, she was previously smoking 2ppd and has cut down to 1/2 ppd. It is hard for her to quit because her husband smokes. She is not a candidate for chantix d/t hx MDD. Her goal is to get down to 5 cigarettes and she will then consider a quit date. She will use nicotine patches to help assist her when she is ready.  ? ?11/09/20-Barbara Stanley ?51 year old female current everyday smoker followed in our office for COPD.  Patient is established with Dr. Valeta Stanley.  Patient completing a 4-week follow-up with our office today.  Patient reports that breathing has significantly improved since adding Spiriva Respimat last office visit.  Patient is also aggressively working on stopping smoking.  She is down to 5 cigarettes a day.  She reports that her husband is also working on stopping smoking.  Patient is quite proud of her progress. ? ?Patient reports adherence to Advair 250 and Spiriva Respimat 2.5. ? ?Patient occasionally has some clear nasal drainage and will cough up white to clear mucus.  She feels that this is her baseline. ? ?02/09/2021  ?Patient presents  today for 3 month follow-up COPD. She is doing well, no acute complaints. No recent exacerbations. She is compliant with Advair HFA and Spiriva Respimat. She has not needed to use her rescue inhaler in months. She quit smoking a few weeks ago, still finds it tempting. She would like to be referred to lung cancer screening program, she will be turning 50 next month and has 30 pack year hx.  Denies f/c/s, chest  tightness, wheezing , cough or shortness of breath.  ? ?01/03/2022- Interim hx  ?Patient presents today for hospital follow-up. She was admitted from 12/17/21-12/20/21 for COPD exacerbation. She originally presented with worsening dyspnea, cough and pleuritic pain x 2 days. Treated with azithromycin, steroids and bronchodilators. Her O2 levels were running between 85-92% RA. She was discharged on oxygen. She is felling well today, no acute complaints. She still has a cough but quit smoking 2 weeks ago. She has noticed a big different since getting oxygen at home. She has been using 2l with exertion and at bedtime. O2 at rest has been staying about 95%.  ? ?96% 2 laps  ? ?Allergies  ?Allergen Reactions  ? Other   ?  "Banana Bag" given to patient at physician's office, severe back pain, SOB  ? Nicotine Rash  ?  Nicotine patch  ? ? ?Immunization History  ?Administered Date(s) Administered  ? Influenza,inj,Quad PF,6+ Mos 08/07/2017, 07/27/2018, 07/04/2019  ? Influenza,inj,Quad PF,6-35 Mos 08/10/2020  ? PFIZER(Purple Top)SARS-COV-2 Vaccination 01/30/2020, 02/21/2020  ? Pneumococcal Conjugate-13 07/27/2018  ? Pneumococcal Polysaccharide-23 03/22/2014, 08/07/2017, 04/01/2021  ? Tdap 08/07/2017  ? ? ?Past Medical History:  ?Diagnosis Date  ? Abdominal pain   ? Anemia   ? Anxiety   ? Bronchitis   ? COPD (chronic obstructive pulmonary disease) ([place])   ? Depression   ? bipolar  ? Diarrhea   ? Emphysema   ? GERD (gastroesophageal reflux disease)   ? History of blood transfusion   ? MDS (myelodysplastic syndrome) ([place])   ? Splenomegaly   ? r/t low platelets  ? Thrombocytopenia ([place])   ? Tobacco abuse   ? ? ?Tobacco History: ?Social History  ? ?Tobacco Use  ?Smoking Status Former  ? Packs/day: 0.50  ? Years: 30.00  ? Pack years: 15.00  ? Types: Cigarettes  ? Quit date: 01/31/2021  ? Years since quitting: 0.9  ?Smokeless Tobacco Never  ?Tobacco Comments  ? quit mid april 2022  ? ?Counseling given: Not Answered ?Tobacco comments: quit  mid april 2022 ? ? ?Outpatient Medications Prior to Visit  ?Medication Sig Dispense Refill  ? Accu-Chek Softclix Lancets lancets USE AS DIRECTED USE TWO TIMES A DAY 100 each 12  ? acetaminophen (TYLENOL) 500 MG tablet Take 1,000 mg by mouth daily as needed for moderate pain.    ? albuterol (VENTOLIN HFA) 108 (90 Base) MCG/ACT inhaler Inhale 1 - 2 puffs into the lungs every 6  hours as needed for wheezing or shortness of breath. 18 g 0  ? busPIRone (BUSPAR) 5 MG tablet TAKE 1 TABLET(5 MG) BY MOUTH THREE TIMES DAILY (Patient taking differently: Take 5 mg by mouth 3 (three) times daily.) 90 tablet 0  ? cholecalciferol (VITAMIN D3) 25 MCG (1000 UT) tablet Take 5,000 Units by mouth daily.    ? colestipol (COLESTID) 1 g tablet TAKE 2 TABLETS(2 GRAMS) BY MOUTH TWICE DAILY (Patient taking differently: Take 2 g by mouth 2 (two) times daily.) 360 tablet 0  ? Copper Gluconate 2 MG CAPS Take 4 mg by  mouth daily.    ? diclofenac sodium (VOLTAREN) 1 % GEL Apply 2 g topically 4 (four) times daily. (Patient taking differently: Apply 2 g topically daily as needed (for pain).) 2 g 3  ? dicyclomine (BENTYL) 20 MG tablet Take 1 tablet (20 mg total) by mouth in the morning, at noon, in the evening, and at bedtime. (Patient taking differently: Take 20 mg by mouth 4 (four) times daily as needed for spasms.) 120 tablet 3  ? eltrombopag (PROMACTA) 50 MG tablet Take 50 mg by mouth daily.    ? esomeprazole (NEXIUM) 40 MG capsule Take 1 capsule (40 mg total) by mouth 2 (two) times daily. 180 capsule 3  ? famotidine (PEPCID) 20 MG tablet Take 1 tablet (20 mg total) by mouth 2 (two) times daily. 180 tablet 3  ? folic acid (FOLVITE) 1 MG tablet TAKE 1 TABLET(1 MG) BY MOUTH DAILY (Patient taking differently: Take 1 mg by mouth daily.) 90 tablet 2  ? guaiFENesin (MUCINEX) 600 MG 12 hr tablet Take 1 tablet by mouth 2 times daily for 15 days. 30 tablet 0  ? ipratropium-albuterol (DUONEB) 0.5-2.5 (3) MG/3ML SOLN Take 3 mLs by nebulization every 6  hours as needed. 360 mL 0  ? nicotine polacrilex (COMMIT) 4 MG lozenge Take 1 lozenge (4 mg total) by mouth as needed for smoking cessation. 108 tablet 3  ? promethazine (PHENERGAN) 25 MG tablet Take 1 tablet (25 m

## 2022-01-03 NOTE — Assessment & Plan Note (Addendum)
-   Hospitalized for AECOPD from 12/17/21-12/20/21. Treated with azithromycin, steroids and BDs. She is back to baseline. No residual symptoms except for an occasional cough. She quit smoking two weeks ago. Continue maintenance regimen with Advair 230-21 and Spiriva Respimat 2.53mg. Advised she take Mucinex 600-1,'200mg'$  twice daily as needed for cough/congestion. FU in 3 months or sooner if needed  ?

## 2022-01-03 NOTE — Assessment & Plan Note (Signed)
-   Patient was discharged home on oxygen after most recent hospitalization. She has been wearing 2L oxygen with exertion and at bedtime. Simple walk test today in office showed she was able to maintain O2 level >90% on room air. Advised she continue to monitor her levels at home and use supplemental oxygen as needed to maintain O2 >88-92% and continue 2L at bedtime. We will reassess at follow-up.  ?

## 2022-01-12 ENCOUNTER — Ambulatory Visit: Payer: Medicaid Other

## 2022-01-14 ENCOUNTER — Other Ambulatory Visit: Payer: Self-pay | Admitting: Gastroenterology

## 2022-01-17 ENCOUNTER — Encounter: Payer: Self-pay | Admitting: Nurse Practitioner

## 2022-01-17 ENCOUNTER — Ambulatory Visit (INDEPENDENT_AMBULATORY_CARE_PROVIDER_SITE_OTHER): Payer: Medicaid Other | Admitting: Nurse Practitioner

## 2022-01-17 VITALS — BP 130/70 | HR 85 | Temp 97.6°F | Ht 66.0 in | Wt 220.1 lb

## 2022-01-17 DIAGNOSIS — M5440 Lumbago with sciatica, unspecified side: Secondary | ICD-10-CM | POA: Diagnosis not present

## 2022-01-17 DIAGNOSIS — G8929 Other chronic pain: Secondary | ICD-10-CM | POA: Diagnosis not present

## 2022-01-17 MED ORDER — PREDNISONE 20 MG PO TABS: 20.0000 mg | ORAL_TABLET | Freq: Every day | ORAL | 0 refills | Status: AC

## 2022-01-17 MED ORDER — CYCLOBENZAPRINE HCL 10 MG PO TABS: 10.0000 mg | ORAL_TABLET | Freq: Three times a day (TID) | ORAL | 0 refills | Status: DC | PRN

## 2022-01-17 MED ORDER — LIDOCAINE 4 % EX PTCH: 1.0000 | MEDICATED_PATCH | CUTANEOUS | 1 refills | Status: DC

## 2022-01-17 MED ORDER — KETOROLAC TROMETHAMINE 30 MG/ML IJ SOLN
15.0000 mg | Freq: Once | INTRAMUSCULAR | Status: AC
  Administered 2022-01-17: 15 mg via INTRAMUSCULAR

## 2022-01-17 NOTE — Telephone Encounter (Signed)
Patient needs office visit.  

## 2022-01-17 NOTE — Progress Notes (Signed)
? ?[facility] ?[place], [place]  [address] ?Phone:  [phone]   Fax:  [phone] ?Subjective:  ? Patient ID: Barbara Stanley, female    DOB: Aug 27, 1971, 51 y.o.   MRN: [id number] ? ?Chief Complaint  ?Patient presents with  ? Back Pain  ?  Pt stated she is here for back pain. Pt stated she can't sleep can't rollover   ? ?HPI ?Barbara Stanley 51 y.o. female  has a past medical history of Abdominal pain, Anemia, Anxiety, Bronchitis, COPD (chronic obstructive pulmonary disease) ([place]), Depression, Diarrhea, Emphysema, GERD (gastroesophageal reflux disease), History of blood transfusion, MDS (myelodysplastic syndrome) ([place]), Splenomegaly, Thrombocytopenia ([place]), and Tobacco abuse. To the [facility] for acute on chronic low back pain.  ? ?Has had low back pain x 203 mths. Pain worsened 3 days ago after seeing the chiropractor. Has been taking Tylenol 1600 with no improvement in symptoms. Laying makes the pain worse. Radiation of pain down right leg, currently rates 7/10 and describes as throbbing and stabbing. Was informed by chiropractor today to walk for pain relief, but it only caused pain to worsen. Denies any numbness or tingling. Denies any recent trauma or injury. Denies any other concerns today. ? ?Denies any fatigue, chest pain, shortness of breath, HA or dizziness. Denies any blurred vision, numbness or tingling. ? ? ?Past Medical History:  ?Diagnosis Date  ? Abdominal pain   ? Anemia   ? Anxiety   ? Bronchitis   ? COPD (chronic obstructive pulmonary disease) ([place])   ? Depression   ? bipolar  ? Diarrhea   ? Emphysema   ? GERD (gastroesophageal reflux disease)   ? History of blood transfusion   ? MDS (myelodysplastic syndrome) ([place])   ? Splenomegaly   ? r/t low platelets  ? Thrombocytopenia ([place])   ? Tobacco abuse   ? ? ?Past Surgical History:  ?Procedure Laterality Date  ? ABDOMINAL HYSTERECTOMY    ? c section x 3     ? CHOLECYSTECTOMY    ? GASTRIC BYPASS    ? UPPER  GASTROINTESTINAL ENDOSCOPY    ? WISDOM TOOTH EXTRACTION    ? ? ?Family History  ?Problem Relation Age of Onset  ? Diabetes Mother   ? Diabetes Other   ? Hypertension Other   ? CAD Other   ? Heart disease Father   ? Colon cancer Neg Hx   ? Rectal cancer Neg Hx   ? Stomach cancer Neg Hx   ? Esophageal cancer Neg Hx   ? ? ?Social History  ? ?Socioeconomic History  ? Marital status: Married  ?  Spouse name: Not on file  ? Number of children: 3  ? Years of education: Not on file  ? Highest education level: Not on file  ?Occupational History  ? Occupation: disabled  ?Tobacco Use  ? Smoking status: Former  ?  Packs/day: 0.50  ?  Years: 30.00  ?  Pack years: 15.00  ?  Types: Cigarettes  ?  Quit date: 01/31/2021  ?  Years since quitting: 0.9  ? Smokeless tobacco: Never  ? Tobacco comments:  ?  quit mid april 2022  ?Vaping Use  ? Vaping Use: Never used  ?Substance and Sexual Activity  ? Alcohol use: Not Currently  ?  Comment: occasional wine  ? Drug use: No  ? Sexual activity: Yes  ?  Birth control/protection: None  ?  Comment: Hysterectomy  ?Other Topics Concern  ? Not on file  ?  Social History Narrative  ? Not on file  ? ?Social Determinants of Health  ? ?Financial Resource Strain: Not on file  ?Food Insecurity: Not on file  ?Transportation Needs: Not on file  ?Physical Activity: Not on file  ?Stress: Not on file  ?Social Connections: Not on file  ?Intimate Partner Violence: Not on file  ? ? ?Outpatient Medications Prior to Visit  ?Medication Sig Dispense Refill  ? acetaminophen (TYLENOL) 500 MG tablet Take 1,000 mg by mouth daily as needed for moderate pain.    ? albuterol (VENTOLIN HFA) 108 (90 Base) MCG/ACT inhaler Inhale 1 - 2 puffs into the lungs every 6  hours as needed for wheezing or shortness of breath. 18 g 0  ? busPIRone (BUSPAR) 5 MG tablet TAKE 1 TABLET(5 MG) BY MOUTH THREE TIMES DAILY (Patient taking differently: Take 5 mg by mouth 3 (three) times daily.) 90 tablet 0  ? cholecalciferol (VITAMIN D3) 25 MCG (1000  UT) tablet Take 5,000 Units by mouth daily.    ? colestipol (COLESTID) 1 g tablet Take 2 tablets (2 g total) by mouth 2 (two) times daily. 120 tablet 0  ? Copper Gluconate 2 MG CAPS Take 4 mg by mouth daily.    ? diclofenac sodium (VOLTAREN) 1 % GEL Apply 2 g topically 4 (four) times daily. (Patient taking differently: Apply 2 g topically daily as needed (for pain).) 2 g 3  ? dicyclomine (BENTYL) 20 MG tablet Take 1 tablet (20 mg total) by mouth in the morning, at noon, in the evening, and at bedtime. (Patient taking differently: Take 20 mg by mouth 4 (four) times daily as needed for spasms.) 120 tablet 3  ? eltrombopag (PROMACTA) 50 MG tablet Take 50 mg by mouth daily.    ? esomeprazole (NEXIUM) 40 MG capsule Take 1 capsule (40 mg total) by mouth 2 (two) times daily. 60 capsule 0  ? famotidine (PEPCID) 20 MG tablet Take 1 tablet (20 mg total) by mouth 2 (two) times daily. 180 tablet 3  ? fluticasone-salmeterol (ADVAIR HFA) 230-21 MCG/ACT inhaler Inhale 2 puffs into the lungs 2 (two) times daily. 12 g 5  ? folic acid (FOLVITE) 1 MG tablet TAKE 1 TABLET(1 MG) BY MOUTH DAILY (Patient taking differently: Take 1 mg by mouth daily.) 90 tablet 2  ? ipratropium-albuterol (DUONEB) 0.5-2.5 (3) MG/3ML SOLN Take 3 mLs by nebulization every 6 hours as needed. 360 mL 0  ? nicotine polacrilex (COMMIT) 4 MG lozenge Take 1 lozenge (4 mg total) by mouth as needed for smoking cessation. 108 tablet 3  ? promethazine (PHENERGAN) 25 MG tablet Take 1 tablet (25 mg total) by mouth every 6 (six) hours as needed for refractory nausea / vomiting. 30 tablet 0  ? sulfamethoxazole-trimethoprim (BACTRIM DS) 800-160 MG tablet Take 1 tablet by mouth See admin instructions. Monday, Wednesday, Friday    ? Tiotropium Bromide Monohydrate (SPIRIVA RESPIMAT) 2.5 MCG/ACT AERS Inhale 2 puffs into the lungs daily. 4 g 5  ? traZODone (DESYREL) 150 MG tablet Take 1 tablet (150 mg total) by mouth at bedtime. For sleep (Patient taking differently: Take 400 mg  by mouth at bedtime. For sleep) 30 tablet 0  ? venlafaxine XR (EFFEXOR-XR) 75 MG 24 hr capsule Take 1 capsule (75 mg total) by mouth daily with breakfast. For depression (Patient taking differently: Take 225 mg by mouth daily with breakfast. For depression) 30 capsule 0  ? Accu-Chek Softclix Lancets lancets USE AS DIRECTED USE TWO TIMES A DAY (Patient not taking:  Reported on 01/17/2022) 100 each 12  ? ?No facility-administered medications prior to visit.  ? ? ?Allergies  ?Allergen Reactions  ? Other   ?  "Banana Bag" given to patient at physician's office, severe back pain, SOB  ? Nicotine Rash  ?  Nicotine patch  ? ? ?Review of Systems  ?Constitutional:  Negative for chills, fever and malaise/fatigue.  ?Respiratory:  Negative for cough and shortness of breath.   ?Cardiovascular:  Negative for chest pain, palpitations and leg swelling.  ?Gastrointestinal:  Negative for abdominal pain, blood in stool, constipation, diarrhea, nausea and vomiting.  ?Musculoskeletal:  Positive for back pain.  ?Skin: Negative.   ?Neurological: Negative.   ?Psychiatric/Behavioral:  Negative for depression. The patient is not nervous/anxious.   ?All other systems reviewed and are negative. ? ?   ?Objective:  ?  ?Physical Exam ?Constitutional:   ?   General: She is not in acute distress. ?   Appearance: Normal appearance.  ?HENT:  ?   Head: Normocephalic.  ?Cardiovascular:  ?   Rate and Rhythm: Normal rate and regular rhythm.  ?   Pulses: Normal pulses.  ?   Heart sounds: Normal heart sounds.  ?   Comments: No obvious peripheral edema ?Pulmonary:  ?   Effort: Pulmonary effort is normal.  ?   Breath sounds: Normal breath sounds.  ?Musculoskeletal:     ?   General: Tenderness present. No swelling, deformity or signs of injury. Normal range of motion.  ?   Right lower leg: No edema.  ?   Left lower leg: No edema.  ?   Comments: Moderate tenderness with palpation of mid lower lumbar   ?Skin: ?   General: Skin is warm and dry.  ?   Capillary  Refill: Capillary refill takes less than 2 seconds.  ?Neurological:  ?   General: No focal deficit present.  ?   Mental Status: She is alert and oriented to person, place, and time.  ?Psychiatric:     ?   Mood and Aff

## 2022-01-17 NOTE — Patient Instructions (Signed)
You were seen today in the [facility] for low back pain. Labs were collected, results will be available via MyChart or, if abnormal, you will be contacted by clinic staff. You were prescribed medications, please take as directed. Please follow up in 2 wks for reevaluation if symptoms do not improve.  ?

## 2022-01-28 ENCOUNTER — Other Ambulatory Visit: Payer: Self-pay | Admitting: Nurse Practitioner

## 2022-01-28 ENCOUNTER — Encounter: Payer: Self-pay | Admitting: Emergency Medicine

## 2022-01-28 ENCOUNTER — Ambulatory Visit
Admission: EM | Admit: 2022-01-28 | Discharge: 2022-01-28 | Disposition: A | Discharge status: Home / Self Care | Payer: Medicaid Other | Attending: Family Medicine | Admitting: Family Medicine

## 2022-01-28 ENCOUNTER — Ambulatory Visit (INDEPENDENT_AMBULATORY_CARE_PROVIDER_SITE_OTHER): Payer: Medicaid Other

## 2022-01-28 DIAGNOSIS — G8929 Other chronic pain: Secondary | ICD-10-CM

## 2022-01-28 DIAGNOSIS — M545 Low back pain, unspecified: Secondary | ICD-10-CM

## 2022-01-28 MED ORDER — TRAMADOL HCL 50 MG PO TABS: 50.0000 mg | ORAL_TABLET | Freq: Four times a day (QID) | ORAL | 0 refills | Status: DC | PRN

## 2022-01-28 MED ORDER — KETOROLAC TROMETHAMINE 30 MG/ML IJ SOLN
30.0000 mg | Freq: Once | INTRAMUSCULAR | Status: AC
  Administered 2022-01-28: 30 mg via INTRAMUSCULAR

## 2022-01-28 NOTE — ED Provider Notes (Addendum)
?[facility] ? ? ? ?CSN: [id number] ?Arrival date & time: 01/28/22  1528 ? ? ?  ? ?History   ?Chief Complaint ?Chief Complaint  ?Patient presents with  ? Back Pain  ? ? ?HPI ?Barbara Stanley is a 51 y.o. female.  ? ? ?Back Pain ?Here with severe back pain in the lumbar area since yesterday. ? ?About 2 weeks ago she was seen by her primary care office for back pain, and she improved after prednisone and Toradol.  Then yesterday she was in her kitchen and coughed and had sudden onset of very severe pain.  No fever or chills or cough ? ?Past Medical History:  ?Diagnosis Date  ? Abdominal pain   ? Anemia   ? Anxiety   ? Bronchitis   ? COPD (chronic obstructive pulmonary disease) ([place])   ? Depression   ? bipolar  ? Diarrhea   ? Emphysema   ? GERD (gastroesophageal reflux disease)   ? History of blood transfusion   ? MDS (myelodysplastic syndrome) ([place])   ? Splenomegaly   ? r/t low platelets  ? Thrombocytopenia ([place])   ? Tobacco abuse   ? ? ?Patient Active Problem List  ? Diagnosis Date Noted  ? History of acute respiratory failure 01/03/2022  ? Acute exacerbation of chronic obstructive pulmonary disease (COPD) ([place]) 12/17/2021  ? Malnutrition, unspecified type ([place]) 04/01/2021  ? Former smoker 02/09/2021  ? Healthcare maintenance 10/12/2020  ? Iron deficiency anemia 11/12/2019  ? Splenomegaly, congestive, chronic 09/05/2019  ? Chronic ITP (idiopathic thrombocytopenia) ([facility]) 08/28/2019  ? Urinary incontinence, mixed 08/13/2019  ? MDS (myelodysplastic syndrome), low grade ([place]) 07/26/2019  ? Vitamin D deficiency 10/08/2018  ? Vitamin B12 deficiency due to intestinal malabsorption 10/08/2018  ? 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 Dietary copper deficiency 09/27/2018  ? Other pancytopenia ([place]) 09/22/2018  ? Thrombocytopenia ([place]) 08/08/2018  ? MDD (major depressive disorder), single episode, severe with psychosis ([place]) 11/05/2014  ? PTSD (post-traumatic stress disorder) 11/05/2014  ? Depressive disorder 06/04/2014  ?  Depression with anxiety 06/04/2014  ? Major depression, recurrent ([place]) 03/21/2014  ? Suicide attempt ([place]) 03/21/2014  ? Suicidal ideations 03/21/2014  ? MDD (major depressive disorder) 03/21/2014  ? Nonspecific abnormal unspecified cardiovascular function study 09/30/2013  ? Tobacco use disorder 09/30/2013  ? COPD (chronic obstructive pulmonary disease) ([place])   ? GERD (gastroesophageal reflux disease)   ? Anemia   ? ? ?Past Surgical History:  ?Procedure Laterality Date  ? ABDOMINAL HYSTERECTOMY    ? c section x 3     ? CHOLECYSTECTOMY    ? GASTRIC BYPASS    ? UPPER GASTROINTESTINAL ENDOSCOPY    ? WISDOM TOOTH EXTRACTION    ? ? ?OB History   ? ? Gravida  ?4  ? Para  ?   ? Term  ?   ? Preterm  ?   ? AB  ?   ? Living  ?3  ?  ? ? SAB  ?   ? IAB  ?   ? Ectopic  ?   ? Multiple  ?   ? Live Births  ?   ?   ?  ?  ? ? ? ?Home Medications   ? ?Prior to Admission medications   ?Medication Sig Start Date End Date Taking? Authorizing Provider  ?Accu-Chek Softclix Lancets lancets USE AS DIRECTED USE TWO TIMES A DAY 01/08/21  Yes [name], NP  ?acetaminophen (TYLENOL) 500 MG tablet Take 1,000 mg by mouth  daily as needed for moderate pain.   Yes [provider]  ?albuterol (VENTOLIN HFA) 108 (90 Base) MCG/ACT inhaler Inhale 1 - 2 puffs into the lungs every 6  hours as needed for wheezing or shortness of breath. 12/20/21  Yes Arrien, Jimmy Picket, MD  ?busPIRone (BUSPAR) 5 MG tablet TAKE 1 TABLET(5 MG) BY MOUTH THREE TIMES DAILY ?Patient taking differently: Take 5 mg by mouth 3 (three) times daily. 08/17/20  Yes [name], NP  ?cholecalciferol (VITAMIN D3) 25 MCG (1000 UT) tablet Take 5,000 Units by mouth daily.   Yes [provider]  ?colestipol (COLESTID) 1 g tablet Take 2 tablets (2 g total) by mouth 2 (two) times daily. 01/17/22  Yes Thornton Park, MD  ?Copper Gluconate 2 MG CAPS Take 4 mg by mouth daily.   Yes [provider]  ?cyclobenzaprine (FLEXERIL) 10 MG tablet Take 1 tablet (10  mg total) by mouth 3 (three) times daily as needed for muscle spasms. 01/17/22  Yes Passmore, Jake Church I, NP  ?diclofenac sodium (VOLTAREN) 1 % GEL Apply 2 g topically 4 (four) times daily. ?Patient taking differently: Apply 2 g topically daily as needed (for pain). 07/24/19  Yes Azzie Glatter, FNP  ?dicyclomine (BENTYL) 20 MG tablet Take 1 tablet (20 mg total) by mouth in the morning, at noon, in the evening, and at bedtime. ?Patient taking differently: Take 20 mg by mouth 4 (four) times daily as needed for spasms. 02/24/21  Yes Thornton Park, MD  ?eltrombopag (PROMACTA) 50 MG tablet Take 50 mg by mouth daily. 06/30/20  Yes [provider]  ?esomeprazole (NEXIUM) 40 MG capsule Take 1 capsule (40 mg total) by mouth 2 (two) times daily. 01/17/22  Yes Thornton Park, MD  ?famotidine (PEPCID) 20 MG tablet Take 1 tablet (20 mg total) by mouth 2 (two) times daily. 02/24/21  Yes Thornton Park, MD  ?fluticasone-salmeterol (ADVAIR HFA) 230-21 MCG/ACT inhaler Inhale 2 puffs into the lungs 2 (two) times daily. 01/03/22  Yes Martyn Ehrich, NP  ?folic acid (FOLVITE) 1 MG tablet TAKE 1 TABLET(1 MG) BY MOUTH DAILY ?Patient taking differently: Take 1 mg by mouth daily. 03/26/20  Yes Gorsuch, Ni, MD  ?ipratropium-albuterol (DUONEB) 0.5-2.5 (3) MG/3ML SOLN Take 3 mLs by nebulization every 6 hours as needed. 12/20/21  Yes Arrien, Jimmy Picket, MD  ?Lidocaine (HM LIDOCAINE PATCH) 4 % PTCH Apply 1 patch topically daily. 01/17/22  Yes Passmore, Jake Church I, NP  ?nicotine polacrilex (COMMIT) 4 MG lozenge Take 1 lozenge (4 mg total) by mouth as needed for smoking cessation. 10/12/20  Yes Lauraine Rinne, NP  ?promethazine (PHENERGAN) 25 MG tablet Take 1 tablet (25 mg total) by mouth every 6 (six) hours as needed for refractory nausea / vomiting. 04/20/21  Yes Wieters, Hallie C, PA-C  ?sulfamethoxazole-trimethoprim (BACTRIM DS) 800-160 MG tablet Take 1 tablet by mouth See admin instructions. Monday, Wednesday, Friday   Yes  [provider]  ?Tiotropium Bromide Monohydrate (SPIRIVA RESPIMAT) 2.5 MCG/ACT AERS Inhale 2 puffs into the lungs daily. 01/03/22  Yes Martyn Ehrich, NP  ?traMADol (ULTRAM) 50 MG tablet Take 1 tablet (50 mg total) by mouth every 6 (six) hours as needed. 01/28/22  Yes Barrett Henle, MD  ?traZODone (DESYREL) 150 MG tablet Take 1 tablet (150 mg total) by mouth at bedtime. For sleep ?Patient taking differently: Take 400 mg by mouth at bedtime. For sleep 11/08/14  Yes Rankin, Shuvon B, NP  ?venlafaxine XR (EFFEXOR-XR) 75 MG 24 hr capsule  Take 1 capsule (75 mg total) by mouth daily with breakfast. For depression ?Patient taking differently: Take 225 mg by mouth daily with breakfast. For depression 11/08/14  Yes Rankin, Shuvon B, NP  ? ? ?Family History ?Family History  ?Problem Relation Age of Onset  ? Diabetes Mother   ? Diabetes Other   ? Hypertension Other   ? CAD Other   ? Heart disease Father   ? Colon cancer Neg Hx   ? Rectal cancer Neg Hx   ? Stomach cancer Neg Hx   ? Esophageal cancer Neg Hx   ? ? ?Social History ?Social History  ? ?Tobacco Use  ? Smoking status: Former  ?  Packs/day: 0.50  ?  Years: 30.00  ?  Pack years: 15.00  ?  Types: Cigarettes  ?  Quit date: 01/31/2021  ?  Years since quitting: 0.9  ? Smokeless tobacco: Never  ? Tobacco comments:  ?  quit mid april 2022  ?Vaping Use  ? Vaping Use: Never used  ?Substance Use Topics  ? Alcohol use: Not Currently  ?  Comment: occasional wine  ? Drug use: No  ? ? ? ?Allergies   ?Other and Nicotine ? ? ?Review of Systems ?Review of Systems  ?Musculoskeletal:  Positive for back pain.  ? ? ?Physical Exam ?Triage Vital Signs ?ED Triage Vitals  ?Enc Vitals Group  ?   BP 01/28/22 1538 116/71  ?   Pulse Rate 01/28/22 1538 98  ?   Resp 01/28/22 1538 20  ?   Temp 01/28/22 1538 98.1 ?F (36.7 ?C)  ?   Temp Source 01/28/22 1538 Oral  ?   SpO2 01/28/22 1538 92 %  ?   Weight 01/28/22 1541 220 lb 0.3 oz (99.8 kg)  ?   Height 01/28/22 1541 '5\' 6"'$  (1.676 m)  ?    Head Circumference --   ?   Peak Flow --   ?   Pain Score 01/28/22 1541 9  ?   Pain Loc --   ?   Pain Edu? --   ?   Excl. in [place]? --   ? ?No data found. ? ?Updated Vital Signs ?BP 116/71 (BP Location: Left Arm)

## 2022-01-28 NOTE — Discharge Instructions (Addendum)
You have been given a shot of Toradol 30 mg today. ? ?Your x-rays showed some possible newer compression fractures but they are at T12 and L1, and this is higher than where you are hurting ? ?Take tramadol 50 mg--1 tablet every 6 hours as needed for pain.  This medication can make you sleepy or dizzy ?

## 2022-01-28 NOTE — ED Triage Notes (Signed)
Patient states that she has a history of low back pain treated 2 weeks ago, was getting better, came back about 1 day ago after coughing.  Now having shooting pain in her lower back.  ?

## 2022-01-31 ENCOUNTER — Ambulatory Visit (INDEPENDENT_AMBULATORY_CARE_PROVIDER_SITE_OTHER): Payer: Medicaid Other | Admitting: Nurse Practitioner

## 2022-01-31 ENCOUNTER — Encounter: Payer: Self-pay | Admitting: Nurse Practitioner

## 2022-01-31 DIAGNOSIS — M5442 Lumbago with sciatica, left side: Secondary | ICD-10-CM | POA: Diagnosis not present

## 2022-01-31 DIAGNOSIS — G8929 Other chronic pain: Secondary | ICD-10-CM

## 2022-01-31 DIAGNOSIS — G629 Polyneuropathy, unspecified: Secondary | ICD-10-CM

## 2022-01-31 DIAGNOSIS — M5441 Lumbago with sciatica, right side: Secondary | ICD-10-CM | POA: Diagnosis not present

## 2022-01-31 MED ORDER — GABAPENTIN 300 MG PO CAPS: 300.0000 mg | ORAL_CAPSULE | Freq: Two times a day (BID) | ORAL | 0 refills | Status: DC

## 2022-01-31 MED ORDER — PREDNISONE 20 MG PO TABS: 20.0000 mg | ORAL_TABLET | Freq: Every day | ORAL | 0 refills | Status: AC

## 2022-01-31 MED ORDER — KETOROLAC TROMETHAMINE 30 MG/ML IJ SOLN
15.0000 mg | Freq: Once | INTRAMUSCULAR | Status: AC
  Administered 2022-01-31: 15 mg via INTRAMUSCULAR

## 2022-01-31 MED ORDER — METHOCARBAMOL 500 MG PO TABS
500.0000 mg | ORAL_TABLET | Freq: Four times a day (QID) | ORAL | 0 refills | Status: DC | PRN
Start: 2022-01-31 — End: 2022-02-15

## 2022-01-31 NOTE — Patient Instructions (Addendum)
You were seen today in the [facility] for low back pain.  You were prescribed medications, please take as directed. Please maintain upcoming follow up with PCP in June.  ?

## 2022-01-31 NOTE — Progress Notes (Signed)
? ?[facility] ?[place], [place]  [address] ?Phone:  [phone]   Fax:  [phone] ?Subjective:  ? Patient ID: Barbara Stanley, female    DOB: 06-28-1971, 51 y.o.   MRN: [id number] ? ?No chief complaint on file. ? ?HPI ?Barbara Stanley 51 y.o. female  has a past medical history of Abdominal pain, Anemia, Anxiety, Bronchitis, COPD (chronic obstructive pulmonary disease) ([place]), Depression, Diarrhea, Emphysema, GERD (gastroesophageal reflux disease), History of blood transfusion, MDS (myelodysplastic syndrome) ([place]), Splenomegaly, Thrombocytopenia ([place]), and Tobacco abuse. To the [facility] for [facility] back pain. ? ?Patient states that she injured her back again four days ago. Had some relief after last visit with prednisone, toradol and muscle relaxant. Patient states that she was standing in the kitchen drinking coffee, when she coughed causing a significant spasm in back. Patient states that pain is in bilateral lower back and most pronounced in the left lower back and radiates down LLE. ? ?Patient went to urgent care and was given tramadol and referred to PCP for referral to PT and additional management. Patient denies any recent trauma or injury. Patient states that pain worsens with prolonged standing and/ sitting. Describes pain as throbbing/ shooting with some numbness in the LLE. Denies any other concerns today. ? ?Denies any fatigue, chest pain, shortness of breath, HA or dizziness. Denies any blurred vision. ? ?Past Medical History:  ?Diagnosis Date  ? Abdominal pain   ? Anemia   ? Anxiety   ? Bronchitis   ? COPD (chronic obstructive pulmonary disease) ([place])   ? Depression   ? bipolar  ? Diarrhea   ? Emphysema   ? GERD (gastroesophageal reflux disease)   ? History of blood transfusion   ? MDS (myelodysplastic syndrome) ([place])   ? Splenomegaly   ? r/t low platelets  ? Thrombocytopenia ([place])   ? Tobacco abuse   ? ? ?Past Surgical History:  ?Procedure Laterality Date  ? ABDOMINAL  HYSTERECTOMY    ? c section x 3     ? CHOLECYSTECTOMY    ? GASTRIC BYPASS    ? UPPER GASTROINTESTINAL ENDOSCOPY    ? WISDOM TOOTH EXTRACTION    ? ? ?Family History  ?Problem Relation Age of Onset  ? Diabetes Mother   ? Diabetes Other   ? Hypertension Other   ? CAD Other   ? Heart disease Father   ? Colon cancer Neg Hx   ? Rectal cancer Neg Hx   ? Stomach cancer Neg Hx   ? Esophageal cancer Neg Hx   ? ? ?Social History  ? ?Socioeconomic History  ? Marital status: Married  ?  Spouse name: Not on file  ? Number of children: 3  ? Years of education: Not on file  ? Highest education level: Not on file  ?Occupational History  ? Occupation: disabled  ?Tobacco Use  ? Smoking status: Former  ?  Packs/day: 0.50  ?  Years: 30.00  ?  Pack years: 15.00  ?  Types: Cigarettes  ?  Quit date: 01/31/2021  ?  Years since quitting: 1.0  ? Smokeless tobacco: Never  ? Tobacco comments:  ?  quit mid april 2022  ?Vaping Use  ? Vaping Use: Never used  ?Substance and Sexual Activity  ? Alcohol use: Not Currently  ?  Comment: occasional wine  ? Drug use: No  ? Sexual activity: Yes  ?  Birth control/protection: None  ?  Comment: Hysterectomy  ?Other Topics Concern  ?  Not on file  ?Social History Narrative  ? Not on file  ? ?Social Determinants of Health  ? ?Financial Resource Strain: Not on file  ?Food Insecurity: Not on file  ?Transportation Needs: Not on file  ?Physical Activity: Not on file  ?Stress: Not on file  ?Social Connections: Not on file  ?Intimate Partner Violence: Not on file  ? ? ?Outpatient Medications Prior to Visit  ?Medication Sig Dispense Refill  ? Accu-Chek Softclix Lancets lancets USE AS DIRECTED USE TWO TIMES A DAY 100 each 12  ? acetaminophen (TYLENOL) 500 MG tablet Take 1,000 mg by mouth daily as needed for moderate pain.    ? albuterol (VENTOLIN HFA) 108 (90 Base) MCG/ACT inhaler Inhale 1 - 2 puffs into the lungs every 6  hours as needed for wheezing or shortness of breath. 18 g 0  ? busPIRone (BUSPAR) 5 MG tablet TAKE  1 TABLET(5 MG) BY MOUTH THREE TIMES DAILY (Patient taking differently: Take 5 mg by mouth 3 (three) times daily.) 90 tablet 0  ? cholecalciferol (VITAMIN D3) 25 MCG (1000 UT) tablet Take 5,000 Units by mouth daily.    ? colestipol (COLESTID) 1 g tablet Take 2 tablets (2 g total) by mouth 2 (two) times daily. 120 tablet 0  ? Copper Gluconate 2 MG CAPS Take 4 mg by mouth daily.    ? diclofenac sodium (VOLTAREN) 1 % GEL Apply 2 g topically 4 (four) times daily. (Patient taking differently: Apply 2 g topically daily as needed (for pain).) 2 g 3  ? dicyclomine (BENTYL) 20 MG tablet Take 1 tablet (20 mg total) by mouth in the morning, at noon, in the evening, and at bedtime. (Patient taking differently: Take 20 mg by mouth 4 (four) times daily as needed for spasms.) 120 tablet 3  ? eltrombopag (PROMACTA) 50 MG tablet Take 50 mg by mouth daily.    ? esomeprazole (NEXIUM) 40 MG capsule Take 1 capsule (40 mg total) by mouth 2 (two) times daily. 60 capsule 0  ? famotidine (PEPCID) 20 MG tablet Take 1 tablet (20 mg total) by mouth 2 (two) times daily. 180 tablet 3  ? fluticasone-salmeterol (ADVAIR HFA) 230-21 MCG/ACT inhaler Inhale 2 puffs into the lungs 2 (two) times daily. 12 g 5  ? folic acid (FOLVITE) 1 MG tablet TAKE 1 TABLET(1 MG) BY MOUTH DAILY (Patient taking differently: Take 1 mg by mouth daily.) 90 tablet 2  ? ipratropium-albuterol (DUONEB) 0.5-2.5 (3) MG/3ML SOLN Take 3 mLs by nebulization every 6 hours as needed. 360 mL 0  ? Lidocaine (HM LIDOCAINE PATCH) 4 % PTCH Apply 1 patch topically daily. 7 patch 1  ? nicotine polacrilex (COMMIT) 4 MG lozenge Take 1 lozenge (4 mg total) by mouth as needed for smoking cessation. 108 tablet 3  ? promethazine (PHENERGAN) 25 MG tablet Take 1 tablet (25 mg total) by mouth every 6 (six) hours as needed for refractory nausea / vomiting. 30 tablet 0  ? sulfamethoxazole-trimethoprim (BACTRIM DS) 800-160 MG tablet Take 1 tablet by mouth See admin instructions. Monday, Wednesday,  Friday    ? Tiotropium Bromide Monohydrate (SPIRIVA RESPIMAT) 2.5 MCG/ACT AERS Inhale 2 puffs into the lungs daily. 4 g 5  ? traMADol (ULTRAM) 50 MG tablet Take 1 tablet (50 mg total) by mouth every 6 (six) hours as needed. 12 tablet 0  ? traZODone (DESYREL) 150 MG tablet Take 1 tablet (150 mg total) by mouth at bedtime. For sleep (Patient taking differently: Take 400 mg by mouth at  bedtime. For sleep) 30 tablet 0  ? venlafaxine XR (EFFEXOR-XR) 75 MG 24 hr capsule Take 1 capsule (75 mg total) by mouth daily with breakfast. For depression (Patient taking differently: Take 225 mg by mouth daily with breakfast. For depression) 30 capsule 0  ? cyclobenzaprine (FLEXERIL) 10 MG tablet Take 1 tablet (10 mg total) by mouth 3 (three) times daily as needed for muscle spasms. 30 tablet 0  ? ?No facility-administered medications prior to visit.  ? ? ?Allergies  ?Allergen Reactions  ? Other   ?  "Banana Bag" given to patient at physician's office, severe back pain, SOB  ? Nicotine Rash  ?  Nicotine patch  ? ? ?Review of Systems  ?Constitutional:  Negative for chills, fever and malaise/fatigue.  ?Respiratory:  Negative for cough and shortness of breath.   ?Cardiovascular:  Negative for chest pain, palpitations and leg swelling.  ?Gastrointestinal:  Negative for abdominal pain, blood in stool, constipation, diarrhea, nausea and vomiting.  ?Musculoskeletal:  Positive for back pain. Negative for falls, joint pain, myalgias and neck pain.  ?Skin: Negative.   ?Neurological:  Positive for tingling and sensory change. Negative for tremors, speech change, focal weakness, seizures, loss of consciousness, weakness and headaches.  ?Psychiatric/Behavioral:  Negative for depression. The patient is not nervous/anxious.   ?All other systems reviewed and are negative. ? ?   ?Objective:  ?  ?Physical Exam ?Vitals reviewed.  ?Constitutional:   ?   General: She is not in acute distress. ?   Appearance: Normal appearance. She is obese.  ?    Comments: Patient appears to be in increased pain with frequent grimacing with ambulation  ?HENT:  ?   Head: Normocephalic.  ?Cardiovascular:  ?   Rate and Rhythm: Normal rate and regular rhythm.  ?   Pulses: Normal

## 2022-02-02 ENCOUNTER — Telehealth: Payer: Self-pay

## 2022-02-03 ENCOUNTER — Encounter: Payer: Self-pay | Admitting: Family

## 2022-02-03 ENCOUNTER — Telehealth: Payer: Self-pay

## 2022-02-03 ENCOUNTER — Ambulatory Visit (INDEPENDENT_AMBULATORY_CARE_PROVIDER_SITE_OTHER): Payer: Medicaid Other | Admitting: Family

## 2022-02-03 DIAGNOSIS — M5442 Lumbago with sciatica, left side: Secondary | ICD-10-CM

## 2022-02-03 MED ORDER — HYDROCODONE-ACETAMINOPHEN 5-325 MG PO TABS: 1.0000 | ORAL_TABLET | Freq: Four times a day (QID) | ORAL | 0 refills | Status: DC | PRN

## 2022-02-03 NOTE — Addendum Note (Signed)
Addended by: Suzan Slick on: 02/03/2022 04:58 PM ? ? Modules accepted: Orders ? ?

## 2022-02-03 NOTE — Progress Notes (Signed)
? ?Office Visit Note ?  ?Patient: Barbara Stanley           ?Date of Birth: 25-Jun-1971           ?MRN: [id number] ?Visit Date: 02/03/2022 ?             ?Requested by: Bo Merino I, NP ?[address] ?[place],  [place] [address] ?PCP: Teena Dunk, NP ? ?Chief Complaint  ?Patient presents with  ? Lower Back - Pain  ? ? ? ? ?HPI: ?The patient is a 51 year old woman who presents today for initial evaluation of chronic lumbar pain with radicular symptoms bilaterally.  Left worse than right.  Having left sided back pain this radiates into her hip and but some numbness in her left foot. she has not had any associated injury about 3 weeks ago she was placed on a steroid taper course for her symptoms which seem to improve them then unfortunately she sneezed and had immediate onset of sharp pain worsening symptoms at that time she has been placed on a another course of prednisone for this which has not alleviated her symptoms she is also been using Robaxin, gabapentin, tramadol and lido patch without any relief. ? ?She is having significant discomfort difficulty sleeping difficulty getting comfortable due to pain visibly distressed in the office. ? ?Denies any red flag symptoms no loss of control of bowel or bladder no weakness ? ?Assessment & Plan: ?Visit Diagnoses: No diagnosis found. ? ?Plan: We will send her for an MRI of her lumbar spine as well as get her set up for an ESI with Dr. Ernestina Patches.  We will have her follow-up with Dr. Lorin Mercy after her ESI. ? ?Follow-Up Instructions: Return mri review c yates.  ? ?Back Exam  ? ?Tenderness  ?The patient is experiencing tenderness in the lumbar. ? ?Muscle Strength  ?The patient has normal back strength. ? ?Tests  ?Straight leg raise right: positive ?Straight leg raise left: positive ? ?Other  ?Gait: normal  ?Erythema: no back redness ? ? ? ? ?Patient is alert, oriented, no adenopathy, well-dressed, normal affect, normal respiratory effort. ? ? ?Imaging: ?No results  found. ?No images are attached to the encounter. ? ?Labs: ?Lab Results  ?Component Value Date  ? HGBA1C 5.0 07/09/2020  ? HGBA1C 5.9 (A) 04/01/2020  ? HGBA1C 5.9 04/01/2020  ? HGBA1C 5.9 04/01/2020  ? HGBA1C 5.9 04/01/2020  ? ESRSEDRATE 4 12/31/2019  ? ESRSEDRATE 6 09/28/2018  ? ESRSEDRATE 5 09/05/2018  ? CRP <0.1 (L) 09/05/2018  ? LABURIC 4.8 12/31/2019  ? LABURIC 4.8 08/15/2018  ? REPTSTATUS 12/22/2021 FINAL 12/17/2021  ? REPTSTATUS 12/22/2021 FINAL 12/17/2021  ? CULT  12/17/2021  ?  NO GROWTH 5 DAYS ?Performed at [facility], [place] [address] [place], [place] [address] ?  ? CULT  12/17/2021  ?  NO GROWTH 5 DAYS ?Performed at [facility], [place] [address], [place], [place] [address] ?  ? ? ? ?Lab Results  ?Component Value Date  ? ALBUMIN 3.3 (L) 12/17/2021  ? ALBUMIN 3.8 09/10/2021  ? ALBUMIN 4.5 04/20/2021  ? ? ?Lab Results  ?Component Value Date  ? MG 1.9 12/19/2021  ? MG 1.8 09/21/2018  ? MG 1.8 08/31/2018  ? ?Lab Results  ?Component Value Date  ? VD25OH 28.12 (L) 03/05/2020  ? VD25OH 36.37 11/07/2019  ? VD25OH 62.93 07/19/2019  ? ? ?No results found for: PREALBUMIN ? ?  Latest Ref Rng & Units 12/19/2021  ?  2:57 AM 12/18/2021  ?  3:13 AM 12/17/2021  ?  6:02 PM  ?CBC EXTENDED  ?WBC 4.0 - 10.5 K/uL 3.1   3.2     ?RBC 3.87 - 5.11 MIL/uL 3.17   3.08     ?Hemoglobin 12.0 - 15.0 g/dL 9.5   9.0   10.2    ?HCT 36.0 - 46.0 % 30.5   29.3   30.0    ?Platelets 150 - 400 K/uL 43   38     ?NEUT# 1.7 - 7.7 K/uL 2.3      ?Lymph# 0.7 - 4.0 K/uL 0.3      ? ? ? ?There is no height or weight on file to calculate BMI. ? ?Orders:  ?No orders of the defined types were placed in this encounter. ? ?No orders of the defined types were placed in this encounter. ? ? ? Procedures: ?No procedures performed ? ?Clinical Data: ?No additional findings. ? ?ROS: ? ?All other systems negative, except as noted in the HPI. ?Review of Systems ? ?Objective: ?Vital Signs: LMP  (LMP Unknown)  ? ?Specialty Comments:  ?No specialty comments  available. ? ?PMFS History: ?Patient Active Problem List  ? Diagnosis Date Noted  ? History of acute respiratory failure 01/03/2022  ? Acute exacerbation of chronic obstructive pulmonary disease (COPD) ([place]) 12/17/2021  ? Malnutrition, unspecified type ([place]) 04/01/2021  ? Former smoker 02/09/2021  ? Healthcare maintenance 10/12/2020  ? Iron deficiency anemia 11/12/2019  ? Splenomegaly, congestive, chronic 09/05/2019  ? Chronic ITP (idiopathic thrombocytopenia) ([facility]) 08/28/2019  ? Urinary incontinence, mixed 08/13/2019  ? MDS (myelodysplastic syndrome), low grade ([place]) 07/26/2019  ? Vitamin D deficiency 10/08/2018  ? Vitamin B12 deficiency due to intestinal malabsorption 10/08/2018  ? 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 Dietary copper deficiency 09/27/2018  ? Other pancytopenia ([place]) 09/22/2018  ? Thrombocytopenia ([place]) 08/08/2018  ? MDD (major depressive disorder), single episode, severe with psychosis ([place]) 11/05/2014  ? PTSD (post-traumatic stress disorder) 11/05/2014  ? Depressive disorder 06/04/2014  ? Depression with anxiety 06/04/2014  ? Major depression, recurrent ([place]) 03/21/2014  ? Suicide attempt ([place]) 03/21/2014  ? Suicidal ideations 03/21/2014  ? MDD (major depressive disorder) 03/21/2014  ? Nonspecific abnormal unspecified cardiovascular function study 09/30/2013  ? Tobacco use disorder 09/30/2013  ? COPD (chronic obstructive pulmonary disease) ([place])   ? GERD (gastroesophageal reflux disease)   ? Anemia   ? ?Past Medical History:  ?Diagnosis Date  ? Abdominal pain   ? Anemia   ? Anxiety   ? Bronchitis   ? COPD (chronic obstructive pulmonary disease) ([place])   ? Depression   ? bipolar  ? Diarrhea   ? Emphysema   ? GERD (gastroesophageal reflux disease)   ? History of blood transfusion   ? MDS (myelodysplastic syndrome) ([place])   ? Splenomegaly   ? r/t low platelets  ? Thrombocytopenia ([place])   ? Tobacco abuse   ?  ?Family History  ?Problem Relation Age of Onset  ? Diabetes Mother   ? Diabetes Other   ?  Hypertension Other   ? CAD Other   ? Heart disease Father   ? Colon cancer Neg Hx   ? Rectal cancer Neg Hx   ? Stomach cancer Neg Hx   ? Esophageal cancer Neg Hx   ?  ?Past Surgical History:  ?Procedure Laterality Date  ? ABDOMINAL HYSTERECTOMY    ? c section x 3     ? CHOLECYSTECTOMY    ? GASTRIC BYPASS    ?  UPPER GASTROINTESTINAL ENDOSCOPY    ? WISDOM TOOTH EXTRACTION    ? ?Social History  ? ?Occupational History  ? Occupation: disabled  ?Tobacco Use  ? Smoking status: Former  ?  Packs/day: 0.50  ?  Years: 30.00  ?  Pack years: 15.00  ?  Types: Cigarettes  ?  Quit date: 01/31/2021  ?  Years since quitting: 1.0  ? Smokeless tobacco: Never  ? Tobacco comments:  ?  quit mid april 2022  ?Vaping Use  ? Vaping Use: Never used  ?Substance and Sexual Activity  ? Alcohol use: Not Currently  ?  Comment: occasional wine  ? Drug use: No  ? Sexual activity: Yes  ?  Birth control/protection: None  ?  Comment: Hysterectomy  ? ? ? ? ? ?

## 2022-02-09 ENCOUNTER — Ambulatory Visit: Payer: Medicaid Other | Attending: Nurse Practitioner

## 2022-02-09 DIAGNOSIS — M5441 Lumbago with sciatica, right side: Secondary | ICD-10-CM | POA: Insufficient documentation

## 2022-02-09 DIAGNOSIS — M6281 Muscle weakness (generalized): Secondary | ICD-10-CM | POA: Diagnosis present

## 2022-02-09 DIAGNOSIS — M5442 Lumbago with sciatica, left side: Secondary | ICD-10-CM | POA: Insufficient documentation

## 2022-02-09 DIAGNOSIS — G8929 Other chronic pain: Secondary | ICD-10-CM | POA: Diagnosis present

## 2022-02-09 NOTE — Therapy (Signed)
?OUTPATIENT PHYSICAL THERAPY THORACOLUMBAR EVALUATION ? ? ?Patient Name: Barbara Stanley ?MRN: [id number] ?DOB:1971-05-05, 51 y.o., female ?Today's Date: 02/09/2022 ? ? PT End of Session - 02/09/22 1308   ? ? Visit Number 1   ? Number of Visits 15   ? Authorization Type [place] MCD   ? PT Start Time 1310   ? PT Stop Time 1400   ? PT Time Calculation (min) 50 min   ? Activity Tolerance Patient tolerated treatment well   ? Behavior During Therapy [facility] for tasks assessed/performed   ? ?  ?  ? ?  ? ? ?Past Medical History:  ?Diagnosis Date  ? Abdominal pain   ? Anemia   ? Anxiety   ? Bronchitis   ? COPD (chronic obstructive pulmonary disease) ([place])   ? Depression   ? bipolar  ? Diarrhea   ? Emphysema   ? GERD (gastroesophageal reflux disease)   ? History of blood transfusion   ? MDS (myelodysplastic syndrome) ([place])   ? Splenomegaly   ? r/t low platelets  ? Thrombocytopenia ([place])   ? Tobacco abuse   ? ?Past Surgical History:  ?Procedure Laterality Date  ? ABDOMINAL HYSTERECTOMY    ? c section x 3     ? CHOLECYSTECTOMY    ? GASTRIC BYPASS    ? UPPER GASTROINTESTINAL ENDOSCOPY    ? WISDOM TOOTH EXTRACTION    ? ?Patient Active Problem List  ? Diagnosis Date Noted  ? History of acute respiratory failure 01/03/2022  ? Acute exacerbation of chronic obstructive pulmonary disease (COPD) ([place]) 12/17/2021  ? Malnutrition, unspecified type ([place]) 04/01/2021  ? Former smoker 02/09/2021  ? Healthcare maintenance 10/12/2020  ? Iron deficiency anemia 11/12/2019  ? Splenomegaly, congestive, chronic 09/05/2019  ? Chronic ITP (idiopathic thrombocytopenia) ([facility]) 08/28/2019  ? Urinary incontinence, mixed 08/13/2019  ? MDS (myelodysplastic syndrome), low grade ([place]) 07/26/2019  ? Vitamin D deficiency 10/08/2018  ? Vitamin B12 deficiency due to intestinal malabsorption 10/08/2018  ? 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 Dietary copper deficiency 09/27/2018  ? Other pancytopenia ([place]) 09/22/2018  ? Thrombocytopenia ([place]) 08/08/2018  ? MDD (major depressive  disorder), single episode, severe with psychosis ([place]) 11/05/2014  ? PTSD (post-traumatic stress disorder) 11/05/2014  ? Depressive disorder 06/04/2014  ? Depression with anxiety 06/04/2014  ? Major depression, recurrent ([place]) 03/21/2014  ? Suicide attempt ([place]) 03/21/2014  ? Suicidal ideations 03/21/2014  ? MDD (major depressive disorder) 03/21/2014  ? Nonspecific abnormal unspecified cardiovascular function study 09/30/2013  ? Tobacco use disorder 09/30/2013  ? COPD (chronic obstructive pulmonary disease) ([place])   ? GERD (gastroesophageal reflux disease)   ? Anemia   ? ? ?PCP: Bo Merino I, NP ? ?REFERRING PROVIDER: Bo Merino I, NP ? ?REFERRING DIAG: M54.41,M54.42,G89.29 (ICD-10-CM) - Chronic left-sided low back pain with bilateral sciatica  ? ?THERAPY DIAG: Chronic left-sided low back pain with bilateral sciatica  ? ? ?ONSET DATE: 1 month ? ?SUBJECTIVE:                                                                                                                                                                                          ? ?  SUBJECTIVE STATEMENT: ?Hospitalized for 3 days due to COPD, received steroids, felt much improved and then overextended with housework, then sneezed and felt immediate sharp pain in low back. ?PERTINENT HISTORY:  ?The patient is a 51 year old woman who presents today for initial evaluation of chronic lumbar pain with radicular symptoms bilaterally.  Left worse than right.  Having left sided back pain this radiates into her hip and but some numbness in her left foot. she has not had any associated injury about 3 weeks ago she was placed on a steroid taper course for her symptoms which seem to improve them then unfortunately she sneezed and had immediate onset of sharp pain worsening symptoms at that time she has been placed on a another course of prednisone for this which has not alleviated her symptoms she is also been using Robaxin, gabapentin, tramadol and lido  patch without any relief. ?  ?She is having significant discomfort difficulty sleeping difficulty getting comfortable due to pain visibly distressed in the office. ?  ?Denies any red flag symptoms no loss of control of bowel or bladder no weakness ?Patient states that she injured her back again four days ago. Had some relief after last visit with prednisone, toradol and muscle relaxant. Patient states that she was standing in the kitchen drinking coffee, when she coughed causing a significant spasm in back. Patient states that pain is in bilateral lower back and most pronounced in the left lower back and radiates down LLE. ?  ?Patient went to urgent care and was given tramadol and referred to PCP for referral to PT and additional management. Patient denies any recent trauma or injury. Patient states that pain worsens with prolonged standing and/ sitting. Describes pain as throbbing/ shooting with some numbness in the LLE. Denies any other concerns today. ?PAIN:  ?Are you having pain? Yes: NPRS scale: 8/10 ?Pain location: low back ?Pain description: sharp ?Aggravating factors: activity, lying supine ?Relieving factors: ice ? ? ?PRECAUTIONS: None ? ?WEIGHT BEARING RESTRICTIONS No ? ?FALLS:  ?Has patient fallen in last 6 months? No ? ?LIVING ENVIRONMENT: ?Lives with: lives with their family ?Lives in: House/apartment ?Stairs: Yes: Internal: 4 steps; rails ? ?OCCUPATION: not working ? ?PLOF: Independent ? ?PATIENT GOALS : To reduce and manage my back pain ? ? ?OBJECTIVE:  ? ?DIAGNOSTIC FINDINGS:  ?COMPARISON:  04/09/2020 ?  ?FINDINGS: ?The bones appear diffusely osteopenic. Compression deformities are ?noted involving the superior endplate of Z76 and L2. The T12 ?compression deformity results in approximately 20% vertebral body ?height loss. Approximately 15% vertebral body height loss is noted ?at the L2 level. These are new when compared with 10/01/21. ?degenerative disc disease is identified at L5-S1.  Aortic ?atherosclerotic calcifications. ?  ?IMPRESSION: ?1. Mild superior endplate compression fractures are identified at ?T12 and L2. These are new when compared with 10/01/2021. ?2. Osteopenia. ?3. L5-S1 degenerative disc disease. ?  ?  ?Electronically Signed ?  By: Kerby Moors M.D. ?  On: 01/28/2022 16:02 ? ?PATIENT SURVEYS:  ?Modified Oswestry 27/50  ? ?SCREENING FOR RED FLAGS: ?Bowel or bladder incontinence: No ?Mild superior endplate compression fractures are identified at ?T12 and L2. These are new when compared with 10/01/2021. ? ?COGNITION: ? Overall cognitive status: Within functional limits for tasks assessed   ?  ?SENSATION: ?WFL ? ?MUSCLE LENGTH: ?Hamstrings: Right 80 deg; Left 80 deg ? ?POSTURE:  ?Rounded shoulder, decreased lordosis ? ?PALPATION: ?unremarkable ? ?LUMBAR ROM:  ? ?Active  A/PROM  ?02/09/2022  ?Flexion 50%*  ?Extension 50%  ?  Right lateral flexion   ?Left lateral flexion   ?Right rotation   ?Left rotation   ? (* limited by pain) ? ?LE ROM: ? ?Active  Right ?02/09/2022 Left ?02/09/2022  ?Hip flexion [facility] WFL  ?Hip extension    ?Hip abduction    ?Hip adduction    ?Hip internal rotation    ?Hip external rotation    ?Knee flexion [facility] WFL  ?Knee extension [facility] WFL  ?Ankle dorsiflexion    ?Ankle plantarflexion    ?Ankle inversion    ?Ankle eversion    ? (Blank rows = not tested) ? ?LE MMT: ? ?MMT Right ?02/09/2022 Left ?02/09/2022  ?Hip flexion 4* 4*  ?Hip extension 4* 4*  ?Hip abduction    ?Hip adduction    ?Hip internal rotation    ?Hip external rotation    ?Knee flexion 4* 4*  ?Knee extension 4* 4*  ?Ankle dorsiflexion    ?Ankle plantarflexion    ?Ankle inversion    ?Ankle eversion    ? (* limited by pain) ? ?LUMBAR SPECIAL TESTS:  ?Straight leg raise test: Negative and Slump test: Negative ? ?FUNCTIONAL TESTS:  ?30 seconds chair stand test ? ?GAIT: ?Distance walked: 68f x2 ?Assistive device utilized: None ?Level of assistance: Complete Independence ? ?TODAY'S TREATMENT  ?Eval and  HEP ? ? ?PATIENT EDUCATION:  ?Education details: Discussed eval findings, rehab rationale and POC and patient is in agreement  ?Person educated: Patient ?Education method: Explanation ?Education comprehension: verbalized understandi

## 2022-02-09 NOTE — Telephone Encounter (Signed)
No additional notes needed  

## 2022-02-10 ENCOUNTER — Telehealth: Payer: Self-pay | Admitting: Family

## 2022-02-10 NOTE — Telephone Encounter (Signed)
Patient called. She would like a refill on hydrocodone. Her call back number is [phone] ?

## 2022-02-10 NOTE — Telephone Encounter (Signed)
no additional notes needed ?

## 2022-02-10 NOTE — Telephone Encounter (Signed)
I called pt and advised that she had received  rx on 02/03/22 and this was too soon to refill. She is scheduled for MRI on Sunday and advised that we will await the results from exam and see how to best treat the pt. Voiced understanding and will call with  questions.  ?

## 2022-02-12 ENCOUNTER — Ambulatory Visit
Admission: RE | Admit: 2022-02-12 | Discharge: 2022-02-12 | Disposition: A | Discharge status: Home / Self Care | Payer: Medicaid Other | Source: Ambulatory Visit | Attending: Family | Admitting: Family

## 2022-02-12 DIAGNOSIS — M5442 Lumbago with sciatica, left side: Secondary | ICD-10-CM

## 2022-02-13 ENCOUNTER — Other Ambulatory Visit: Payer: Medicaid Other

## 2022-02-14 ENCOUNTER — Telehealth: Payer: Self-pay | Admitting: Family

## 2022-02-14 NOTE — Telephone Encounter (Signed)
Pt called and is wondering the results to her mri. She is wondering if she can get something for the pain..  ? ?CB [phone] ?

## 2022-02-14 NOTE — Telephone Encounter (Signed)
Barbara Stanley, can you please advise? ?

## 2022-02-15 ENCOUNTER — Other Ambulatory Visit: Payer: Self-pay | Admitting: Family

## 2022-02-15 DIAGNOSIS — G8929 Other chronic pain: Secondary | ICD-10-CM

## 2022-02-15 MED ORDER — HYDROCODONE-ACETAMINOPHEN 5-325 MG PO TABS: 1.0000 | ORAL_TABLET | Freq: Four times a day (QID) | ORAL | 0 refills | Status: DC | PRN

## 2022-02-15 MED ORDER — METHOCARBAMOL 500 MG PO TABS: 500.0000 mg | ORAL_TABLET | Freq: Four times a day (QID) | ORAL | 0 refills | Status: DC | PRN

## 2022-02-23 ENCOUNTER — Ambulatory Visit: Payer: Medicaid Other

## 2022-02-23 ENCOUNTER — Other Ambulatory Visit: Payer: Self-pay | Admitting: Family

## 2022-02-23 DIAGNOSIS — G8929 Other chronic pain: Secondary | ICD-10-CM

## 2022-02-25 MED ORDER — HYDROCODONE-ACETAMINOPHEN 5-325 MG PO TABS: 1.0000 | ORAL_TABLET | Freq: Four times a day (QID) | ORAL | 0 refills | Status: DC | PRN

## 2022-03-02 ENCOUNTER — Encounter: Payer: Self-pay | Admitting: Physical Medicine & Rehabilitation

## 2022-03-02 ENCOUNTER — Encounter: Payer: Self-pay | Admitting: Orthopaedic Surgery

## 2022-03-02 ENCOUNTER — Ambulatory Visit: Payer: Medicaid Other | Attending: Nurse Practitioner

## 2022-03-02 ENCOUNTER — Ambulatory Visit: Payer: Self-pay

## 2022-03-02 ENCOUNTER — Ambulatory Visit (INDEPENDENT_AMBULATORY_CARE_PROVIDER_SITE_OTHER): Payer: Medicaid Other | Admitting: Orthopaedic Surgery

## 2022-03-02 VITALS — BP 113/75 | HR 83 | Ht 66.0 in | Wt 220.0 lb

## 2022-03-02 DIAGNOSIS — M5441 Lumbago with sciatica, right side: Secondary | ICD-10-CM

## 2022-03-02 DIAGNOSIS — M6281 Muscle weakness (generalized): Secondary | ICD-10-CM | POA: Diagnosis present

## 2022-03-02 DIAGNOSIS — M5442 Lumbago with sciatica, left side: Secondary | ICD-10-CM | POA: Insufficient documentation

## 2022-03-02 DIAGNOSIS — M810 Age-related osteoporosis without current pathological fracture: Secondary | ICD-10-CM

## 2022-03-02 DIAGNOSIS — G8929 Other chronic pain: Secondary | ICD-10-CM | POA: Insufficient documentation

## 2022-03-02 NOTE — Therapy (Addendum)
OUTPATIENT PHYSICAL THERAPY TREATMENT NOTE/DC SUMMARY   Patient Name: Barbara Stanley MRN: [id number] DOB:12-22-1970, 51 y.o., female Today's Date: 03/02/2022  PCP: Teena Dunk, NP REFERRING PROVIDER: Bo Merino I, NP PHYSICAL THERAPY DISCHARGE SUMMARY  Visits from Start of Care: 2  Current functional level related to goals / functional outcomes: UTA   Remaining deficits: UTA   Education / Equipment: HEP   Patient agrees to discharge. Patient goals were partially met. Patient is being discharged due to not returning since the last visit.  END OF SESSION:   PT End of Session - 03/02/22 1445     Visit Number 2    Number of Visits 6    Date for PT Re-Evaluation 04/02/22    Authorization Type [place] [facility]    PT Start Time 1445    PT Stop Time 1525    PT Time Calculation (min) 40 min    Activity Tolerance Patient tolerated treatment well    Behavior During Therapy WFL for tasks assessed/performed             Past Medical History:  Diagnosis Date   Abdominal pain    Anemia    Anxiety    Bronchitis    COPD (chronic obstructive pulmonary disease) ([place])    Depression    bipolar   Diarrhea    Emphysema    GERD (gastroesophageal reflux disease)    History of blood transfusion    MDS (myelodysplastic syndrome) ([place])    Splenomegaly    r/t low platelets   Thrombocytopenia (HCC)    Tobacco abuse    Past Surgical History:  Procedure Laterality Date   ABDOMINAL HYSTERECTOMY     c section x 3      CHOLECYSTECTOMY     GASTRIC BYPASS     UPPER GASTROINTESTINAL ENDOSCOPY     WISDOM TOOTH EXTRACTION     Patient Active Problem List   Diagnosis Date Noted   History of acute respiratory failure 01/03/2022   Acute exacerbation of chronic obstructive pulmonary disease (COPD) ([place]) 12/17/2021   Malnutrition, unspecified type ([place]) 04/01/2021   Former smoker 02/09/2021   Healthcare maintenance 10/12/2020   Iron deficiency anemia 11/12/2019   Splenomegaly,  congestive, chronic 09/05/2019   Chronic ITP (idiopathic thrombocytopenia) ([place]) 08/28/2019   Urinary incontinence, mixed 08/13/2019   MDS (myelodysplastic syndrome), low grade ([place]) 07/26/2019   Vitamin D deficiency 10/08/2018   Vitamin B12 deficiency due to intestinal malabsorption 10/08/2018   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/27/2018   Dietary copper deficiency 09/27/2018   Other pancytopenia ([place]) 09/22/2018   Thrombocytopenia ([place]) 08/08/2018   MDD (major depressive disorder), single episode, severe with psychosis ([place]) 11/05/2014   PTSD (post-traumatic stress disorder) 11/05/2014   Depressive disorder 06/04/2014   Depression with anxiety 06/04/2014   Major depression, recurrent ([place]) 03/21/2014   Suicide attempt ([place]) 03/21/2014   Suicidal ideations 03/21/2014   MDD (major depressive disorder) 03/21/2014   Nonspecific abnormal unspecified cardiovascular function study 09/30/2013   Tobacco use disorder 09/30/2013   COPD (chronic obstructive pulmonary disease) (HCC)    GERD (gastroesophageal reflux disease)    Anemia     REFERRING DIAG: M54.41,M54.42,G89.29 (ICD-10-CM) - Chronic left-sided low back pain with bilateral sciatica   THERAPY DIAG: Chronic left-sided low back pain with bilateral sciatica  Chronic left-sided low back pain with bilateral sciatica  Muscle weakness (generalized)  PERTINENT HISTORY: The patient is a 51 year old woman who presents today for initial evaluation of chronic lumbar pain with radicular symptoms bilaterally.  Left worse than right.  Having left sided back pain this radiates into her hip and but some numbness in her left foot. she has not had any associated injury about 3 weeks ago she was placed on a steroid taper course for her symptoms which seem to improve them then unfortunately she sneezed and had immediate onset of sharp pain worsening symptoms at that time she has been placed on a another course of prednisone for this which has not alleviated her  symptoms she is also been using Robaxin, gabapentin, tramadol and lido patch without any relief.   She is having significant discomfort difficulty sleeping difficulty getting comfortable due to pain visibly distressed in the office.   Denies any red flag symptoms no loss of control of bowel or bladder no weakness Patient states that she injured her back again four days ago. Had some relief after last visit with prednisone, toradol and muscle relaxant. Patient states that she was standing in the kitchen drinking coffee, when she coughed causing a significant spasm in back. Patient states that pain is in bilateral lower back and most pronounced in the left lower back and radiates down LLE.   Patient went to urgent care and was given tramadol and referred to PCP for referral to PT and additional management. Patient denies any recent trauma or injury. Patient states that pain worsens with prolonged standing and/ sitting. Describes pain as throbbing/ shooting with some numbness in the LLE. Denies any other concerns today.  PRECAUTIONS: None  SUBJECTIVE: Overall pain is getting better, saw Dr. Lorin Mercy and has had imaging studies showing pain resulting from compression fxs, will be referred out for osteoporosis treatments.  Discussed spreading out pain meds by halving pills to maintain more consistent levels.   PAIN:  Are you having pain? Yes: NPRS scale: 8/10 Pain location: central spine Pain description: ache Aggravating factors: supine Relieving factors: rest   OBJECTIVE: (objective measures completed at initial evaluation unless otherwise dated)   DIAGNOSTIC FINDINGS:  COMPARISON:  04/09/2020   FINDINGS: The bones appear diffusely osteopenic. Compression deformities are noted involving the superior endplate of Z61 and L2. The T12 compression deformity results in approximately 20% vertebral body height loss. Approximately 15% vertebral body height loss is noted at the L2 level. These are  new when compared with 10/01/21. degenerative disc disease is identified at L5-S1. Aortic atherosclerotic calcifications.   IMPRESSION: 1. Mild superior endplate compression fractures are identified at T12 and L2. These are new when compared with 10/01/2021. 2. Osteopenia. 3. L5-S1 degenerative disc disease.     Electronically Signed   By: Kerby Moors M.D.   On: 01/28/2022 16:02  CLINICAL DATA:  Lumbar radiculopathy, right leg weakness.   EXAM: MRI LUMBAR SPINE WITHOUT CONTRAST   TECHNIQUE: Multiplanar, multisequence MR imaging of the lumbar spine was performed. No intravenous contrast was administered.   COMPARISON:  Multiple exams, including radiographs from 01/28/2022   FINDINGS: Despite efforts by the technologist and patient, motion artifact is present on today's exam and could not be eliminated. This reduces exam sensitivity and specificity.   Segmentation: The lowest lumbar type non-rib-bearing vertebra is labeled as L5.   Alignment:  No vertebral subluxation is observed.   Vertebrae: Subacute superior endplate compression fractures are observed T11, T12, L1, and L2, ranging from about 10% to about 25% loss of vertebral body height, with associated edema along the superior endplate compressions. The L1 compression is minor and mostly eccentric to the right. Suspected 2 mm posterior bony  retropulsion at the T12 level.   There is also a subtle superior endplate compression at L4 which is chronic at about 15% loss of vertebral body height.   Conus medullaris and cauda equina: Conus extends to the L1-2 level. Conus and cauda equina appear normal.   Paraspinal and other soft tissues: Unremarkable   Disc levels:   T11-12: No impingement. 2 mm posterior retropulsion from the posterosuperior endplate of V61.   Y07-P7: Unremarkable   L1-2: No impingement.  Mild disc bulge.   L2-3: Unremarkable   L3-4: No impingement.  Mild disc bulge.   L4-5: No  impingement.  Mild disc bulge.   L5-S1: Mild left subarticular lateral recess stenosis due to intervertebral spurring and disc bulge. Conjoined left L5-S1 nerve roots are contributory.   IMPRESSION: 1. Mild left subarticular lateral recess stenosis at L5-S1 due to spurring and disc bulge along with conjoined left L5 and S1 nerve roots. The impingement affects the left S1 nerve roots. 2. Subacute superior endplate compression fractures at T11, T12, L1, and L2. This implies potential osteoporosis. Chronic superior endplate compression at L4. PATIENT SURVEYS:  Modified Oswestry 27/50    SCREENING FOR RED FLAGS: Bowel or bladder incontinence: No Mild superior endplate compression fractures are identified at T12 and L2. These are new when compared with 10/01/2021.   COGNITION:           Overall cognitive status: Within functional limits for tasks assessed                          SENSATION: WFL   MUSCLE LENGTH: Hamstrings: Right 80 deg; Left 80 deg   POSTURE:  Rounded shoulder, decreased lordosis   PALPATION: unremarkable   LUMBAR ROM:    Active  A/PROM  02/09/2022  Flexion 50%*  Extension 50%  Right lateral flexion    Left lateral flexion    Right rotation    Left rotation     (* limited by pain)   LE ROM:   Active  Right 02/09/2022 Left 02/09/2022  Hip flexion [facility]  Hip extension      Hip abduction      Hip adduction      Hip internal rotation      Hip external rotation      Knee flexion [facility]  Knee extension [facility]  Ankle dorsiflexion      Ankle plantarflexion      Ankle inversion      Ankle eversion       (Blank rows = not tested)   LE MMT:   MMT Right 02/09/2022 Left 02/09/2022  Hip flexion 4* 4*  Hip extension 4* 4*  Hip abduction      Hip adduction      Hip internal rotation      Hip external rotation      Knee flexion 4* 4*  Knee extension 4* 4*  Ankle dorsiflexion      Ankle plantarflexion      Ankle inversion      Ankle eversion        (* limited by pain)   LUMBAR SPECIAL TESTS:  Straight leg raise test: Negative and Slump test: Negative   FUNCTIONAL TESTS:  30 seconds chair stand test   GAIT: Distance walked: 5f x2 Assistive device utilized: None Level of assistance: Complete Independence   TODAY'S TREATMENT  [facility] Adult PT Treatment:  DATE: 03/02/22 Therapeutic Exercise: UBE L1 3/3(Nustep in use) PPT 10x Side lie clams 15/15 Open book limited ROM 10/10 Seated core exercises of hip tosses, shoulder tosses, chops and Victories, 10 reps with 0# weighted ball  Seated hamstring stretch 30s x3 B     PATIENT EDUCATION:  Education details: Discussed eval findings, rehab rationale and POC and patient is in agreement  Person educated: Patient Education method: Explanation Education comprehension: verbalized understanding, returned demonstration, and needs further education     HOME EXERCISE PROGRAM: Access Code: [email or web address] URL: [email or web address] Date: 02/09/2022 Prepared by: Sharlynn Oliphant   Exercises - Curl Up with Arms Crossed  - 2 x daily - 7 x weekly - 2 sets - 10 reps   ASSESSMENT:   CLINICAL IMPRESSION: Overall pain less, began additional stretching and core strengthening, limited ROM and rotational movement due to compression fxs and worked in painfree range.  Todays focus was introducing core strengthening and promoting flexibility and mobility throughout hips and spine   OBJECTIVE IMPAIRMENTS decreased activity tolerance, decreased knowledge of condition, decreased mobility, decreased ROM, postural dysfunction, and pain.    ACTIVITY LIMITATIONS cleaning, meal prep, and shopping.    PERSONAL FACTORS Fitness and Past/current experiences are also affecting patient's functional outcome.      REHAB POTENTIAL: Good   CLINICAL DECISION MAKING: Stable/uncomplicated   EVALUATION COMPLEXITY: Low     GOALS: Goals reviewed with  patient? Yes   SHORT TERM GOALS: Target date: 03/09/2022   Patient to demonstrate independence in [place]: [id number] Goal status: INITIAL    2.  Decrease pain to 4/10 Baseline: 8/10 Goal status: INITIAL       LONG TERM GOALS: Target date: 03/23/2022   Decrease LEFS sore to 20/50 Baseline: 27/50 Goal status: INITIAL   2.  Increase 30s sit to stand reps to 5 Baseline: 1 Goal status: INITIAL   3.  Increase BLE strength to 4+/5 Baseline: 4/5 Goal status: INITIAL   4.  Increase trunk AROM to 75% Baseline: 50% AROM F/E Goal status: INITIAL     PLAN: PT FREQUENCY: 1x/week   PT DURATION: 6 weeks   PLANNED INTERVENTIONS: Therapeutic exercises, Therapeutic activity, Neuromuscular re-education, Balance training, Gait training, Patient/Family education, Joint mobilization, and Manual therapy.   PLAN FOR NEXT SESSION: Pain level?, assess tolerance to movement and expand HEP, seated core, latissimus press    Lanice Shirts, PT 03/02/2022, 4:04 PM

## 2022-03-02 NOTE — Progress Notes (Signed)
Office Visit Note   Patient: Barbara Stanley           Date of Birth: 25-Sep-1971           MRN: [id number] Visit Date: 03/02/2022              Requested by: Bo Merino I, NP [place] [address], [place],  [place] [address] PCP: Bo Merino I, NP   Assessment & Plan: Visit Diagnoses:  1. Chronic left-sided low back pain with bilateral sciatica     Plan: With both GERD and gastric bypass I am not sure she is a good candidate for oral biphosphonate treatment.  We will refer her to Dr. Kathrin Penner osteoporosis clinic to choose appropriate treatment.  She is already on vitamin D and she will add some calcium with 1 times twice a day.  Follow-Up Instructions: No follow-ups on file.   Orders:  Orders Placed This Encounter  Procedures   XR Lumbar Spine 2-3 Views   No orders of the defined types were placed in this encounter.     Procedures: No procedures performed   Clinical Data: No additional findings.   Subjective: Chief Complaint  Patient presents with   Lower Back - Pain, Follow-up    MRI review    HPI 51 year old female with back pain with MRI documented multiple compression fractures.  She states she originally cough denies any falls or other injuries.  She does have a history of PTSD major depression mild low dysplastic syndrome low-grade, splenomegaly chronic ITP and former smoker.  MRI showed subacute compression fractures at T11, T12, L1, and L2.  Chronic superior endplate compression at L4.  She states she has had a bone density test at her PCP and was told she had osteoporosis.  Patient was given some hydrocodone 5/325 and tries to stretch this out 8 to 9 hours pain is worse at night.  Review of Systems all the systems noncontributory to HPI.  Patient has had gastric bypass surgery and does have history of GERD.   Objective: Vital Signs: BP 113/75   Pulse 83   Ht '5\' 6"'$  (1.676 m)   Wt 220 lb (99.8 kg)   LMP  (LMP Unknown)   BMI 35.51 kg/m    Physical Exam Constitutional:      Appearance: She is well-developed.  HENT:     Head: Normocephalic.     Right Ear: External ear normal.     Left Ear: External ear normal. There is no impacted cerumen.  Eyes:     Pupils: Pupils are equal, round, and reactive to light.  Neck:     Thyroid: No thyromegaly.     Trachea: No tracheal deviation.  Cardiovascular:     Rate and Rhythm: Normal rate.  Pulmonary:     Effort: Pulmonary effort is normal.  Abdominal:     Palpations: Abdomen is soft.  Musculoskeletal:     Cervical back: No rigidity.  Skin:    General: Skin is warm and dry.  Neurological:     Mental Status: She is alert and oriented to person, place, and time.  Psychiatric:        Behavior: Behavior normal.    Ortho Exam patient has normal heel-toe gait.  Neurologically intact lower extremities.  Specialty Comments:  MRI LUMBAR SPINE WITHOUT CONTRAST   TECHNIQUE: Multiplanar, multisequence MR imaging of the lumbar spine was performed. No intravenous contrast was administered.   COMPARISON:  Multiple exams, including radiographs from 01/28/2022  FINDINGS: Despite efforts by the technologist and patient, motion artifact is present on today's exam and could not be eliminated. This reduces exam sensitivity and specificity.   Segmentation: The lowest lumbar type non-rib-bearing vertebra is labeled as L5.   Alignment:  No vertebral subluxation is observed.   Vertebrae: Subacute superior endplate compression fractures are observed T11, T12, L1, and L2, ranging from about 10% to about 25% loss of vertebral body height, with associated edema along the superior endplate compressions. The L1 compression is minor and mostly eccentric to the right. Suspected 2 mm posterior bony retropulsion at the T12 level.   There is also a subtle superior endplate compression at L4 which is chronic at about 15% loss of vertebral body height.   Conus medullaris and cauda equina:  Conus extends to the L1-2 level. Conus and cauda equina appear normal.   Paraspinal and other soft tissues: Unremarkable   Disc levels:   T11-12: No impingement. 2 mm posterior retropulsion from the posterosuperior endplate of O24.   M35-T6: Unremarkable   L1-2: No impingement.  Mild disc bulge.   L2-3: Unremarkable   L3-4: No impingement.  Mild disc bulge.   L4-5: No impingement.  Mild disc bulge.   L5-S1: Mild left subarticular lateral recess stenosis due to intervertebral spurring and disc bulge. Conjoined left L5-S1 nerve roots are contributory.   IMPRESSION: 1. Mild left subarticular lateral recess stenosis at L5-S1 due to spurring and disc bulge along with conjoined left L5 and S1 nerve roots. The impingement affects the left S1 nerve roots. 2. Subacute superior endplate compression fractures at T11, T12, L1, and L2. This implies potential osteoporosis. Chronic superior endplate compression at L4.     Electronically Signed   By: Van Clines M.D.   On: 02/14/2022 11:57  Imaging: No results found.   PMFS History: Patient Active Problem List   Diagnosis Date Noted   History of acute respiratory failure 01/03/2022   Acute exacerbation of chronic obstructive pulmonary disease (COPD) ([place]) 12/17/2021   Malnutrition, unspecified type ([place]) 04/01/2021   Former smoker 02/09/2021   Healthcare maintenance 10/12/2020   Iron deficiency anemia 11/12/2019   Splenomegaly, congestive, chronic 09/05/2019   Chronic ITP (idiopathic thrombocytopenia) ([place]) 08/28/2019   Urinary incontinence, mixed 08/13/2019   MDS (myelodysplastic syndrome), low grade ([place]) 07/26/2019   Vitamin D deficiency 10/08/2018   Vitamin B12 deficiency due to intestinal malabsorption 10/08/2018   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/27/2018   Dietary copper deficiency 09/27/2018   Other pancytopenia ([place]) 09/22/2018   Thrombocytopenia ([place]) 08/08/2018   MDD (major depressive disorder), single episode,  severe with psychosis ([place]) 11/05/2014   PTSD (post-traumatic stress disorder) 11/05/2014   Depressive disorder 06/04/2014   Depression with anxiety 06/04/2014   Major depression, recurrent ([place]) 03/21/2014   Suicide attempt ([place]) 03/21/2014   Suicidal ideations 03/21/2014   MDD (major depressive disorder) 03/21/2014   Nonspecific abnormal unspecified cardiovascular function study 09/30/2013   Tobacco use disorder 09/30/2013   COPD (chronic obstructive pulmonary disease) (HCC)    GERD (gastroesophageal reflux disease)    Anemia    Past Medical History:  Diagnosis Date   Abdominal pain    Anemia    Anxiety    Bronchitis    COPD (chronic obstructive pulmonary disease) (HCC)    Depression    bipolar   Diarrhea    Emphysema    GERD (gastroesophageal reflux disease)    History of blood transfusion    MDS (myelodysplastic syndrome) ([place])  Splenomegaly    r/t low platelets   Thrombocytopenia (HCC)    Tobacco abuse     Family History  Problem Relation Age of Onset   Diabetes Mother    Diabetes Other    Hypertension Other    CAD Other    Heart disease Father    Colon cancer Neg Hx    Rectal cancer Neg Hx    Stomach cancer Neg Hx    Esophageal cancer Neg Hx     Past Surgical History:  Procedure Laterality Date   ABDOMINAL HYSTERECTOMY     c section x 3      CHOLECYSTECTOMY     GASTRIC BYPASS     UPPER GASTROINTESTINAL ENDOSCOPY     WISDOM TOOTH EXTRACTION     Social History   Occupational History   Occupation: disabled  Tobacco Use   Smoking status: Former    Packs/day: 0.50    Years: 30.00    Pack years: 15.00    Types: Cigarettes    Quit date: 01/31/2021    Years since quitting: 1.0   Smokeless tobacco: Never   Tobacco comments:    quit mid april 2022  Vaping Use   Vaping Use: Never used  Substance and Sexual Activity   Alcohol use: Not Currently    Comment: occasional wine   Drug use: No   Sexual activity: Yes    Birth control/protection: None     Comment: Hysterectomy

## 2022-03-03 ENCOUNTER — Other Ambulatory Visit: Payer: Self-pay | Admitting: Gastroenterology

## 2022-03-05 ENCOUNTER — Other Ambulatory Visit: Payer: Self-pay | Admitting: Family

## 2022-03-05 DIAGNOSIS — G8929 Other chronic pain: Secondary | ICD-10-CM

## 2022-03-07 ENCOUNTER — Other Ambulatory Visit: Payer: Self-pay | Admitting: Orthopedic Surgery

## 2022-03-07 ENCOUNTER — Other Ambulatory Visit: Payer: Self-pay | Admitting: Nurse Practitioner

## 2022-03-07 ENCOUNTER — Telehealth: Payer: Self-pay | Admitting: Family

## 2022-03-07 DIAGNOSIS — G629 Polyneuropathy, unspecified: Secondary | ICD-10-CM

## 2022-03-07 DIAGNOSIS — G8929 Other chronic pain: Secondary | ICD-10-CM

## 2022-03-07 MED ORDER — HYDROCODONE-ACETAMINOPHEN 5-325 MG PO TABS: 1.0000 | ORAL_TABLET | Freq: Four times a day (QID) | ORAL | 0 refills | Status: DC | PRN

## 2022-03-07 NOTE — Telephone Encounter (Signed)
Requesting refill on hydrocodone.   °

## 2022-03-09 ENCOUNTER — Ambulatory Visit: Payer: Medicaid Other

## 2022-03-09 NOTE — Telephone Encounter (Signed)
noted 

## 2022-03-16 MED ORDER — HYDROCODONE-ACETAMINOPHEN 5-325 MG PO TABS: 1.0000 | ORAL_TABLET | Freq: Three times a day (TID) | ORAL | 0 refills | Status: DC | PRN

## 2022-03-17 ENCOUNTER — Telehealth: Payer: Self-pay | Admitting: Orthopedic Surgery

## 2022-03-17 NOTE — Telephone Encounter (Signed)
Pending on cover my meds.

## 2022-03-17 NOTE — Telephone Encounter (Signed)
Pt called stating her pain medication needs pre auth at the pharmacy. Please call pt when pre auth has been sent in. Pt phone number is [phone].

## 2022-03-21 NOTE — Telephone Encounter (Signed)
Still pending

## 2022-04-01 ENCOUNTER — Other Ambulatory Visit: Payer: Self-pay | Admitting: Orthopedic Surgery

## 2022-04-01 ENCOUNTER — Ambulatory Visit (INDEPENDENT_AMBULATORY_CARE_PROVIDER_SITE_OTHER): Payer: Medicaid Other | Admitting: Nurse Practitioner

## 2022-04-01 ENCOUNTER — Telehealth: Payer: Self-pay | Admitting: Family

## 2022-04-01 ENCOUNTER — Encounter: Payer: Self-pay | Admitting: Nurse Practitioner

## 2022-04-01 VITALS — BP 112/51 | HR 73 | Temp 98.2°F | Ht 66.0 in | Wt 212.2 lb

## 2022-04-01 DIAGNOSIS — Z0000 Encounter for general adult medical examination without abnormal findings: Secondary | ICD-10-CM

## 2022-04-01 MED ORDER — HYDROCODONE-ACETAMINOPHEN 5-325 MG PO TABS: 1.0000 | ORAL_TABLET | Freq: Three times a day (TID) | ORAL | 0 refills | Status: DC | PRN

## 2022-04-01 NOTE — Progress Notes (Unsigned)
[place], [place]  [address] Phone:  [phone]   Fax:  [phone] Subjective:   Patient ID: Barbara Stanley, female    DOB: 08-23-1971, 51 y.o.   MRN: [id number]  Chief Complaint  Patient presents with   Follow-up    6 month follow up; patient states she bit her tongue the other day and bit it pretty bad and wants it looked at. Patient states that she is still having some back pains and is getting ready to start physical therapy. Patient states the gabapentin helps a little bit as well as the Robaxin.   HPI Barbara Stanley 51 y.o. female  has a past medical history of Abdominal pain, Anemia, Anxiety, Bronchitis, COPD (chronic obstructive pulmonary disease) ([place]), Depression, Diarrhea, Emphysema, GERD (gastroesophageal reflux disease), History of blood transfusion, MDS (myelodysplastic syndrome) ([place]), Splenomegaly, Thrombocytopenia ([place]), and Tobacco abuse. To the [facility] for 6 mths follow up.  States that she continues to have back pain. Pain management completed by orthopedics. Requesting refill of pain medications. Monitors meals and attends physical therapy regularly. Some improvement noted in symptoms when she takes gabapentin and robaxin. Denies any other concerns today.   Denies any fatigue, chest pain, shortness of breath, HA or dizziness. Denies any blurred vision, numbness or tingling.  Past Medical History:  Diagnosis Date   Abdominal pain    Anemia    Anxiety    Bronchitis    COPD (chronic obstructive pulmonary disease) ([facility])    Depression    bipolar   Diarrhea    Emphysema    GERD (gastroesophageal reflux disease)    History of blood transfusion    MDS (myelodysplastic syndrome) ([facility])    Splenomegaly    r/t low platelets   Thrombocytopenia ([facility])    Tobacco abuse     Past Surgical History:  Procedure Laterality Date   ABDOMINAL HYSTERECTOMY     c section x 3      CHOLECYSTECTOMY     GASTRIC BYPASS     UPPER  GASTROINTESTINAL ENDOSCOPY     WISDOM TOOTH EXTRACTION      Family History  Problem Relation Age of Onset   Diabetes Mother    Diabetes Other    Hypertension Other    CAD Other    Heart disease Father    Colon cancer Neg Hx    Rectal cancer Neg Hx    Stomach cancer Neg Hx    Esophageal cancer Neg Hx     Social History   Socioeconomic History   Marital status: Married    Spouse name: Not on file   Number of children: 3   Years of education: Not on file   Highest education level: Not on file  Occupational History   Occupation: disabled  Tobacco Use   Smoking status: Some Days    Packs/day: 0.25    Years: 30.00    Total pack years: 7.50    Types: Cigarettes    Last attempt to quit: 01/31/2021    Years since quitting: 1.1   Smokeless tobacco: Never   Tobacco comments:    5 cigs a day  Vaping Use   Vaping Use: Never used  Substance and Sexual Activity   Alcohol use: Not Currently    Comment: occasional wine   Drug use: No   Sexual activity: Yes    Birth control/protection: None    Comment: Hysterectomy  Other Topics Concern   Not on file  Social History Narrative   Not on file   Social Determinants of Health   Financial Resource Strain: Not on file  Food Insecurity: Not on file  Transportation Needs: Not on file  Physical Activity: Not on file  Stress: Not on file  Social Connections: Not on file  Intimate Partner Violence: Not on file    Outpatient Medications Prior to Visit  Medication Sig Dispense Refill   acetaminophen (TYLENOL) 500 MG tablet Take 1,000 mg by mouth daily as needed for moderate pain.     albuterol (VENTOLIN HFA) 108 (90 Base) MCG/ACT inhaler Inhale 1 - 2 puffs into the lungs every 6  hours as needed for wheezing or shortness of breath. 18 g 0   busPIRone (BUSPAR) 5 MG tablet TAKE 1 TABLET(5 MG) BY MOUTH THREE TIMES DAILY (Patient taking differently: Take 5 mg by mouth 3 (three) times daily.) 90 tablet 0   cholecalciferol (VITAMIN D3)  25 MCG (1000 UT) tablet Take 5,000 Units by mouth daily.     colestipol (COLESTID) 1 g tablet Take 2 tablets (2 g total) by mouth 2 (two) times daily. 120 tablet 0   Copper Gluconate 2 MG CAPS Take 4 mg by mouth daily.     diclofenac sodium (VOLTAREN) 1 % GEL Apply 2 g topically 4 (four) times daily. (Patient taking differently: Apply 2 g topically daily as needed (for pain).) 2 g 3   dicyclomine (BENTYL) 20 MG tablet Take 1 tablet (20 mg total) by mouth in the morning, at noon, in the evening, and at bedtime. (Patient taking differently: Take 20 mg by mouth 4 (four) times daily as needed for spasms.) 120 tablet 3   eltrombopag (PROMACTA) 50 MG tablet Take 50 mg by mouth daily.     esomeprazole (NEXIUM) 40 MG capsule Take 1 capsule (40 mg total) by mouth 2 (two) times daily. 60 capsule 0   famotidine (PEPCID) 20 MG tablet Take 1 tablet (20 mg total) by mouth 2 (two) times daily. 180 tablet 3   fluticasone-salmeterol (ADVAIR HFA) 230-21 MCG/ACT inhaler Inhale 2 puffs into the lungs 2 (two) times daily. 12 g 5   folic acid (FOLVITE) 1 MG tablet TAKE 1 TABLET(1 MG) BY MOUTH DAILY (Patient taking differently: Take 1 mg by mouth daily.) 90 tablet 2   gabapentin (NEURONTIN) 300 MG capsule TAKE 1 CAPSULE(300 MG) BY MOUTH TWICE DAILY 90 capsule 0   ipratropium-albuterol (DUONEB) 0.5-2.5 (3) MG/3ML SOLN Take 3 mLs by nebulization every 6 hours as needed. 360 mL 0   methocarbamol (ROBAXIN) 500 MG tablet TAKE 1 TABLET(500 MG) BY MOUTH EVERY 6 HOURS AS NEEDED FOR MUSCLE SPASMS OR BACK PAIN 60 tablet 0   promethazine (PHENERGAN) 25 MG tablet Take 1 tablet (25 mg total) by mouth every 6 (six) hours as needed for refractory nausea / vomiting. 30 tablet 0   Tiotropium Bromide Monohydrate (SPIRIVA RESPIMAT) 2.5 MCG/ACT AERS Inhale 2 puffs into the lungs daily. 4 g 5   traZODone (DESYREL) 150 MG tablet Take 1 tablet (150 mg total) by mouth at bedtime. For sleep (Patient taking differently: Take 400 mg by mouth at  bedtime. For sleep) 30 tablet 0   venlafaxine XR (EFFEXOR-XR) 75 MG 24 hr capsule Take 1 capsule (75 mg total) by mouth daily with breakfast. For depression (Patient taking differently: Take 225 mg by mouth daily with breakfast. For depression) 30 capsule 0   HYDROcodone-acetaminophen (NORCO) 5-325 MG tablet Take 1 tablet by mouth 3 (three) times daily as  needed. 30 tablet 0   Accu-Chek Softclix Lancets lancets USE AS DIRECTED USE TWO TIMES A DAY 100 each 12   Lidocaine (HM LIDOCAINE PATCH) 4 % PTCH Apply 1 patch topically daily. 7 patch 1   nicotine polacrilex (COMMIT) 4 MG lozenge Take 1 lozenge (4 mg total) by mouth as needed for smoking cessation. 108 tablet 3   No facility-administered medications prior to visit.    Allergies  Allergen Reactions   Other     "Banana Bag" given to patient at physician's office, severe back pain, SOB   Nicotine Rash    Nicotine patch    Review of Systems  Constitutional:  Negative for chills, fever and malaise/fatigue.  Respiratory:  Negative for cough and shortness of breath.   Cardiovascular:  Negative for chest pain, palpitations and leg swelling.  Gastrointestinal:  Negative for abdominal pain, blood in stool, constipation, diarrhea, nausea and vomiting.  Genitourinary: Negative.   Musculoskeletal:  Positive for back pain.  Skin: Negative.   Neurological: Negative.   Psychiatric/Behavioral:  Negative for depression. The patient is not nervous/anxious.   All other systems reviewed and are negative.      Objective:    Physical Exam Vitals reviewed.  Constitutional:      General: She is not in acute distress.    Appearance: Normal appearance. She is obese.  HENT:     Head: Normocephalic.  Neck:     Vascular: No carotid bruit.  Cardiovascular:     Rate and Rhythm: Normal rate and regular rhythm.     Pulses: Normal pulses.     Heart sounds: Normal heart sounds.     Comments: No obvious peripheral edema Pulmonary:     Effort:  Pulmonary effort is normal.     Breath sounds: Normal breath sounds.  Musculoskeletal:        General: No swelling, tenderness, deformity or signs of injury. Normal range of motion.     Cervical back: Normal range of motion and neck supple. No rigidity or tenderness.     Right lower leg: No edema.     Left lower leg: No edema.  Lymphadenopathy:     Cervical: No cervical adenopathy.  Skin:    General: Skin is warm and dry.     Capillary Refill: Capillary refill takes less than 2 seconds.  Neurological:     General: No focal deficit present.     Mental Status: She is alert and oriented to person, place, and time.  Psychiatric:        Mood and Affect: Mood normal.        Behavior: Behavior normal.        Thought Content: Thought content normal.        Judgment: Judgment normal.     BP (!) 112/51   Pulse 73   Temp 98.2 F (36.8 C)   Ht _0  (1.676 m)   Wt 212 lb 3.2 oz (96.3 kg)   LMP  (LMP Unknown)   SpO2 98%   BMI 34.25 kg/m  Wt Readings from Last 3 Encounters:  04/01/22 212 lb 3.2 oz (96.3 kg)  03/02/22 220 lb (99.8 kg)  01/28/22 220 lb 0.3 oz (99.8 kg)    Immunization History  Administered Date(s) Administered   Influenza,inj,Quad PF,6+ Mos 08/07/2017, 07/27/2018, 07/04/2019   Influenza,inj,Quad PF,6-35 Mos 08/10/2020   PFIZER(Purple Top)SARS-COV-2 Vaccination 01/30/2020, 02/21/2020   Pneumococcal Conjugate-13 07/27/2018   Pneumococcal Polysaccharide-23 03/22/2014, 08/07/2017, 04/01/2021   Tdap 08/07/2017  Diabetic Foot Exam - Simple   No data filed     Lab Results  Component Value Date   TSH 0.74 09/05/2018   Lab Results  Component Value Date   WBC 2.4 (LL) 04/01/2022   HGB 8.9 (L) 04/01/2022   HCT 29.4 (L) 04/01/2022   MCV 95 04/01/2022   PLT 77 (LL) 04/01/2022   Lab Results  Component Value Date   NA 141 04/01/2022   K 4.3 04/01/2022   CO2 27 04/01/2022   GLUCOSE 83 04/01/2022   BUN 8 04/01/2022   CREATININE 0.57 04/01/2022   BILITOT 0.4  04/01/2022   ALKPHOS 116 04/01/2022   AST 17 04/01/2022   ALT 16 04/01/2022   PROT 5.7 (L) 04/01/2022   ALBUMIN 4.0 04/01/2022   CALCIUM 8.7 04/01/2022   ANIONGAP 6 12/19/2021   EGFR 110 04/01/2022   GFR 93.62 12/25/2019   Lab Results  Component Value Date   CHOL 95 (L) 04/01/2022   CHOL 133 12/30/2021   CHOL 128 12/31/2019   Lab Results  Component Value Date   HDL 42 04/01/2022   HDL 49 12/30/2021   HDL 52 12/31/2019   Lab Results  Component Value Date   LDLCALC 41 04/01/2022   LDLCALC 67 12/30/2021   LDLCALC 63 12/31/2019   Lab Results  Component Value Date   TRIG 49 04/01/2022   TRIG 90 12/30/2021   TRIG 61 12/31/2019   Lab Results  Component Value Date   CHOLHDL 2.3 04/01/2022   CHOLHDL 2.7 12/30/2021   CHOLHDL 2.5 12/31/2019   Lab Results  Component Value Date   HGBA1C 5.0 07/09/2020   HGBA1C 5.9 (A) 04/01/2020   HGBA1C 5.9 04/01/2020   HGBA1C 5.9 04/01/2020   HGBA1C 5.9 04/01/2020       Assessment & Plan:   Problem List Items Addressed This Visit   None Visit Diagnoses     Encounter for wellness examination in adult    -  Primary   Relevant Orders   CBC with Differential/Platelet (Completed)   Comprehensive metabolic panel (Completed)   Lipid panel (Completed) Encouraged continued diet and exercise efforts  Encouraged continued compliance with medication  Encouraged to maintain current treatment plan for management of back pain    Follow up in 6 mths for wellness visit, sooner as needed     I have discontinued Jenniefer Brosch's nicotine polacrilex, Accu-Chek Softclix Lancets, and lidocaine. I am also having her maintain her traZODone, venlafaxine XR, acetaminophen, Copper Gluconate, cholecalciferol, diclofenac sodium, folic acid, eltrombopag, busPIRone, famotidine, dicyclomine, promethazine, ipratropium-albuterol, albuterol, Spiriva Respimat, Advair HFA, colestipol, esomeprazole, methocarbamol, and gabapentin.  No orders of the defined types  were placed in this encounter.    Teena Dunk, NP

## 2022-04-01 NOTE — Telephone Encounter (Signed)
Thi pt saw Junie Panning and was referred to Dr. Lorin Mercy  for LBP who then referred to Dr. Hal Morales. Last refill of pain medication was 03/16/2022 by Junie Panning. Pt requesting refill on hydrocodone. Please advise.

## 2022-04-01 NOTE — Patient Instructions (Signed)
You were seen today in the [facility] for 6 mths follow . Labs were collected, results will be available via MyChart or, if abnormal, you will be contacted by clinic staff. You were prescribed medications, please take as directed. Please follow up in 6 mths for wellness visit

## 2022-04-01 NOTE — Telephone Encounter (Signed)
Patient called needing Rx refilled Hydrocodone. Patient said she is completely out of her medication. The number to contact patient is [phone]

## 2022-04-02 LAB — CBC WITH DIFFERENTIAL/PLATELET
Basophils Absolute: 0 10*3/uL (ref 0.0–0.2)
Basos: 1 %
EOS (ABSOLUTE): 0 10*3/uL (ref 0.0–0.4)
Eos: 1 %
Hematocrit: 29.4 % — ABNORMAL LOW (ref 34.0–46.6)
Hemoglobin: 8.9 g/dL — ABNORMAL LOW (ref 11.1–15.9)
Immature Grans (Abs): 0 10*3/uL (ref 0.0–0.1)
Immature Granulocytes: 1 %
Lymphocytes Absolute: 0.4 10*3/uL — ABNORMAL LOW (ref 0.7–3.1)
Lymphs: 18 %
MCH: 28.7 pg (ref 26.6–33.0)
MCHC: 30.3 g/dL — ABNORMAL LOW (ref 31.5–35.7)
MCV: 95 fL (ref 79–97)
Monocytes Absolute: 0.3 10*3/uL (ref 0.1–0.9)
Monocytes: 14 %
Neutrophils Absolute: 1.6 10*3/uL (ref 1.4–7.0)
Neutrophils: 65 %
Platelets: 77 10*3/uL — CL (ref 150–450)
RBC: 3.1 x10E6/uL — ABNORMAL LOW (ref 3.77–5.28)
RDW: 14.2 % (ref 11.7–15.4)
WBC: 2.4 10*3/uL — CL (ref 3.4–10.8)

## 2022-04-02 LAB — COMPREHENSIVE METABOLIC PANEL
ALT: 16 IU/L (ref 0–32)
AST: 17 IU/L (ref 0–40)
Albumin/Globulin Ratio: 2.4 — ABNORMAL HIGH (ref 1.2–2.2)
Albumin: 4 g/dL (ref 3.8–4.9)
Alkaline Phosphatase: 116 IU/L (ref 44–121)
BUN/Creatinine Ratio: 14 (ref 9–23)
BUN: 8 mg/dL (ref 6–24)
Bilirubin Total: 0.4 mg/dL (ref 0.0–1.2)
CO2: 27 mmol/L (ref 20–29)
Calcium: 8.7 mg/dL (ref 8.7–10.2)
Chloride: 103 mmol/L (ref 96–106)
Creatinine, Ser: 0.57 mg/dL (ref 0.57–1.00)
Globulin, Total: 1.7 g/dL (ref 1.5–4.5)
Glucose: 83 mg/dL (ref 70–99)
Potassium: 4.3 mmol/L (ref 3.5–5.2)
Sodium: 141 mmol/L (ref 134–144)
Total Protein: 5.7 g/dL — ABNORMAL LOW (ref 6.0–8.5)
eGFR: 110 mL/min/{1.73_m2} (ref >59)

## 2022-04-02 LAB — LIPID PANEL
Chol/HDL Ratio: 2.3 ratio (ref 0.0–4.4)
Cholesterol, Total: 95 mg/dL — ABNORMAL LOW (ref 100–199)
HDL: 42 mg/dL (ref >39)
LDL Chol Calc (NIH): 41 mg/dL (ref 0–99)
Triglycerides: 49 mg/dL (ref 0–149)
VLDL Cholesterol Cal: 12 mg/dL (ref 5–40)

## 2022-04-08 ENCOUNTER — Telehealth: Payer: Self-pay | Admitting: *Deleted

## 2022-04-08 ENCOUNTER — Encounter: Payer: Medicaid Other | Attending: Physical Medicine & Rehabilitation | Admitting: Physical Medicine & Rehabilitation

## 2022-04-08 ENCOUNTER — Encounter: Payer: Self-pay | Admitting: Physical Medicine & Rehabilitation

## 2022-04-08 VITALS — BP 105/70 | HR 96 | Ht 66.0 in | Wt 213.0 lb

## 2022-04-08 DIAGNOSIS — M47816 Spondylosis without myelopathy or radiculopathy, lumbar region: Secondary | ICD-10-CM | POA: Diagnosis present

## 2022-04-08 MED ORDER — DIAZEPAM 10 MG PO TABS: ORAL_TABLET | ORAL | 0 refills | Status: DC

## 2022-04-08 NOTE — Telephone Encounter (Signed)
Reviewed Pre procedure form. She would like a pre med so Valium 10 mg called to pharmacy per protocol to be taken 30-60 minutes prior to bilateral MBB scheduled in August. She is aware she will have to have a driver for this procedure due to pre med.

## 2022-04-11 ENCOUNTER — Ambulatory Visit: Payer: Medicaid Other | Admitting: Pulmonary Disease

## 2022-04-11 NOTE — Progress Notes (Deleted)
Synopsis: Referred in Aug 2020 for COPD by [name], NP  Subjective:   PATIENT ID: Barbara Stanley GENDER: female DOB: [birth year], MRN: [id number]  No chief complaint on file.   51 year old female past medical history of COPD depression emphysema gastroesophageal reflux disease tobacco abuse. 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 Patient was referred to pulmonary at her last primary care visit.  She was also advised to quit smoking.  Longtime smoker.  No prior pulmonary function tests. She was told in the past that she has chronic bronchitis. She used Symbicort and Spiriva HandiHaler. Smoked since age 66, continues to smoke 1 ppd. She has always had difficult quitting. She has used Wellbutrin in the past. She has mental health issues and feels uncomfortable with using chantix.  She still has significant respiratory symptoms with exertion.  She also notices heat and humidity makes her symptoms worse.  She does have nocturnal wheezing and shortness of breath.  OV 07/03/2019: Patient here for follow-up regarding recent initiation of Trelegy.  Had pulmonary function test completed at the beginning of the week with an FEV1 of 67%.  New diagnosis of stage II COPD.  CT imaging with evidence of emphysema as well.  Overall since starting new inhaler she is breathing somewhat better.  She has tapered herself down from a full pack per day of smoking to two thirds of a pack of cigarettes per day.  She is trying to quit however is exposed to ongoing secondhand smoke from her family as well.  Patient has daily cough and occasional wheezing.  Denies hemoptysis, fevers night sweats weight loss.   OV 04/11/2022: Here today for follow-up.  Last seen by me in 2020. Over the last 2 years has been followed by APPs here in clinic last seen in March 2023 by Derl Barrow, NP.  Presented for hospital follow-up after being admitted in March for COPD exacerbation.  At that time was doing better on 2 L nasal cannula.  Currently maintained  on Advair plus Spiriva. ***    Past Medical History:  Diagnosis Date   Abdominal pain    Anemia    Anxiety    Bronchitis    COPD (chronic obstructive pulmonary disease) (HCC)    Depression    bipolar   Diarrhea    Emphysema    GERD (gastroesophageal reflux disease)    History of blood transfusion    MDS (myelodysplastic syndrome) (HCC)    Splenomegaly    r/t low platelets   Thrombocytopenia (HCC)    Tobacco abuse      Family History  Problem Relation Age of Onset   Diabetes Mother    Heart disease Father    Diabetes Other    Hypertension Other    CAD Other    Colon cancer Neg Hx    Rectal cancer Neg Hx    Stomach cancer Neg Hx    Esophageal cancer Neg Hx      Past Surgical History:  Procedure Laterality Date   ABDOMINAL HYSTERECTOMY     c section x 3      CHOLECYSTECTOMY     GASTRIC BYPASS     UPPER GASTROINTESTINAL ENDOSCOPY     WISDOM TOOTH EXTRACTION      Social History   Socioeconomic History   Marital status: Married    Spouse name: Not on file   Number of children: 3   Years of education: Not on file   Highest education level: Not on file  Occupational History  Occupation: disabled  Tobacco Use   Smoking status: Some Days    Packs/day: 0.25    Years: 30.00    Total pack years: 7.50    Types: Cigarettes    Last attempt to quit: 01/31/2021    Years since quitting: 1.1   Smokeless tobacco: Never   Tobacco comments:    5 cigs a day  Vaping Use   Vaping Use: Never used  Substance and Sexual Activity   Alcohol use: Not Currently    Comment: occasional wine   Drug use: No   Sexual activity: Yes    Birth control/protection: None    Comment: Hysterectomy  Other Topics Concern   Not on file  Social History Narrative   Not on file   Social Determinants of Health   Financial Resource Strain: Not on file  Food Insecurity: Not on file  Transportation Needs: Not on file  Physical Activity: Not on file  Stress: Not on file  Social  Connections: Not on file  Intimate Partner Violence: Not on file     Allergies  Allergen Reactions   Other     "Banana Bag" given to patient at physician's office, severe back pain, SOB   Nicotine Rash    Nicotine patch     Outpatient Medications Prior to Visit  Medication Sig Dispense Refill   acetaminophen (TYLENOL) 500 MG tablet Take 1,000 mg by mouth daily as needed for moderate pain.     albuterol (VENTOLIN HFA) 108 (90 Base) MCG/ACT inhaler Inhale 1 - 2 puffs into the lungs every 6  hours as needed for wheezing or shortness of breath. 18 g 0   busPIRone (BUSPAR) 5 MG tablet TAKE 1 TABLET(5 MG) BY MOUTH THREE TIMES DAILY (Patient taking differently: Take 5 mg by mouth 3 (three) times daily.) 90 tablet 0   cholecalciferol (VITAMIN D3) 25 MCG (1000 UT) tablet Take 5,000 Units by mouth daily.     colestipol (COLESTID) 1 g tablet Take 2 tablets (2 g total) by mouth 2 (two) times daily. 120 tablet 0   Copper Gluconate 2 MG CAPS Take 4 mg by mouth daily.     diazepam (VALIUM) 10 MG tablet Take one tablet po 30 minutes prior to procedure. Must have a driver. 1 tablet 0   diclofenac sodium (VOLTAREN) 1 % GEL Apply 2 g topically 4 (four) times daily. (Patient taking differently: Apply 2 g topically daily as needed (for pain).) 2 g 3   dicyclomine (BENTYL) 20 MG tablet Take 1 tablet (20 mg total) by mouth in the morning, at noon, in the evening, and at bedtime. (Patient taking differently: Take 20 mg by mouth 4 (four) times daily as needed for spasms.) 120 tablet 3   eltrombopag (PROMACTA) 50 MG tablet Take 50 mg by mouth daily.     esomeprazole (NEXIUM) 40 MG capsule Take 1 capsule (40 mg total) by mouth 2 (two) times daily. 60 capsule 0   famotidine (PEPCID) 20 MG tablet Take 1 tablet (20 mg total) by mouth 2 (two) times daily. 180 tablet 3   fluticasone-salmeterol (ADVAIR HFA) 230-21 MCG/ACT inhaler Inhale 2 puffs into the lungs 2 (two) times daily. 12 g 5   folic acid (FOLVITE) 1 MG tablet  TAKE 1 TABLET(1 MG) BY MOUTH DAILY (Patient taking differently: Take 1 mg by mouth daily.) 90 tablet 2   gabapentin (NEURONTIN) 300 MG capsule TAKE 1 CAPSULE(300 MG) BY MOUTH TWICE DAILY 90 capsule 0   HYDROcodone-acetaminophen (NORCO) 5-325 MG  tablet Take 1 tablet by mouth 3 (three) times daily as needed. 30 tablet 0   ipratropium-albuterol (DUONEB) 0.5-2.5 (3) MG/3ML SOLN Take 3 mLs by nebulization every 6 hours as needed. 360 mL 0   methocarbamol (ROBAXIN) 500 MG tablet TAKE 1 TABLET(500 MG) BY MOUTH EVERY 6 HOURS AS NEEDED FOR MUSCLE SPASMS OR BACK PAIN 60 tablet 0   promethazine (PHENERGAN) 25 MG tablet Take 1 tablet (25 mg total) by mouth every 6 (six) hours as needed for refractory nausea / vomiting. 30 tablet 0   Tiotropium Bromide Monohydrate (SPIRIVA RESPIMAT) 2.5 MCG/ACT AERS Inhale 2 puffs into the lungs daily. 4 g 5   traZODone (DESYREL) 150 MG tablet Take 1 tablet (150 mg total) by mouth at bedtime. For sleep (Patient taking differently: Take 400 mg by mouth at bedtime. For sleep) 30 tablet 0   venlafaxine XR (EFFEXOR-XR) 75 MG 24 hr capsule Take 1 capsule (75 mg total) by mouth daily with breakfast. For depression (Patient taking differently: Take 225 mg by mouth daily with breakfast. For depression) 30 capsule 0   No facility-administered medications prior to visit.    Review of Systems  Constitutional:  Negative for chills, fever, malaise/fatigue and weight loss.  HENT:  Negative for hearing loss, sore throat and tinnitus.   Eyes:  Negative for blurred vision and double vision.  Respiratory:  Positive for cough, sputum production and shortness of breath. Negative for hemoptysis, wheezing and stridor.   Cardiovascular:  Negative for chest pain, palpitations, orthopnea, leg swelling and PND.  Gastrointestinal:  Negative for abdominal pain, constipation, diarrhea, heartburn, nausea and vomiting.  Genitourinary:  Negative for dysuria, hematuria and urgency.  Musculoskeletal:   Negative for joint pain and myalgias.  Skin:  Negative for itching and rash.  Neurological:  Negative for dizziness, tingling, weakness and headaches.  Endo/Heme/Allergies:  Negative for environmental allergies. Does not bruise/bleed easily.  Psychiatric/Behavioral:  Negative for depression. The patient is not nervous/anxious and does not have insomnia.   All other systems reviewed and are negative.   Objective:  Physical Exam Vitals reviewed.  Constitutional:      General: She is not in acute distress.    Appearance: She is well-developed.  HENT:     Head: Normocephalic and atraumatic.     Mouth/Throat:     Pharynx: No oropharyngeal exudate.  Eyes:     Conjunctiva/sclera: Conjunctivae normal.     Pupils: Pupils are equal, round, and reactive to light.  Neck:     Vascular: No JVD.     Trachea: No tracheal deviation.  Cardiovascular:     Rate and Rhythm: Normal rate and regular rhythm.     Heart sounds: S1 normal and S2 normal.     Comments: Distant heart tones Pulmonary:     Effort: No tachypnea or accessory muscle usage.     Breath sounds: No stridor. Decreased breath sounds (throughout all lung fields) present. No wheezing, rhonchi or rales.  Abdominal:     General: Bowel sounds are normal. There is no distension.     Palpations: Abdomen is soft.     Tenderness: There is no abdominal tenderness.  Musculoskeletal:        General: Deformity (muscle wasting ) present.  Skin:    General: Skin is warm and dry.     Capillary Refill: Capillary refill takes less than 2 seconds.     Findings: No rash.  Neurological:     Mental Status: She is alert and oriented to  person, place, and time.  Psychiatric:        Behavior: Behavior normal.      There were no vitals filed for this visit.    on RA BMI Readings from Last 3 Encounters:  04/08/22 34.38 kg/m  04/01/22 34.25 kg/m  03/02/22 35.51 kg/m   Wt Readings from Last 3 Encounters:  04/08/22 213 lb (96.6 kg)  04/01/22  212 lb 3.2 oz (96.3 kg)  03/02/22 220 lb (99.8 kg)     CBC    Component Value Date/Time   WBC 2.4 (LL) 04/01/2022 1046   WBC 3.1 (L) 12/19/2021 0257   RBC 3.10 (L) 04/01/2022 1046   RBC 3.17 (L) 12/19/2021 0257   HGB 8.9 (L) 04/01/2022 1046   HCT 29.4 (L) 04/01/2022 1046   PLT 77 (LL) 04/01/2022 1046   MCV 95 04/01/2022 1046   MCH 28.7 04/01/2022 1046   MCH 30.0 12/19/2021 0257   MCHC 30.3 (L) 04/01/2022 1046   MCHC 31.1 12/19/2021 0257   RDW 14.2 04/01/2022 1046   LYMPHSABS 0.4 (L) 04/01/2022 1046   MONOABS 0.3 12/19/2021 0257   EOSABS 0.0 04/01/2022 1046   BASOSABS 0.0 04/01/2022 1046     Chest Imaging: 08/07/2017 chest x-ray: Bronchiectatic changes. The patient's images have been independently reviewed by me.    Pulmonary Functions Testing Results:    Latest Ref Rng & Units 07/01/2019    3:51 PM  PFT Results  FVC-Pre L 3.36   FVC-Predicted Pre % 82   FVC-Post L 3.62   FVC-Predicted Post % 88   Pre FEV1/FVC % % 58   Post FEV1/FCV % % 60   FEV1-Pre L 1.95   FEV1-Predicted Pre % 60   FEV1-Post L 2.18   DLCO uncorrected ml/min/mmHg 22.11   DLCO UNC% % 91   DLCO corrected ml/min/mmHg 21.97   DLCO COR %Predicted % 90   DLVA Predicted % 83   TLC L 7.65   TLC % Predicted % 135   RV % Predicted % 212     FeNO: None   Pathology: None   Echocardiogram: None   Heart Catheterization: None     Assessment & Plan:   No diagnosis found.   Discussion:  51 year old female, significant smoking history, greater than 40+-pack-year history.  At her highest was smoking 2 packs/day.  Now down to two thirds of a pack per day.  Diagnosed with a stage II COPD on pulmonary function test this past week.  Continue use of Trelegy inhaler.  She is doing well with this. Smoking cessation counseling was completed again today. At the age of 89 she will likely be able to enroll into lung cancer screening program.  Continue use of albuterol as needed for shortness of breath  and wheezing. Today regarding smoking cessation we discussed tapering method. Not a candidate for Chantix due to behavioral health issues in the past.  And the patient has declined. Patient can use nicotine supplements.  This was discussed again today as well.  Patient to return to clinic in 6 months.  Greater than 50% of this patient's 25-minute of visit was spent face-to-face discussing the above recommendations and treatment plan.  We reviewed and interpreted the patient's pulmonary function test today in the office.     Current Outpatient Medications:    acetaminophen (TYLENOL) 500 MG tablet, Take 1,000 mg by mouth daily as needed for moderate pain., Disp: , Rfl:    albuterol (VENTOLIN HFA) 108 (90 Base) MCG/ACT inhaler, Inhale  1 - 2 puffs into the lungs every 6  hours as needed for wheezing or shortness of breath., Disp: 18 g, Rfl: 0   busPIRone (BUSPAR) 5 MG tablet, TAKE 1 TABLET(5 MG) BY MOUTH THREE TIMES DAILY (Patient taking differently: Take 5 mg by mouth 3 (three) times daily.), Disp: 90 tablet, Rfl: 0   cholecalciferol (VITAMIN D3) 25 MCG (1000 UT) tablet, Take 5,000 Units by mouth daily., Disp: , Rfl:    colestipol (COLESTID) 1 g tablet, Take 2 tablets (2 g total) by mouth 2 (two) times daily., Disp: 120 tablet, Rfl: 0   Copper Gluconate 2 MG CAPS, Take 4 mg by mouth daily., Disp: , Rfl:    diazepam (VALIUM) 10 MG tablet, Take one tablet po 30 minutes prior to procedure. Must have a driver., Disp: 1 tablet, Rfl: 0   diclofenac sodium (VOLTAREN) 1 % GEL, Apply 2 g topically 4 (four) times daily. (Patient taking differently: Apply 2 g topically daily as needed (for pain).), Disp: 2 g, Rfl: 3   dicyclomine (BENTYL) 20 MG tablet, Take 1 tablet (20 mg total) by mouth in the morning, at noon, in the evening, and at bedtime. (Patient taking differently: Take 20 mg by mouth 4 (four) times daily as needed for spasms.), Disp: 120 tablet, Rfl: 3   eltrombopag (PROMACTA) 50 MG tablet, Take 50  mg by mouth daily., Disp: , Rfl:    esomeprazole (NEXIUM) 40 MG capsule, Take 1 capsule (40 mg total) by mouth 2 (two) times daily., Disp: 60 capsule, Rfl: 0   famotidine (PEPCID) 20 MG tablet, Take 1 tablet (20 mg total) by mouth 2 (two) times daily., Disp: 180 tablet, Rfl: 3   fluticasone-salmeterol (ADVAIR HFA) 230-21 MCG/ACT inhaler, Inhale 2 puffs into the lungs 2 (two) times daily., Disp: 12 g, Rfl: 5   folic acid (FOLVITE) 1 MG tablet, TAKE 1 TABLET(1 MG) BY MOUTH DAILY (Patient taking differently: Take 1 mg by mouth daily.), Disp: 90 tablet, Rfl: 2   gabapentin (NEURONTIN) 300 MG capsule, TAKE 1 CAPSULE(300 MG) BY MOUTH TWICE DAILY, Disp: 90 capsule, Rfl: 0   HYDROcodone-acetaminophen (NORCO) 5-325 MG tablet, Take 1 tablet by mouth 3 (three) times daily as needed., Disp: 30 tablet, Rfl: 0   ipratropium-albuterol (DUONEB) 0.5-2.5 (3) MG/3ML SOLN, Take 3 mLs by nebulization every 6 hours as needed., Disp: 360 mL, Rfl: 0   methocarbamol (ROBAXIN) 500 MG tablet, TAKE 1 TABLET(500 MG) BY MOUTH EVERY 6 HOURS AS NEEDED FOR MUSCLE SPASMS OR BACK PAIN, Disp: 60 tablet, Rfl: 0   promethazine (PHENERGAN) 25 MG tablet, Take 1 tablet (25 mg total) by mouth every 6 (six) hours as needed for refractory nausea / vomiting., Disp: 30 tablet, Rfl: 0   Tiotropium Bromide Monohydrate (SPIRIVA RESPIMAT) 2.5 MCG/ACT AERS, Inhale 2 puffs into the lungs daily., Disp: 4 g, Rfl: 5   traZODone (DESYREL) 150 MG tablet, Take 1 tablet (150 mg total) by mouth at bedtime. For sleep (Patient taking differently: Take 400 mg by mouth at bedtime. For sleep), Disp: 30 tablet, Rfl: 0   venlafaxine XR (EFFEXOR-XR) 75 MG 24 hr capsule, Take 1 capsule (75 mg total) by mouth daily with breakfast. For depression (Patient taking differently: Take 225 mg by mouth daily with breakfast. For depression), Disp: 30 capsule, Rfl: 0   Garner Nash, DO [facility] 04/11/2022 8:35 AM

## 2022-04-13 ENCOUNTER — Encounter: Payer: Self-pay | Admitting: Physical Medicine & Rehabilitation

## 2022-04-13 ENCOUNTER — Other Ambulatory Visit: Payer: Self-pay | Admitting: Family

## 2022-04-13 DIAGNOSIS — G8929 Other chronic pain: Secondary | ICD-10-CM

## 2022-04-13 MED ORDER — METHOCARBAMOL 500 MG PO TABS: 500.0000 mg | ORAL_TABLET | Freq: Three times a day (TID) | ORAL | 0 refills | Status: DC

## 2022-04-13 MED ORDER — HYDROCODONE-ACETAMINOPHEN 5-325 MG PO TABS: 1.0000 | ORAL_TABLET | Freq: Two times a day (BID) | ORAL | 0 refills | Status: DC | PRN

## 2022-04-20 MED ORDER — HYDROCODONE-ACETAMINOPHEN 5-325 MG PO TABS
1.0000 | ORAL_TABLET | Freq: Three times a day (TID) | ORAL | 0 refills | Status: DC | PRN
Start: 2022-04-20 — End: 2022-05-10

## 2022-04-27 ENCOUNTER — Other Ambulatory Visit: Payer: Self-pay | Admitting: Nurse Practitioner

## 2022-04-27 DIAGNOSIS — G629 Polyneuropathy, unspecified: Secondary | ICD-10-CM

## 2022-04-27 DIAGNOSIS — G8929 Other chronic pain: Secondary | ICD-10-CM

## 2022-05-10 MED ORDER — HYDROCODONE-ACETAMINOPHEN 5-325 MG PO TABS: 1.0000 | ORAL_TABLET | Freq: Two times a day (BID) | ORAL | 0 refills | Status: DC | PRN

## 2022-05-19 ENCOUNTER — Other Ambulatory Visit: Payer: Self-pay | Admitting: Family

## 2022-05-19 ENCOUNTER — Other Ambulatory Visit: Payer: Self-pay | Admitting: Gastroenterology

## 2022-05-19 DIAGNOSIS — G8929 Other chronic pain: Secondary | ICD-10-CM

## 2022-05-25 ENCOUNTER — Other Ambulatory Visit: Payer: Self-pay | Admitting: Family

## 2022-05-25 MED ORDER — HYDROCODONE-ACETAMINOPHEN 5-325 MG PO TABS: 1.0000 | ORAL_TABLET | Freq: Two times a day (BID) | ORAL | 0 refills | Status: DC | PRN

## 2022-05-26 ENCOUNTER — Ambulatory Visit: Payer: Medicaid Other | Admitting: Physical Medicine & Rehabilitation

## 2022-06-03 ENCOUNTER — Other Ambulatory Visit: Payer: Self-pay | Admitting: Family

## 2022-06-03 DIAGNOSIS — G8929 Other chronic pain: Secondary | ICD-10-CM

## 2022-06-14 ENCOUNTER — Other Ambulatory Visit: Payer: Self-pay | Admitting: Gastroenterology

## 2022-06-15 ENCOUNTER — Other Ambulatory Visit: Payer: Self-pay

## 2022-06-15 ENCOUNTER — Ambulatory Visit: Payer: Medicaid Other | Admitting: Gastroenterology

## 2022-06-15 ENCOUNTER — Telehealth: Payer: Self-pay | Admitting: Pharmacy Technician

## 2022-06-15 DIAGNOSIS — M810 Age-related osteoporosis without current pathological fracture: Secondary | ICD-10-CM

## 2022-06-15 NOTE — Telephone Encounter (Signed)
Auth Submission: NO AUTH NEEDED Payer: [place] MEDICAID Medication & CPT/J Code(s) submitted: Reclast (Zolendronic acid) [id number] Route of submission (phone, fax, portal): PHONE [phone] Phone # Fax # Auth type: Buy/Bill Units/visits requested: X1 DOSE Reference number: [id number] Approval from: 06/16/22 to 10/16/22 a

## 2022-06-16 ENCOUNTER — Telehealth: Payer: Self-pay | Admitting: Gastroenterology

## 2022-06-16 MED ORDER — ESOMEPRAZOLE MAGNESIUM 40 MG PO CPDR: 40.0000 mg | DELAYED_RELEASE_CAPSULE | Freq: Two times a day (BID) | ORAL | 1 refills | Status: DC

## 2022-06-16 NOTE — Telephone Encounter (Signed)
error 

## 2022-06-16 NOTE — Telephone Encounter (Signed)
Patient called states her Nexium 40 mg was denied. She states she can not go without it. Requesting a call back as soon as possible. Please call to advise.

## 2022-06-16 NOTE — Telephone Encounter (Signed)
Refilled Nexium one last time. Patient must keep office visit for any additional refills.

## 2022-06-17 MED ORDER — HYDROCODONE-ACETAMINOPHEN 5-325 MG PO TABS: 1.0000 | ORAL_TABLET | Freq: Two times a day (BID) | ORAL | 0 refills | Status: DC | PRN

## 2022-06-22 ENCOUNTER — Ambulatory Visit (INDEPENDENT_AMBULATORY_CARE_PROVIDER_SITE_OTHER): Payer: Medicaid Other

## 2022-06-22 VITALS — BP 115/48 | HR 75 | Temp 98.9°F | Resp 18 | Ht 66.0 in | Wt 198.6 lb

## 2022-06-22 DIAGNOSIS — M810 Age-related osteoporosis without current pathological fracture: Secondary | ICD-10-CM

## 2022-06-22 MED ORDER — ZOLEDRONIC ACID 5 MG/100ML IV SOLN
5.0000 mg | Freq: Once | INTRAVENOUS | Status: AC
  Administered 2022-06-22: 5 mg via INTRAVENOUS
  Filled 2022-06-22: qty 100

## 2022-06-22 MED ORDER — ACETAMINOPHEN 325 MG PO TABS
650.0000 mg | ORAL_TABLET | Freq: Once | ORAL | Status: AC
  Administered 2022-06-22: 650 mg via ORAL
  Filled 2022-06-22: qty 2

## 2022-06-22 MED ORDER — SODIUM CHLORIDE 0.9 % IV SOLN: INTRAVENOUS | Status: DC

## 2022-06-22 MED ORDER — DIPHENHYDRAMINE HCL 25 MG PO CAPS
25.0000 mg | ORAL_CAPSULE | Freq: Once | ORAL | Status: AC
  Administered 2022-06-22: 25 mg via ORAL
  Filled 2022-06-22: qty 1

## 2022-06-22 NOTE — Progress Notes (Signed)
Diagnosis: Osteoporosis  Provider:  Marshell Garfinkel MD  Procedure: Infusion  IV Type: Peripheral, IV Location: R Antecubital  Reclast (Zolendronic Acid), Dose: 5 mg  Infusion Start Time: 2841  Infusion Stop Time: 1202  Post Infusion IV Care: Observation period completed and Peripheral IV Discontinued  Discharge: Condition: Good, Destination: Home . AVS provided to patient.   Performed by:  Adelina Mings, LPN

## 2022-06-29 DIAGNOSIS — G629 Polyneuropathy, unspecified: Secondary | ICD-10-CM

## 2022-06-29 DIAGNOSIS — G8929 Other chronic pain: Secondary | ICD-10-CM

## 2022-06-29 MED ORDER — METHOCARBAMOL 500 MG PO TABS: 500.0000 mg | ORAL_TABLET | Freq: Three times a day (TID) | ORAL | 0 refills | Status: DC

## 2022-06-29 MED ORDER — GABAPENTIN 300 MG PO CAPS: ORAL_CAPSULE | ORAL | 0 refills | Status: DC

## 2022-07-08 ENCOUNTER — Encounter: Payer: Self-pay | Admitting: Physical Medicine & Rehabilitation

## 2022-07-08 ENCOUNTER — Encounter: Payer: Medicaid Other | Attending: Physical Medicine & Rehabilitation | Admitting: Physical Medicine & Rehabilitation

## 2022-07-08 VITALS — BP 138/82 | HR 102 | Temp 100.0°F | Ht 66.0 in | Wt 200.6 lb

## 2022-07-08 DIAGNOSIS — M47816 Spondylosis without myelopathy or radiculopathy, lumbar region: Secondary | ICD-10-CM | POA: Diagnosis present

## 2022-07-08 MED ORDER — LIDOCAINE HCL (PF) 2 % IJ SOLN: 4.0000 mL | Freq: Once | INTRAMUSCULAR | Status: AC

## 2022-07-08 MED ORDER — LIDOCAINE HCL 1 % IJ SOLN: 10.0000 mL | Freq: Once | INTRAMUSCULAR | Status: AC

## 2022-07-08 NOTE — Patient Instructions (Signed)

## 2022-07-08 NOTE — Progress Notes (Signed)
  PROCEDURE RECORD [facility]   Name: Barbara Stanley DOB:03/14/1971 MRN: [id number]  Date:07/08/2022  Physician: Alysia Penna, MD    Nurse/CMA: Areg Bialas RMA  Allergies:  Allergies  Allergen Reactions   Other     "Banana Bag" given to patient at physician's office, severe back pain, SOB   Nicotine Rash    Nicotine patch    Consent Signed: No.  Is patient diabetic? No.  CBG today? .  Pregnant: No. LMP: No LMP recorded (lmp unknown). Patient has had a hysterectomy. (age 65-55)  Anticoagulants: no Anti-inflammatory: no Antibiotics: no  Procedure: Bilateral L3, L4, L5 Medial Branch Block  Position: Prone Start Time: 2:36 PM  End Time: 2:49pm  Fluoro Time: 58  RN/CMA Gasper Hopes RMA Shamari Lofquist,RMA    Time 1:55pm   2:54pm    BP 138/82 132/94    Pulse 102 9\7    Respirations 16 16    O2 Sat 95 95    S/S 6 6    Pain Level 6/10 1/10     D/C home with Husband, patient A & O X 3, D/C instructions reviewed, and sits independently.

## 2022-07-08 NOTE — Progress Notes (Signed)
Bilateral Lumbar L3, L4  medial branch blocks and L 5 dorsal ramus injection under fluoroscopic guidance  Indication: Lumbar pain which is not relieved by medication management or other conservative care and interfering with self-care and mobility.  Informed consent was obtained after describing risks and benefits of the procedure with the patient, this includes bleeding, infection, paralysis and medication side effects.  The patient wishes to proceed and has given written consent.  The patient was placed in prone position.  The lumbar area was marked and prepped with Betadine.  One mL of 1% lidocaine was injected into each of 6 areas into the skin and subcutaneous tissue.  Then a 22-gauge 3.5in spinal needle was inserted targeting the junction of the left S1 superior articular process and sacral ala junction. Needle was advanced under fluoroscopic guidance.  Bone contact was made.Omnipaque 180 was injected x 0.5 mL demonstrating no intravascular uptake.  Then a solution  of 2% MPF lidocaine was injected x 0.5 mL.  Then the left L5 superior articular process in transverse process junction was targeted.  Bone contact was made. Omnipaque 180 was injected x 0.5 mL demonstrating no intravascular uptake. Then a solution containing  2% MPF lidocaine was injected x 0.5 mL.  Then the left L4 superior articular process in transverse process junction was targeted.  Bone contact was made. Omnipaque 180 was injected x 0.5 mL demonstrating no intravascular uptake.  Then a solution containing2% MPF lidocaine was injected x 0.5 mL.  This same procedure was performed on the right side using the same needle, technique and injectate.  Patient tolerated procedure well.  Post procedure instructions were given.   Lidocaine 1% with preservative multidose, 10ml no waste Lidocaine 2% MPF, 5ml bottle, 3ml used 2ml waste Omnipaque 180 1.5 ml used, 8.5 ml waste  

## 2022-07-12 ENCOUNTER — Ambulatory Visit: Payer: Medicaid Other | Admitting: Physical Medicine & Rehabilitation

## 2022-07-18 ENCOUNTER — Other Ambulatory Visit: Payer: Self-pay | Admitting: Family

## 2022-07-18 DIAGNOSIS — G8929 Other chronic pain: Secondary | ICD-10-CM

## 2022-07-27 ENCOUNTER — Ambulatory Visit (INDEPENDENT_AMBULATORY_CARE_PROVIDER_SITE_OTHER): Payer: Medicaid Other | Admitting: Gastroenterology

## 2022-07-27 ENCOUNTER — Encounter: Payer: Self-pay | Admitting: Gastroenterology

## 2022-07-27 VITALS — BP 118/72 | HR 104 | Ht 66.0 in | Wt 195.1 lb

## 2022-07-27 DIAGNOSIS — K219 Gastro-esophageal reflux disease without esophagitis: Secondary | ICD-10-CM | POA: Diagnosis not present

## 2022-07-27 DIAGNOSIS — K8689 Other specified diseases of pancreas: Secondary | ICD-10-CM | POA: Diagnosis not present

## 2022-07-27 DIAGNOSIS — R197 Diarrhea, unspecified: Secondary | ICD-10-CM

## 2022-07-27 MED ORDER — FAMOTIDINE 20 MG PO TABS: 20.0000 mg | ORAL_TABLET | Freq: Two times a day (BID) | ORAL | 3 refills | Status: AC

## 2022-07-27 MED ORDER — COLESTIPOL HCL 1 G PO TABS: 2.0000 g | ORAL_TABLET | Freq: Two times a day (BID) | ORAL | 3 refills | Status: DC

## 2022-07-27 MED ORDER — ESOMEPRAZOLE MAGNESIUM 40 MG PO CPDR: 40.0000 mg | DELAYED_RELEASE_CAPSULE | Freq: Two times a day (BID) | ORAL | 3 refills | Status: AC

## 2022-07-27 NOTE — Progress Notes (Signed)
Referring Provider: Teena Dunk, NP Primary Care Physician:  Bo Merino I, NP   Chief complaint:  Reflux   IMPRESSION:  GERD with small hiatal hernia PEI presenting with acute on chronic diarrhea    - diarrhea initially developed as a teenager with 4 BM at baseline    - hospitalized with associated hypokalemia    - C diff and culture negative    - improved with loperamide and lomotil and empiric metronidazole    - normal duodenal and random colon biopsies 09/26/19    - pancreatic elastase 114 12/31/19 suggesting EPI    - normal pancrease on contrasted CT 09/04/19    - temporary, but incomplete symptom relief with antibiotics for SIBO x 2 weeks    - symptoms controlled with colestipol Resolution of recent B12, folate, and iron deficiencies presenting with pancytopenia 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 Continue Nexium 40 mg BID and resume famotidine 20 mg BID. She will contact me if this does not improve her symptoms. Discussed long-term risks of PPI therapy including infections, possible interference with absorption with the potential for metabolic bone disease, potential increased risk of bone fractures, dementia/memory problems and the need for monitoring. We discussed the potential risks of use of these medicines with the benefit of their use. We discussed the option of discontinuation.  There obviously is a risk in both taking the medicines as well as stopping the medicines in certain disease processes. All questions were answered to the best of my ability.   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 Symptoms are controlled to her satisfaction off Creon using colestipol.  She is not ready to  quit smoking.   Colon cancer screening: She will be due surveillance colonoscopy in 2030.     PLAN: - Continue Nexium 40 mg BID and famotodine 20 mg BID (Refilled today) - Adjust colestipol to 2g BID (Refilled today) - Smoking cessation encouraged - Follow-up at least annually, earlier if needed   HPI: Barbara Stanley is a 51 y.o. female  who returns in follow-up. She was last seen 02/24/21. The interval history is obtained through the patient and review of her electronic health record. Gastric bypass performed 16 years ago [place], now with PEI presenting with acute on chronic diarrhea. Associated copper and B12 deficiencies have resolved. At it's worst, she was having 20 bowel movements daily. Baseline bowel habits following the bypass were diarrhea - but with 4 slightly formed BM daily. Was also having diffuse and upper abdominal abdominal pain that occurs immediately after eating or while eating despite Nexium QAM and famotidine QPM. Associated nausea. Occassional postprandial bowel movement that improves the pain.   Evaluation has included: - Upper GI series 07/12/2019: moderate gastroesophageal reflux, small hiatal hernia and inability to completely assess for esophageal motility given the patient's nausea during the exam.  No significant delay in contrast passage through the esophagus. - EGD 09/26/2019: normal esophagus, small hiatal hernia, evidence of gastric bypass with a small pouch that appeared normal and a normal appearing anastomosis. Duodenal biopsies were normal.   - Colonoscopy 09/26/2019: Normal.  Random colon biopsies from the right and left colon for microscopic colitis were normal.  Treatment trials:  - Success with colestipol 2 mg BID with two, soft formed stools daily -  Bentyl PRN abdominal pain with intermittent success - Probiotics, could not afford Align - Activa yogurt - Empiric metronidazole for possible SIBO - Daily benefiber - Creon initially provided  improvement in pain, did not change diarrhea  Returns today in follow-up.  She reports ongoing symptoms with heartburn and nausea. She is chemotherapy last week to prepare for a bone marrow transplant next week. She has required blood and platelet transfusions. She is having ongoing heartburn symptoms despite Nexium 40 mg BID. She was previously on famotidine 20 mg BID but apparently our office would not refill that. When using both the nexium and famotidine her reflux is well controlled. She is using colestipol QAM and then PRN for the remainder of the day with adequate control of her diarrhea to have one formed bowel movement daily.   Prior endoscopy:  EGD for reflux several years ago "somewhere in [place]."  EGD 09/26/2019: normal esophagus, small hiatal hernia, evidence of gastric bypass with a small pouch normal-appearing anastomosis, and normal small bowel.  Duodenal biopsies were normal. Colonoscopy 09/26/2019: normal.  Random colon biopsies for microscopic colitis were normal.   Abdominal imaging: Abdominal ultrasound for abnormal liver enzymes 08/09/18:  common bile duct of 6.6 mm status post cholecystectomy.  Spleen is upper limit of normal at 12.6 cm. Transaminases at that time were elevated. They have since been repeatedly normal on several rechecks.  UGI series 07/12/19: Moderate gastroesophageal reflux. Small hiatal hernia. Esophageal motility could not be completely assessed as the patient became nauseated during the exam and could not perform this portion. However throughout the exam no significant delay in contrast passage through the esophagus was observed. CT abd/pelvis with contrast 09/04/19: Unremarkable pancreas. No ductal dilatation or surrounding inflammatory changes. Splenomegaly. Prior gastric bypass. Multiple ventral well abdominal hernias that contain fat. Thickened appendix with diffuse intramural fatty deposition. No stranding or free fluid.   Past Medical History:  Diagnosis  Date   Abdominal pain    Anemia    Anxiety    Bronchitis    COPD (chronic obstructive pulmonary disease) (HCC)    Depression    bipolar   Diarrhea    Emphysema    GERD (gastroesophageal reflux disease)    History of blood transfusion    MDS (myelodysplastic syndrome) (HCC)    Splenomegaly    r/t low platelets   Thrombocytopenia (HCC)    Tobacco abuse     Past Surgical History:  Procedure Laterality Date   ABDOMINAL HYSTERECTOMY     c section x 3      CHOLECYSTECTOMY     GASTRIC BYPASS     UPPER GASTROINTESTINAL ENDOSCOPY     WISDOM TOOTH EXTRACTION      Current Outpatient Medications  Medication Sig Dispense Refill   acetaminophen (TYLENOL) 500 MG tablet Take 1,000 mg by mouth daily as needed for moderate pain.     albuterol (VENTOLIN HFA) 108 (90 Base) MCG/ACT inhaler Inhale 1 - 2 puffs into the lungs every 6  hours as needed for wheezing or shortness of breath. 18 g 0   busPIRone (BUSPAR) 5 MG tablet TAKE 1 TABLET(5 MG) BY MOUTH THREE TIMES DAILY (Patient taking differently: Take 5 mg by mouth 3 (three) times daily.) 90 tablet 0   cholecalciferol (VITAMIN D3) 25 MCG (1000 UT) tablet Take 5,000 Units by mouth daily.     colestipol (COLESTID) 1 g tablet Take 2 tablets (2 g total) by mouth 2 (two) times daily. 120 tablet 0   Copper Gluconate  2 MG CAPS Take 4 mg by mouth daily.     diclofenac sodium (VOLTAREN) 1 % GEL Apply 2 g topically 4 (four) times daily. (Patient taking differently: Apply 2 g topically daily as needed (for pain).) 2 g 3   dicyclomine (BENTYL) 20 MG tablet Take 1 tablet (20 mg total) by mouth in the morning, at noon, in the evening, and at bedtime. (Patient taking differently: Take 20 mg by mouth 4 (four) times daily as needed for spasms.) 120 tablet 3   esomeprazole (NEXIUM) 40 MG capsule Take 1 capsule (40 mg total) by mouth 2 (two) times daily. Please keep your 8-30 appointment for further refills. Thanks you 60 capsule 1   famotidine (PEPCID) 20 MG  tablet Take 1 tablet (20 mg total) by mouth 2 (two) times daily. 180 tablet 3   fluticasone-salmeterol (ADVAIR HFA) 230-21 MCG/ACT inhaler Inhale 2 puffs into the lungs 2 (two) times daily. 12 g 5   folic acid (FOLVITE) 1 MG tablet TAKE 1 TABLET(1 MG) BY MOUTH DAILY (Patient taking differently: Take 1 mg by mouth daily.) 90 tablet 2   gabapentin (NEURONTIN) 300 MG capsule TAKE 1 CAPSULE(300 MG) BY MOUTH TWICE DAILY 90 capsule 0   ipratropium-albuterol (DUONEB) 0.5-2.5 (3) MG/3ML SOLN Take 3 mLs by nebulization every 6 hours as needed. 360 mL 0   methocarbamol (ROBAXIN) 500 MG tablet TAKE 1 TABLET(500 MG) BY MOUTH THREE TIMES DAILY 30 tablet 0   promethazine (PHENERGAN) 25 MG tablet Take 1 tablet (25 mg total) by mouth every 6 (six) hours as needed for refractory nausea / vomiting. 30 tablet 0   Tiotropium Bromide Monohydrate (SPIRIVA RESPIMAT) 2.5 MCG/ACT AERS Inhale 2 puffs into the lungs daily. 4 g 5   traZODone (DESYREL) 150 MG tablet Take 1 tablet (150 mg total) by mouth at bedtime. For sleep (Patient taking differently: Take 400 mg by mouth at bedtime. For sleep) 30 tablet 0   venlafaxine XR (EFFEXOR-XR) 75 MG 24 hr capsule Take 1 capsule (75 mg total) by mouth daily with breakfast. For depression (Patient taking differently: Take 225 mg by mouth daily with breakfast. For depression) 30 capsule 0   Current Facility-Administered Medications  Medication Dose Route Frequency Provider Last Rate Last Admin   lidocaine (XYLOCAINE) 1 % (with pres) injection 10 mL  10 mL Other Once Kirsteins, Luanna Salk, MD       lidocaine HCl (PF) (XYLOCAINE) 2 % injection 4 mL  4 mL Other Once Charlett Blake, MD        Allergies as of 07/27/2022 - Review Complete 07/27/2022  Allergen Reaction Noted   Other  08/08/2018   Nicotine Rash 12/18/2021    Family History  Problem Relation Age of Onset   Diabetes Mother    Heart disease Father    Diabetes Other    Hypertension Other    CAD Other    Colon cancer  Neg Hx    Rectal cancer Neg Hx    Stomach cancer Neg Hx    Esophageal cancer Neg Hx       Physical Exam: Vital signs were reviewed. General:   Alert, well-nourished, pleasant and cooperative in NAD. Bright affect.  Abdomen:  Soft, nontender, central obesity, normal bowel sounds. No rebound or guarding. No hepatosplenomegaly. Well healed midline surgical scar.  Neurologic:  Alert and  oriented x4;  grossly nonfocal Skin:  No rash or bruise. Palmar erythema present. No spider angioma.  Psych:  Alert and cooperative. Normal mood and  affect.   Helana Macbride L. Tarri Glenn Md, MPH [facility] 07/27/2022, 10:09 AM

## 2022-07-27 NOTE — Patient Instructions (Addendum)
Continue Nexium 40 mg twice daily and famotodine 20 mg twice daily (Refilled today)  Continue to use colestipol as we discussed (Refilled today)  Smoking cessation encouraged  Follow-up at least annually, earlier if needed  _______________________________________________________  If you are age 51 or older, your body mass index should be between 23-30. Your Body mass index is 31.49 kg/m. If this is out of the aforementioned range listed, please consider follow up with your Primary Care Provider.  If you are age 22 or younger, your body mass index should be between 19-25. Your Body mass index is 31.49 kg/m. If this is out of the aformentioned range listed, please consider follow up with your Primary Care Provider.   ________________________________________________________  The [facility] providers would like to encourage you to use [facility] to communicate with providers for non-urgent requests or questions.  Due to long hold times on the telephone, sending your provider a message by [facility] may be a faster and more efficient way to get a response.  Please allow 48 business hours for a response.  Please remember that this is for non-urgent requests.  _______________________________________________________

## 2022-08-18 ENCOUNTER — Ambulatory Visit: Payer: Medicaid Other | Admitting: Physical Medicine & Rehabilitation

## 2022-08-21 ENCOUNTER — Other Ambulatory Visit: Payer: Self-pay | Admitting: Primary Care

## 2022-08-21 ENCOUNTER — Other Ambulatory Visit: Payer: Self-pay | Admitting: Family

## 2022-08-21 DIAGNOSIS — G629 Polyneuropathy, unspecified: Secondary | ICD-10-CM

## 2022-08-21 DIAGNOSIS — G8929 Other chronic pain: Secondary | ICD-10-CM

## 2022-08-23 ENCOUNTER — Encounter: Payer: Medicaid Other | Admitting: Physical Medicine & Rehabilitation

## 2022-08-23 ENCOUNTER — Encounter: Payer: Self-pay | Admitting: Physical Medicine & Rehabilitation

## 2022-08-23 DIAGNOSIS — M8000XD Age-related osteoporosis with current pathological fracture, unspecified site, subsequent encounter for fracture with routine healing: Secondary | ICD-10-CM | POA: Insufficient documentation

## 2022-08-23 DIAGNOSIS — M47816 Spondylosis without myelopathy or radiculopathy, lumbar region: Secondary | ICD-10-CM

## 2022-08-23 NOTE — Progress Notes (Unsigned)
Pt scheduled for lumbar medial branch blocks for chronic low back pain , had good relief with first set , here for second set.  Saw hematologist yesterday for myelodysplastic disorder.  Neutrophil count was 0.8 , pt on prophyllactic abx.  Will contact hematologist at [facility] , Dr Ronnie Doss for guidelines regarding lumbar injections in pt with low WBCs  Will reschedule visit once clarification is obtained

## 2022-08-24 ENCOUNTER — Other Ambulatory Visit: Payer: Self-pay | Admitting: Family

## 2022-08-24 DIAGNOSIS — G8929 Other chronic pain: Secondary | ICD-10-CM

## 2022-08-29 DIAGNOSIS — R519 Headache, unspecified: Secondary | ICD-10-CM | POA: Insufficient documentation

## 2022-08-29 DIAGNOSIS — R42 Dizziness and giddiness: Secondary | ICD-10-CM | POA: Insufficient documentation

## 2022-09-14 ENCOUNTER — Encounter: Payer: Self-pay | Admitting: Physical Medicine & Rehabilitation

## 2022-09-30 ENCOUNTER — Ambulatory Visit: Payer: Self-pay | Admitting: Nurse Practitioner

## 2022-10-03 ENCOUNTER — Ambulatory Visit
Admission: RE | Admit: 2022-10-03 | Discharge: 2022-10-03 | Disposition: A | Discharge status: Home / Self Care | Payer: Medicaid Other | Source: Ambulatory Visit | Attending: Acute Care | Admitting: Acute Care

## 2022-10-03 DIAGNOSIS — F17210 Nicotine dependence, cigarettes, uncomplicated: Secondary | ICD-10-CM

## 2022-10-03 DIAGNOSIS — Z87891 Personal history of nicotine dependence: Secondary | ICD-10-CM

## 2022-10-05 ENCOUNTER — Other Ambulatory Visit: Payer: Self-pay

## 2022-10-05 DIAGNOSIS — F17210 Nicotine dependence, cigarettes, uncomplicated: Secondary | ICD-10-CM

## 2022-10-05 DIAGNOSIS — Z87891 Personal history of nicotine dependence: Secondary | ICD-10-CM

## 2022-10-05 NOTE — Progress Notes (Signed)
Ct

## 2022-10-10 ENCOUNTER — Other Ambulatory Visit: Payer: Self-pay | Admitting: Family

## 2022-10-10 DIAGNOSIS — G8929 Other chronic pain: Secondary | ICD-10-CM

## 2022-10-10 DIAGNOSIS — G629 Polyneuropathy, unspecified: Secondary | ICD-10-CM

## 2022-10-21 ENCOUNTER — Other Ambulatory Visit (HOSPITAL_COMMUNITY): Payer: Self-pay

## 2022-11-14 ENCOUNTER — Telehealth: Payer: Self-pay | Admitting: Gastroenterology

## 2022-11-14 NOTE — Telephone Encounter (Signed)
Patient called states she is having issues with her stomach requested to speak with a  nurse.

## 2022-11-16 NOTE — Telephone Encounter (Signed)
PT returning call. Requesting callback 

## 2022-11-16 NOTE — Telephone Encounter (Signed)
Left message for pt to call back  °

## 2022-11-18 NOTE — Telephone Encounter (Signed)
Spoke with pt. Pt reports that her cancer doctor Dr. Ronnie Doss, who she see for MDS,  wanted her to have an endoscopy procedure. Pt reports she has upper and lower abdominal pain, nausea without vomiting, diarrhea 4x per day and weight loss. Pt reports she now weighs 180 pounds. At pt's last appt here she was 195.  Pt also reports she has low iron and low magnesium. Pt is now taking iron twice a day.  Pt scheduled for appt with Tye Savoy, NP on 11/24/22 at 1:30 pm. Pt verbalized understanding.

## 2022-11-24 ENCOUNTER — Ambulatory Visit: Payer: Medicaid Other | Admitting: Nurse Practitioner

## 2022-12-01 ENCOUNTER — Other Ambulatory Visit: Payer: Self-pay | Admitting: Family Medicine

## 2022-12-01 ENCOUNTER — Ambulatory Visit
Admission: RE | Admit: 2022-12-01 | Discharge: 2022-12-01 | Disposition: A | Discharge status: Home / Self Care | Payer: Medicaid Other | Source: Ambulatory Visit | Attending: Family Medicine | Admitting: Family Medicine

## 2022-12-01 DIAGNOSIS — Z1231 Encounter for screening mammogram for malignant neoplasm of breast: Secondary | ICD-10-CM

## 2022-12-20 NOTE — Progress Notes (Unsigned)
12/21/2022 Barbara Stanley [id number] 27-Sep-1971  Referring provider: Magdalen Spatz, NP Primary GI doctor: Dr. Tarri Glenn  ASSESSMENT AND PLAN:   IDA, previous gastric bypass, MDS on chemotherapy x 07/2022 in preparation for stem cell Ferritin 7 in Dec, improved to 14 with iron twice daily tablets EGD 09/26/2019: normal esophagus, small hiatal hernia, evidence of gastric bypass with a small pouch that appeared normal and a normal appearing anastomosis.  Duodenal biopsies were normal.   Colonoscopy 09/26/2019: Normal.  Random colon biopsies from the right and left colon for microscopic colitis were normal. Recall 09/2029 Patient is also been having upper abdominal pain, worsening diarrhea, while this can be associated with the chemotherapy she is on we will plan on evaluating with updated endoscopy colonoscopy with new onset IDA. Will schedule further out for coordination purposes and discussion with Dr. Tarri Glenn for her final recommendation in this complicated patient. Continue PPI twice daily, add on Carafate. Will rule out infectious diarrhea with Diatherix Patient has had severe thrombocytopenia with platelets as low as 19 receiving platelet infusions, will need to coordinate with hematology in regards to platelet infusion with goal of 50 prior to endoscopic evaluation.  Worsening diarrhea for past 4 weeks associated with weight loss, AB pain and nausea in patient on chemotherapy for MDS, history of EPI, cholecystectomy, gastric bypass Given trial of Zenpep states did not help with diarrhea in the past but can try, if helps will send in rX Worsening diarrhea over 3 weeks, patient immunocompromise, antibiotics 6 months ago, will check GI pathogen panel with Diatherix.  Patient will take this home and do it, declines getting done here in the office. Can increase Colestid to 2 g up to 3 times daily Scheduled for colonoscopy and endoscopy, can evaluate for microscopic colitis if Diatherix  negative. If unremarkable or not improving likely related to chemotherapy induced diarrhea Can consider trial for SIBO  Gastroesophageal reflux disease, unspecified whether esophagitis present Continue PPI twice daily, add on Carafate.  MDS (myelodysplastic syndrome with pancytopenia Severe thrombocytopenia last platelets were 19  patient's been getting repeated platelet transfusions. Will need to coordinate with [facility] to get platelets above 50 prior to endoscopic evaluation. Will need to check CBC prior to endoscopy   Patient Care Team: Magdalen Spatz, NP as PCP - General (Pulmonary Disease)  HISTORY OF PRESENT ILLNESS: 52 y.o. female with a past medical history of COPD, B12 deficiency, GERD, thrombocytopenia, MDS,  status post gastric bypass and others listed below presents for evaluation of weight loss associate with upper and lower abdominal pain, nausea without vomiting and diarrhea.   07/27/2022 office visit with Dr. Tarri Glenn for pancreatic insufficiency and reflux Gastric bypass in [place] 16 years ago so she with  EPI   Previous copper and B12 deficiencies resolved.  Improved with Creon  July 2021 repeat marrow demonstrated to clonal disease more definitively low-grade MDS. Patient continued to have cytopenias and repeat bone marrow 07/12/2022 showed hypercellular marrow with 90% with persistent involvement mild dysplastic syndrome and fibrosis no increased blast 07/25/2022 started chemotherapy for leukemia MDS at [place] with Dr.Wieduwilt. Noted nausea, fatigue and malaise during the week of treatment and improves when she completes her cycle.  Managed with Zofran, Phenergan crackers.   Both PPI and H2 blocker twice daily for GERD.   Was complaining of diarrhea up to 4 times daily, Colestid only helped slightly.  Found to have iron deficiency anemia 10/13/2022 found to have ferritin 7, percent saturation 7, TIBC 480,  iron 33.  Started on ferrous sulfate 325 with  vitamin C twice daily.  Hgb 11.3, MCV 91.5, WBC 2.4 and platelets 54 at the time. 12/06/2022 iron 157, ferritin 14, percent saturation 46. 12/19/2022 CBC with [facility] showed WBC 2.5, HGB improved with iron to 11.4, platelets 19. S/p multiple platelet transfusions. Plan was to have chemotherapy and stem cell transplant, on donor list for hopefully within next year Last infusion 12/20/2022  States she has been having worsening diarrhea in last 3-4 weeks.  She is colestid 2 mg in AM and PM.  Her need for BM will wake her up at [facility] and has recently had 2 episodes fecal incontinence at night watery loose stools/sand, has urinary incontinence and wears depends.  She then takes the colestid and it helps but will have another episode of diarrhea at night and have to take the colestid again.  She has a lot of gas, no bloating.  She has epigastric pain sharp squeezing pain with any food, worse with solids, better liquids but water can cause it too if she eats/drinks too much. Will have nausea. No radiation to her back.  She is on iron so has had dark stools but no blood in stool.  She has chills on and off, no fevers.  She was on creon but stopped at some point.  She has had nausea, no vomiting since gastric bypass.  She was having associated weight loss, dropped 30 lbs quickly but this has stabilized.  Was on probiotic but stopped, not helping.  She is on PPI BID, states has some minor GERD prior to next dose but otherwise well controlled.  No dysphagia. Felt imodium and bentyl did not help.  No sick contacts, had ABX around 5-6 months ago for leukopenia.   Wt Readings from Last 5 Encounters:  12/21/22 181 lb (82.1 kg)  07/27/22 195 lb 2 oz (88.5 kg)  07/08/22 200 lb 9.6 oz (91 kg)  06/22/22 198 lb 9.6 oz (90.1 kg)  04/08/22 213 lb (96.6 kg)     Previous evaluation  Upper GI series 07/12/2019: moderate gastroesophageal reflux, small hiatal hernia and inability to completely assess for esophageal  motility given the patient's nausea during the exam.  No significant delay in contrast passage through the esophagus. CT abdomen pelvis with contrast 09/04/2019 for abdominal pain and thrombocytopenia normal appendix, small pericardial effusion, aortic atherosclerosis, splenomegaly, status post gastric bypass, small ventral abdominal wall hernia - EGD 09/26/2019: normal esophagus, small hiatal hernia, evidence of gastric bypass with a small pouch that appeared normal and a normal appearing anastomosis. Duodenal biopsies were normal.   - Colonoscopy 09/26/2019: Normal.  Random colon biopsies from the right and left colon for microscopic colitis were normal. Recall 09/2029   Treatment trials:  - Success with colestipol 2 mg BID with two, soft formed stools daily - Bentyl PRN abdominal pain with intermittent success - Probiotics, could not afford Align - Activa yogurt - Empiric metronidazole for possible SIBO - Daily benefiber - Creon initially provided improvement in pain, did not change diarrhea  She  reports that she has been smoking cigarettes. She has a 7.50 pack-year smoking history. She has never used smokeless tobacco. She reports that she does not currently use alcohol. She reports that she does not use drugs.  RELEVANT LABS AND IMAGING: CBC    Component Value Date/Time   WBC 2.4 (LL) 04/01/2022 1046   WBC 3.1 (L) 12/19/2021 0257   RBC 3.10 (L) 04/01/2022 1046   RBC  3.17 (L) 12/19/2021 0257   HGB 8.9 (L) 04/01/2022 1046   HCT 29.4 (L) 04/01/2022 1046   PLT 77 (LL) 04/01/2022 1046   MCV 95 04/01/2022 1046   MCH 28.7 04/01/2022 1046   MCH 30.0 12/19/2021 0257   MCHC 30.3 (L) 04/01/2022 1046   MCHC 31.1 12/19/2021 0257   RDW 14.2 04/01/2022 1046   LYMPHSABS 0.4 (L) 04/01/2022 1046   MONOABS 0.3 12/19/2021 0257   EOSABS 0.0 04/01/2022 1046   BASOSABS 0.0 04/01/2022 1046   Recent Labs    04/01/22 1046  HGB 8.9*     CMP     Component Value Date/Time   NA 141 04/01/2022  1046   K 4.3 04/01/2022 1046   CL 103 04/01/2022 1046   CO2 27 04/01/2022 1046   GLUCOSE 83 04/01/2022 1046   GLUCOSE 125 (H) 12/19/2021 0257   BUN 8 04/01/2022 1046   CREATININE 0.57 04/01/2022 1046   CREATININE 0.78 03/05/2020 1039   CREATININE 0.59 08/07/2017 1410   CALCIUM 8.7 04/01/2022 1046   PROT 5.7 (L) 04/01/2022 1046   ALBUMIN 4.0 04/01/2022 1046   AST 17 04/01/2022 1046   ALT 16 04/01/2022 1046   ALKPHOS 116 04/01/2022 1046   BILITOT 0.4 04/01/2022 1046   GFRNONAA >60 12/19/2021 0257   GFRNONAA >60 03/05/2020 1039   GFRNONAA 110 08/07/2017 1410   GFRAA >60 05/20/2020 0803   GFRAA >60 03/05/2020 1039   GFRAA 127 08/07/2017 1410      Latest Ref Rng & Units 04/01/2022   10:46 AM 12/17/2021    5:45 PM 09/10/2021   12:31 PM  Hepatic Function  Total Protein 6.0 - 8.5 g/dL 5.7  6.4  6.3   Albumin 3.8 - 4.9 g/dL 4.0  3.3  3.8   AST 0 - 40 IU/L '17  14  18   '$ ALT 0 - 32 IU/L '16  11  12   '$ Alk Phosphatase 44 - 121 IU/L 116  80  83   Total Bilirubin 0.0 - 1.2 mg/dL 0.4  0.8  0.5       Current Medications:     Current Outpatient Medications (Cardiovascular):    colestipol (COLESTID) 1 g tablet, Take 2 tablets (2 g total) by mouth 3 (three) times daily.   Current Outpatient Medications (Respiratory):    ADVAIR HFA [id number] MCG/ACT inhaler, INHALE 2 PUFFS INTO THE LUNGS TWICE DAILY, RINSE MOUTH WITH WATER AFTER USE   albuterol (VENTOLIN HFA) 108 (90 Base) MCG/ACT inhaler, Inhale 1 - 2 puffs into the lungs every 6  hours as needed for wheezing or shortness of breath.   ipratropium-albuterol (DUONEB) 0.5-2.5 (3) MG/3ML SOLN, Take 3 mLs by nebulization every 6 hours as needed.   promethazine (PHENERGAN) 25 MG tablet, Take 1 tablet (25 mg total) by mouth every 6 (six) hours as needed for refractory nausea / vomiting.   SPIRIVA RESPIMAT 2.5 MCG/ACT AERS, INHALE 2 PUFFS INTO THE LUNGS DAILY   Current Outpatient Medications (Analgesics):    acetaminophen (TYLENOL) 500 MG tablet,  Take 1,000 mg by mouth daily as needed for moderate pain.  Current Facility-Administered Medications (Analgesics):    lidocaine (XYLOCAINE) 1 % (with pres) injection 10 mL   lidocaine HCl (PF) (XYLOCAINE) 2 % injection 4 mL  Current Outpatient Medications (Hematological):    folic acid (FOLVITE) 1 MG tablet, TAKE 1 TABLET(1 MG) BY MOUTH DAILY (Patient taking differently: Take 1 mg by mouth daily.)   Current Outpatient Medications (Other):  azaCITIDine (VIDAZA) 100 MG SUSR, Inject 160 mg/m2 into the skin once.   busPIRone (BUSPAR) 5 MG tablet, TAKE 1 TABLET(5 MG) BY MOUTH THREE TIMES DAILY (Patient taking differently: Take 5 mg by mouth 3 (three) times daily.)   cholecalciferol (VITAMIN D3) 25 MCG (1000 UT) tablet, Take 5,000 Units by mouth daily.   Copper Gluconate 2 MG CAPS, Take 4 mg by mouth daily.   diclofenac sodium (VOLTAREN) 1 % GEL, Apply 2 g topically 4 (four) times daily. (Patient taking differently: Apply 2 g topically daily as needed (for pain).)   dicyclomine (BENTYL) 20 MG tablet, Take 1 tablet (20 mg total) by mouth in the morning, at noon, in the evening, and at bedtime. (Patient taking differently: Take 20 mg by mouth 4 (four) times daily as needed for spasms.)   esomeprazole (NEXIUM) 40 MG capsule, Take 1 capsule (40 mg total) by mouth 2 (two) times daily.   famotidine (PEPCID) 20 MG tablet, Take 1 tablet (20 mg total) by mouth 2 (two) times daily.   gabapentin (NEURONTIN) 300 MG capsule, TAKE 1 CAPSULE(300 MG) BY MOUTH TWICE DAILY   methocarbamol (ROBAXIN) 500 MG tablet, TAKE 1 TABLET(500 MG) BY MOUTH THREE TIMES DAILY   sucralfate (CARAFATE) 1 g tablet, Take 1 tablet (1 g total) by mouth 4 (four) times daily -  with meals and at bedtime.   traZODone (DESYREL) 150 MG tablet, Take 1 tablet (150 mg total) by mouth at bedtime. For sleep (Patient taking differently: Take 400 mg by mouth at bedtime. For sleep)   venlafaxine XR (EFFEXOR-XR) 75 MG 24 hr capsule, Take 1 capsule  (75 mg total) by mouth daily with breakfast. For depression (Patient taking differently: Take 225 mg by mouth daily with breakfast. For depression)   Medical History:  Past Medical History:  Diagnosis Date   Abdominal pain    Anemia    Anxiety    Bronchitis    COPD (chronic obstructive pulmonary disease) (HCC)    Depression    bipolar   Diarrhea    Emphysema    GERD (gastroesophageal reflux disease)    History of blood transfusion    MDS (myelodysplastic syndrome) (HCC)    Splenomegaly    r/t low platelets   Thrombocytopenia (HCC)    Tobacco abuse    Allergies:  Allergies  Allergen Reactions   Other     "Banana Bag" given to patient at physician's office, severe back pain, SOB   Nicotine Rash    Nicotine patch     Surgical History:  She  has a past surgical history that includes c section x 3 ; Cholecystectomy; Gastric bypass; Abdominal hysterectomy; Wisdom tooth extraction; and Upper gastrointestinal endoscopy. Family History:  Her family history includes CAD in an other family member; Diabetes in her mother and another family member; Heart disease in her father; Hypertension in an other family member.  REVIEW OF SYSTEMS  : All other systems reviewed and negative except where noted in the History of Present Illness.  PHYSICAL EXAM: BP 118/68   Pulse 93   Ht '5\' 6"'$  (1.676 m)   Wt 181 lb (82.1 kg)   LMP  (LMP Unknown)   SpO2 97%   BMI 29.21 kg/m  General Appearance: Well nourished, in no apparent distress. Head:   Normocephalic and atraumatic. Eyes:  sclerae anicteric,conjunctive pink  Respiratory: Respiratory effort normal, BS equal bilaterally without rales, rhonchi, wheezing. Cardio: RRR with no MRGs. Peripheral pulses intact.  Abdomen: Soft,  Obese ,active  bowel sounds. mild tenderness in the epigastrium and in the lower abdomen. Without guarding and Without rebound. No masses. Rectal: declines Musculoskeletal: Full ROM, Normal gait. Without edema. Skin:  Dry  and intact without significant lesions or rashes, does have bilateral ecchymosis.  Neuro: Alert and  oriented x4;  No focal deficits. Psych:  Cooperative. Normal mood and affect.    Vladimir Crofts, PA-C 10:56 AM

## 2022-12-21 ENCOUNTER — Encounter: Payer: Self-pay | Admitting: Physician Assistant

## 2022-12-21 ENCOUNTER — Ambulatory Visit (INDEPENDENT_AMBULATORY_CARE_PROVIDER_SITE_OTHER): Payer: Medicaid Other | Admitting: Physician Assistant

## 2022-12-21 VITALS — BP 118/68 | HR 93 | Ht 66.0 in | Wt 181.0 lb

## 2022-12-21 DIAGNOSIS — K8689 Other specified diseases of pancreas: Secondary | ICD-10-CM

## 2022-12-21 DIAGNOSIS — K219 Gastro-esophageal reflux disease without esophagitis: Secondary | ICD-10-CM | POA: Diagnosis not present

## 2022-12-21 DIAGNOSIS — D46Z Other myelodysplastic syndromes: Secondary | ICD-10-CM

## 2022-12-21 DIAGNOSIS — D696 Thrombocytopenia, unspecified: Secondary | ICD-10-CM

## 2022-12-21 DIAGNOSIS — D509 Iron deficiency anemia, unspecified: Secondary | ICD-10-CM

## 2022-12-21 DIAGNOSIS — A09 Infectious gastroenteritis and colitis, unspecified: Secondary | ICD-10-CM | POA: Diagnosis not present

## 2022-12-21 MED ORDER — ZENPEP 40000-126000 UNITS PO CPEP: ORAL_CAPSULE | ORAL | 0 refills | Status: AC

## 2022-12-21 MED ORDER — SUCRALFATE 1 G PO TABS: 1.0000 g | ORAL_TABLET | Freq: Three times a day (TID) | ORAL | 3 refills | Status: AC

## 2022-12-21 MED ORDER — COLESTIPOL HCL 1 G PO TABS: 2.0000 g | ORAL_TABLET | Freq: Three times a day (TID) | ORAL | 0 refills | Status: AC

## 2022-12-21 MED ORDER — NA SULFATE-K SULFATE-MG SULF 17.5-3.13-1.6 GM/177ML PO SOLN: 1.0000 | Freq: Once | ORAL | 0 refills | Status: AC

## 2022-12-21 NOTE — Patient Instructions (Addendum)
We are ordering a "Diatherix" stool testing for you to evaluate you for infection You have received a kit from our office today containing all necessary supplies to complete this test.  Please carefully read the stool collection instructions provided in the kit before opening the accompanying materials.   Important to remember: -Place the label from the top left corner of the laboratory request sheet onto the "puritan opti-swab" TUBE. -This label should include your full name and date of birth.  -After completing the test, you should secure the tube into the specimen biohazard bag.  -The laboratory request information sheet (including date and time of specimen collection) should be placed into the outside pocket of the specimen biohazard bag and returned to the [place] lab with 2 days of collection.   If it is greater than two days from collection you will be asked to repeat the test.  If the label is missing from the tube with your name and date of birth on it, you will have to repeat the test.  Any questions please message [place] on my chart or call the office at [phone]   Sending a medication called Carafate, this mechanically coats your stomach. Can cause constipation and darker stools. Take about 30 mins to 1 hour before food and before bed.  If the pill is too large to take, can dissolve it in water and a small orange juice glass or shot glass and take it more as a liquid.  Can do a trial of zenpep-samples provided( Take 2 capsules before meals and 1 capsule with snacks) and take before food, if this does not help increase the coelstid.   Can take colestid up to 3 x a day, last dose right before bed  You have been scheduled for an endoscopy and colonoscopy. Please follow the written instructions given to you at your visit today. Please pick up your prep supplies at the pharmacy within the next 1-3 days. If you use inhalers (even only as needed), please bring them with you on the  day of your procedure.  I appreciate the opportunity to care for you. Vicie Mutters, PA-C

## 2022-12-23 ENCOUNTER — Telehealth: Payer: Self-pay | Admitting: Physician Assistant

## 2022-12-23 NOTE — Telephone Encounter (Signed)
Received call from contact at [facility]. They are passing the message along to patient's primary oncology team that will reach out to discuss care/coordination further.

## 2022-12-23 NOTE — Telephone Encounter (Signed)
Patient with MDS currently going through chemotherapy in preparation for stem cell transplant with [facility] oncology. Having abdominal pain, nausea, diarrhea, and IDA. Was seen in the office and scheduled for outpatient endoscopy and colonoscopy, however patient has ongoing severe thrombocytopenia.  Last platelets were 19.  Has been getting ongoing platelet transfusions with [facility]. Discussed case with Dr. Tarri Glenn, will try to coordinate care with [facility] oncology.  Dr. Tarri Glenn also mention may be appropriate for patient to undergo EGD colonoscopy at [place] in order to better facilitate the procedure and the timing with platelet transfusion. Will discuss with patient's hematology/oncology team and see if we can arrange this.

## 2022-12-26 ENCOUNTER — Telehealth: Payer: Self-pay | Admitting: Physician Assistant

## 2022-12-26 NOTE — Telephone Encounter (Signed)
Patient had negative Diatherix, negative C. difficile. Called to try to give results but had to leave message. Patient is scheduled for EGD colonoscopy with Dr. Tarri Glenn but currently am trying to arrange for this to be done at [facility] due to severe thrombocytopenia from chemotherapy.  Would be easier for her to get EGD: At [facility] for timing with platelet transfusion. Can we cancel EGD: With Dr. Tarri Glenn.

## 2022-12-26 NOTE — Telephone Encounter (Addendum)
Negative diatherix GI stool profile/Cdiff.  Talked with Lattie Haw at [facility], [phone]. Will put in GI referral in [facility] to get colon/EGD there. Talked with patient, states she has had a bad week with HA, diarrhea, AB pain, nausea, decreased eating/drinking, feels dehydrated.  She has infusion tomorrow, will discuss with cancer center. May benefit from inpatient evaluation if symptoms continue.  Zenpep did not help.  Continue other medications.

## 2022-12-26 NOTE — Telephone Encounter (Signed)
Procedures have been cancelled with Dr Tarri Glenn

## 2022-12-26 NOTE — Telephone Encounter (Signed)
Inbound call from pt , she want to speak with a nurse regarding lab results .Please advise

## 2023-01-09 ENCOUNTER — Ambulatory Visit (INDEPENDENT_AMBULATORY_CARE_PROVIDER_SITE_OTHER): Payer: Medicaid Other | Admitting: Family Medicine

## 2023-01-09 ENCOUNTER — Encounter: Payer: Self-pay | Admitting: Family Medicine

## 2023-01-09 VITALS — BP 110/70 | HR 79 | Temp 98.1°F | Resp 16 | Ht 66.0 in | Wt 180.0 lb

## 2023-01-09 DIAGNOSIS — D693 Immune thrombocytopenic purpura: Secondary | ICD-10-CM | POA: Diagnosis not present

## 2023-01-09 DIAGNOSIS — Z23 Encounter for immunization: Secondary | ICD-10-CM | POA: Diagnosis not present

## 2023-01-09 DIAGNOSIS — K219 Gastro-esophageal reflux disease without esophagitis: Secondary | ICD-10-CM | POA: Diagnosis not present

## 2023-01-09 DIAGNOSIS — F17210 Nicotine dependence, cigarettes, uncomplicated: Secondary | ICD-10-CM

## 2023-01-09 DIAGNOSIS — Z7689 Persons encountering health services in other specified circumstances: Secondary | ICD-10-CM

## 2023-01-09 DIAGNOSIS — J449 Chronic obstructive pulmonary disease, unspecified: Secondary | ICD-10-CM | POA: Diagnosis not present

## 2023-01-09 NOTE — Progress Notes (Unsigned)
Patient is here to established care with provider today. Patient has many health concern they would like to discuss with provider today  Care gaps discuss at appointment today  

## 2023-01-11 ENCOUNTER — Encounter: Payer: Self-pay | Admitting: Family Medicine

## 2023-01-11 DIAGNOSIS — Z23 Encounter for immunization: Secondary | ICD-10-CM | POA: Diagnosis not present

## 2023-01-11 NOTE — Addendum Note (Signed)
Addended by: Melene Plan on: 01/11/2023 02:41 PM   Modules accepted: Orders

## 2023-01-11 NOTE — Progress Notes (Signed)
New Patient Office Visit  Subjective    Patient ID: Barbara Stanley, female    DOB: 1970-11-30  Age: 52 y.o. MRN: [id number]  CC:  Chief Complaint  Patient presents with   Establish Care    HPI Barbara Stanley presents to establish care and for review of chronic med issues. Patient denies acute complaints or concerns.    Outpatient Encounter Medications as of 01/09/2023  Medication Sig   nicotine (NICODERM CQ - DOSED IN MG/24 HOURS) 14 mg/24hr patch Place onto the skin.   ondansetron (ZOFRAN) 8 MG tablet Take by mouth.   rOPINIRole (REQUIP) 0.5 MG tablet    Venlafaxine HCl 225 MG TB24    acetaminophen (TYLENOL) 500 MG tablet Take 1,000 mg by mouth daily as needed for moderate pain.   ADVAIR HFA 230-21 MCG/ACT inhaler INHALE 2 PUFFS INTO THE LUNGS TWICE DAILY, RINSE MOUTH WITH WATER AFTER USE   albuterol (VENTOLIN HFA) 108 (90 Base) MCG/ACT inhaler Inhale 1 - 2 puffs into the lungs every 6  hours as needed for wheezing or shortness of breath.   azaCITIDine (VIDAZA) 100 MG SUSR Inject 160 mg/m2 into the skin once.   busPIRone (BUSPAR) 5 MG tablet TAKE 1 TABLET(5 MG) BY MOUTH THREE TIMES DAILY (Patient taking differently: Take 5 mg by mouth 3 (three) times daily.)   cholecalciferol (VITAMIN D3) 25 MCG (1000 UT) tablet Take 5,000 Units by mouth daily.   colestipol (COLESTID) 1 g tablet Take 2 tablets (2 g total) by mouth 3 (three) times daily.   Copper Gluconate 2 MG CAPS Take 4 mg by mouth daily.   diclofenac sodium (VOLTAREN) 1 % GEL Apply 2 g topically 4 (four) times daily. (Patient taking differently: Apply 2 g topically daily as needed (for pain).)   dicyclomine (BENTYL) 20 MG tablet Take 1 tablet (20 mg total) by mouth in the morning, at noon, in the evening, and at bedtime. (Patient taking differently: Take 20 mg by mouth 4 (four) times daily as needed for spasms.)   esomeprazole (NEXIUM) 40 MG capsule Take 1 capsule (40 mg total) by mouth 2 (two) times daily.   famotidine (PEPCID)  20 MG tablet Take 1 tablet (20 mg total) by mouth 2 (two) times daily.   folic acid (FOLVITE) 1 MG tablet TAKE 1 TABLET(1 MG) BY MOUTH DAILY (Patient taking differently: Take 1 mg by mouth daily.)   gabapentin (NEURONTIN) 300 MG capsule TAKE 1 CAPSULE(300 MG) BY MOUTH TWICE DAILY   ipratropium-albuterol (DUONEB) 0.5-2.5 (3) MG/3ML SOLN Take 3 mLs by nebulization every 6 hours as needed.   methocarbamol (ROBAXIN) 500 MG tablet TAKE 1 TABLET(500 MG) BY MOUTH THREE TIMES DAILY   Pancrelipase, Lip-Prot-Amyl, (ZENPEP) 40000-126000 units CPEP Take 2 capsules before meals and 1 capsule with snacks   promethazine (PHENERGAN) 25 MG tablet Take 1 tablet (25 mg total) by mouth every 6 (six) hours as needed for refractory nausea / vomiting.   SPIRIVA RESPIMAT 2.5 MCG/ACT AERS INHALE 2 PUFFS INTO THE LUNGS DAILY   sucralfate (CARAFATE) 1 g tablet Take 1 tablet (1 g total) by mouth 4 (four) times daily -  with meals and at bedtime.   traZODone (DESYREL) 150 MG tablet Take 1 tablet (150 mg total) by mouth at bedtime. For sleep (Patient taking differently: Take 400 mg by mouth at bedtime. For sleep)   [DISCONTINUED] venlafaxine XR (EFFEXOR-XR) 75 MG 24 hr capsule Take 1 capsule (75 mg total) by mouth daily with breakfast. For depression (Patient taking differently:  Take 225 mg by mouth daily with breakfast. For depression)   Facility-Administered Encounter Medications as of 01/09/2023  Medication   lidocaine (XYLOCAINE) 1 % (with pres) injection 10 mL   lidocaine HCl (PF) (XYLOCAINE) 2 % injection 4 mL    Past Medical History:  Diagnosis Date   Abdominal pain    Anemia    Anxiety    Bronchitis    COPD (chronic obstructive pulmonary disease) (HCC)    Depression    bipolar   Diarrhea    Emphysema    GERD (gastroesophageal reflux disease)    History of blood transfusion    MDS (myelodysplastic syndrome) (HCC)    Splenomegaly    r/t low platelets   Thrombocytopenia (HCC)    Tobacco abuse     Past  Surgical History:  Procedure Laterality Date   ABDOMINAL HYSTERECTOMY     c section x 3      CHOLECYSTECTOMY     GASTRIC BYPASS     UPPER GASTROINTESTINAL ENDOSCOPY     WISDOM TOOTH EXTRACTION      Family History  Problem Relation Age of Onset   Diabetes Mother    Heart disease Father    Diabetes Other    Hypertension Other    CAD Other    Colon cancer Neg Hx    Rectal cancer Neg Hx    Stomach cancer Neg Hx    Esophageal cancer Neg Hx     Social History   Socioeconomic History   Marital status: Married    Spouse name: Not on file   Number of children: 3   Years of education: Not on file   Highest education level: Not on file  Occupational History   Occupation: disabled  Tobacco Use   Smoking status: Some Days    Packs/day: 0.25    Years: 30.00    Additional pack years: 0.00    Total pack years: 7.50    Types: Cigarettes    Last attempt to quit: 01/31/2021    Years since quitting: 1.9   Smokeless tobacco: Never   Tobacco comments:    5 cigs a day  Vaping Use   Vaping Use: Never used  Substance and Sexual Activity   Alcohol use: Not Currently    Comment: occasional wine   Drug use: No   Sexual activity: Yes    Birth control/protection: None    Comment: Hysterectomy  Other Topics Concern   Not on file  Social History Narrative   Not on file   Social Determinants of Health   Financial Resource Strain: Not on file  Food Insecurity: Not on file  Transportation Needs: Not on file  Physical Activity: Not on file  Stress: Not on file  Social Connections: Not on file  Intimate Partner Violence: Not on file    Review of Systems  All other systems reviewed and are negative.       Objective    BP 110/70   Pulse 79   Temp 98.1 F (36.7 C) (Oral)   Resp 16   Ht 5\' 6"  (1.676 m)   Wt 180 lb (81.6 kg)   LMP  (LMP Unknown)   SpO2 93%   BMI 29.05 kg/m   Physical Exam Vitals and nursing note reviewed.  Constitutional:      General: She is not in  acute distress. Cardiovascular:     Rate and Rhythm: Normal rate and regular rhythm.  Pulmonary:     Effort: Pulmonary effort  is normal.     Breath sounds: Normal breath sounds.  Abdominal:     Palpations: Abdomen is soft.     Tenderness: There is no abdominal tenderness.  Neurological:     General: No focal deficit present.     Mental Status: She is alert and oriented to person, place, and time.         Assessment & Plan:   1. Chronic obstructive pulmonary disease, unspecified COPD type ([place]) Management as per consultant  2. Gastroesophageal reflux disease, unspecified whether esophagitis present Management as per consultant  3. Chronic ITP (idiopathic thrombocytopenia) (HCC) Management as per consultant  4. Need for vaccination  - Hepatitis A vaccine adult IM - Varicella-zoster vaccine IM  5. Encounter to establish care   No follow-ups on file.   Becky Sax, MD

## 2023-01-20 DIAGNOSIS — J9611 Chronic respiratory failure with hypoxia: Secondary | ICD-10-CM | POA: Insufficient documentation

## 2023-01-25 ENCOUNTER — Telehealth: Payer: Self-pay | Admitting: *Deleted

## 2023-01-25 NOTE — Telephone Encounter (Signed)
Patient is scheduled with [facility] on 02/27/23

## 2023-01-25 NOTE — Telephone Encounter (Signed)
-----   Message from Tressia Danas, MD sent at 01/25/2023  6:36 AM EDT ----- Forest Becker and I had discussed that this patient should have her procedures performed through the team at [facility] so that they can coordinate with oncology. I do not see a note from GI in [facility] yet. Would you please confirm that they have the referral and are working to get the patient a follow-up appointment?  Thank you.  KLB ----- Message ----- From: Doree Albee, PA-C Sent: 12/21/2022  11:58 AM EDT To: Tressia Danas, MD  Set up for EGD/colon with IDA and symptoms but set further out (last day possible for you) in case we want CT or to wait on symptoms.  She has the MDS and severe thrombocytopenia, will need to coordinate with hematology if we continue.

## 2023-02-02 ENCOUNTER — Encounter: Payer: Medicaid Other | Admitting: Gastroenterology

## 2023-04-14 ENCOUNTER — Other Ambulatory Visit: Payer: Self-pay | Admitting: Primary Care

## 2023-04-28 DIAGNOSIS — R5381 Other malaise: Secondary | ICD-10-CM | POA: Insufficient documentation

## 2023-05-20 ENCOUNTER — Other Ambulatory Visit: Payer: Self-pay | Admitting: Primary Care

## 2023-05-25 DIAGNOSIS — D469 Myelodysplastic syndrome, unspecified: Secondary | ICD-10-CM | POA: Insufficient documentation

## 2023-06-15 DIAGNOSIS — Z9484 Stem cells transplant status: Secondary | ICD-10-CM | POA: Insufficient documentation

## 2023-07-04 DIAGNOSIS — R1010 Upper abdominal pain, unspecified: Secondary | ICD-10-CM | POA: Insufficient documentation

## 2023-07-12 ENCOUNTER — Ambulatory Visit: Payer: Medicaid Other | Admitting: Family Medicine

## 2023-08-25 ENCOUNTER — Emergency Department (HOSPITAL_BASED_OUTPATIENT_CLINIC_OR_DEPARTMENT_OTHER)
Admission: EM | Admit: 2023-08-25 | Discharge: 2023-08-25 | Disposition: A | Discharge status: Home / Self Care | Payer: MEDICAID | Attending: Emergency Medicine | Admitting: Emergency Medicine

## 2023-08-25 ENCOUNTER — Other Ambulatory Visit: Payer: Self-pay

## 2023-08-25 ENCOUNTER — Encounter (HOSPITAL_BASED_OUTPATIENT_CLINIC_OR_DEPARTMENT_OTHER): Payer: Self-pay

## 2023-08-25 ENCOUNTER — Ambulatory Visit
Admission: EM | Admit: 2023-08-25 | Discharge: 2023-08-25 | Disposition: A | Discharge status: Other Acute Inpatient Hospital | Payer: MEDICAID

## 2023-08-25 DIAGNOSIS — Z7951 Long term (current) use of inhaled steroids: Secondary | ICD-10-CM | POA: Insufficient documentation

## 2023-08-25 DIAGNOSIS — L0291 Cutaneous abscess, unspecified: Secondary | ICD-10-CM

## 2023-08-25 DIAGNOSIS — J449 Chronic obstructive pulmonary disease, unspecified: Secondary | ICD-10-CM | POA: Diagnosis not present

## 2023-08-25 DIAGNOSIS — F17210 Nicotine dependence, cigarettes, uncomplicated: Secondary | ICD-10-CM | POA: Diagnosis not present

## 2023-08-25 DIAGNOSIS — K611 Rectal abscess: Secondary | ICD-10-CM | POA: Insufficient documentation

## 2023-08-25 MED ORDER — SULFAMETHOXAZOLE-TRIMETHOPRIM 800-160 MG PO TABS: 1.0000 | ORAL_TABLET | Freq: Two times a day (BID) | ORAL | 0 refills | Status: AC

## 2023-08-25 MED ORDER — LIDOCAINE-EPINEPHRINE (PF) 2 %-1:200000 IJ SOLN
10.0000 mL | Freq: Once | INTRAMUSCULAR | Status: AC
  Administered 2023-08-25: 10 mL
  Filled 2023-08-25: qty 20

## 2023-08-25 NOTE — ED Provider Notes (Signed)
[place] EMERGENCY DEPARTMENT AT [facility] Provider Note   CSN: [id number] Arrival date & time: 08/25/23  1251     History  Chief Complaint  Patient presents with   Abscess    Barbara Stanley is a 52 y.o. female.   Abscess   52 year old female presents to the ED with complaints of abscess.  Patient reports abscess in rectal area that was first noticed on Wednesday.  Denies history of similar symptoms in the past.  Was seen in urgent care and sent to the emergency department for further dilation given location of abscess.  Denies any drainage, fever.  States that it does not necessarily hurt when she has a bowel movement but only when area externally is irritated usually from wiping.  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 Authorizing Provider  sulfamethoxazole-trimethoprim (BACTRIM DS) 800-160 MG tablet Take 1 tablet by mouth 2 (two) times daily for 7 days. 08/25/23 09/01/23 Yes Sherian Maroon A, PA  acetaminophen (TYLENOL) 500 MG tablet Take 1,000 mg by mouth daily as needed for moderate pain.    [provider]  acyclovir (ZOVIRAX) 400 MG tablet Take 400 mg by mouth. 07/22/22   [provider]  acyclovir (ZOVIRAX) 800 MG tablet Take 800 mg by mouth 2 (two) times daily. 06/20/23   [provider]  ADVAIR HFA 230-21 MCG/ACT inhaler INHALE 2 PUFFS INTO THE LUNGS TWICE DAILY. RINSE MOUTH WITH WATER AFTER USE 05/23/23   Glenford Bayley, NP  albuterol (VENTOLIN HFA) 108 (90 Base) MCG/ACT inhaler Inhale 1 - 2 puffs into the lungs every 6  hours as needed for wheezing or shortness of breath. 12/20/21   Arrien, York Ram, MD  albuterol (VENTOLIN HFA) 108 (90 Base) MCG/ACT inhaler Inhale 2 puffs into the lungs every 4 (four) hours as needed for wheezing or  shortness of breath. 02/09/21   [provider]  alteplase (CATHFLO ACTIVASE) 2 MG injection 2 mg. 12/06/22   [provider]  ascorbic acid (VITAMIN C) 500 MG tablet Take 500 mg by mouth 2 (two) times daily. 11/15/22   [provider]  azaCITIDine (VIDAZA) 100 MG SUSR Inject 160 mg/m2 into the skin once.    [provider]  busPIRone (BUSPAR) 15 MG tablet Take 1 tablet by mouth at bedtime. 06/20/23   [provider]  busPIRone (BUSPAR) 5 MG tablet TAKE 1 TABLET(5 MG) BY MOUTH THREE TIMES DAILY Patient taking differently: Take 15 mg by mouth at bedtime. 08/17/20   Barbette Merino, NP  calcium carbonate (OS-CAL) 600 MG tablet Take 600 mg by mouth. 06/17/22   [provider]  cholecalciferol (VITAMIN D3) 25 MCG (1000 UT) tablet Take 5,000 Units by mouth daily.    [provider]  colestipol (COLESTID) 1 g tablet Take 2 tablets (2 g total) by mouth 3 (three) times daily. 12/21/22 03/21/23  Doree Albee, PA-C  colestipol (COLESTID) 1 g tablet Take 1 g by mouth as directed. 06/20/23   [provider]  Copper Gluconate 2 MG CAPS Take 4 mg by mouth daily.    [provider]  dapsone 100 MG tablet Take 1 tablet by mouth daily. 06/29/23 12/26/23  [provider]  diclofenac sodium (VOLTAREN) 1 % GEL Apply 2 g topically 4 (four) times daily. Patient taking differently: Apply 2 g topically daily  as needed (for pain). 07/24/19   Kallie Locks, FNP  dicyclomine (BENTYL) 20 MG tablet Take 1 tablet (20 mg total) by mouth in the morning, at noon, in the evening, and at bedtime. Patient taking differently: Take 20 mg by mouth 4 (four) times daily as needed for spasms. 02/24/21   Tressia Danas, MD  esomeprazole (NEXIUM) 40 MG capsule Take 1 capsule (40 mg total) by mouth 2 (two) times daily. 07/27/22   Tressia Danas, MD  famotidine (PEPCID) 20 MG tablet Take 1 tablet (20 mg total) by mouth 2 (two) times daily. 07/27/22    Tressia Danas, MD  ferrous sulfate 325 (65 FE) MG EC tablet Take 325 mg by mouth 2 (two) times daily. 11/15/22   [provider]  fluconazole (DIFLUCAN) 200 MG tablet Take 400 mg by mouth daily. 06/21/23   [provider]  folic acid (FOLVITE) 1 MG tablet TAKE 1 TABLET(1 MG) BY MOUTH DAILY Patient taking differently: Take 1 mg by mouth daily. 03/26/20   Artis Delay, MD  folic acid (FOLVITE) 1 MG tablet Take 1 tablet by mouth daily. 06/20/23 06/19/24  [provider]  gabapentin (NEURONTIN) 300 MG capsule TAKE 1 CAPSULE(300 MG) BY MOUTH TWICE DAILY 10/12/22   Cristie Hem, PA-C  ipratropium-albuterol (DUONEB) 0.5-2.5 (3) MG/3ML SOLN Take 3 mLs by nebulization every 6 hours as needed. 12/20/21   Arrien, York Ram, MD  MAGNESIUM OXIDE PO Take 133 mg by mouth 3 (three) times daily. Mg Plus Protein 133 mg tablet    [provider]  Menaquinone-7 (VITAMIN K2) 100 MCG CAPS Take by mouth.    [provider]  Menatetrenone (VITAMIN K2) 100 MCG TABS Take 100 mcg by mouth daily. 06/17/22   [provider]  methocarbamol (ROBAXIN) 500 MG tablet TAKE 1 TABLET(500 MG) BY MOUTH THREE TIMES DAILY 08/24/22   Adonis Huguenin, NP  metoCLOPramide (REGLAN) 10 MG tablet Take 10 mg by mouth 4 (four) times daily.    [provider]  Multiple Vitamin (MULTI-VITAMIN) tablet Take 1 tablet by mouth daily. 06/20/23   [provider]  Multiple Vitamins-Calcium (CALCI-MAX) CAPS Taking twice daily 06/28/22   [provider]  nicotine (NICODERM CQ - DOSED IN MG/24 HOURS) 14 mg/24hr patch Place onto the skin. 09/28/22   [provider]  ondansetron (ZOFRAN) 8 MG tablet Take by mouth. 12/06/22   [provider]  ondansetron (ZOFRAN) 8 MG tablet Take 1 tablet by mouth every 8 (eight) hours as needed. 06/20/23 09/03/23  [provider]  Pancrelipase, Lip-Prot-Amyl, (ZENPEP) 40000-126000 units CPEP Take 2 capsules before meals and 1  capsule with snacks 12/21/22   Doree Albee, PA-C  pantoprazole (PROTONIX) 40 MG tablet Take 40 mg by mouth 2 (two) times daily. 06/23/23 06/22/24  [provider]  promethazine (PHENERGAN) 25 MG tablet Take 1 tablet (25 mg total) by mouth every 6 (six) hours as needed for refractory nausea / vomiting. 04/20/21   Wieters, Hallie C, PA-C  rOPINIRole (REQUIP) 0.5 MG tablet  06/21/22   [provider]  Specialty Vitamins Products (MG PLUS PROTEIN) 133 MG TABS Take 133 mg by mouth 3 (three) times daily. 06/20/23   [provider]  SPIRIVA RESPIMAT 2.5 MCG/ACT AERS INHALE 2 PUFFS INTO THE LUNGS DAILY 04/14/23   Glenford Bayley, NP  sucralfate (CARAFATE) 1 g tablet Take 1 tablet (1 g total) by mouth 4 (four) times daily -  with meals and at bedtime. 12/21/22  Quentin Mulling R, PA-C  tacrolimus (PROGRAF) 1 MG capsule Take 4 mg by mouth 2 (two) times daily. 08/21/23   [provider]  traMADol (ULTRAM) 50 MG tablet Take 50 mg by mouth every 6 (six) hours as needed. 06/26/23 08/25/23  [provider]  traZODone (DESYREL) 100 MG tablet Take 200 mg by mouth at bedtime. 06/20/23   [provider]  traZODone (DESYREL) 150 MG tablet Take 1 tablet (150 mg total) by mouth at bedtime. For sleep Patient taking differently: Take 400 mg by mouth at bedtime. For sleep 11/08/14   Rankin, Shuvon B, NP  ursodiol (ACTIGALL) 300 MG capsule Take 300 mg by mouth 3 (three) times daily. 06/20/23 08/30/23  [provider]  Venlafaxine HCl 225 MG TB24 Take 1 tablet by mouth daily. 07/04/20   [provider]  Venlafaxine HCl 225 MG TB24 Take 1 tablet by mouth daily. 07/04/20   [provider]      Allergies    Other, Tape, and Nicotine    Review of Systems   Review of Systems  Physical Exam Updated Vital Signs BP (!) 124/91   Pulse 99   Temp 97.8 F (36.6 C)   Resp 16   LMP  (LMP Unknown)   SpO2 95%  Physical Exam Vitals and nursing note reviewed. Exam  conducted with a chaperone present.  Constitutional:      General: She is not in acute distress.    Appearance: She is well-developed.  HENT:     Head: Normocephalic and atraumatic.  Eyes:     Conjunctiva/sclera: Conjunctivae normal.  Cardiovascular:     Rate and Rhythm: Normal rate and regular rhythm.     Heart sounds: No murmur heard. Pulmonary:     Effort: Pulmonary effort is normal. No respiratory distress.     Breath sounds: Normal breath sounds.  Abdominal:     Palpations: Abdomen is soft.     Tenderness: There is no abdominal tenderness.  Genitourinary:      Comments: Patient with 3.1 cm in diameter area of palpable fluctuance in area as above.  No pain during DRE without obvious bleeding, discharge present.  Area of palpable fluctuance tender to palpation.  No obvious extending erythema, induration. Musculoskeletal:        General: No swelling.     Cervical back: Neck supple.  Skin:    General: Skin is warm and dry.     Capillary Refill: Capillary refill takes less than 2 seconds.  Neurological:     Mental Status: She is alert.  Psychiatric:        Mood and Affect: Mood normal.     ED Results / Procedures / Treatments   Labs (all labs ordered are listed, but only abnormal results are displayed) Labs Reviewed - No data to display  EKG None  Radiology No results found.  Procedures .Marland KitchenIncision and Drainage  Date/Time: 08/25/2023 1:44 PM  Performed by: Peter Garter, PA Authorized by: Peter Garter, PA   Consent:    Consent obtained:  Verbal   Consent given by:  Patient   Risks, benefits, and alternatives were discussed: yes     Risks discussed:  Damage to other organs and bleeding   Alternatives discussed:  No treatment, delayed treatment and alternative treatment Universal protocol:    Procedure explained and questions answered to patient or proxy's satisfaction: yes     Patient identity confirmed:  Verbally with patient Location:    Type:  Abscess   Size:  3.1   Location:  Anogenital   Anogenital location:  Perirectal Pre-procedure details:    Skin preparation:  Povidone-iodine Sedation:    Sedation type:  None Anesthesia:    Anesthesia method:  Local infiltration   Local anesthetic:  Lidocaine 2% WITH epi Procedure type:    Complexity:  Simple Procedure details:    Ultrasound guidance: yes     Needle aspiration: no     Incision types:  Single straight   Wound management:  Probed and deloculated and irrigated with saline   Drainage:  Purulent and bloody   Drainage amount:  Moderate   Wound treatment:  Wound left open   Packing materials:  None Post-procedure details:    Procedure completion:  Tolerated well, no immediate complications     Medications Ordered in ED Medications  lidocaine-EPINEPHrine (XYLOCAINE W/EPI) 2 %-1:200000 (PF) injection 10 mL (has no administration in time range)    ED Course/ Medical Decision Making/ A&P                                 Medical Decision Making Risk Prescription drug management.   This patient presents to the ED for concern of abscess, this involves an extensive number of treatment options, and is a complaint that carries with it a high risk of complications and morbidity.  The differential diagnosis includes abscess, pilonidal cyst, Fournier gangrene, cellulitis, erysipelas, other   Co morbidities that complicate the patient evaluation  See HPI   Additional history obtained:  Additional history obtained from EMR External records from outside source obtained and reviewed including hospital records   Lab Tests:  N/a   Imaging Studies ordered:  N/a   Cardiac Monitoring: / EKG:  The patient was maintained on a cardiac monitor.  I personally viewed and interpreted the cardiac monitored which showed an underlying rhythm of: Sinus rhythm   Consultations Obtained:  N/a   Problem List / ED Course / Critical interventions / Medication  management  Abscess I ordered medication including lidocaine with epinephrine  Reevaluation of the patient after these medicines showed that the patient improved I have reviewed the patients home medicines and have made adjustments as needed   Social Determinants of Health:  Cigarette use.  Denies illicit drug use.   Test / Admission - Considered:  Abscess Vitals signs within normal range and stable throughout visit. 52 year old female presents emergency department with complaints of abscess.  Abscess present since Wednesday.  On exam, patient with area of palpable fluctuance in perirectal region as above without obvious communication to the rectum and without pain during DRE.  Area drained in manner as above.  Will place patient on empiric antibiotic coverage in the form of Bactrim.  Will recommend follow-up with general surgery/PCP in the outpatient setting for reassessment.  Treatment plan discussed at length with patient and she acknowledged understanding was agreeable to said plan.  Patient overall well-appearing, afebrile in no acute distress. Worrisome signs and symptoms were discussed with the patient, and the patient acknowledged understanding to return to the ED if noticed. Patient was stable upon discharge.          Final Clinical Impression(s) / ED Diagnoses Final diagnoses:  Abscess    Rx / DC Orders ED Discharge Orders          Ordered    Diet NPO time specified  Status:  Canceled  08/25/23 1350    sulfamethoxazole-trimethoprim (BACTRIM DS) 800-160 MG tablet  2 times daily        08/25/23 1440              Peter Garter, [place] 08/25/23 1444    Maia Plan, MD 08/25/23 [phone]

## 2023-08-25 NOTE — ED Notes (Signed)
Patient is being discharged from the Urgent Care and sent to the Emergency Department via POV . Per HM, patient is in need of higher level of care due to peri rectal abscess. Patient is aware and verbalizes understanding of plan of care.  Vitals:   08/25/23 1038  BP: 107/65  Pulse: 87  Resp: 18  Temp: 98.8 F (37.1 C)  SpO2: 96%

## 2023-08-25 NOTE — Discharge Instructions (Signed)
As discussed, recommend keeping area clean and dry.  Wash area gently with warm soapy water when you are in the shower.  Recommend carrying nonscented baby wipes with you to wipe area whenever you use the restroom during the day.  Will place on antibiotics given area being prone to infection.  Attached is information for general surgery to follow-up with if this becomes a recurrent problem.  Please not hesitate to return to the emergency department for worrisome signs and symptoms we discussed become apparent.

## 2023-08-25 NOTE — ED Provider Notes (Signed)
[name]    CSN: [id number] Arrival date & time: 08/25/23  4034      History   Chief Complaint Chief Complaint  Patient presents with   Skin Problem    HPI Barbara Stanley is a 52 y.o. female.   Patient presents today with concerns of abscess around her rectum that she noticed about 3 days ago.  Denies previous history of the same.  Denies any drainage from the area.  Denies any fever.  Patient is a stem cell transplant patient.     Past Medical History:  Diagnosis Date   Abdominal pain    Anemia    Anxiety    Bronchitis    COPD (chronic obstructive pulmonary disease) (HCC)    Depression    bipolar   Diarrhea    Emphysema    GERD (gastroesophageal reflux disease)    History of blood transfusion    MDS (myelodysplastic syndrome) (HCC)    Splenomegaly    r/t low platelets   Thrombocytopenia (HCC)    Tobacco abuse     Patient Active Problem List   Diagnosis Date Noted   Pain of upper abdomen 07/04/2023   History of allogeneic stem cell transplant (HCC) 06/15/2023   Myelodysplasia (myelodysplastic syndrome) (HCC) 05/25/2023   Physical deconditioning 04/28/2023   Chronic respiratory failure with hypoxia (HCC) 01/20/2023   Loss of equilibrium 08/29/2022   Nonintractable headache 08/29/2022   Age-related osteoporosis with current pathological fracture with routine healing 08/23/2022   Senile osteoporosis 06/15/2022   History of acute respiratory failure 01/03/2022   Acute exacerbation of chronic obstructive pulmonary disease (COPD) (HCC) 12/17/2021   Malnutrition, unspecified type (HCC) 04/01/2021   Former smoker 02/09/2021   Healthcare maintenance 10/12/2020   COPD (chronic obstructive pulmonary disease) with chronic bronchitis (HCC) 08/13/2020   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   Tobacco dependence due to cigarettes 08/13/2020   Insomnia 08/09/2020   Iron deficiency anemia 11/12/2019   Chronic diarrhea 10/18/2019    Splenomegaly, congestive, chronic 09/05/2019   Chronic ITP (idiopathic thrombocytopenia) (HCC) 08/28/2019   Urinary incontinence, mixed 08/13/2019   MDS (myelodysplastic syndrome), low grade (HCC) 07/26/2019   Vitamin D deficiency 10/08/2018   Vitamin B12 deficiency due to intestinal malabsorption 10/08/2018   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/27/2018   Dietary copper deficiency 09/27/2018   Other pancytopenia (HCC) 09/22/2018   Thrombocytopenia (HCC) 08/08/2018   Arthritis, multiple joint involvement 11/28/2016   COPD without exacerbation (HCC) 01/13/2015   MDD (major depressive disorder), single episode, 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/2014   PTSD (post-traumatic stress disorder) 11/05/2014   Depressive disorder 06/04/2014   Depression with anxiety 06/04/2014   Major depression, recurrent (HCC) 03/21/2014   Suicide attempt (HCC) 03/21/2014   Suicidal ideations 03/21/2014   MDD (major depressive disorder) 03/21/2014   Nonspecific abnormal results of cardiovascular function study 09/30/2013   Tobacco use disorder 09/30/2013   COPD (chronic obstructive pulmonary disease) (HCC)    GERD (gastroesophageal reflux disease)    Anemia    Dumping syndrome 05/17/2012   FH: diabetes mellitus 05/17/2012   Acquired absence of both cervix and uterus 11/15/2011   Bariatric surgery status 09/06/2011   Leiomyoma 09/06/2011   Ovarian cyst 09/06/2011    Past Surgical History:  Procedure Laterality Date   ABDOMINAL HYSTERECTOMY     BONE MARROW TRANSPLANT     c section x 3      CHOLECYSTECTOMY     GASTRIC BYPASS     UPPER GASTROINTESTINAL ENDOSCOPY  WISDOM TOOTH EXTRACTION      OB History     Gravida  4   Para      Term      Preterm      AB      Living  3      SAB      IAB      Ectopic      Multiple      Live Births               Home Medications    Prior to Admission medications   Medication Sig Start Date End Date Taking? Authorizing Provider  acyclovir (ZOVIRAX)  400 MG tablet Take 400 mg by mouth. 07/22/22  Yes [provider]  acyclovir (ZOVIRAX) 800 MG tablet Take 800 mg by mouth 2 (two) times daily. 06/20/23  Yes [provider]  ADVAIR HFA 230-21 MCG/ACT inhaler INHALE 2 PUFFS INTO THE LUNGS TWICE DAILY. RINSE MOUTH WITH WATER AFTER USE 05/23/23  Yes Glenford Bayley, NP  albuterol (VENTOLIN HFA) 108 (90 Base) MCG/ACT inhaler Inhale 1 - 2 puffs into the lungs every 6  hours as needed for wheezing or shortness of breath. 12/20/21  Yes Arrien, York Ram, MD  albuterol (VENTOLIN HFA) 108 (90 Base) MCG/ACT inhaler Inhale 2 puffs into the lungs every 4 (four) hours as needed for wheezing or shortness of breath. 02/09/21  Yes [provider]  alteplase (CATHFLO ACTIVASE) 2 MG injection 2 mg. 12/06/22  Yes [provider]  ascorbic acid (VITAMIN C) 500 MG tablet Take 500 mg by mouth 2 (two) times daily. 11/15/22  Yes [provider]  busPIRone (BUSPAR) 15 MG tablet Take 1 tablet by mouth at bedtime. 06/20/23  Yes [provider]  busPIRone (BUSPAR) 5 MG tablet TAKE 1 TABLET(5 MG) BY MOUTH THREE TIMES DAILY Patient taking differently: Take 15 mg by mouth at bedtime. 08/17/20  Yes Barbette Merino, NP  calcium carbonate (OS-CAL) 600 MG tablet Take 600 mg by mouth. 06/17/22  Yes [provider]  colestipol (COLESTID) 1 g tablet Take 1 g by mouth as directed. 06/20/23  Yes [provider]  dapsone 100 MG tablet Take 1 tablet by mouth daily. 06/29/23 12/26/23 Yes [provider]  famotidine (PEPCID) 20 MG tablet Take 1 tablet (20 mg total) by mouth 2 (two) times daily. 07/27/22  Yes Tressia Danas, MD  ferrous sulfate 325 (65 FE) MG EC tablet Take 325 mg by mouth 2 (two) times daily. 11/15/22  Yes [provider]  fluconazole (DIFLUCAN) 200 MG tablet Take 400 mg by mouth daily. 06/21/23  Yes [provider]  folic acid (FOLVITE) 1 MG tablet TAKE 1 TABLET(1 MG) BY MOUTH  DAILY Patient taking differently: Take 1 mg by mouth daily. 03/26/20  Yes Gorsuch, Ni, MD  folic acid (FOLVITE) 1 MG tablet Take 1 tablet by mouth daily. 06/20/23 06/19/24 Yes [provider]  MAGNESIUM OXIDE PO Take 133 mg by mouth 3 (three) times daily. Mg Plus Protein 133 mg tablet   Yes [provider]  Menatetrenone (VITAMIN K2) 100 MCG TABS Take 100 mcg by mouth daily. 06/17/22  Yes [provider]  metoCLOPramide (REGLAN) 10 MG tablet Take 10 mg by mouth 4 (four) times daily.   Yes [provider]  Multiple Vitamin (MULTI-VITAMIN) tablet Take 1 tablet by mouth daily. 06/20/23  Yes [provider]  Multiple Vitamins-Calcium (CALCI-MAX) CAPS Taking twice daily 06/28/22  Yes [provider]  ondansetron (ZOFRAN) 8 MG tablet Take 1 tablet by mouth every 8 (eight) hours as needed. 06/20/23 09/03/23 Yes [provider]  pantoprazole (PROTONIX) 40 MG tablet Take 40 mg by mouth 2 (two) times daily. 06/23/23 06/22/24 Yes [provider]  penicillin v potassium (VEETID) 500 MG tablet Take 500 mg by mouth 2 (two) times daily. 06/20/23 10/18/23 Yes [provider]  Specialty Vitamins Products (MG PLUS PROTEIN) 133 MG TABS Take 133 mg by mouth 3 (three) times daily. 06/20/23  Yes [provider]  SPIRIVA RESPIMAT 2.5 MCG/ACT AERS INHALE 2 PUFFS INTO THE LUNGS DAILY 04/14/23  Yes Glenford Bayley, NP  tacrolimus (PROGRAF) 1 MG capsule Take 4 mg by mouth 2 (two) times daily. 08/21/23  Yes [provider]  traMADol (ULTRAM) 50 MG tablet Take 50 mg by mouth every 6 (six) hours as needed. 06/26/23 08/25/23 Yes [provider]  traZODone (DESYREL) 100 MG tablet Take 200 mg by mouth at bedtime. 06/20/23  Yes [provider]  ursodiol (ACTIGALL) 300 MG capsule Take 300 mg by mouth 3 (three) times daily. 06/20/23 08/30/23 Yes [provider]  Venlafaxine HCl 225 MG TB24 Take 1 tablet by mouth daily. 07/04/20  Yes  [provider]  Venlafaxine HCl 225 MG TB24 Take 1 tablet by mouth daily. 07/04/20  Yes [provider]  acetaminophen (TYLENOL) 500 MG tablet Take 1,000 mg by mouth daily as needed for moderate pain.    [provider]  azaCITIDine (VIDAZA) 100 MG SUSR Inject 160 mg/m2 into the skin once.    [provider]  cholecalciferol (VITAMIN D3) 25 MCG (1000 UT) tablet Take 5,000 Units by mouth daily.    [provider]  colestipol (COLESTID) 1 g tablet Take 2 tablets (2 g total) by mouth 3 (three) times daily. 12/21/22 03/21/23  Doree Albee, PA-C  Copper Gluconate 2 MG CAPS Take 4 mg by mouth daily.    [provider]  diclofenac sodium (VOLTAREN) 1 % GEL Apply 2 g topically 4 (four) times daily. Patient taking differently: Apply 2 g topically daily as needed (for pain). 07/24/19   Kallie Locks, FNP  dicyclomine (BENTYL) 20 MG tablet Take 1 tablet (20 mg total) by mouth in the morning, at noon, in the evening, and at bedtime. Patient taking differently: Take 20 mg by mouth 4 (four) times daily as needed for spasms. 02/24/21   Tressia Danas, MD  esomeprazole (NEXIUM) 40 MG capsule Take 1 capsule (40 mg total) by mouth 2 (two) times daily. 07/27/22   Tressia Danas, MD  gabapentin (NEURONTIN) 300 MG capsule TAKE 1 CAPSULE(300 MG) BY MOUTH TWICE DAILY 10/12/22   Cristie Hem, PA-C  ipratropium-albuterol (DUONEB) 0.5-2.5 (3) MG/3ML SOLN Take 3 mLs by nebulization every 6 hours as needed. 12/20/21   Arrien, York Ram, MD  Menaquinone-7 (VITAMIN K2) 100 MCG CAPS Take by mouth.    [provider]  methocarbamol (ROBAXIN) 500 MG tablet TAKE 1 TABLET(500 MG) BY MOUTH THREE TIMES DAILY 08/24/22   Adonis Huguenin, NP  nicotine (NICODERM CQ - DOSED IN MG/24 HOURS) 14 mg/24hr patch Place onto the skin. 09/28/22   [provider]  ondansetron (ZOFRAN) 8 MG tablet Take by mouth. 12/06/22   [provider]  Pancrelipase,  Lip-Prot-Amyl, (ZENPEP) 40000-126000 units CPEP Take 2 capsules before meals and 1 capsule with snacks 12/21/22   Doree Albee, PA-C  promethazine (PHENERGAN) 25 MG tablet Take 1 tablet (25 mg total)  by mouth every 6 (six) hours as needed for refractory nausea / vomiting. 04/20/21   Wieters, Hallie C, PA-C  rOPINIRole (REQUIP) 0.5 MG tablet  06/21/22   [provider]  sucralfate (CARAFATE) 1 g tablet Take 1 tablet (1 g total) by mouth 4 (four) times daily -  with meals and at bedtime. 12/21/22   Doree Albee, PA-C  traZODone (DESYREL) 150 MG tablet Take 1 tablet (150 mg total) by mouth at bedtime. For sleep Patient taking differently: Take 400 mg by mouth at bedtime. For sleep 11/08/14   Rankin, Shuvon B, NP    Family History Family History  Problem Relation Age of Onset   Diabetes Mother    Heart disease Father    Diabetes Other    Hypertension Other    CAD Other    Colon cancer Neg Hx    Rectal cancer Neg Hx    Stomach cancer Neg Hx    Esophageal cancer Neg Hx     Social History Social History   Tobacco Use   Smoking status: Some Days    Current packs/day: 0.00    Average packs/day: 0.3 packs/day for 30.0 years (7.5 ttl pk-yrs)    Types: Cigarettes    Start date: 02/01/1991    Last attempt to quit: 01/31/2021    Years since quitting: 2.5   Smokeless tobacco: Never   Tobacco comments:    5 cigs a day  Vaping Use   Vaping status: Never Used  Substance Use Topics   Alcohol use: Not Currently    Comment: occasional wine   Drug use: No     Allergies   Other, Tape, and Nicotine   Review of Systems Review of Systems Per HPI  Physical Exam Triage Vital Signs ED Triage Vitals  Encounter Vitals Group     BP 08/25/23 1038 107/65     Systolic BP Percentile --      Diastolic BP Percentile --      Pulse Rate 08/25/23 1038 87     Resp 08/25/23 1038 18     Temp 08/25/23 1038 98.8 F (37.1 C)     Temp Source 08/25/23 1038 Oral     SpO2 08/25/23 1038 96 %      Weight 08/25/23 1029 165 lb (74.8 kg)     Height 08/25/23 1029 5\' 6"  (1.676 m)     Head Circumference --      Peak Flow --      Pain Score 08/25/23 1027 10     Pain Loc --      Pain Education --      Exclude from Growth Chart --    No data found.  Updated Vital Signs BP 107/65 (BP Location: Left Arm)   Pulse 87   Temp 98.8 F (37.1 C) (Oral)   Resp 18   Ht 5\' 6"  (1.676 m)   Wt 165 lb (74.8 kg)   LMP  (LMP Unknown)   SpO2 96%   BMI 26.63 kg/m   Visual Acuity Right Eye Distance:   Left Eye Distance:   Bilateral Distance:    Right Eye Near:   Left Eye Near:    Bilateral Near:     Physical Exam Exam conducted with a chaperone present.  Constitutional:      General: She is not in acute distress.    Appearance: Normal appearance. She is not toxic-appearing or diaphoretic.  HENT:     Head: Normocephalic and atraumatic.  Eyes:  Extraocular Movements: Extraocular movements intact.     Conjunctiva/sclera: Conjunctivae normal.  Pulmonary:     Effort: Pulmonary effort is normal.  Genitourinary:      Comments: Patient has area of fluctuance that is erythematous that is approximately 1.5 to 2 cm in diameter present to upper area surrounding rectum.  No drainage noted. Neurological:     General: No focal deficit present.     Mental Status: She is alert and oriented to person, place, and time. Mental status is at baseline.  Psychiatric:        Mood and Affect: Mood normal.        Behavior: Behavior normal.        Thought Content: Thought content normal.        Judgment: Judgment normal.      UC Treatments / Results  Labs (all labs ordered are listed, but only abnormal results are displayed) Labs Reviewed - No data to display  EKG   Radiology No results found.  Procedures Procedures (including critical care time)  Medications Ordered in UC Medications - No data to display  Initial Impression / Assessment and Plan / UC Course  I have reviewed the  triage vital signs and the nursing notes.  Pertinent labs & imaging results that were available during my care of the patient were reviewed by me and considered in my medical decision making (see chart for details).     Differential diagnoses include perirectal abscess versus external hemorrhoid.  It looks more suspicious for perirectal abscess which needs to be drained.  Patient is in a severe amount of pain and has low immune system.  Given area of location of abscess, this is not safe to drain in urgent care so the patient was advised to go to the ER for further evaluation and management.  She was agreeable with plan.  Vital signs stable at discharge.  Agree with patient self transport to the ER. Final Clinical Impressions(s) / UC Diagnoses   Final diagnoses:  Perirectal abscess     Discharge Instructions      Please go to the emergency department as soon as you leave urgent care for further evaluation and management.    ED Prescriptions   None    PDMP not reviewed this encounter.   Gustavus Bryant, [place] 08/25/23 1243

## 2023-08-25 NOTE — Discharge Instructions (Signed)
Please go to the emergency department as soon as you leave urgent care for further evaluation and management. ?

## 2023-08-25 NOTE — ED Triage Notes (Signed)
"  I am a transplant patient and provider wants me seen and I think I have an abscess on my anus". First noticed problem "Wednesday" night. No fever.

## 2023-08-25 NOTE — ED Triage Notes (Signed)
Pt c/o perianal cyst, first noticed Wednesday night. Denies fever, drainage, additional issue. States UC sent her for potential I&D- "they weren't comfortable draining it because I'm a transplant patient so they sent me here."

## 2023-09-11 ENCOUNTER — Other Ambulatory Visit: Payer: Self-pay | Admitting: Acute Care

## 2023-09-11 ENCOUNTER — Telehealth: Payer: Self-pay | Admitting: Family Medicine

## 2023-09-11 DIAGNOSIS — F17210 Nicotine dependence, cigarettes, uncomplicated: Secondary | ICD-10-CM

## 2023-09-11 DIAGNOSIS — Z87891 Personal history of nicotine dependence: Secondary | ICD-10-CM

## 2023-09-11 NOTE — Telephone Encounter (Signed)
Patient dropped off document  CMN, MEO, and notes requested , to be filled out by provider. Patient requested to send it back via Fax within 5-days. Document is located in providers tray at front office.Please fax at  [phone]

## 2023-09-11 NOTE — Telephone Encounter (Signed)
Provider has papers at her desk. Awaiting signature

## 2023-09-27 ENCOUNTER — Ambulatory Visit (INDEPENDENT_AMBULATORY_CARE_PROVIDER_SITE_OTHER): Payer: MEDICAID | Admitting: Family Medicine

## 2023-09-27 ENCOUNTER — Encounter: Payer: Self-pay | Admitting: Family Medicine

## 2023-09-27 VITALS — BP 111/70 | HR 82 | Temp 98.9°F | Resp 18 | Ht 66.0 in | Wt 162.6 lb

## 2023-09-27 DIAGNOSIS — Z130 Encounter for screening for diseases of the blood and blood-forming organs and certain disorders involving the immune mechanism: Secondary | ICD-10-CM | POA: Diagnosis not present

## 2023-09-27 DIAGNOSIS — Z13220 Encounter for screening for lipoid disorders: Secondary | ICD-10-CM

## 2023-09-27 DIAGNOSIS — Z0000 Encounter for general adult medical examination without abnormal findings: Secondary | ICD-10-CM | POA: Diagnosis not present

## 2023-09-27 DIAGNOSIS — Z1329 Encounter for screening for other suspected endocrine disorder: Secondary | ICD-10-CM

## 2023-09-27 DIAGNOSIS — Z1231 Encounter for screening mammogram for malignant neoplasm of breast: Secondary | ICD-10-CM

## 2023-09-27 DIAGNOSIS — Z13228 Encounter for screening for other metabolic disorders: Secondary | ICD-10-CM

## 2023-09-28 ENCOUNTER — Other Ambulatory Visit: Payer: Self-pay | Admitting: Family Medicine

## 2023-09-28 DIAGNOSIS — D696 Thrombocytopenia, unspecified: Secondary | ICD-10-CM

## 2023-09-28 DIAGNOSIS — D649 Anemia, unspecified: Secondary | ICD-10-CM

## 2023-09-28 LAB — HEMOGLOBIN A1C
Est. average glucose Bld gHb Est-mCnc: 85 mg/dL
Hgb A1c MFr Bld: 4.6 % — ABNORMAL LOW (ref 4.8–5.6)

## 2023-09-28 LAB — CMP14+EGFR
ALT: 24 [IU]/L (ref 0–32)
AST: 24 [IU]/L (ref 0–40)
Albumin: 4 g/dL (ref 3.8–4.9)
Alkaline Phosphatase: 65 [IU]/L (ref 44–121)
BUN/Creatinine Ratio: 11 (ref 9–23)
BUN: 8 mg/dL (ref 6–24)
Bilirubin Total: 0.8 mg/dL (ref 0.0–1.2)
CO2: 22 mmol/L (ref 20–29)
Calcium: 8.5 mg/dL — ABNORMAL LOW (ref 8.7–10.2)
Chloride: 109 mmol/L — ABNORMAL HIGH (ref 96–106)
Creatinine, Ser: 0.75 mg/dL (ref 0.57–1.00)
Globulin, Total: 1.4 g/dL — ABNORMAL LOW (ref 1.5–4.5)
Glucose: 74 mg/dL (ref 70–99)
Potassium: 4.2 mmol/L (ref 3.5–5.2)
Sodium: 143 mmol/L (ref 134–144)
Total Protein: 5.4 g/dL — ABNORMAL LOW (ref 6.0–8.5)
eGFR: 96 mL/min/{1.73_m2} (ref >59)

## 2023-09-28 LAB — CBC WITH DIFFERENTIAL/PLATELET
Basophils Absolute: 0 10*3/uL (ref 0.0–0.2)
Basos: 1 %
EOS (ABSOLUTE): 0.1 10*3/uL (ref 0.0–0.4)
Eos: 2 %
Hematocrit: 26.6 % — ABNORMAL LOW (ref 34.0–46.6)
Hemoglobin: 8.8 g/dL — ABNORMAL LOW (ref 11.1–15.9)
Immature Grans (Abs): 0 10*3/uL (ref 0.0–0.1)
Immature Granulocytes: 1 %
Lymphocytes Absolute: 0.4 10*3/uL — ABNORMAL LOW (ref 0.7–3.1)
Lymphs: 14 %
MCH: 32.2 pg (ref 26.6–33.0)
MCHC: 33.1 g/dL (ref 31.5–35.7)
MCV: 97 fL (ref 79–97)
Monocytes Absolute: 0.3 10*3/uL (ref 0.1–0.9)
Monocytes: 9 %
NRBC: 4 % — ABNORMAL HIGH (ref 0–0)
Neutrophils Absolute: 2.2 10*3/uL (ref 1.4–7.0)
Neutrophils: 73 %
Platelets: 43 10*3/uL — CL (ref 150–450)
RBC: 2.73 x10E6/uL — CL (ref 3.77–5.28)
RDW: 13.5 % (ref 11.7–15.4)
WBC: 3 10*3/uL — ABNORMAL LOW (ref 3.4–10.8)

## 2023-09-28 LAB — LIPID PANEL
Chol/HDL Ratio: 2.1 {ratio} (ref 0.0–4.4)
Cholesterol, Total: 96 mg/dL — ABNORMAL LOW (ref 100–199)
HDL: 45 mg/dL (ref >39)
LDL Chol Calc (NIH): 36 mg/dL (ref 0–99)
Triglycerides: 66 mg/dL (ref 0–149)
VLDL Cholesterol Cal: 15 mg/dL (ref 5–40)

## 2023-09-28 LAB — TSH: TSH: 1.15 u[IU]/mL (ref 0.450–4.500)

## 2023-09-28 LAB — VITAMIN D 25 HYDROXY (VIT D DEFICIENCY, FRACTURES): Vit D, 25-Hydroxy: 25.8 ng/mL — ABNORMAL LOW (ref 30.0–100.0)

## 2023-09-28 MED ORDER — VITAMIN D (ERGOCALCIFEROL) 1.25 MG (50000 UNIT) PO CAPS: 50000.0000 [IU] | ORAL_CAPSULE | ORAL | 0 refills | Status: AC

## 2023-09-28 NOTE — Progress Notes (Signed)
I called patient and made her aware of results are consistent with vitamin D deficiency. Recommend weekly supplementation for 3 months and then recheck labs. Also CBC with very low platelets and hemoglobin. Needs urgent visit to Heme -onc for further eval/mg. To ED if bleeding occurs.  Patient stated that she has a appointment on Monday and that her numbers are good compared to what they were.

## 2023-09-29 ENCOUNTER — Encounter: Payer: Self-pay | Admitting: Family Medicine

## 2023-09-29 NOTE — Progress Notes (Signed)
Established Patient Office Visit  Subjective    Patient ID: Barbara Stanley, female    DOB: August 26, 1971  Age: 52 y.o. MRN: [id number]  CC:  Chief Complaint  Patient presents with   Annual Exam    HPI Barbara Stanley presents for routine annual exam. Patient denies acute complaints or concerns.   Outpatient Encounter Medications as of 09/27/2023  Medication Sig   acetaminophen (TYLENOL) 500 MG tablet Take 1,000 mg by mouth daily as needed for moderate pain.   acyclovir (ZOVIRAX) 400 MG tablet Take 400 mg by mouth.   acyclovir (ZOVIRAX) 800 MG tablet Take 800 mg by mouth 2 (two) times daily.   albuterol (VENTOLIN HFA) 108 (90 Base) MCG/ACT inhaler Inhale 1 - 2 puffs into the lungs every 6  hours as needed for wheezing or shortness of breath.   albuterol (VENTOLIN HFA) 108 (90 Base) MCG/ACT inhaler Inhale 2 puffs into the lungs every 4 (four) hours as needed for wheezing or shortness of breath.   alteplase (CATHFLO ACTIVASE) 2 MG injection 2 mg.   ascorbic acid (VITAMIN C) 500 MG tablet Take 500 mg by mouth 2 (two) times daily.   azaCITIDine (VIDAZA) 100 MG SUSR Inject 160 mg/m2 into the skin once.   busPIRone (BUSPAR) 15 MG tablet Take 1 tablet by mouth at bedtime.   busPIRone (BUSPAR) 5 MG tablet TAKE 1 TABLET(5 MG) BY MOUTH THREE TIMES DAILY (Patient taking differently: Take 15 mg by mouth at bedtime.)   calcium carbonate (OS-CAL) 600 MG tablet Take 600 mg by mouth.   cholecalciferol (VITAMIN D3) 25 MCG (1000 UT) tablet Take 5,000 Units by mouth daily.   colestipol (COLESTID) 1 g tablet Take 1 g by mouth as directed.   Copper Gluconate 2 MG CAPS Take 4 mg by mouth daily.   dapsone 100 MG tablet Take 1 tablet by mouth daily.   diclofenac sodium (VOLTAREN) 1 % GEL Apply 2 g topically 4 (four) times daily. (Patient taking differently: Apply 2 g topically daily as needed (for pain).)   dicyclomine (BENTYL) 20 MG tablet Take 1 tablet (20 mg total) by mouth in the morning, at noon, in the  evening, and at bedtime. (Patient taking differently: Take 20 mg by mouth 4 (four) times daily as needed for spasms.)   esomeprazole (NEXIUM) 40 MG capsule Take 1 capsule (40 mg total) by mouth 2 (two) times daily.   famotidine (PEPCID) 20 MG tablet Take 1 tablet (20 mg total) by mouth 2 (two) times daily.   ferrous sulfate 325 (65 FE) MG EC tablet Take 325 mg by mouth 2 (two) times daily.   fluconazole (DIFLUCAN) 200 MG tablet Take 400 mg by mouth daily.   folic acid (FOLVITE) 1 MG tablet TAKE 1 TABLET(1 MG) BY MOUTH DAILY (Patient taking differently: Take 1 mg by mouth daily.)   folic acid (FOLVITE) 1 MG tablet Take 1 tablet by mouth daily.   gabapentin (NEURONTIN) 300 MG capsule TAKE 1 CAPSULE(300 MG) BY MOUTH TWICE DAILY   ipratropium-albuterol (DUONEB) 0.5-2.5 (3) MG/3ML SOLN Take 3 mLs by nebulization every 6 hours as needed.   MAGNESIUM OXIDE PO Take 133 mg by mouth 3 (three) times daily. Mg Plus Protein 133 mg tablet   Menaquinone-7 (VITAMIN K2) 100 MCG CAPS Take by mouth.   Menatetrenone (VITAMIN K2) 100 MCG TABS Take 100 mcg by mouth daily.   methocarbamol (ROBAXIN) 500 MG tablet TAKE 1 TABLET(500 MG) BY MOUTH THREE TIMES DAILY   metoCLOPramide (REGLAN) 10 MG  tablet Take 10 mg by mouth 4 (four) times daily.   Multiple Vitamin (MULTI-VITAMIN) tablet Take 1 tablet by mouth daily.   Multiple Vitamins-Calcium (CALCI-MAX) CAPS Taking twice daily   ondansetron (ZOFRAN) 8 MG tablet Take by mouth.   Pancrelipase, Lip-Prot-Amyl, (ZENPEP) 40000-126000 units CPEP Take 2 capsules before meals and 1 capsule with snacks   pantoprazole (PROTONIX) 40 MG tablet Take 40 mg by mouth 2 (two) times daily.   promethazine (PHENERGAN) 25 MG tablet Take 1 tablet (25 mg total) by mouth every 6 (six) hours as needed for refractory nausea / vomiting.   Specialty Vitamins Products (MG PLUS PROTEIN) 133 MG TABS Take 133 mg by mouth 3 (three) times daily.   SPIRIVA RESPIMAT 2.5 MCG/ACT AERS INHALE 2 PUFFS INTO THE  LUNGS DAILY   sucralfate (CARAFATE) 1 g tablet Take 1 tablet (1 g total) by mouth 4 (four) times daily -  with meals and at bedtime.   tacrolimus (PROGRAF) 1 MG capsule Take 4 mg by mouth 2 (two) times daily.   traZODone (DESYREL) 100 MG tablet Take 200 mg by mouth at bedtime.   traZODone (DESYREL) 150 MG tablet Take 1 tablet (150 mg total) by mouth at bedtime. For sleep (Patient taking differently: Take 400 mg by mouth at bedtime. For sleep)   Venlafaxine HCl 225 MG TB24 Take 1 tablet by mouth daily.   Venlafaxine HCl 225 MG TB24 Take 1 tablet by mouth daily.   colestipol (COLESTID) 1 g tablet Take 2 tablets (2 g total) by mouth 3 (three) times daily.   nicotine (NICODERM CQ - DOSED IN MG/24 HOURS) 14 mg/24hr patch Place onto the skin.   rOPINIRole (REQUIP) 0.5 MG tablet    [DISCONTINUED] ADVAIR HFA 230-21 MCG/ACT inhaler INHALE 2 PUFFS INTO THE LUNGS TWICE DAILY. RINSE MOUTH WITH WATER AFTER USE   Facility-Administered Encounter Medications as of 09/27/2023  Medication   lidocaine (XYLOCAINE) 1 % (with pres) injection 10 mL   lidocaine HCl (PF) (XYLOCAINE) 2 % injection 4 mL    Past Medical History:  Diagnosis Date   Abdominal pain    Anemia    Anxiety    Bronchitis    COPD (chronic obstructive pulmonary disease) (HCC)    Depression    bipolar   Diarrhea    Emphysema    GERD (gastroesophageal reflux disease)    History of blood transfusion    MDS (myelodysplastic syndrome) (HCC)    Splenomegaly    r/t low platelets   Thrombocytopenia (HCC)    Tobacco abuse     Past Surgical History:  Procedure Laterality Date   ABDOMINAL HYSTERECTOMY     BONE MARROW TRANSPLANT     c section x 3      CHOLECYSTECTOMY     GASTRIC BYPASS     UPPER GASTROINTESTINAL ENDOSCOPY     WISDOM TOOTH EXTRACTION      Family History  Problem Relation Age of Onset   Diabetes Mother    Heart disease Father    Diabetes Other    Hypertension Other    CAD Other    Colon cancer Neg Hx    Rectal  cancer Neg Hx    Stomach cancer Neg Hx    Esophageal cancer Neg Hx     Social History   Socioeconomic History   Marital status: Married    Spouse name: Not on file   Number of children: 3   Years of education: Not on file   Highest education  level: Some college, no degree  Occupational History   Occupation: disabled  Tobacco Use   Smoking status: Some Days    Current packs/day: 0.00    Average packs/day: 0.3 packs/day for 30.0 years (7.5 ttl pk-yrs)    Types: Cigarettes    Start date: 02/01/1991    Last attempt to quit: 01/31/2021    Years since quitting: 2.6   Smokeless tobacco: Never   Tobacco comments:    5 cigs a day  Vaping Use   Vaping status: Never Used  Substance and Sexual Activity   Alcohol use: Not Currently    Comment: occasional wine   Drug use: No   Sexual activity: Not Currently    Birth control/protection: None    Comment: Hysterectomy  Other Topics Concern   Not on file  Social History Narrative   Not on file   Social Drivers of Health   Financial Resource Strain: Low Risk  (09/23/2023)   Overall Financial Resource Strain (CARDIA)    Difficulty of Paying Living Expenses: Not very hard  Food Insecurity: Food Insecurity Present (09/23/2023)   Hunger Vital Sign    Worried About Running Out of Food in the Last Year: Sometimes true    Ran Out of Food in the Last Year: Never true  Transportation Needs: No Transportation Needs (09/23/2023)   PRAPARE - Administrator, Civil Service (Medical): No    Lack of Transportation (Non-Medical): No  Physical Activity: Sufficiently Active (09/23/2023)   Exercise Vital Sign    Days of Exercise per Week: 5 days    Minutes of Exercise per Session: 30 min  Stress: No Stress Concern Present (09/23/2023)   [employer] of Occupational Health - Occupational Stress Questionnaire    Feeling of Stress : Only a little  Social Connections: Moderately Integrated (09/23/2023)   Social Connection and Isolation  Panel [NHANES]    Frequency of Communication with Friends and Family: More than three times a week    Frequency of Social Gatherings with Friends and Family: Once a week    Attends Religious Services: More than 4 times per year    Active Member of [employer] or Organizations: No    Attends Engineer, structural: Not on file    Marital Status: Married  Catering manager Violence: Not on file    Review of Systems  All other systems reviewed and are negative.       Objective    BP 111/70 (BP Location: Right Arm, Patient Position: Sitting, Cuff Size: Normal)   Pulse 82   Temp 98.9 F (37.2 C) (Oral)   Resp 18   Ht 5\' 6"  (1.676 m)   Wt 162 lb 9.6 oz (73.8 kg)   LMP  (LMP Unknown)   SpO2 92%   BMI 26.24 kg/m   Physical Exam Vitals and nursing note reviewed.  Constitutional:      General: She is not in acute distress. HENT:     Head: Normocephalic and atraumatic.     Right Ear: Tympanic membrane, ear canal and external ear normal.     Left Ear: Tympanic membrane, ear canal and external ear normal.     Nose: Nose normal.     Mouth/Throat:     Mouth: Mucous membranes are moist.     Pharynx: Oropharynx is clear.  Eyes:     Conjunctiva/sclera: Conjunctivae normal.     Pupils: Pupils are equal, round, and reactive to light.  Neck:     Thyroid:  No thyromegaly.  Cardiovascular:     Rate and Rhythm: Normal rate and regular rhythm.     Heart sounds: Normal heart sounds. No murmur heard. Pulmonary:     Effort: Pulmonary effort is normal. No respiratory distress.     Breath sounds: Normal breath sounds.  Abdominal:     General: There is no distension.     Palpations: Abdomen is soft. There is no mass.     Tenderness: There is no abdominal tenderness.  Musculoskeletal:        General: Normal range of motion.     Cervical back: Normal range of motion and neck supple.  Skin:    General: Skin is warm and dry.  Neurological:     General: No focal deficit present.      Mental Status: She is alert and oriented to person, place, and time.  Psychiatric:        Mood and Affect: Mood normal.        Behavior: Behavior normal.         Assessment & Plan:   Annual physical exam -     CMP14+EGFR  Screening for deficiency anemia -     CBC with Differential/Platelet  Screening for lipid disorders -     Lipid panel  Screening for endocrine/metabolic/immunity disorders -     VITAMIN D 25 Hydroxy (Vit-D Deficiency, Fractures) -     Hemoglobin A1c -     TSH  Encounter for screening mammogram for malignant neoplasm of breast -     3D Screening Mammogram, Left and Right; Future     No follow-ups on file.   Tommie Raymond, MD

## 2023-10-02 ENCOUNTER — Telehealth: Payer: Self-pay | Admitting: Family Medicine

## 2023-10-02 ENCOUNTER — Telehealth: Payer: Self-pay

## 2023-10-02 NOTE — Telephone Encounter (Signed)
I placed paperwork on provider desk in folder to be filled out.

## 2023-10-02 NOTE — Telephone Encounter (Signed)
Pt called inquiring about why she has not received her incontinence supplies yet

## 2023-10-02 NOTE — Telephone Encounter (Signed)
Copied from CRM [id number]. Topic: Referral - Status >> Oct 02, 2023  2:44 PM Dennison Nancy wrote: Reason for CRM:  Madison calling from [facility] on status of fax sent on 09/11/23 for continence supplies for patient . Will have nurse call back regarding the supplies  Call back Madison at the [facility] (she is the new patient dept. [phone] . Can not  ship supplies until taken care of  Spoke to Lorraine, I looked in the file cabinet I could not find the copy that we faxed it, I advised her to refax it and I will take care of it. -CB

## 2023-10-02 NOTE — Telephone Encounter (Signed)
Patient dropped off document  [facility] request for prior approval , to be filled out by provider. Patient requested to send it back via Fax within 5-days. Document is located in providers tray at front office.Please fax at  [phone]

## 2023-10-04 ENCOUNTER — Telehealth: Payer: Self-pay | Admitting: Family Medicine

## 2023-10-04 NOTE — Telephone Encounter (Signed)
Copied from CRM [id number]. Topic: Referral - Status >> Oct 04, 2023 12:49 PM Fonda Kinder J wrote: Reason for CRM: Pt called in to check on the status of referral for supplies, she is requesting a callback to get an update on if it was completed or not at [phone]. There were no notes after 12/16 for this encounter

## 2023-10-04 NOTE — Telephone Encounter (Signed)
Form printed and put in provider forms baset. Please call pt with update

## 2023-10-05 NOTE — Telephone Encounter (Signed)
Done KH 

## 2023-10-05 NOTE — Telephone Encounter (Addendum)
Pt calling back b/c she gave them wrong fax at first. For incontinence supplies.  She needs asap.  She would appreciate a message when this is done. Hopes you got the fax.

## 2023-10-05 NOTE — Telephone Encounter (Signed)
Not our pt. Form faxed back. KH

## 2023-10-05 NOTE — Telephone Encounter (Signed)
Called patient to inform paper is in providers box and when its finished being signed it will be faxed out.

## 2023-10-06 ENCOUNTER — Ambulatory Visit
Admission: RE | Admit: 2023-10-06 | Discharge: 2023-10-06 | Disposition: A | Discharge status: Home / Self Care | Payer: MEDICAID | Source: Ambulatory Visit | Attending: Acute Care | Admitting: Acute Care

## 2023-10-06 DIAGNOSIS — F17210 Nicotine dependence, cigarettes, uncomplicated: Secondary | ICD-10-CM

## 2023-10-06 DIAGNOSIS — Z87891 Personal history of nicotine dependence: Secondary | ICD-10-CM

## 2023-10-16 NOTE — Telephone Encounter (Signed)
Pt called to follow up on fax request, please advise. Pt wants to know if paperwork has been faxed.

## 2023-10-25 ENCOUNTER — Telehealth: Payer: Self-pay | Admitting: Acute Care

## 2023-10-25 NOTE — Telephone Encounter (Signed)
 I have called the patient with the results of her low-dose screening CT.  I explained that she has developed multiple bilateral small nodules that are consistent with infectious or inflammatory disease.  I asked the patient if she has been sick and she states that she has.  She was diagnosed with RSV yesterday 10/24/2023.  She is being [facility] [place].  She was prescribed a cough syrup and also Rivararin. She will follow up with her oncology MD. She does have a follow up CT chest ordered through her PCP. We will do a 6 month follow up LDCT which will be due 03/2024.  Please place order for 6 month follow up LDCT chest. Thanks so much

## 2023-10-26 ENCOUNTER — Other Ambulatory Visit: Payer: Self-pay

## 2023-10-26 DIAGNOSIS — Z87891 Personal history of nicotine dependence: Secondary | ICD-10-CM

## 2023-10-26 DIAGNOSIS — R911 Solitary pulmonary nodule: Secondary | ICD-10-CM

## 2023-10-26 DIAGNOSIS — F17210 Nicotine dependence, cigarettes, uncomplicated: Secondary | ICD-10-CM

## 2023-10-26 DIAGNOSIS — Z122 Encounter for screening for malignant neoplasm of respiratory organs: Secondary | ICD-10-CM

## 2023-10-26 NOTE — Telephone Encounter (Signed)
 6 Month Nodule F/U CT order placed.

## 2023-11-09 ENCOUNTER — Telehealth: Payer: Self-pay | Admitting: Family Medicine

## 2023-11-09 NOTE — Telephone Encounter (Signed)
 [name]

## 2023-12-04 ENCOUNTER — Ambulatory Visit
Admission: RE | Admit: 2023-12-04 | Discharge: 2023-12-04 | Disposition: A | Discharge status: Home / Self Care | Payer: MEDICAID | Source: Ambulatory Visit | Attending: Family Medicine | Admitting: Family Medicine

## 2023-12-04 DIAGNOSIS — Z1231 Encounter for screening mammogram for malignant neoplasm of breast: Secondary | ICD-10-CM

## 2024-04-09 ENCOUNTER — Ambulatory Visit

## 2024-09-17 ENCOUNTER — Encounter: Payer: Self-pay | Admitting: Acute Care
